# Patient Record
Sex: Female | Born: 1960 | Race: White | Hispanic: No | Marital: Married | Smoking: Current every day smoker
Health system: Southern US, Community
[De-identification: ages and names within clinical notes are randomized; demographics above are authoritative.]

## PROBLEM LIST (undated history)

## (undated) ENCOUNTER — Emergency Department (HOSPITAL_COMMUNITY): Payer: PRIVATE HEALTH INSURANCE | Source: Home / Self Care

## (undated) DIAGNOSIS — K219 Gastro-esophageal reflux disease without esophagitis: Secondary | ICD-10-CM

## (undated) DIAGNOSIS — T7840XA Allergy, unspecified, initial encounter: Secondary | ICD-10-CM

## (undated) DIAGNOSIS — J45909 Unspecified asthma, uncomplicated: Secondary | ICD-10-CM

## (undated) DIAGNOSIS — I890 Lymphedema, not elsewhere classified: Secondary | ICD-10-CM

## (undated) DIAGNOSIS — I10 Essential (primary) hypertension: Secondary | ICD-10-CM

## (undated) DIAGNOSIS — I96 Gangrene, not elsewhere classified: Secondary | ICD-10-CM

## (undated) DIAGNOSIS — C50919 Malignant neoplasm of unspecified site of unspecified female breast: Secondary | ICD-10-CM

## (undated) DIAGNOSIS — M8580 Other specified disorders of bone density and structure, unspecified site: Secondary | ICD-10-CM

## (undated) DIAGNOSIS — C7951 Secondary malignant neoplasm of bone: Secondary | ICD-10-CM

## (undated) HISTORY — PX: OTHER SURGICAL HISTORY: SHX169

## (undated) HISTORY — DX: Other specified disorders of bone density and structure, unspecified site: M85.80

## (undated) HISTORY — DX: Malignant neoplasm of unspecified site of unspecified female breast: C50.919

## (undated) HISTORY — DX: Essential (primary) hypertension: I10

## (undated) HISTORY — DX: Allergy, unspecified, initial encounter: T78.40XA

## (undated) HISTORY — DX: Gastro-esophageal reflux disease without esophagitis: K21.9

## (undated) HISTORY — DX: Lymphedema, not elsewhere classified: I89.0

## (undated) HISTORY — DX: Secondary malignant neoplasm of bone: C79.51

## (undated) HISTORY — PX: TUBAL LIGATION: SHX77

## (undated) HISTORY — PX: MOUTH SURGERY: SHX715

## (undated) HISTORY — DX: Gangrene, not elsewhere classified: I96

## (undated) HISTORY — DX: Unspecified asthma, uncomplicated: J45.909

## (undated) HISTORY — PX: WISDOM TOOTH EXTRACTION: SHX21

---

## 1996-12-30 HISTORY — PX: TUBAL LIGATION: SHX77

## 1998-12-04 ENCOUNTER — Ambulatory Visit (HOSPITAL_BASED_OUTPATIENT_CLINIC_OR_DEPARTMENT_OTHER): Admission: RE | Admit: 1998-12-04 | Discharge: 1998-12-04 | Payer: Self-pay | Admitting: Oral Surgery

## 2002-10-20 ENCOUNTER — Ambulatory Visit (HOSPITAL_COMMUNITY): Admission: RE | Admit: 2002-10-20 | Discharge: 2002-10-20 | Payer: Self-pay | Admitting: Pulmonary Disease

## 2003-03-10 ENCOUNTER — Ambulatory Visit (HOSPITAL_COMMUNITY): Admission: RE | Admit: 2003-03-10 | Discharge: 2003-03-10 | Payer: Self-pay | Admitting: Obstetrics & Gynecology

## 2004-12-30 HISTORY — PX: CARPAL TUNNEL RELEASE: SHX101

## 2011-02-21 ENCOUNTER — Other Ambulatory Visit: Payer: Self-pay

## 2011-02-21 DIAGNOSIS — Z139 Encounter for screening, unspecified: Secondary | ICD-10-CM

## 2011-03-07 ENCOUNTER — Ambulatory Visit (HOSPITAL_COMMUNITY)
Admission: RE | Admit: 2011-03-07 | Discharge: 2011-03-07 | Disposition: A | Discharge status: Home / Self Care | Payer: PRIVATE HEALTH INSURANCE | Source: Ambulatory Visit | Attending: Family Medicine | Admitting: Family Medicine

## 2011-03-07 DIAGNOSIS — Z139 Encounter for screening, unspecified: Secondary | ICD-10-CM

## 2011-03-07 DIAGNOSIS — Z1231 Encounter for screening mammogram for malignant neoplasm of breast: Secondary | ICD-10-CM | POA: Insufficient documentation

## 2012-06-08 ENCOUNTER — Encounter (HOSPITAL_COMMUNITY): Payer: Self-pay

## 2012-06-08 ENCOUNTER — Emergency Department (HOSPITAL_COMMUNITY)
Admission: EM | Admit: 2012-06-08 | Discharge: 2012-06-08 | Disposition: A | Discharge status: Home / Self Care | Payer: Managed Care, Other (non HMO) | Attending: Emergency Medicine | Admitting: Emergency Medicine

## 2012-06-08 DIAGNOSIS — F17200 Nicotine dependence, unspecified, uncomplicated: Secondary | ICD-10-CM | POA: Insufficient documentation

## 2012-06-08 DIAGNOSIS — G44209 Tension-type headache, unspecified, not intractable: Secondary | ICD-10-CM

## 2012-06-08 DIAGNOSIS — J45909 Unspecified asthma, uncomplicated: Secondary | ICD-10-CM | POA: Insufficient documentation

## 2012-06-08 DIAGNOSIS — R51 Headache: Secondary | ICD-10-CM | POA: Insufficient documentation

## 2012-06-08 DIAGNOSIS — I10 Essential (primary) hypertension: Secondary | ICD-10-CM | POA: Insufficient documentation

## 2012-06-08 HISTORY — DX: Unspecified asthma, uncomplicated: J45.909

## 2012-06-08 HISTORY — DX: Essential (primary) hypertension: I10

## 2012-06-08 LAB — BASIC METABOLIC PANEL
BUN: 13 mg/dL (ref 6–23)
CO2: 26 mEq/L (ref 19–32)
Calcium: 10.7 mg/dL — ABNORMAL HIGH (ref 8.4–10.5)
Chloride: 103 mEq/L (ref 96–112)
Creatinine, Ser: 0.68 mg/dL (ref 0.50–1.10)
GFR calc Af Amer: >90 mL/min (ref >90)
GFR calc non Af Amer: >90 mL/min (ref >90)
Glucose, Bld: 152 mg/dL — ABNORMAL HIGH (ref 70–99)
Potassium: 3.4 mEq/L — ABNORMAL LOW (ref 3.5–5.1)
Sodium: 142 mEq/L (ref 135–145)

## 2012-06-08 MED ORDER — HYDROCODONE-ACETAMINOPHEN 5-325 MG PO TABS: 2.0000 | ORAL_TABLET | ORAL | Status: AC | PRN

## 2012-06-08 NOTE — ED Provider Notes (Signed)
History  This chart was scribed for Wendy Baker, MD by Bennett Scrape. This patient was seen in room APA07/APA07 and the patient's care was started at 12:25PM.  CSN: [id number]  Arrival date & time 06/08/12  1201   First MD Initiated Contact with Patient 06/08/12 1225      Chief Complaint  Patient presents with  . Hypertension  . Headache    The history is provided by the patient. No language interpreter was used.    Wendy Zamora Ward is a 51 y.o. female with a h/o HTN who presents to the Emergency Department complaining of 2 weeks of gradual onset, gradually worsening, intermittently felt HTN symptoms described as HA that starts as neck pain and radiates up into the occiput with occasional associated blurred and spotty vision. She states that she has measured her BP in the 150s over 90s for the past 2 weeks. Pt states that the HA has been coming and going and is related to her BP being high. 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 She reports that the symptoms have been the most severe over the past 3 days which is why she came to this ED for evaluation today. She states that she has an appointment to see her PCP tomorrow. She is taking hydrochlorothiazide for HTN. She denies changes in medications or increased stress. She reports that she is trying to live a healthier lifestyle and has recently lost 22 lbs. She also has a h/o asthma. She is a current everyday smoker but denies alcohol use.  PCP is [facility].  Past Medical History  Diagnosis Date  . Hypertension   . Asthma     Past Surgical History  Procedure Date  . Tubal ligation   . Mouth surgery     No family history on file.  History  Substance Use Topics  . Smoking status: Current Everyday Smoker  . Smokeless tobacco: Not on file  . Alcohol Use: No    Review of Systems  Constitutional: Negative for fever.       10 Systems reviewed and are negative for acute change  except as noted in the HPI.  HENT: Positive for neck pain. Negative for rhinorrhea.   Eyes: Negative for discharge and redness.  Respiratory: Negative for cough and shortness of breath.   Cardiovascular: Negative for chest pain.  Gastrointestinal: Negative for vomiting, abdominal pain and diarrhea.  Genitourinary: Negative for dysuria.  Musculoskeletal: Negative for back pain.  Skin: Negative for rash.  Neurological: Positive for headaches. Negative for weakness and numbness.    Allergies  Ceftin; Lisinopril; Metoprolol; and Sulfa antibiotics  Home Medications  No current outpatient prescriptions on file.  Triage Vitals: BP 148/86  Pulse 94  Temp(Src) 98.2 F (36.8 C) (Oral)  Resp 18  Ht 5' 4.5" (1.638 m)  Wt 178 lb (80.74 kg)  BMI 30.08 kg/m2  SpO2 99%  LMP 06/08/2012  Physical Exam  Nursing note and vitals reviewed. Constitutional: She is oriented to person, place, and time. She appears well-developed and well-nourished. No distress.  HENT:  Head: Normocephalic and atraumatic.  Eyes: EOM are normal. Pupils are equal, round, and reactive to light.  Neck: Neck supple. No tracheal deviation present.  Cardiovascular: Normal rate.   Pulmonary/Chest: Effort normal. No respiratory distress.  Abdominal: Soft. She exhibits no distension.  Musculoskeletal: Normal range of motion. She exhibits no edema.  Neurological: She is alert and oriented to person, place, and  time. No sensory deficit.  Skin: Skin is warm and dry.  Psychiatric: She has a normal mood and affect. Her behavior is normal.    ED Course  Procedures (including critical care time)  DIAGNOSTIC STUDIES: Oxygen Saturation is 99% on room air, normal by my interpretation.    COORDINATION OF CARE: 12:31PM-Informed pt that BP is within normal range. Discussed treatment plan of blood work with pt and pt agreed to plan. Advised pt to follow up with PCP tomorrow for HTN symptoms.    Labs Reviewed - No data to  display No results found.   No diagnosis found.    MDM  Patient has appointment to be seen by her Dr. tomorrow for adjustment of her hypertensive medications. No concern for intracerebral hemorrhage at this time. No evidence of hypertensive emergency  I personally performed the services described in this documentation, which was scribed in my presence. The recorded information has been reviewed and considered.        Wendy Baker, MD 06/08/12 1346

## 2012-06-08 NOTE — Discharge Instructions (Signed)
Arterial Hypertension Arterial hypertension (high blood pressure) is a condition of elevated pressure in your blood vessels. Hypertension over a long period of time is a risk factor for strokes, heart attacks, and heart failure. It is also the leading cause of kidney (renal) failure.  CAUSES   In Adults -- Over 90% of all hypertension has no known cause. This is called essential or primary hypertension. In the other 10% of people with hypertension, the increase in blood pressure is caused by another disorder. This is called secondary hypertension. Important causes of secondary hypertension are:   Heavy alcohol use.   Obstructive sleep apnea.   Hyperaldosterosim (Conn's syndrome).   Steroid use.   Chronic kidney failure.   Hyperparathyroidism.   Medications.   Renal artery stenosis.   Pheochromocytoma.   Cushing's disease.   Coarctation of the aorta.   Scleroderma renal crisis.   Licorice (in excessive amounts).   Drugs (cocaine, methamphetamine).  Your caregiver can explain any items above that apply to you.  In Children -- Secondary hypertension is more common and should always be considered.   Pregnancy -- Few women of childbearing age have high blood pressure. However, up to 10% of them develop hypertension of pregnancy. Generally, this will not harm the woman. It Even be a sign of 3 complications of pregnancy: preeclampsia, HELLP syndrome, and eclampsia. Follow up and control with medication is necessary.  SYMPTOMS   This condition normally does not produce any noticeable symptoms. It is usually found during a routine exam.   Malignant hypertension is a late problem of high blood pressure. It Monger have the following symptoms:   Headaches.   Blurred vision.   End-organ damage (this means your kidneys, heart, lungs, and other organs are being damaged).   Stressful situations can increase the blood pressure. If a person with normal blood pressure has their blood  pressure go up while being seen by their caregiver, this is often termed "white coat hypertension." Its importance is not known. It Alkire be related with eventually developing hypertension or complications of hypertension.   Hypertension is often confused with mental tension, stress, and anxiety.  DIAGNOSIS  The diagnosis is made by 3 separate blood pressure measurements. They are taken at least 1 week apart from each other. If there is organ damage from hypertension, the diagnosis Lemire be made without repeat measurements. Hypertension is usually identified by having blood pressure readings:  Above 140/90 mmHg measured in both arms, at 3 separate times, over a couple weeks.   Over 130/80 mmHg should be considered a risk factor and Grisanti require treatment in patients with diabetes.  Blood pressure readings over 120/80 mmHg are called "pre-hypertension" even in non-diabetic patients. To get a true blood pressure measurement, use the following guidelines. Be aware of the factors that can alter blood pressure readings.  Take measurements at least 1 hour after caffeine.   Take measurements 30 minutes after smoking and without any stress. This is another reason to quit smoking - it raises your blood pressure.   Use a proper cuff size. Ask your caregiver if you are not sure about your cuff size.   Most home blood pressure cuffs are automatic. They will measure systolic and diastolic pressures. The systolic pressure is the pressure reading at the start of sounds. Diastolic pressure is the pressure at which the sounds disappear. If you are elderly, measure pressures in multiple postures. Try sitting, lying or standing.   Sit at rest for a minimum of   5 minutes before taking measurements.   You should not be on any medications like decongestants. These are found in many cold medications.   Record your blood pressure readings and review them with your caregiver.  If you have hypertension:  Your caregiver  may do tests to be sure you do not have secondary hypertension (see "causes" above).   Your caregiver may also look for signs of metabolic syndrome. This is also called Syndrome X or Insulin Resistance Syndrome. You may have this syndrome if you have type 2 diabetes, abdominal obesity, and abnormal blood lipids in addition to hypertension.   Your caregiver will take your medical and family history and perform a physical exam.   Diagnostic tests may include blood tests (for glucose, cholesterol, potassium, and kidney function), a urinalysis, or an EKG. Other tests may also be necessary depending on your condition.  PREVENTION  There are important lifestyle issues that you can adopt to reduce your chance of developing hypertension:  Maintain a normal weight.   Limit the amount of salt (sodium) in your diet.   Exercise often.   Limit alcohol intake.   Get enough potassium in your diet. Discuss specific advice with your caregiver.   Follow a DASH diet (dietary approaches to stop hypertension). This diet is rich in fruits, vegetables, and low-fat dairy products, and avoids certain fats.  PROGNOSIS  Essential hypertension cannot be cured. Lifestyle changes and medical treatment can lower blood pressure and reduce complications. The prognosis of secondary hypertension depends on the underlying cause. Many people whose hypertension is controlled with medicine or lifestyle changes can live a normal, healthy life.  RISKS AND COMPLICATIONS  While high blood pressure alone is not an illness, it often requires treatment due to its short- and long-term effects on many organs. Hypertension increases your risk for:  CVAs or strokes (cerebrovascular accident).   Heart failure due to chronically high blood pressure (hypertensive cardiomyopathy).   Heart attack (myocardial infarction).   Damage to the retina (hypertensive retinopathy).   Kidney failure (hypertensive nephropathy).  Your caregiver can  explain list items above that apply to you. Treatment of hypertension can significantly reduce the risk of complications. TREATMENT   For overweight patients, weight loss and regular exercise are recommended. Physical fitness lowers blood pressure.   Mild hypertension is usually treated with diet and exercise. A diet rich in fruits and vegetables, fat-free dairy products, and foods low in fat and salt (sodium) can help lower blood pressure. Decreasing salt intake decreases blood pressure in a 1/3 of people.   Stop smoking if you are a smoker.  The steps above are highly effective in reducing blood pressure. While these actions are easy to suggest, they are difficult to achieve. Most patients with moderate or severe hypertension end up requiring medications to bring their blood pressure down to a normal level. There are several classes of medications for treatment. Blood pressure pills (antihypertensives) will lower blood pressure by their different actions. Lowering the blood pressure by 10 mmHg may decrease the risk of complications by as much as 25%. The goal of treatment is effective blood pressure control. This will reduce your risk for complications. Your caregiver will help you determine the best treatment for you according to your lifestyle. What is excellent treatment for one person, may not be for you. HOME CARE INSTRUCTIONS   Do not smoke.   Follow the lifestyle changes outlined in the "Prevention" section.   If you are on medications, follow the directions   carefully. Blood pressure medications must be taken as prescribed. Skipping doses reduces their benefit. It also puts you at risk for problems.   Follow up with your caregiver, as directed.   If you are asked to monitor your blood pressure at home, follow the guidelines in the "Diagnosis" section above.  SEEK MEDICAL CARE IF:   You think you are having medication side effects.   You have recurrent headaches or lightheadedness.     You have swelling in your ankles.   You have trouble with your vision.  SEEK IMMEDIATE MEDICAL CARE IF:   You have sudden onset of chest pain or pressure, difficulty breathing, or other symptoms of a heart attack.   You have a severe headache.   You have symptoms of a stroke (such as sudden weakness, difficulty speaking, difficulty walking).  MAKE SURE YOU:   Understand these instructions.   Will watch your condition.   Will get help right away if you are not doing well or get worse.  Document Released: 12/16/2005 Document Revised: 12/05/2011 Document Reviewed: 07/16/2007 [facility] Patient Information 2012 [place], [place].Tension Headache (Muscle Contraction Headache) Tension headache is one of the most common causes of head pain. These headaches are usually felt as a pain over the top of your head and back of your neck. Stress, anxiety, and depression are common triggers for these headaches. Tension headaches are not life-threatening and will not lead to other types of headaches. Tension headaches can often be diagnosed by taking a history from the patient and a physical exam. Sometimes, further lab and x-ray studies are used to confirm the diagnosis. Your caregiver can advise you on how to get help solving problems that cause anxiety or stress. Antidepressants can be prescribed if depression is a problem. HOME CARE INSTRUCTIONS   If testing was done, call for your results. Remember, it is your responsibility to get the results of all testing. Do not assume everything is fine because you do not hear from your caregiver.   Only take over-the-counter or prescription medicines for pain, discomfort, or fever as directed by your caregiver.   Biofeedback, massage, or other relaxation techniques may be helpful.   Ice packs or heat to the head and neck can be used. Use these three to four times per day or as needed.   Physical therapy may be a useful addition to treatment.   If headaches  continue, even with therapy, you may need to think about lifestyle changes.   Avoid excessive use of pain killers, as rebound headaches can occur.  SEEK MEDICAL CARE IF:   You develop problems with medications prescribed.   You do not respond or get no relief from medications.   You have a change from the usual headache.   You develop nausea (feeling sick to your stomach) or vomiting.  SEEK IMMEDIATE MEDICAL CARE IF:   Your headache becomes severe.   You have an unexplained oral temperature above 102 F (38.9 C).   You develop a stiff neck.   You have loss of vision.   You have muscular weakness.   You have loss of muscular control.   You develop severe symptoms different from your first symptoms.   You start losing your balance or have trouble walking.   You feel faint or pass out.  MAKE SURE YOU:   Understand these instructions.   Will watch your condition.   Will get help right away if you are not doing well or get worse.  Document  Released: 12/16/2005 Document Revised: 12/05/2011 Document Reviewed: 08/04/2008 [facility] Patient Information 2012 [place], [place].

## 2012-06-08 NOTE — ED Notes (Signed)
Pt c/o elevated B/P for a couple weeks. No complaints at this time. Sitting in chair in room in no acute distress.

## 2012-06-08 NOTE — ED Notes (Signed)
Pt reports bp has been elveated for the past couple of weeks.  Reports has been taking her medication as directed.  C/o headache x 2weeks.

## 2019-04-08 DIAGNOSIS — N6321 Unspecified lump in the left breast, upper outer quadrant: Secondary | ICD-10-CM | POA: Diagnosis not present

## 2019-04-08 DIAGNOSIS — Z803 Family history of malignant neoplasm of breast: Secondary | ICD-10-CM | POA: Diagnosis not present

## 2019-04-14 DIAGNOSIS — R590 Localized enlarged lymph nodes: Secondary | ICD-10-CM | POA: Diagnosis not present

## 2019-04-14 DIAGNOSIS — Z803 Family history of malignant neoplasm of breast: Secondary | ICD-10-CM | POA: Diagnosis not present

## 2019-04-14 DIAGNOSIS — R921 Mammographic calcification found on diagnostic imaging of breast: Secondary | ICD-10-CM | POA: Diagnosis not present

## 2019-04-14 DIAGNOSIS — R928 Other abnormal and inconclusive findings on diagnostic imaging of breast: Secondary | ICD-10-CM | POA: Diagnosis not present

## 2019-04-14 DIAGNOSIS — L988 Other specified disorders of the skin and subcutaneous tissue: Secondary | ICD-10-CM | POA: Diagnosis not present

## 2019-04-22 DIAGNOSIS — Z01812 Encounter for preprocedural laboratory examination: Secondary | ICD-10-CM | POA: Diagnosis not present

## 2019-04-22 DIAGNOSIS — Z01818 Encounter for other preprocedural examination: Secondary | ICD-10-CM | POA: Diagnosis not present

## 2019-04-22 DIAGNOSIS — C773 Secondary and unspecified malignant neoplasm of axilla and upper limb lymph nodes: Secondary | ICD-10-CM | POA: Diagnosis not present

## 2019-04-22 DIAGNOSIS — J984 Other disorders of lung: Secondary | ICD-10-CM | POA: Diagnosis not present

## 2019-04-22 DIAGNOSIS — C50412 Malignant neoplasm of upper-outer quadrant of left female breast: Secondary | ICD-10-CM | POA: Diagnosis not present

## 2019-04-22 DIAGNOSIS — C50919 Malignant neoplasm of unspecified site of unspecified female breast: Secondary | ICD-10-CM | POA: Diagnosis not present

## 2019-04-23 DIAGNOSIS — C50412 Malignant neoplasm of upper-outer quadrant of left female breast: Secondary | ICD-10-CM | POA: Diagnosis not present

## 2019-04-23 DIAGNOSIS — C773 Secondary and unspecified malignant neoplasm of axilla and upper limb lymph nodes: Secondary | ICD-10-CM | POA: Diagnosis not present

## 2019-04-23 DIAGNOSIS — Z803 Family history of malignant neoplasm of breast: Secondary | ICD-10-CM | POA: Diagnosis not present

## 2019-04-26 DIAGNOSIS — C50912 Malignant neoplasm of unspecified site of left female breast: Secondary | ICD-10-CM | POA: Diagnosis not present

## 2019-04-27 DIAGNOSIS — J8410 Pulmonary fibrosis, unspecified: Secondary | ICD-10-CM | POA: Diagnosis not present

## 2019-04-27 DIAGNOSIS — C50919 Malignant neoplasm of unspecified site of unspecified female breast: Secondary | ICD-10-CM | POA: Diagnosis not present

## 2019-04-27 DIAGNOSIS — C50912 Malignant neoplasm of unspecified site of left female breast: Secondary | ICD-10-CM | POA: Diagnosis not present

## 2019-04-28 DIAGNOSIS — Z79899 Other long term (current) drug therapy: Secondary | ICD-10-CM | POA: Diagnosis not present

## 2019-04-28 DIAGNOSIS — Z5181 Encounter for therapeutic drug level monitoring: Secondary | ICD-10-CM | POA: Diagnosis not present

## 2019-04-28 DIAGNOSIS — C50919 Malignant neoplasm of unspecified site of unspecified female breast: Secondary | ICD-10-CM | POA: Diagnosis not present

## 2019-04-30 DIAGNOSIS — C50912 Malignant neoplasm of unspecified site of left female breast: Secondary | ICD-10-CM | POA: Diagnosis not present

## 2019-05-05 DIAGNOSIS — Z452 Encounter for adjustment and management of vascular access device: Secondary | ICD-10-CM | POA: Diagnosis not present

## 2019-05-13 DIAGNOSIS — C50912 Malignant neoplasm of unspecified site of left female breast: Secondary | ICD-10-CM | POA: Diagnosis not present

## 2019-05-13 DIAGNOSIS — R000 Tachycardia, unspecified: Secondary | ICD-10-CM | POA: Diagnosis not present

## 2019-05-13 DIAGNOSIS — R0602 Shortness of breath: Secondary | ICD-10-CM | POA: Diagnosis not present

## 2019-05-19 DIAGNOSIS — C50912 Malignant neoplasm of unspecified site of left female breast: Secondary | ICD-10-CM | POA: Diagnosis not present

## 2019-06-02 DIAGNOSIS — C50912 Malignant neoplasm of unspecified site of left female breast: Secondary | ICD-10-CM | POA: Diagnosis not present

## 2019-06-17 DIAGNOSIS — C50912 Malignant neoplasm of unspecified site of left female breast: Secondary | ICD-10-CM | POA: Diagnosis not present

## 2019-07-01 DIAGNOSIS — C50912 Malignant neoplasm of unspecified site of left female breast: Secondary | ICD-10-CM | POA: Diagnosis not present

## 2019-07-01 DIAGNOSIS — K1379 Other lesions of oral mucosa: Secondary | ICD-10-CM | POA: Diagnosis not present

## 2019-07-08 DIAGNOSIS — C50912 Malignant neoplasm of unspecified site of left female breast: Secondary | ICD-10-CM | POA: Diagnosis not present

## 2019-07-15 DIAGNOSIS — C50912 Malignant neoplasm of unspecified site of left female breast: Secondary | ICD-10-CM | POA: Diagnosis not present

## 2019-07-22 DIAGNOSIS — M62838 Other muscle spasm: Secondary | ICD-10-CM | POA: Diagnosis not present

## 2019-07-22 DIAGNOSIS — G4709 Other insomnia: Secondary | ICD-10-CM | POA: Diagnosis not present

## 2019-07-22 DIAGNOSIS — C50912 Malignant neoplasm of unspecified site of left female breast: Secondary | ICD-10-CM | POA: Diagnosis not present

## 2019-07-29 DIAGNOSIS — C50912 Malignant neoplasm of unspecified site of left female breast: Secondary | ICD-10-CM | POA: Diagnosis not present

## 2019-08-02 DIAGNOSIS — C50912 Malignant neoplasm of unspecified site of left female breast: Secondary | ICD-10-CM | POA: Diagnosis not present

## 2019-08-02 DIAGNOSIS — Z803 Family history of malignant neoplasm of breast: Secondary | ICD-10-CM | POA: Diagnosis not present

## 2019-08-05 DIAGNOSIS — C50912 Malignant neoplasm of unspecified site of left female breast: Secondary | ICD-10-CM | POA: Diagnosis not present

## 2019-08-05 DIAGNOSIS — Z5111 Encounter for antineoplastic chemotherapy: Secondary | ICD-10-CM | POA: Diagnosis not present

## 2019-08-12 DIAGNOSIS — M62838 Other muscle spasm: Secondary | ICD-10-CM | POA: Diagnosis not present

## 2019-08-12 DIAGNOSIS — Z5111 Encounter for antineoplastic chemotherapy: Secondary | ICD-10-CM | POA: Diagnosis not present

## 2019-08-12 DIAGNOSIS — C50912 Malignant neoplasm of unspecified site of left female breast: Secondary | ICD-10-CM | POA: Diagnosis not present

## 2019-08-19 DIAGNOSIS — Z5111 Encounter for antineoplastic chemotherapy: Secondary | ICD-10-CM | POA: Diagnosis not present

## 2019-08-19 DIAGNOSIS — C50912 Malignant neoplasm of unspecified site of left female breast: Secondary | ICD-10-CM | POA: Diagnosis not present

## 2019-08-26 DIAGNOSIS — Z5111 Encounter for antineoplastic chemotherapy: Secondary | ICD-10-CM | POA: Diagnosis not present

## 2019-08-26 DIAGNOSIS — C50912 Malignant neoplasm of unspecified site of left female breast: Secondary | ICD-10-CM | POA: Diagnosis not present

## 2019-09-02 DIAGNOSIS — C50912 Malignant neoplasm of unspecified site of left female breast: Secondary | ICD-10-CM | POA: Diagnosis not present

## 2019-09-08 DIAGNOSIS — C50412 Malignant neoplasm of upper-outer quadrant of left female breast: Secondary | ICD-10-CM | POA: Diagnosis not present

## 2019-09-08 DIAGNOSIS — Z803 Family history of malignant neoplasm of breast: Secondary | ICD-10-CM | POA: Diagnosis not present

## 2019-09-08 DIAGNOSIS — C773 Secondary and unspecified malignant neoplasm of axilla and upper limb lymph nodes: Secondary | ICD-10-CM | POA: Diagnosis not present

## 2019-09-08 DIAGNOSIS — I10 Essential (primary) hypertension: Secondary | ICD-10-CM | POA: Diagnosis not present

## 2019-09-08 DIAGNOSIS — C50912 Malignant neoplasm of unspecified site of left female breast: Secondary | ICD-10-CM | POA: Diagnosis not present

## 2019-09-08 DIAGNOSIS — C50812 Malignant neoplasm of overlapping sites of left female breast: Secondary | ICD-10-CM | POA: Diagnosis not present

## 2019-09-09 DIAGNOSIS — Z170 Estrogen receptor positive status [ER+]: Secondary | ICD-10-CM | POA: Diagnosis not present

## 2019-09-09 DIAGNOSIS — C50912 Malignant neoplasm of unspecified site of left female breast: Secondary | ICD-10-CM | POA: Diagnosis not present

## 2019-09-16 DIAGNOSIS — C50912 Malignant neoplasm of unspecified site of left female breast: Secondary | ICD-10-CM | POA: Diagnosis not present

## 2019-09-24 DIAGNOSIS — C50912 Malignant neoplasm of unspecified site of left female breast: Secondary | ICD-10-CM | POA: Diagnosis not present

## 2019-09-29 DIAGNOSIS — C50412 Malignant neoplasm of upper-outer quadrant of left female breast: Secondary | ICD-10-CM | POA: Diagnosis not present

## 2019-09-29 DIAGNOSIS — Z803 Family history of malignant neoplasm of breast: Secondary | ICD-10-CM | POA: Diagnosis not present

## 2019-09-29 DIAGNOSIS — C50812 Malignant neoplasm of overlapping sites of left female breast: Secondary | ICD-10-CM | POA: Diagnosis not present

## 2019-09-29 DIAGNOSIS — I10 Essential (primary) hypertension: Secondary | ICD-10-CM | POA: Diagnosis not present

## 2019-09-29 DIAGNOSIS — C773 Secondary and unspecified malignant neoplasm of axilla and upper limb lymph nodes: Secondary | ICD-10-CM | POA: Diagnosis not present

## 2019-10-08 DIAGNOSIS — C50912 Malignant neoplasm of unspecified site of left female breast: Secondary | ICD-10-CM | POA: Diagnosis not present

## 2019-10-09 DIAGNOSIS — Z23 Encounter for immunization: Secondary | ICD-10-CM | POA: Diagnosis not present

## 2019-10-14 DIAGNOSIS — R21 Rash and other nonspecific skin eruption: Secondary | ICD-10-CM | POA: Diagnosis not present

## 2019-10-14 DIAGNOSIS — G629 Polyneuropathy, unspecified: Secondary | ICD-10-CM | POA: Diagnosis not present

## 2019-10-14 DIAGNOSIS — Z452 Encounter for adjustment and management of vascular access device: Secondary | ICD-10-CM | POA: Diagnosis not present

## 2019-10-14 DIAGNOSIS — Z79899 Other long term (current) drug therapy: Secondary | ICD-10-CM | POA: Diagnosis not present

## 2019-10-14 DIAGNOSIS — C50912 Malignant neoplasm of unspecified site of left female breast: Secondary | ICD-10-CM | POA: Diagnosis not present

## 2019-10-14 DIAGNOSIS — R079 Chest pain, unspecified: Secondary | ICD-10-CM | POA: Diagnosis not present

## 2019-10-22 DIAGNOSIS — C50812 Malignant neoplasm of overlapping sites of left female breast: Secondary | ICD-10-CM | POA: Diagnosis not present

## 2019-10-22 DIAGNOSIS — C773 Secondary and unspecified malignant neoplasm of axilla and upper limb lymph nodes: Secondary | ICD-10-CM | POA: Diagnosis not present

## 2019-10-22 DIAGNOSIS — Z170 Estrogen receptor positive status [ER+]: Secondary | ICD-10-CM | POA: Diagnosis not present

## 2019-10-22 DIAGNOSIS — C50412 Malignant neoplasm of upper-outer quadrant of left female breast: Secondary | ICD-10-CM | POA: Diagnosis not present

## 2019-10-22 DIAGNOSIS — Z9013 Acquired absence of bilateral breasts and nipples: Secondary | ICD-10-CM

## 2019-10-22 DIAGNOSIS — Z9011 Acquired absence of right breast and nipple: Secondary | ICD-10-CM | POA: Diagnosis not present

## 2019-10-22 HISTORY — PX: OTHER SURGICAL HISTORY: SHX169

## 2019-10-22 HISTORY — DX: Acquired absence of bilateral breasts and nipples: Z90.13

## 2019-11-13 DIAGNOSIS — Z20828 Contact with and (suspected) exposure to other viral communicable diseases: Secondary | ICD-10-CM | POA: Diagnosis not present

## 2019-12-03 DIAGNOSIS — I96 Gangrene, not elsewhere classified: Secondary | ICD-10-CM | POA: Diagnosis not present

## 2019-12-08 DIAGNOSIS — C50912 Malignant neoplasm of unspecified site of left female breast: Secondary | ICD-10-CM | POA: Diagnosis not present

## 2019-12-09 DIAGNOSIS — C50912 Malignant neoplasm of unspecified site of left female breast: Secondary | ICD-10-CM | POA: Diagnosis not present

## 2019-12-10 DIAGNOSIS — T8579XA Infection and inflammatory reaction due to other internal prosthetic devices, implants and grafts, initial encounter: Secondary | ICD-10-CM | POA: Diagnosis not present

## 2019-12-17 DIAGNOSIS — Z853 Personal history of malignant neoplasm of breast: Secondary | ICD-10-CM | POA: Diagnosis not present

## 2019-12-17 DIAGNOSIS — Z0001 Encounter for general adult medical examination with abnormal findings: Secondary | ICD-10-CM | POA: Diagnosis not present

## 2019-12-17 DIAGNOSIS — I10 Essential (primary) hypertension: Secondary | ICD-10-CM | POA: Diagnosis not present

## 2020-01-05 DIAGNOSIS — Z9013 Acquired absence of bilateral breasts and nipples: Secondary | ICD-10-CM | POA: Insufficient documentation

## 2020-01-06 DIAGNOSIS — Z13820 Encounter for screening for osteoporosis: Secondary | ICD-10-CM | POA: Diagnosis not present

## 2020-01-06 DIAGNOSIS — M8588 Other specified disorders of bone density and structure, other site: Secondary | ICD-10-CM | POA: Diagnosis not present

## 2020-01-06 DIAGNOSIS — C50912 Malignant neoplasm of unspecified site of left female breast: Secondary | ICD-10-CM | POA: Diagnosis not present

## 2020-01-06 DIAGNOSIS — Z79899 Other long term (current) drug therapy: Secondary | ICD-10-CM | POA: Diagnosis not present

## 2020-01-13 DIAGNOSIS — C50912 Malignant neoplasm of unspecified site of left female breast: Secondary | ICD-10-CM | POA: Diagnosis not present

## 2020-01-20 DIAGNOSIS — C50919 Malignant neoplasm of unspecified site of unspecified female breast: Secondary | ICD-10-CM | POA: Diagnosis not present

## 2020-01-27 DIAGNOSIS — C50919 Malignant neoplasm of unspecified site of unspecified female breast: Secondary | ICD-10-CM | POA: Diagnosis not present

## 2020-02-02 DIAGNOSIS — C50812 Malignant neoplasm of overlapping sites of left female breast: Secondary | ICD-10-CM | POA: Diagnosis not present

## 2020-02-02 DIAGNOSIS — Z9013 Acquired absence of bilateral breasts and nipples: Secondary | ICD-10-CM | POA: Diagnosis not present

## 2020-02-02 DIAGNOSIS — C773 Secondary and unspecified malignant neoplasm of axilla and upper limb lymph nodes: Secondary | ICD-10-CM | POA: Diagnosis not present

## 2020-02-02 DIAGNOSIS — C50412 Malignant neoplasm of upper-outer quadrant of left female breast: Secondary | ICD-10-CM | POA: Diagnosis not present

## 2020-02-03 DIAGNOSIS — C50912 Malignant neoplasm of unspecified site of left female breast: Secondary | ICD-10-CM | POA: Diagnosis not present

## 2020-02-03 DIAGNOSIS — Z510 Encounter for antineoplastic radiation therapy: Secondary | ICD-10-CM | POA: Diagnosis not present

## 2020-02-03 DIAGNOSIS — I890 Lymphedema, not elsewhere classified: Secondary | ICD-10-CM | POA: Diagnosis not present

## 2020-02-17 DIAGNOSIS — C50012 Malignant neoplasm of nipple and areola, left female breast: Secondary | ICD-10-CM | POA: Diagnosis not present

## 2020-02-17 DIAGNOSIS — I972 Postmastectomy lymphedema syndrome: Secondary | ICD-10-CM | POA: Diagnosis not present

## 2020-02-17 DIAGNOSIS — C50011 Malignant neoplasm of nipple and areola, right female breast: Secondary | ICD-10-CM | POA: Diagnosis not present

## 2020-02-18 DIAGNOSIS — C50912 Malignant neoplasm of unspecified site of left female breast: Secondary | ICD-10-CM | POA: Diagnosis not present

## 2020-02-18 DIAGNOSIS — Z510 Encounter for antineoplastic radiation therapy: Secondary | ICD-10-CM | POA: Diagnosis not present

## 2020-02-22 DIAGNOSIS — C50912 Malignant neoplasm of unspecified site of left female breast: Secondary | ICD-10-CM | POA: Diagnosis not present

## 2020-02-22 DIAGNOSIS — Z510 Encounter for antineoplastic radiation therapy: Secondary | ICD-10-CM | POA: Diagnosis not present

## 2020-02-28 DIAGNOSIS — I890 Lymphedema, not elsewhere classified: Secondary | ICD-10-CM | POA: Diagnosis not present

## 2020-02-28 DIAGNOSIS — Z170 Estrogen receptor positive status [ER+]: Secondary | ICD-10-CM | POA: Diagnosis not present

## 2020-02-28 DIAGNOSIS — C50912 Malignant neoplasm of unspecified site of left female breast: Secondary | ICD-10-CM | POA: Diagnosis not present

## 2020-02-28 DIAGNOSIS — Z79899 Other long term (current) drug therapy: Secondary | ICD-10-CM | POA: Diagnosis not present

## 2020-02-28 DIAGNOSIS — Z510 Encounter for antineoplastic radiation therapy: Secondary | ICD-10-CM | POA: Diagnosis not present

## 2020-02-29 DIAGNOSIS — C50912 Malignant neoplasm of unspecified site of left female breast: Secondary | ICD-10-CM | POA: Diagnosis not present

## 2020-02-29 DIAGNOSIS — Z510 Encounter for antineoplastic radiation therapy: Secondary | ICD-10-CM | POA: Diagnosis not present

## 2020-02-29 DIAGNOSIS — Z79899 Other long term (current) drug therapy: Secondary | ICD-10-CM | POA: Diagnosis not present

## 2020-02-29 DIAGNOSIS — I890 Lymphedema, not elsewhere classified: Secondary | ICD-10-CM | POA: Diagnosis not present

## 2020-02-29 DIAGNOSIS — Z170 Estrogen receptor positive status [ER+]: Secondary | ICD-10-CM | POA: Diagnosis not present

## 2020-03-01 DIAGNOSIS — I890 Lymphedema, not elsewhere classified: Secondary | ICD-10-CM | POA: Diagnosis not present

## 2020-03-01 DIAGNOSIS — Z510 Encounter for antineoplastic radiation therapy: Secondary | ICD-10-CM | POA: Diagnosis not present

## 2020-03-01 DIAGNOSIS — Z79899 Other long term (current) drug therapy: Secondary | ICD-10-CM | POA: Diagnosis not present

## 2020-03-01 DIAGNOSIS — C50912 Malignant neoplasm of unspecified site of left female breast: Secondary | ICD-10-CM | POA: Diagnosis not present

## 2020-03-01 DIAGNOSIS — Z170 Estrogen receptor positive status [ER+]: Secondary | ICD-10-CM | POA: Diagnosis not present

## 2020-03-02 DIAGNOSIS — C50912 Malignant neoplasm of unspecified site of left female breast: Secondary | ICD-10-CM | POA: Diagnosis not present

## 2020-03-02 DIAGNOSIS — I890 Lymphedema, not elsewhere classified: Secondary | ICD-10-CM | POA: Diagnosis not present

## 2020-03-02 DIAGNOSIS — Z79899 Other long term (current) drug therapy: Secondary | ICD-10-CM | POA: Diagnosis not present

## 2020-03-02 DIAGNOSIS — Z170 Estrogen receptor positive status [ER+]: Secondary | ICD-10-CM | POA: Diagnosis not present

## 2020-03-02 DIAGNOSIS — Z510 Encounter for antineoplastic radiation therapy: Secondary | ICD-10-CM | POA: Diagnosis not present

## 2020-03-03 DIAGNOSIS — Z170 Estrogen receptor positive status [ER+]: Secondary | ICD-10-CM | POA: Diagnosis not present

## 2020-03-03 DIAGNOSIS — Z510 Encounter for antineoplastic radiation therapy: Secondary | ICD-10-CM | POA: Diagnosis not present

## 2020-03-03 DIAGNOSIS — C50912 Malignant neoplasm of unspecified site of left female breast: Secondary | ICD-10-CM | POA: Diagnosis not present

## 2020-03-03 DIAGNOSIS — Z79899 Other long term (current) drug therapy: Secondary | ICD-10-CM | POA: Diagnosis not present

## 2020-03-03 DIAGNOSIS — I890 Lymphedema, not elsewhere classified: Secondary | ICD-10-CM | POA: Diagnosis not present

## 2020-03-06 DIAGNOSIS — Z79899 Other long term (current) drug therapy: Secondary | ICD-10-CM | POA: Diagnosis not present

## 2020-03-06 DIAGNOSIS — I890 Lymphedema, not elsewhere classified: Secondary | ICD-10-CM | POA: Diagnosis not present

## 2020-03-06 DIAGNOSIS — Z170 Estrogen receptor positive status [ER+]: Secondary | ICD-10-CM | POA: Diagnosis not present

## 2020-03-06 DIAGNOSIS — C50912 Malignant neoplasm of unspecified site of left female breast: Secondary | ICD-10-CM | POA: Diagnosis not present

## 2020-03-06 DIAGNOSIS — Z510 Encounter for antineoplastic radiation therapy: Secondary | ICD-10-CM | POA: Diagnosis not present

## 2020-03-07 DIAGNOSIS — I890 Lymphedema, not elsewhere classified: Secondary | ICD-10-CM | POA: Diagnosis not present

## 2020-03-07 DIAGNOSIS — Z79899 Other long term (current) drug therapy: Secondary | ICD-10-CM | POA: Diagnosis not present

## 2020-03-07 DIAGNOSIS — Z510 Encounter for antineoplastic radiation therapy: Secondary | ICD-10-CM | POA: Diagnosis not present

## 2020-03-07 DIAGNOSIS — C50912 Malignant neoplasm of unspecified site of left female breast: Secondary | ICD-10-CM | POA: Diagnosis not present

## 2020-03-07 DIAGNOSIS — Z170 Estrogen receptor positive status [ER+]: Secondary | ICD-10-CM | POA: Diagnosis not present

## 2020-03-08 DIAGNOSIS — Z170 Estrogen receptor positive status [ER+]: Secondary | ICD-10-CM | POA: Diagnosis not present

## 2020-03-08 DIAGNOSIS — I890 Lymphedema, not elsewhere classified: Secondary | ICD-10-CM | POA: Diagnosis not present

## 2020-03-08 DIAGNOSIS — Z510 Encounter for antineoplastic radiation therapy: Secondary | ICD-10-CM | POA: Diagnosis not present

## 2020-03-08 DIAGNOSIS — Z79899 Other long term (current) drug therapy: Secondary | ICD-10-CM | POA: Diagnosis not present

## 2020-03-08 DIAGNOSIS — C50912 Malignant neoplasm of unspecified site of left female breast: Secondary | ICD-10-CM | POA: Diagnosis not present

## 2020-03-09 DIAGNOSIS — Z170 Estrogen receptor positive status [ER+]: Secondary | ICD-10-CM | POA: Diagnosis not present

## 2020-03-09 DIAGNOSIS — I890 Lymphedema, not elsewhere classified: Secondary | ICD-10-CM | POA: Diagnosis not present

## 2020-03-09 DIAGNOSIS — Z79899 Other long term (current) drug therapy: Secondary | ICD-10-CM | POA: Diagnosis not present

## 2020-03-09 DIAGNOSIS — C50912 Malignant neoplasm of unspecified site of left female breast: Secondary | ICD-10-CM | POA: Diagnosis not present

## 2020-03-09 DIAGNOSIS — Z510 Encounter for antineoplastic radiation therapy: Secondary | ICD-10-CM | POA: Diagnosis not present

## 2020-03-10 DIAGNOSIS — Z170 Estrogen receptor positive status [ER+]: Secondary | ICD-10-CM | POA: Diagnosis not present

## 2020-03-10 DIAGNOSIS — C50912 Malignant neoplasm of unspecified site of left female breast: Secondary | ICD-10-CM | POA: Diagnosis not present

## 2020-03-10 DIAGNOSIS — I890 Lymphedema, not elsewhere classified: Secondary | ICD-10-CM | POA: Diagnosis not present

## 2020-03-10 DIAGNOSIS — Z510 Encounter for antineoplastic radiation therapy: Secondary | ICD-10-CM | POA: Diagnosis not present

## 2020-03-10 DIAGNOSIS — Z79899 Other long term (current) drug therapy: Secondary | ICD-10-CM | POA: Diagnosis not present

## 2020-03-13 DIAGNOSIS — C50912 Malignant neoplasm of unspecified site of left female breast: Secondary | ICD-10-CM | POA: Diagnosis not present

## 2020-03-13 DIAGNOSIS — Z79899 Other long term (current) drug therapy: Secondary | ICD-10-CM | POA: Diagnosis not present

## 2020-03-13 DIAGNOSIS — Z170 Estrogen receptor positive status [ER+]: Secondary | ICD-10-CM | POA: Diagnosis not present

## 2020-03-13 DIAGNOSIS — I890 Lymphedema, not elsewhere classified: Secondary | ICD-10-CM | POA: Diagnosis not present

## 2020-03-13 DIAGNOSIS — Z510 Encounter for antineoplastic radiation therapy: Secondary | ICD-10-CM | POA: Diagnosis not present

## 2020-03-14 DIAGNOSIS — I890 Lymphedema, not elsewhere classified: Secondary | ICD-10-CM | POA: Diagnosis not present

## 2020-03-14 DIAGNOSIS — Z79899 Other long term (current) drug therapy: Secondary | ICD-10-CM | POA: Diagnosis not present

## 2020-03-14 DIAGNOSIS — Z510 Encounter for antineoplastic radiation therapy: Secondary | ICD-10-CM | POA: Diagnosis not present

## 2020-03-14 DIAGNOSIS — Z170 Estrogen receptor positive status [ER+]: Secondary | ICD-10-CM | POA: Diagnosis not present

## 2020-03-14 DIAGNOSIS — C50912 Malignant neoplasm of unspecified site of left female breast: Secondary | ICD-10-CM | POA: Diagnosis not present

## 2020-03-15 DIAGNOSIS — C50912 Malignant neoplasm of unspecified site of left female breast: Secondary | ICD-10-CM | POA: Diagnosis not present

## 2020-03-15 DIAGNOSIS — Z170 Estrogen receptor positive status [ER+]: Secondary | ICD-10-CM | POA: Diagnosis not present

## 2020-03-15 DIAGNOSIS — Z79899 Other long term (current) drug therapy: Secondary | ICD-10-CM | POA: Diagnosis not present

## 2020-03-15 DIAGNOSIS — Z510 Encounter for antineoplastic radiation therapy: Secondary | ICD-10-CM | POA: Diagnosis not present

## 2020-03-15 DIAGNOSIS — I890 Lymphedema, not elsewhere classified: Secondary | ICD-10-CM | POA: Diagnosis not present

## 2020-03-16 DIAGNOSIS — Z510 Encounter for antineoplastic radiation therapy: Secondary | ICD-10-CM | POA: Diagnosis not present

## 2020-03-16 DIAGNOSIS — I890 Lymphedema, not elsewhere classified: Secondary | ICD-10-CM | POA: Diagnosis not present

## 2020-03-16 DIAGNOSIS — Z170 Estrogen receptor positive status [ER+]: Secondary | ICD-10-CM | POA: Diagnosis not present

## 2020-03-16 DIAGNOSIS — Z79899 Other long term (current) drug therapy: Secondary | ICD-10-CM | POA: Diagnosis not present

## 2020-03-16 DIAGNOSIS — C50912 Malignant neoplasm of unspecified site of left female breast: Secondary | ICD-10-CM | POA: Diagnosis not present

## 2020-03-17 DIAGNOSIS — Z170 Estrogen receptor positive status [ER+]: Secondary | ICD-10-CM | POA: Diagnosis not present

## 2020-03-17 DIAGNOSIS — Z79899 Other long term (current) drug therapy: Secondary | ICD-10-CM | POA: Diagnosis not present

## 2020-03-17 DIAGNOSIS — Z510 Encounter for antineoplastic radiation therapy: Secondary | ICD-10-CM | POA: Diagnosis not present

## 2020-03-17 DIAGNOSIS — I890 Lymphedema, not elsewhere classified: Secondary | ICD-10-CM | POA: Diagnosis not present

## 2020-03-17 DIAGNOSIS — C50912 Malignant neoplasm of unspecified site of left female breast: Secondary | ICD-10-CM | POA: Diagnosis not present

## 2020-03-20 DIAGNOSIS — C50912 Malignant neoplasm of unspecified site of left female breast: Secondary | ICD-10-CM | POA: Diagnosis not present

## 2020-03-20 DIAGNOSIS — Z79899 Other long term (current) drug therapy: Secondary | ICD-10-CM | POA: Diagnosis not present

## 2020-03-20 DIAGNOSIS — Z170 Estrogen receptor positive status [ER+]: Secondary | ICD-10-CM | POA: Diagnosis not present

## 2020-03-20 DIAGNOSIS — Z510 Encounter for antineoplastic radiation therapy: Secondary | ICD-10-CM | POA: Diagnosis not present

## 2020-03-20 DIAGNOSIS — I890 Lymphedema, not elsewhere classified: Secondary | ICD-10-CM | POA: Diagnosis not present

## 2020-03-21 DIAGNOSIS — Z510 Encounter for antineoplastic radiation therapy: Secondary | ICD-10-CM | POA: Diagnosis not present

## 2020-03-21 DIAGNOSIS — Z170 Estrogen receptor positive status [ER+]: Secondary | ICD-10-CM | POA: Diagnosis not present

## 2020-03-21 DIAGNOSIS — I890 Lymphedema, not elsewhere classified: Secondary | ICD-10-CM | POA: Diagnosis not present

## 2020-03-21 DIAGNOSIS — Z79899 Other long term (current) drug therapy: Secondary | ICD-10-CM | POA: Diagnosis not present

## 2020-03-21 DIAGNOSIS — C50912 Malignant neoplasm of unspecified site of left female breast: Secondary | ICD-10-CM | POA: Diagnosis not present

## 2020-03-22 DIAGNOSIS — Z170 Estrogen receptor positive status [ER+]: Secondary | ICD-10-CM | POA: Diagnosis not present

## 2020-03-22 DIAGNOSIS — I890 Lymphedema, not elsewhere classified: Secondary | ICD-10-CM | POA: Diagnosis not present

## 2020-03-22 DIAGNOSIS — C50912 Malignant neoplasm of unspecified site of left female breast: Secondary | ICD-10-CM | POA: Diagnosis not present

## 2020-03-22 DIAGNOSIS — Z510 Encounter for antineoplastic radiation therapy: Secondary | ICD-10-CM | POA: Diagnosis not present

## 2020-03-22 DIAGNOSIS — Z79899 Other long term (current) drug therapy: Secondary | ICD-10-CM | POA: Diagnosis not present

## 2020-03-23 DIAGNOSIS — I890 Lymphedema, not elsewhere classified: Secondary | ICD-10-CM | POA: Diagnosis not present

## 2020-03-23 DIAGNOSIS — Z79899 Other long term (current) drug therapy: Secondary | ICD-10-CM | POA: Diagnosis not present

## 2020-03-23 DIAGNOSIS — Z510 Encounter for antineoplastic radiation therapy: Secondary | ICD-10-CM | POA: Diagnosis not present

## 2020-03-23 DIAGNOSIS — Z170 Estrogen receptor positive status [ER+]: Secondary | ICD-10-CM | POA: Diagnosis not present

## 2020-03-23 DIAGNOSIS — C50912 Malignant neoplasm of unspecified site of left female breast: Secondary | ICD-10-CM | POA: Diagnosis not present

## 2020-03-25 DIAGNOSIS — Z510 Encounter for antineoplastic radiation therapy: Secondary | ICD-10-CM | POA: Diagnosis not present

## 2020-03-25 DIAGNOSIS — I890 Lymphedema, not elsewhere classified: Secondary | ICD-10-CM | POA: Diagnosis not present

## 2020-03-25 DIAGNOSIS — Z79899 Other long term (current) drug therapy: Secondary | ICD-10-CM | POA: Diagnosis not present

## 2020-03-25 DIAGNOSIS — Z170 Estrogen receptor positive status [ER+]: Secondary | ICD-10-CM | POA: Diagnosis not present

## 2020-03-25 DIAGNOSIS — C50912 Malignant neoplasm of unspecified site of left female breast: Secondary | ICD-10-CM | POA: Diagnosis not present

## 2020-03-27 DIAGNOSIS — Z79899 Other long term (current) drug therapy: Secondary | ICD-10-CM | POA: Diagnosis not present

## 2020-03-27 DIAGNOSIS — C50912 Malignant neoplasm of unspecified site of left female breast: Secondary | ICD-10-CM | POA: Diagnosis not present

## 2020-03-27 DIAGNOSIS — Z510 Encounter for antineoplastic radiation therapy: Secondary | ICD-10-CM | POA: Diagnosis not present

## 2020-03-27 DIAGNOSIS — I890 Lymphedema, not elsewhere classified: Secondary | ICD-10-CM | POA: Diagnosis not present

## 2020-03-27 DIAGNOSIS — Z170 Estrogen receptor positive status [ER+]: Secondary | ICD-10-CM | POA: Diagnosis not present

## 2020-03-28 DIAGNOSIS — Z170 Estrogen receptor positive status [ER+]: Secondary | ICD-10-CM | POA: Diagnosis not present

## 2020-03-28 DIAGNOSIS — I890 Lymphedema, not elsewhere classified: Secondary | ICD-10-CM | POA: Diagnosis not present

## 2020-03-28 DIAGNOSIS — Z79899 Other long term (current) drug therapy: Secondary | ICD-10-CM | POA: Diagnosis not present

## 2020-03-28 DIAGNOSIS — C50912 Malignant neoplasm of unspecified site of left female breast: Secondary | ICD-10-CM | POA: Diagnosis not present

## 2020-03-28 DIAGNOSIS — Z510 Encounter for antineoplastic radiation therapy: Secondary | ICD-10-CM | POA: Diagnosis not present

## 2020-03-29 DIAGNOSIS — C50912 Malignant neoplasm of unspecified site of left female breast: Secondary | ICD-10-CM | POA: Diagnosis not present

## 2020-03-29 DIAGNOSIS — Z510 Encounter for antineoplastic radiation therapy: Secondary | ICD-10-CM | POA: Diagnosis not present

## 2020-03-29 DIAGNOSIS — Z79899 Other long term (current) drug therapy: Secondary | ICD-10-CM | POA: Diagnosis not present

## 2020-03-29 DIAGNOSIS — Z170 Estrogen receptor positive status [ER+]: Secondary | ICD-10-CM | POA: Diagnosis not present

## 2020-03-29 DIAGNOSIS — I890 Lymphedema, not elsewhere classified: Secondary | ICD-10-CM | POA: Diagnosis not present

## 2020-03-30 DIAGNOSIS — C50912 Malignant neoplasm of unspecified site of left female breast: Secondary | ICD-10-CM | POA: Diagnosis not present

## 2020-03-30 DIAGNOSIS — Z79899 Other long term (current) drug therapy: Secondary | ICD-10-CM | POA: Diagnosis not present

## 2020-03-30 DIAGNOSIS — Z170 Estrogen receptor positive status [ER+]: Secondary | ICD-10-CM | POA: Diagnosis not present

## 2020-04-03 DIAGNOSIS — Z170 Estrogen receptor positive status [ER+]: Secondary | ICD-10-CM | POA: Diagnosis not present

## 2020-04-03 DIAGNOSIS — Z79899 Other long term (current) drug therapy: Secondary | ICD-10-CM | POA: Diagnosis not present

## 2020-04-03 DIAGNOSIS — C50912 Malignant neoplasm of unspecified site of left female breast: Secondary | ICD-10-CM | POA: Diagnosis not present

## 2020-04-04 DIAGNOSIS — Z170 Estrogen receptor positive status [ER+]: Secondary | ICD-10-CM | POA: Diagnosis not present

## 2020-04-04 DIAGNOSIS — Z79899 Other long term (current) drug therapy: Secondary | ICD-10-CM | POA: Diagnosis not present

## 2020-04-04 DIAGNOSIS — C50912 Malignant neoplasm of unspecified site of left female breast: Secondary | ICD-10-CM | POA: Diagnosis not present

## 2020-04-06 DIAGNOSIS — Z79899 Other long term (current) drug therapy: Secondary | ICD-10-CM | POA: Diagnosis not present

## 2020-04-06 DIAGNOSIS — Z170 Estrogen receptor positive status [ER+]: Secondary | ICD-10-CM | POA: Diagnosis not present

## 2020-04-06 DIAGNOSIS — C50912 Malignant neoplasm of unspecified site of left female breast: Secondary | ICD-10-CM | POA: Diagnosis not present

## 2020-04-07 DIAGNOSIS — Z170 Estrogen receptor positive status [ER+]: Secondary | ICD-10-CM | POA: Diagnosis not present

## 2020-04-07 DIAGNOSIS — C50912 Malignant neoplasm of unspecified site of left female breast: Secondary | ICD-10-CM | POA: Diagnosis not present

## 2020-04-07 DIAGNOSIS — Z79899 Other long term (current) drug therapy: Secondary | ICD-10-CM | POA: Diagnosis not present

## 2020-04-10 DIAGNOSIS — Z170 Estrogen receptor positive status [ER+]: Secondary | ICD-10-CM | POA: Diagnosis not present

## 2020-04-10 DIAGNOSIS — Z79899 Other long term (current) drug therapy: Secondary | ICD-10-CM | POA: Diagnosis not present

## 2020-04-10 DIAGNOSIS — C50912 Malignant neoplasm of unspecified site of left female breast: Secondary | ICD-10-CM | POA: Diagnosis not present

## 2020-04-11 DIAGNOSIS — Z79899 Other long term (current) drug therapy: Secondary | ICD-10-CM | POA: Diagnosis not present

## 2020-04-11 DIAGNOSIS — Z170 Estrogen receptor positive status [ER+]: Secondary | ICD-10-CM | POA: Diagnosis not present

## 2020-04-11 DIAGNOSIS — C50912 Malignant neoplasm of unspecified site of left female breast: Secondary | ICD-10-CM | POA: Diagnosis not present

## 2020-04-12 DIAGNOSIS — Z170 Estrogen receptor positive status [ER+]: Secondary | ICD-10-CM | POA: Diagnosis not present

## 2020-04-12 DIAGNOSIS — C50912 Malignant neoplasm of unspecified site of left female breast: Secondary | ICD-10-CM | POA: Diagnosis not present

## 2020-04-12 DIAGNOSIS — Z79899 Other long term (current) drug therapy: Secondary | ICD-10-CM | POA: Diagnosis not present

## 2020-04-20 DIAGNOSIS — B029 Zoster without complications: Secondary | ICD-10-CM

## 2020-04-20 HISTORY — DX: Zoster without complications: B02.9

## 2020-04-24 DIAGNOSIS — B029 Zoster without complications: Secondary | ICD-10-CM | POA: Diagnosis not present

## 2020-05-01 DIAGNOSIS — C50912 Malignant neoplasm of unspecified site of left female breast: Secondary | ICD-10-CM | POA: Diagnosis not present

## 2020-05-01 DIAGNOSIS — Z79899 Other long term (current) drug therapy: Secondary | ICD-10-CM | POA: Diagnosis not present

## 2020-05-04 DIAGNOSIS — C50912 Malignant neoplasm of unspecified site of left female breast: Secondary | ICD-10-CM | POA: Diagnosis not present

## 2020-05-04 DIAGNOSIS — Z79899 Other long term (current) drug therapy: Secondary | ICD-10-CM | POA: Diagnosis not present

## 2020-05-18 DIAGNOSIS — G629 Polyneuropathy, unspecified: Secondary | ICD-10-CM | POA: Diagnosis not present

## 2020-05-18 DIAGNOSIS — B029 Zoster without complications: Secondary | ICD-10-CM | POA: Diagnosis not present

## 2020-05-18 DIAGNOSIS — C50912 Malignant neoplasm of unspecified site of left female breast: Secondary | ICD-10-CM | POA: Diagnosis not present

## 2020-05-22 DIAGNOSIS — Z23 Encounter for immunization: Secondary | ICD-10-CM | POA: Diagnosis not present

## 2020-05-25 DIAGNOSIS — Z0000 Encounter for general adult medical examination without abnormal findings: Secondary | ICD-10-CM | POA: Diagnosis not present

## 2020-05-25 DIAGNOSIS — Z1211 Encounter for screening for malignant neoplasm of colon: Secondary | ICD-10-CM | POA: Diagnosis not present

## 2020-05-25 DIAGNOSIS — K219 Gastro-esophageal reflux disease without esophagitis: Secondary | ICD-10-CM | POA: Diagnosis not present

## 2020-05-25 DIAGNOSIS — Z131 Encounter for screening for diabetes mellitus: Secondary | ICD-10-CM | POA: Diagnosis not present

## 2020-05-25 DIAGNOSIS — Z124 Encounter for screening for malignant neoplasm of cervix: Secondary | ICD-10-CM | POA: Diagnosis not present

## 2020-06-07 DIAGNOSIS — Z9013 Acquired absence of bilateral breasts and nipples: Secondary | ICD-10-CM | POA: Diagnosis not present

## 2020-06-07 DIAGNOSIS — C773 Secondary and unspecified malignant neoplasm of axilla and upper limb lymph nodes: Secondary | ICD-10-CM | POA: Diagnosis not present

## 2020-06-07 DIAGNOSIS — Z853 Personal history of malignant neoplasm of breast: Secondary | ICD-10-CM | POA: Diagnosis not present

## 2020-06-20 DIAGNOSIS — M25612 Stiffness of left shoulder, not elsewhere classified: Secondary | ICD-10-CM | POA: Diagnosis not present

## 2020-06-21 DIAGNOSIS — B0229 Other postherpetic nervous system involvement: Secondary | ICD-10-CM | POA: Diagnosis not present

## 2020-06-21 DIAGNOSIS — I890 Lymphedema, not elsewhere classified: Secondary | ICD-10-CM | POA: Diagnosis not present

## 2020-06-21 DIAGNOSIS — C50912 Malignant neoplasm of unspecified site of left female breast: Secondary | ICD-10-CM | POA: Diagnosis not present

## 2020-06-26 DIAGNOSIS — B0229 Other postherpetic nervous system involvement: Secondary | ICD-10-CM | POA: Diagnosis not present

## 2020-06-26 DIAGNOSIS — I890 Lymphedema, not elsewhere classified: Secondary | ICD-10-CM | POA: Diagnosis not present

## 2020-06-26 DIAGNOSIS — C50912 Malignant neoplasm of unspecified site of left female breast: Secondary | ICD-10-CM | POA: Diagnosis not present

## 2020-06-27 DIAGNOSIS — C50912 Malignant neoplasm of unspecified site of left female breast: Secondary | ICD-10-CM | POA: Diagnosis not present

## 2020-06-27 DIAGNOSIS — I10 Essential (primary) hypertension: Secondary | ICD-10-CM | POA: Diagnosis not present

## 2020-06-29 DIAGNOSIS — M25612 Stiffness of left shoulder, not elsewhere classified: Secondary | ICD-10-CM | POA: Diagnosis not present

## 2020-06-29 DIAGNOSIS — I890 Lymphedema, not elsewhere classified: Secondary | ICD-10-CM | POA: Diagnosis not present

## 2020-07-13 DIAGNOSIS — I890 Lymphedema, not elsewhere classified: Secondary | ICD-10-CM | POA: Diagnosis not present

## 2020-07-13 DIAGNOSIS — M25612 Stiffness of left shoulder, not elsewhere classified: Secondary | ICD-10-CM | POA: Diagnosis not present

## 2020-07-25 DIAGNOSIS — I890 Lymphedema, not elsewhere classified: Secondary | ICD-10-CM | POA: Diagnosis not present

## 2020-07-25 DIAGNOSIS — M25612 Stiffness of left shoulder, not elsewhere classified: Secondary | ICD-10-CM | POA: Diagnosis not present

## 2020-07-27 DIAGNOSIS — I890 Lymphedema, not elsewhere classified: Secondary | ICD-10-CM | POA: Diagnosis not present

## 2020-07-27 DIAGNOSIS — M25612 Stiffness of left shoulder, not elsewhere classified: Secondary | ICD-10-CM | POA: Diagnosis not present

## 2020-08-01 DIAGNOSIS — I890 Lymphedema, not elsewhere classified: Secondary | ICD-10-CM | POA: Diagnosis not present

## 2020-08-01 DIAGNOSIS — M25612 Stiffness of left shoulder, not elsewhere classified: Secondary | ICD-10-CM | POA: Diagnosis not present

## 2020-08-04 DIAGNOSIS — C50912 Malignant neoplasm of unspecified site of left female breast: Secondary | ICD-10-CM | POA: Diagnosis not present

## 2020-08-08 DIAGNOSIS — I890 Lymphedema, not elsewhere classified: Secondary | ICD-10-CM | POA: Diagnosis not present

## 2020-08-08 DIAGNOSIS — M25612 Stiffness of left shoulder, not elsewhere classified: Secondary | ICD-10-CM | POA: Diagnosis not present

## 2020-08-10 DIAGNOSIS — M25612 Stiffness of left shoulder, not elsewhere classified: Secondary | ICD-10-CM | POA: Diagnosis not present

## 2020-08-10 DIAGNOSIS — I890 Lymphedema, not elsewhere classified: Secondary | ICD-10-CM | POA: Diagnosis not present

## 2020-08-15 DIAGNOSIS — I890 Lymphedema, not elsewhere classified: Secondary | ICD-10-CM | POA: Diagnosis not present

## 2020-08-15 DIAGNOSIS — M25612 Stiffness of left shoulder, not elsewhere classified: Secondary | ICD-10-CM | POA: Diagnosis not present

## 2020-08-17 DIAGNOSIS — M25612 Stiffness of left shoulder, not elsewhere classified: Secondary | ICD-10-CM | POA: Diagnosis not present

## 2020-08-17 DIAGNOSIS — I890 Lymphedema, not elsewhere classified: Secondary | ICD-10-CM | POA: Diagnosis not present

## 2020-08-30 DIAGNOSIS — C7951 Secondary malignant neoplasm of bone: Secondary | ICD-10-CM

## 2020-08-30 HISTORY — DX: Secondary malignant neoplasm of bone: C79.51

## 2020-09-06 DIAGNOSIS — Z853 Personal history of malignant neoplasm of breast: Secondary | ICD-10-CM | POA: Diagnosis not present

## 2020-09-06 DIAGNOSIS — I972 Postmastectomy lymphedema syndrome: Secondary | ICD-10-CM | POA: Diagnosis not present

## 2020-09-06 DIAGNOSIS — Z9013 Acquired absence of bilateral breasts and nipples: Secondary | ICD-10-CM | POA: Diagnosis not present

## 2020-09-12 DIAGNOSIS — Z1283 Encounter for screening for malignant neoplasm of skin: Secondary | ICD-10-CM | POA: Diagnosis not present

## 2020-09-12 DIAGNOSIS — K120 Recurrent oral aphthae: Secondary | ICD-10-CM | POA: Diagnosis not present

## 2020-09-12 DIAGNOSIS — R519 Headache, unspecified: Secondary | ICD-10-CM | POA: Diagnosis not present

## 2020-09-12 DIAGNOSIS — H1013 Acute atopic conjunctivitis, bilateral: Secondary | ICD-10-CM | POA: Diagnosis not present

## 2020-09-26 DIAGNOSIS — I890 Lymphedema, not elsewhere classified: Secondary | ICD-10-CM | POA: Diagnosis not present

## 2020-09-26 DIAGNOSIS — G629 Polyneuropathy, unspecified: Secondary | ICD-10-CM | POA: Diagnosis not present

## 2020-09-26 DIAGNOSIS — Z9012 Acquired absence of left breast and nipple: Secondary | ICD-10-CM | POA: Diagnosis not present

## 2020-09-26 DIAGNOSIS — Z79899 Other long term (current) drug therapy: Secondary | ICD-10-CM | POA: Diagnosis not present

## 2020-09-26 DIAGNOSIS — C50912 Malignant neoplasm of unspecified site of left female breast: Secondary | ICD-10-CM | POA: Diagnosis not present

## 2020-09-29 DIAGNOSIS — Z23 Encounter for immunization: Secondary | ICD-10-CM | POA: Diagnosis not present

## 2020-10-31 DIAGNOSIS — Z20828 Contact with and (suspected) exposure to other viral communicable diseases: Secondary | ICD-10-CM | POA: Diagnosis not present

## 2020-11-03 DIAGNOSIS — Z7982 Long term (current) use of aspirin: Secondary | ICD-10-CM | POA: Diagnosis not present

## 2020-11-03 DIAGNOSIS — K635 Polyp of colon: Secondary | ICD-10-CM | POA: Diagnosis not present

## 2020-11-03 DIAGNOSIS — Z1211 Encounter for screening for malignant neoplasm of colon: Secondary | ICD-10-CM | POA: Diagnosis not present

## 2020-11-03 DIAGNOSIS — C50912 Malignant neoplasm of unspecified site of left female breast: Secondary | ICD-10-CM | POA: Diagnosis not present

## 2020-11-03 DIAGNOSIS — Z79899 Other long term (current) drug therapy: Secondary | ICD-10-CM | POA: Diagnosis not present

## 2020-11-03 DIAGNOSIS — D120 Benign neoplasm of cecum: Secondary | ICD-10-CM | POA: Diagnosis not present

## 2020-11-03 DIAGNOSIS — K621 Rectal polyp: Secondary | ICD-10-CM | POA: Diagnosis not present

## 2020-11-03 DIAGNOSIS — K641 Second degree hemorrhoids: Secondary | ICD-10-CM | POA: Diagnosis not present

## 2020-11-07 DIAGNOSIS — L814 Other melanin hyperpigmentation: Secondary | ICD-10-CM | POA: Diagnosis not present

## 2020-11-07 DIAGNOSIS — D2261 Melanocytic nevi of right upper limb, including shoulder: Secondary | ICD-10-CM | POA: Diagnosis not present

## 2020-11-07 DIAGNOSIS — D2262 Melanocytic nevi of left upper limb, including shoulder: Secondary | ICD-10-CM | POA: Diagnosis not present

## 2020-11-07 DIAGNOSIS — L853 Xerosis cutis: Secondary | ICD-10-CM | POA: Diagnosis not present

## 2020-11-07 DIAGNOSIS — Z7189 Other specified counseling: Secondary | ICD-10-CM | POA: Diagnosis not present

## 2020-11-07 DIAGNOSIS — D2272 Melanocytic nevi of left lower limb, including hip: Secondary | ICD-10-CM | POA: Diagnosis not present

## 2020-11-07 DIAGNOSIS — D225 Melanocytic nevi of trunk: Secondary | ICD-10-CM | POA: Diagnosis not present

## 2020-11-07 DIAGNOSIS — L821 Other seborrheic keratosis: Secondary | ICD-10-CM | POA: Diagnosis not present

## 2020-11-07 DIAGNOSIS — D2271 Melanocytic nevi of right lower limb, including hip: Secondary | ICD-10-CM | POA: Diagnosis not present

## 2020-11-08 DIAGNOSIS — C50912 Malignant neoplasm of unspecified site of left female breast: Secondary | ICD-10-CM | POA: Diagnosis not present

## 2020-11-08 DIAGNOSIS — R222 Localized swelling, mass and lump, trunk: Secondary | ICD-10-CM | POA: Diagnosis not present

## 2020-11-08 DIAGNOSIS — R079 Chest pain, unspecified: Secondary | ICD-10-CM | POA: Diagnosis not present

## 2020-11-10 DIAGNOSIS — C7951 Secondary malignant neoplasm of bone: Secondary | ICD-10-CM | POA: Diagnosis not present

## 2020-11-10 DIAGNOSIS — C50912 Malignant neoplasm of unspecified site of left female breast: Secondary | ICD-10-CM | POA: Diagnosis not present

## 2020-11-13 DIAGNOSIS — B029 Zoster without complications: Secondary | ICD-10-CM | POA: Diagnosis not present

## 2020-11-13 DIAGNOSIS — I890 Lymphedema, not elsewhere classified: Secondary | ICD-10-CM | POA: Diagnosis not present

## 2020-11-13 DIAGNOSIS — B0229 Other postherpetic nervous system involvement: Secondary | ICD-10-CM | POA: Diagnosis not present

## 2020-11-13 DIAGNOSIS — C50912 Malignant neoplasm of unspecified site of left female breast: Secondary | ICD-10-CM | POA: Diagnosis not present

## 2020-11-13 DIAGNOSIS — C7951 Secondary malignant neoplasm of bone: Secondary | ICD-10-CM | POA: Diagnosis not present

## 2020-11-24 DIAGNOSIS — C50912 Malignant neoplasm of unspecified site of left female breast: Secondary | ICD-10-CM | POA: Diagnosis not present

## 2020-11-24 DIAGNOSIS — C801 Malignant (primary) neoplasm, unspecified: Secondary | ICD-10-CM | POA: Diagnosis not present

## 2020-11-24 DIAGNOSIS — C7951 Secondary malignant neoplasm of bone: Secondary | ICD-10-CM | POA: Diagnosis not present

## 2020-11-27 DIAGNOSIS — C50912 Malignant neoplasm of unspecified site of left female breast: Secondary | ICD-10-CM | POA: Diagnosis not present

## 2020-11-27 DIAGNOSIS — C7951 Secondary malignant neoplasm of bone: Secondary | ICD-10-CM | POA: Diagnosis not present

## 2020-12-04 DIAGNOSIS — I972 Postmastectomy lymphedema syndrome: Secondary | ICD-10-CM | POA: Diagnosis not present

## 2020-12-05 DIAGNOSIS — Z5111 Encounter for antineoplastic chemotherapy: Secondary | ICD-10-CM | POA: Diagnosis not present

## 2020-12-05 DIAGNOSIS — C50912 Malignant neoplasm of unspecified site of left female breast: Secondary | ICD-10-CM | POA: Diagnosis not present

## 2020-12-06 DIAGNOSIS — Z853 Personal history of malignant neoplasm of breast: Secondary | ICD-10-CM | POA: Diagnosis not present

## 2020-12-06 DIAGNOSIS — Z9013 Acquired absence of bilateral breasts and nipples: Secondary | ICD-10-CM | POA: Diagnosis not present

## 2020-12-06 DIAGNOSIS — I972 Postmastectomy lymphedema syndrome: Secondary | ICD-10-CM | POA: Diagnosis not present

## 2020-12-19 DIAGNOSIS — Z5111 Encounter for antineoplastic chemotherapy: Secondary | ICD-10-CM | POA: Diagnosis not present

## 2020-12-19 DIAGNOSIS — C50912 Malignant neoplasm of unspecified site of left female breast: Secondary | ICD-10-CM | POA: Diagnosis not present

## 2021-01-02 DIAGNOSIS — Z7982 Long term (current) use of aspirin: Secondary | ICD-10-CM | POA: Diagnosis not present

## 2021-01-02 DIAGNOSIS — R5383 Other fatigue: Secondary | ICD-10-CM | POA: Diagnosis not present

## 2021-01-02 DIAGNOSIS — C50912 Malignant neoplasm of unspecified site of left female breast: Secondary | ICD-10-CM | POA: Diagnosis not present

## 2021-01-02 DIAGNOSIS — Z79891 Long term (current) use of opiate analgesic: Secondary | ICD-10-CM | POA: Diagnosis not present

## 2021-01-02 DIAGNOSIS — Z79899 Other long term (current) drug therapy: Secondary | ICD-10-CM | POA: Diagnosis not present

## 2021-01-02 DIAGNOSIS — C7951 Secondary malignant neoplasm of bone: Secondary | ICD-10-CM | POA: Diagnosis not present

## 2021-01-08 DIAGNOSIS — C50912 Malignant neoplasm of unspecified site of left female breast: Secondary | ICD-10-CM | POA: Diagnosis not present

## 2021-01-08 DIAGNOSIS — C7951 Secondary malignant neoplasm of bone: Secondary | ICD-10-CM | POA: Diagnosis not present

## 2021-01-08 DIAGNOSIS — Z79891 Long term (current) use of opiate analgesic: Secondary | ICD-10-CM | POA: Diagnosis not present

## 2021-01-08 DIAGNOSIS — Z79899 Other long term (current) drug therapy: Secondary | ICD-10-CM | POA: Diagnosis not present

## 2021-01-08 DIAGNOSIS — Z7982 Long term (current) use of aspirin: Secondary | ICD-10-CM | POA: Diagnosis not present

## 2021-01-08 DIAGNOSIS — R5383 Other fatigue: Secondary | ICD-10-CM | POA: Diagnosis not present

## 2021-01-30 DIAGNOSIS — G629 Polyneuropathy, unspecified: Secondary | ICD-10-CM | POA: Diagnosis not present

## 2021-01-30 DIAGNOSIS — I890 Lymphedema, not elsewhere classified: Secondary | ICD-10-CM | POA: Diagnosis not present

## 2021-01-30 DIAGNOSIS — B0229 Other postherpetic nervous system involvement: Secondary | ICD-10-CM | POA: Diagnosis not present

## 2021-01-30 DIAGNOSIS — C50912 Malignant neoplasm of unspecified site of left female breast: Secondary | ICD-10-CM | POA: Diagnosis not present

## 2021-01-30 DIAGNOSIS — C7951 Secondary malignant neoplasm of bone: Secondary | ICD-10-CM | POA: Diagnosis not present

## 2021-02-15 DIAGNOSIS — C50912 Malignant neoplasm of unspecified site of left female breast: Secondary | ICD-10-CM | POA: Diagnosis not present

## 2021-02-15 DIAGNOSIS — C50011 Malignant neoplasm of nipple and areola, right female breast: Secondary | ICD-10-CM | POA: Diagnosis not present

## 2021-02-15 DIAGNOSIS — C50012 Malignant neoplasm of nipple and areola, left female breast: Secondary | ICD-10-CM | POA: Diagnosis not present

## 2021-02-15 DIAGNOSIS — C50911 Malignant neoplasm of unspecified site of right female breast: Secondary | ICD-10-CM | POA: Diagnosis not present

## 2021-02-15 DIAGNOSIS — I972 Postmastectomy lymphedema syndrome: Secondary | ICD-10-CM | POA: Diagnosis not present

## 2021-02-27 DIAGNOSIS — C50912 Malignant neoplasm of unspecified site of left female breast: Secondary | ICD-10-CM | POA: Diagnosis not present

## 2021-02-27 DIAGNOSIS — R1319 Other dysphagia: Secondary | ICD-10-CM | POA: Diagnosis not present

## 2021-02-27 DIAGNOSIS — R0602 Shortness of breath: Secondary | ICD-10-CM | POA: Diagnosis not present

## 2021-02-27 DIAGNOSIS — C7951 Secondary malignant neoplasm of bone: Secondary | ICD-10-CM | POA: Diagnosis not present

## 2021-03-07 DIAGNOSIS — I972 Postmastectomy lymphedema syndrome: Secondary | ICD-10-CM | POA: Diagnosis not present

## 2021-03-07 DIAGNOSIS — Z853 Personal history of malignant neoplasm of breast: Secondary | ICD-10-CM | POA: Diagnosis not present

## 2021-03-07 DIAGNOSIS — Z9013 Acquired absence of bilateral breasts and nipples: Secondary | ICD-10-CM | POA: Diagnosis not present

## 2021-03-13 DIAGNOSIS — K219 Gastro-esophageal reflux disease without esophagitis: Secondary | ICD-10-CM | POA: Diagnosis not present

## 2021-03-13 DIAGNOSIS — K224 Dyskinesia of esophagus: Secondary | ICD-10-CM | POA: Diagnosis not present

## 2021-03-13 DIAGNOSIS — R1319 Other dysphagia: Secondary | ICD-10-CM | POA: Diagnosis not present

## 2021-03-23 DIAGNOSIS — C50912 Malignant neoplasm of unspecified site of left female breast: Secondary | ICD-10-CM | POA: Diagnosis not present

## 2021-03-23 DIAGNOSIS — C7951 Secondary malignant neoplasm of bone: Secondary | ICD-10-CM | POA: Diagnosis not present

## 2021-03-27 DIAGNOSIS — C7951 Secondary malignant neoplasm of bone: Secondary | ICD-10-CM | POA: Diagnosis not present

## 2021-03-27 DIAGNOSIS — C50912 Malignant neoplasm of unspecified site of left female breast: Secondary | ICD-10-CM | POA: Diagnosis not present

## 2021-04-13 DIAGNOSIS — C50912 Malignant neoplasm of unspecified site of left female breast: Secondary | ICD-10-CM | POA: Diagnosis not present

## 2021-04-13 DIAGNOSIS — C7951 Secondary malignant neoplasm of bone: Secondary | ICD-10-CM | POA: Diagnosis not present

## 2021-04-16 DIAGNOSIS — C50919 Malignant neoplasm of unspecified site of unspecified female breast: Secondary | ICD-10-CM | POA: Diagnosis not present

## 2021-04-16 DIAGNOSIS — C7951 Secondary malignant neoplasm of bone: Secondary | ICD-10-CM | POA: Diagnosis not present

## 2021-04-24 DIAGNOSIS — Z79899 Other long term (current) drug therapy: Secondary | ICD-10-CM | POA: Diagnosis not present

## 2021-04-24 DIAGNOSIS — C7951 Secondary malignant neoplasm of bone: Secondary | ICD-10-CM | POA: Diagnosis not present

## 2021-04-24 DIAGNOSIS — Z7982 Long term (current) use of aspirin: Secondary | ICD-10-CM | POA: Diagnosis not present

## 2021-04-24 DIAGNOSIS — C50912 Malignant neoplasm of unspecified site of left female breast: Secondary | ICD-10-CM | POA: Diagnosis not present

## 2021-05-21 DIAGNOSIS — Z20822 Contact with and (suspected) exposure to covid-19: Secondary | ICD-10-CM | POA: Diagnosis not present

## 2021-05-21 DIAGNOSIS — J019 Acute sinusitis, unspecified: Secondary | ICD-10-CM | POA: Diagnosis not present

## 2021-05-21 DIAGNOSIS — R062 Wheezing: Secondary | ICD-10-CM | POA: Diagnosis not present

## 2021-05-24 DIAGNOSIS — C50912 Malignant neoplasm of unspecified site of left female breast: Secondary | ICD-10-CM | POA: Diagnosis not present

## 2021-05-24 DIAGNOSIS — C7951 Secondary malignant neoplasm of bone: Secondary | ICD-10-CM | POA: Diagnosis not present

## 2021-05-24 DIAGNOSIS — Z7982 Long term (current) use of aspirin: Secondary | ICD-10-CM | POA: Diagnosis not present

## 2021-06-06 DIAGNOSIS — Z853 Personal history of malignant neoplasm of breast: Secondary | ICD-10-CM | POA: Diagnosis not present

## 2021-06-06 DIAGNOSIS — Z9013 Acquired absence of bilateral breasts and nipples: Secondary | ICD-10-CM | POA: Diagnosis not present

## 2021-06-13 DIAGNOSIS — W57XXXA Bitten or stung by nonvenomous insect and other nonvenomous arthropods, initial encounter: Secondary | ICD-10-CM | POA: Diagnosis not present

## 2021-06-13 DIAGNOSIS — S50861A Insect bite (nonvenomous) of right forearm, initial encounter: Secondary | ICD-10-CM | POA: Diagnosis not present

## 2021-06-19 DIAGNOSIS — C50912 Malignant neoplasm of unspecified site of left female breast: Secondary | ICD-10-CM | POA: Diagnosis not present

## 2021-06-19 DIAGNOSIS — Z7982 Long term (current) use of aspirin: Secondary | ICD-10-CM | POA: Diagnosis not present

## 2021-06-19 DIAGNOSIS — C7951 Secondary malignant neoplasm of bone: Secondary | ICD-10-CM | POA: Diagnosis not present

## 2021-07-03 ENCOUNTER — Encounter (HOSPITAL_COMMUNITY): Payer: Self-pay | Admitting: Surgery

## 2021-07-03 ENCOUNTER — Other Ambulatory Visit: Payer: Self-pay

## 2021-07-06 ENCOUNTER — Encounter (HOSPITAL_COMMUNITY): Payer: Self-pay | Admitting: Hematology and Oncology

## 2021-07-06 ENCOUNTER — Other Ambulatory Visit: Payer: Self-pay

## 2021-07-06 ENCOUNTER — Inpatient Hospital Stay (HOSPITAL_COMMUNITY): Payer: Medicaid Other

## 2021-07-06 ENCOUNTER — Inpatient Hospital Stay (HOSPITAL_COMMUNITY): Payer: Medicaid Other | Attending: Hematology | Admitting: Hematology and Oncology

## 2021-07-06 VITALS — BP 134/69 | HR 74 | Temp 98.9°F | Resp 16 | Wt 175.8 lb

## 2021-07-06 DIAGNOSIS — Z170 Estrogen receptor positive status [ER+]: Secondary | ICD-10-CM | POA: Diagnosis not present

## 2021-07-06 DIAGNOSIS — I10 Essential (primary) hypertension: Secondary | ICD-10-CM | POA: Diagnosis not present

## 2021-07-06 DIAGNOSIS — C50919 Malignant neoplasm of unspecified site of unspecified female breast: Secondary | ICD-10-CM | POA: Diagnosis not present

## 2021-07-06 DIAGNOSIS — Z7982 Long term (current) use of aspirin: Secondary | ICD-10-CM | POA: Insufficient documentation

## 2021-07-06 DIAGNOSIS — M8580 Other specified disorders of bone density and structure, unspecified site: Secondary | ICD-10-CM | POA: Diagnosis not present

## 2021-07-06 DIAGNOSIS — C7951 Secondary malignant neoplasm of bone: Secondary | ICD-10-CM | POA: Diagnosis not present

## 2021-07-06 DIAGNOSIS — Z79899 Other long term (current) drug therapy: Secondary | ICD-10-CM | POA: Insufficient documentation

## 2021-07-06 DIAGNOSIS — N951 Menopausal and female climacteric states: Secondary | ICD-10-CM | POA: Diagnosis not present

## 2021-07-06 DIAGNOSIS — Z5111 Encounter for antineoplastic chemotherapy: Secondary | ICD-10-CM | POA: Diagnosis not present

## 2021-07-06 DIAGNOSIS — D709 Neutropenia, unspecified: Secondary | ICD-10-CM | POA: Diagnosis not present

## 2021-07-06 DIAGNOSIS — F17210 Nicotine dependence, cigarettes, uncomplicated: Secondary | ICD-10-CM | POA: Diagnosis not present

## 2021-07-06 LAB — CBC WITH DIFFERENTIAL/PLATELET
Abs Immature Granulocytes: 0.01 10*3/uL (ref 0.00–0.07)
Basophils Absolute: 0.1 10*3/uL (ref 0.0–0.1)
Basophils Relative: 2 %
Eosinophils Absolute: 0 10*3/uL (ref 0.0–0.5)
Eosinophils Relative: 2 %
HCT: 38.5 % (ref 36.0–46.0)
Hemoglobin: 13 g/dL (ref 12.0–15.0)
Immature Granulocytes: 0 %
Lymphocytes Relative: 40 %
Lymphs Abs: 0.9 10*3/uL (ref 0.7–4.0)
MCH: 37.1 pg — ABNORMAL HIGH (ref 26.0–34.0)
MCHC: 33.8 g/dL (ref 30.0–36.0)
MCV: 110 fL — ABNORMAL HIGH (ref 80.0–100.0)
Monocytes Absolute: 0.1 10*3/uL (ref 0.1–1.0)
Monocytes Relative: 5 %
Neutro Abs: 1.1 10*3/uL — ABNORMAL LOW (ref 1.7–7.7)
Neutrophils Relative %: 51 %
Platelets: 235 10*3/uL (ref 150–400)
RBC: 3.5 MIL/uL — ABNORMAL LOW (ref 3.87–5.11)
RDW: 14.3 % (ref 11.5–15.5)
WBC: 2.2 10*3/uL — ABNORMAL LOW (ref 4.0–10.5)
nRBC: 0 % (ref 0.0–0.2)

## 2021-07-06 LAB — COMPREHENSIVE METABOLIC PANEL
ALT: 16 U/L (ref 0–44)
AST: 16 U/L (ref 15–41)
Albumin: 4.2 g/dL (ref 3.5–5.0)
Alkaline Phosphatase: 56 U/L (ref 38–126)
Anion gap: 8 (ref 5–15)
BUN: 14 mg/dL (ref 6–20)
CO2: 24 mmol/L (ref 22–32)
Calcium: 9.4 mg/dL (ref 8.9–10.3)
Chloride: 107 mmol/L (ref 98–111)
Creatinine, Ser: 0.97 mg/dL (ref 0.44–1.00)
GFR, Estimated: >60 mL/min (ref >60)
Glucose, Bld: 86 mg/dL (ref 70–99)
Potassium: 3.8 mmol/L (ref 3.5–5.1)
Sodium: 139 mmol/L (ref 135–145)
Total Bilirubin: 0.7 mg/dL (ref 0.3–1.2)
Total Protein: 7.3 g/dL (ref 6.5–8.1)

## 2021-07-06 MED ORDER — VENLAFAXINE HCL ER 37.5 MG PO CP24: 37.5000 mg | ORAL_CAPSULE | Freq: Every day | ORAL | 0 refills | Status: DC

## 2021-07-06 MED ORDER — VENLAFAXINE HCL 37.5 MG PO TABS: 37.5000 mg | ORAL_TABLET | Freq: Two times a day (BID) | ORAL | 2 refills | Status: DC

## 2021-07-06 NOTE — Progress Notes (Addendum)
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  She then had infection and necrosis and hence underwent removal of implants and was also taking adjuvant anastrozole at that time.  She was found to have metastatic disease in November 2021 while she was on anastrozole hence her treatment was changed to Ibrance, fulvestrant and denosumab.  She is tolerating this regimen very well, currently continues on full dose Ibrance. She does have very tolerable side effects from Ibrance, white blood cell count today is 2200 with ANC of 1100. We have discussed about continuing current regimen until progression, her treatment will be scheduled for July 19 for Xgeva and Faslodex.  Treatment plan placed. NO new dental issues can continue xgeva.  2. Bone pain, currently managed without medication.  3. Neutropenia from Ibrance, no intervention needed, ok to proceed with ibrance as scheduled.  4. Hot flashes, will try effexor XR 37.5 mg daily for better control of hot flashes. Patient informed not to pick up the twice a day prescription.  Anticipate monitoring of labs and tumor markers every month, consider images periodically. Last imaging in April 2022.  HISTORY OF PRESENTING ILLNESS:   Wendy Zamora 60 y.o. female is here because of breast cancer  This is a pleasant 60 year old female patient who moved from [place] to [place]  establishing with medical oncology locally.  We have received over 130 pages of records from her previous oncologist.  Oncological history as mentioned below. She was initially diagnosed on April 14, 2019 with inflammatory carcinoma of the left breast.  She was staged as T4d N1 M0 or stage IIIb grade 2 ER/PR positive HER2 negative and was treated with neoadjuvant chemotherapy epirubicin and cyclophosphamide followed by weekly paclitaxel which she completed on September 16, 2019 She then underwent mastectomy with axillary dissection, final pathologic staging PT3PN2A, 3 nodes with extranodal extension, ER/PR positive HER2 negative R0.  Aggregate tumor was more than 8 cm residual, close deep margins, immediate implants. She underwent removal of implants on December 10, 2019 From 02/29/2020 to 04/12/2020 she underwent postmastectomy RT to chest wall and regional nodes 6000 cGy to the chest wall and 5000 cGy to the nodes.  She was started on adjuvant anastrozole on 12/09/2019 11/10/2020 she was found to have osseous disease in the thoracic spine, PET scan obtained showed number of bony metastatic lesions most intense area of activity was lumbar spine L4 with SUV of 10.4.  She also had lesions in T7, T8, T9, T12, L2, L3, L5, mid sacrum, right acetabulum, right femoral neck, right proximal femur, left acetabulum and right anterior fifth rib She was then staged as stage IV T4DN2A clinical M1 grade 2 ER/PR positive she was started on fulvestrant plus palbociclib for osseous recurrence.  She is on monthly denosumab for metastatic disease.  She is establishing care with [place] today here.  She arrived to the appointment by herself.  She has no pain issues.  She does complain of some hot flashes which has been bothersome lately especially given the  hot weather.  She has been very compliant with Wendy Zamora, she is due to restart her cycle after her week off on July 20.  She is due to receive her Faslodex and Xgeva on July 17, 2021.  Her last imaging was in April 2022 which showed response to treatment.  However there is some comment made in the note that her tumor marker is slowly increasing.She has has some tightness in left chest since surgery and radiation.  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  Headaches are mild and not very prominent at all.  Rest of the pertinent 10 point ROS reviewed and negative.  MEDICAL HISTORY:  Past Medical History:  Diagnosis Date   Allergy    Bone metastasis (HCC)    Breast cancer (HCC)    GERD (gastroesophageal reflux disease)    Hypertension    Lymphedema of arm    left arm   Osteopenia     SURGICAL HISTORY: Past Surgical History:  Procedure Laterality Date   CARPAL TUNNEL RELEASE Bilateral 2006   hysteroscopy     mastectomy Bilateral 10/22/2019   pt had necrosis after 1st surgery, had another surgery to remove implants   TUBAL LIGATION Bilateral     SOCIAL HISTORY: Social History   Socioeconomic History   Marital status: Married    Spouse name: Not on file   Number of children: 2   Years of education: Not on file   Highest education level: Not on file  Occupational History   Occupation: Disability  Tobacco Use   Smoking status: Every Day    Packs/day: 0.50    Years: 40.00    Pack years: 20.00    Types: Cigarettes   Smokeless tobacco: Never  Vaping Use   Vaping Use: Never used  Substance and Sexual Activity   Alcohol use: Never   Drug use: Never   Sexual activity: Not on file  Other Topics Concern   Not on file  Social History Narrative   Not on file   Social Determinants of Health   Financial Resource Strain: Low Risk    Difficulty of Paying Living Expenses: Not hard at all  Food Insecurity: No Food Insecurity   Worried About Charity fundraiser in the Last Year: Never true   Ran Out of Food in the Last Year: Never true  Transportation Needs: No Transportation Needs   Lack of Transportation (Medical): No   Lack of  Transportation (Non-Medical): No  Physical Activity: Insufficiently Active   Days of Exercise per Week: 5 days   Minutes of Exercise per Session: 10 min  Stress: No Stress Concern Present   Feeling of Stress : Only a little  Social Connections: Moderately Isolated   Frequency of Communication with Friends and Family: Three times a week   Frequency of Social Gatherings with Friends and Family: Once a week   Attends Religious Services: Never   Marine scientist or Organizations: No   Attends Music therapist: Never   Marital Status: Married  Human resources officer Violence: Not At Risk   Fear of Current or Ex-Partner: No   Emotionally Abused: No   Physically Abused: No   Sexually Abused: No    FAMILY HISTORY: No family history on file.  ALLERGIES:  is allergic to augmentin [amoxicillin-pot clavulanate], ciprofloxacin, claritin [loratadine], gabapentin, keflex [cephalexin], levofloxacin, lisinopril, metoprolol, nexium [esomeprazole], tetracyclines & related, chromium, oxycodone, latex, and nickel.  MEDICATIONS:  Current Outpatient Medications  Medication Sig Dispense  Refill   AMLODIPINE BENZOATE PO Take 5 mg by mouth daily.     Ascorbic Acid (VITAMIN C) 1000 MG tablet Take 1,000 mg by mouth daily.     aspirin EC 81 MG tablet Take 81 mg by mouth daily. Swallow whole.     butalbital-acetaminophen-caffeine (FIORICET) 50-325-40 MG tablet Take 1 tablet by mouth as needed for headache.     Calcium-Magnesium-Vitamin D (CALCIUM 1200+D3 PO) Take 1 tablet by mouth daily.     cetirizine (ZYRTEC) 10 MG tablet Take 10 mg by mouth daily.     Cinnamon 500 MG TABS Take 2 tablets by mouth daily.     cyanocobalamin 1000 MCG tablet Take 1,000 mcg by mouth daily.     cyclobenzaprine (FLEXERIL) 10 MG tablet Take 10 mg by mouth as needed for muscle spasms.     denosumab (XGEVA) 120 MG/1.7ML SOLN injection Inject 120 mg into the skin every 30 (thirty) days.     fulvestrant (FASLODEX) 250  MG/5ML injection Inject 500 mg into the muscle every 30 (thirty) days. One injection each buttock over 1-2 minutes. Warm prior to use.     furosemide (LASIX) 20 MG tablet Take 20 mg by mouth as needed.     HYDROcodone-acetaminophen (NORCO/VICODIN) 5-325 MG tablet Take 1-2 tablets by mouth every 6 (six) hours as needed for moderate pain.     lansoprazole (PREVACID) 15 MG capsule Take 15 mg by mouth daily at 12 noon. Pt takes 1-2 tablets daily     palbociclib (IBRANCE) 125 MG tablet Take 125 mg by mouth daily. Take for 21 days on, 7 days off, repeat every 28 days.     No current facility-administered medications for this visit.   PHYSICAL EXAMINATION: ECOG PERFORMANCE STATUS: 0 - Asymptomatic  Vitals:   07/06/21 1043  BP: 134/69  Pulse: 74  Resp: 16  Temp: 98.9 F (37.2 C)  SpO2: 99%   Filed Weights   07/06/21 1043  Weight: 175 lb 12.8 oz (79.7 kg)    GENERAL:alert, no distress and comfortable SKIN: skin color, texture, turgor are normal, no rashes or significant lesions EYES: normal, conjunctiva are pink and non-injected, sclera clear OROPHARYNX:no exudate, no erythema and lips, buccal mucosa, and tongue normal  NECK: supple, thyroid normal size, non-tender, without nodularity LYMPH:  no palpable lymphadenopathy in the cervical, axillary or inguinal LUNGS: clear to auscultation and percussion with normal breathing effort HEART: regular rate & rhythm and no murmurs and no lower extremity edema ABDOMEN:abdomen soft, non-tender and normal bowel sounds Musculoskeletal:no cyanosis of digits and no clubbing  PSYCH: alert & oriented x 3 with fluent speech NEURO: no focal motor/sensory deficits Breasts: Bilateral breasts inspected.  Postsurgical and postradiation changes noted in the left breast.  No palpable masses or regional lymphadenopathy.  LABORATORY DATA:  I have reviewed the data as listed No results found for: WBC, HGB, HCT, MCV, PLT   Chemistry   No results found for: NA,  K, CL, CO2, BUN, CREATININE, GLU No results found for: CALCIUM, ALKPHOS, AST, ALT, BILITOT     RADIOGRAPHIC STUDIES: I have personally reviewed the radiological images as listed and agreed with the findings in the report. No results found.  All questions were answered. The patient knows to call the clinic with any problems, questions or concerns. I spent 90 minutes in the care of this patient including H and P, review of records, counseling and coordination of care. About 30 minutes were spent reviewing records. We have received 130 pages of  records, reviewed most of the pertinent outside medical records, history and physical, discussed about treatment recommendations, hot flashes management, role of imaging and tumor markers and monitoring metastatic breast cancer and follow-up recommendations    Benay Pike, MD 07/06/2021 10:50 AM

## 2021-07-06 NOTE — Progress Notes (Addendum)
I met with the patient today following her appointment with Dr. Chryl Heck. Patient tells me that she has returned to the area to be with family after a decade away. Patient inquires about primary care providers, eye doctors, dentists, and prosthesis availability in the area. Resources given. Patient reports no need for prostheses at this this but will call the clinic if that changes. I provided my contact information and encouraged the patient to call with questions or concerns.

## 2021-07-06 NOTE — Progress Notes (Signed)
START ON PATHWAY REGIMEN - Breast     Cycle 1: A cycle is 28 days:     Palbociclib      Fulvestrant    Cycles 2 and beyond: A cycle is every 28 days:     Palbociclib      Fulvestrant   **Always confirm dose/schedule in your pharmacy ordering system**  Patient Characteristics: Distant Metastases or Locoregional Recurrent Disease - Unresected or Locally Advanced Unresectable Disease Progressing after Neoadjuvant and Local Therapies, HER2 Negative/Unknown/Equivocal, ER Positive, Endocrine Therapy, Postmenopausal, First Line,  Progression on Current Adjuvant  Endocrine Therapy Therapeutic Status: Distant Metastases ER Status: Positive (+) HER2 Status: Negative (-) PR Status: Positive (+) Therapy Approach Indicated: Standard Chemotherapy/Endocrine Therapy Menopausal Status: Postmenopausal Line of Therapy: First Line Previous Therapy: Progression on Current Adjuvant Endocrine Therapy Intent of Therapy: Non-Curative / Palliative Intent, Discussed with Patient

## 2021-07-07 LAB — CANCER ANTIGEN 15-3: CA 15-3: 34 U/mL — ABNORMAL HIGH (ref 0.0–25.0)

## 2021-07-07 LAB — CANCER ANTIGEN 27.29: CA 27.29: 48.7 U/mL — ABNORMAL HIGH (ref 0.0–38.6)

## 2021-07-09 ENCOUNTER — Ambulatory Visit (HOSPITAL_COMMUNITY): Payer: Medicaid Other | Admitting: Hematology

## 2021-07-10 ENCOUNTER — Ambulatory Visit
Admission: RE | Admit: 2021-07-10 | Discharge: 2021-07-10 | Disposition: A | Discharge status: Home / Self Care | Payer: Self-pay | Source: Ambulatory Visit | Attending: Hematology and Oncology | Admitting: Hematology and Oncology

## 2021-07-10 ENCOUNTER — Other Ambulatory Visit (HOSPITAL_COMMUNITY): Payer: Self-pay | Admitting: Hematology and Oncology

## 2021-07-10 ENCOUNTER — Inpatient Hospital Stay
Admission: RE | Admit: 2021-07-10 | Discharge: 2021-07-10 | Disposition: A | Discharge status: Home / Self Care | Payer: Self-pay | Source: Ambulatory Visit | Attending: Hematology and Oncology | Admitting: Hematology and Oncology

## 2021-07-10 DIAGNOSIS — C50919 Malignant neoplasm of unspecified site of unspecified female breast: Secondary | ICD-10-CM

## 2021-07-11 ENCOUNTER — Ambulatory Visit
Admission: RE | Admit: 2021-07-11 | Discharge: 2021-07-11 | Disposition: A | Discharge status: Home / Self Care | Payer: Self-pay | Source: Ambulatory Visit | Attending: Hematology and Oncology | Admitting: Hematology and Oncology

## 2021-07-11 ENCOUNTER — Inpatient Hospital Stay
Admission: RE | Admit: 2021-07-11 | Discharge: 2021-07-11 | Disposition: A | Discharge status: Home / Self Care | Payer: Self-pay | Source: Ambulatory Visit | Attending: Hematology and Oncology | Admitting: Hematology and Oncology

## 2021-07-11 ENCOUNTER — Other Ambulatory Visit (HOSPITAL_COMMUNITY): Payer: Self-pay | Admitting: Hematology and Oncology

## 2021-07-11 DIAGNOSIS — C50919 Malignant neoplasm of unspecified site of unspecified female breast: Secondary | ICD-10-CM

## 2021-07-17 ENCOUNTER — Other Ambulatory Visit (HOSPITAL_COMMUNITY): Payer: Medicaid Other

## 2021-07-17 ENCOUNTER — Ambulatory Visit (HOSPITAL_COMMUNITY): Payer: Medicaid Other

## 2021-07-18 ENCOUNTER — Inpatient Hospital Stay (HOSPITAL_COMMUNITY): Payer: Medicaid Other

## 2021-07-18 ENCOUNTER — Other Ambulatory Visit: Payer: Self-pay

## 2021-07-18 VITALS — BP 144/78 | HR 92 | Temp 97.0°F | Resp 18

## 2021-07-18 DIAGNOSIS — C50919 Malignant neoplasm of unspecified site of unspecified female breast: Secondary | ICD-10-CM

## 2021-07-18 DIAGNOSIS — Z5111 Encounter for antineoplastic chemotherapy: Secondary | ICD-10-CM | POA: Diagnosis not present

## 2021-07-18 LAB — CBC WITH DIFFERENTIAL/PLATELET
Abs Immature Granulocytes: 0.01 10*3/uL (ref 0.00–0.07)
Basophils Absolute: 0.1 10*3/uL (ref 0.0–0.1)
Basophils Relative: 2 %
Eosinophils Absolute: 0.1 10*3/uL (ref 0.0–0.5)
Eosinophils Relative: 2 %
HCT: 38.5 % (ref 36.0–46.0)
Hemoglobin: 13.1 g/dL (ref 12.0–15.0)
Immature Granulocytes: 0 %
Lymphocytes Relative: 38 %
Lymphs Abs: 1.2 10*3/uL (ref 0.7–4.0)
MCH: 36.5 pg — ABNORMAL HIGH (ref 26.0–34.0)
MCHC: 34 g/dL (ref 30.0–36.0)
MCV: 107.2 fL — ABNORMAL HIGH (ref 80.0–100.0)
Monocytes Absolute: 0.5 10*3/uL (ref 0.1–1.0)
Monocytes Relative: 17 %
Neutro Abs: 1.2 10*3/uL — ABNORMAL LOW (ref 1.7–7.7)
Neutrophils Relative %: 41 %
Platelets: 223 10*3/uL (ref 150–400)
RBC: 3.59 MIL/uL — ABNORMAL LOW (ref 3.87–5.11)
RDW: 14.2 % (ref 11.5–15.5)
WBC: 3.2 10*3/uL — ABNORMAL LOW (ref 4.0–10.5)
nRBC: 0 % (ref 0.0–0.2)

## 2021-07-18 LAB — COMPREHENSIVE METABOLIC PANEL
ALT: 15 U/L (ref 0–44)
AST: 18 U/L (ref 15–41)
Albumin: 4.1 g/dL (ref 3.5–5.0)
Alkaline Phosphatase: 57 U/L (ref 38–126)
Anion gap: 8 (ref 5–15)
BUN: 10 mg/dL (ref 6–20)
CO2: 23 mmol/L (ref 22–32)
Calcium: 8.9 mg/dL (ref 8.9–10.3)
Chloride: 107 mmol/L (ref 98–111)
Creatinine, Ser: 0.74 mg/dL (ref 0.44–1.00)
GFR, Estimated: >60 mL/min (ref >60)
Glucose, Bld: 102 mg/dL — ABNORMAL HIGH (ref 70–99)
Potassium: 3.9 mmol/L (ref 3.5–5.1)
Sodium: 138 mmol/L (ref 135–145)
Total Bilirubin: 0.4 mg/dL (ref 0.3–1.2)
Total Protein: 7.1 g/dL (ref 6.5–8.1)

## 2021-07-18 MED ORDER — DENOSUMAB 120 MG/1.7ML ~~LOC~~ SOLN
120.0000 mg | Freq: Once | SUBCUTANEOUS | Status: AC
  Administered 2021-07-18: 120 mg via SUBCUTANEOUS

## 2021-07-18 MED ORDER — FULVESTRANT 250 MG/5ML IM SOLN
500.0000 mg | Freq: Once | INTRAMUSCULAR | Status: AC
  Administered 2021-07-18: 500 mg via INTRAMUSCULAR

## 2021-07-18 MED ORDER — FULVESTRANT 250 MG/5ML IM SOLN
INTRAMUSCULAR | Status: AC
  Filled 2021-07-18: qty 10

## 2021-07-18 NOTE — Progress Notes (Signed)
Wendy Zamora presents today for Xgeva and Faslodex injections per the provider's orders. Calcium noted to be 8.9, no recent dental work, no jaw pain, and patient has been taking Calcium and Vitamin D supplements. Stable during administration without incident; injection site WNL; see MAR for injection details.  Patient tolerated procedure well and without incident.  No questions or complaints noted at this time. Discharge from clinic ambulatory in stable condition.  Alert and oriented X 3.  Follow up with [facility] as scheduled.

## 2021-07-18 NOTE — Patient Instructions (Signed)
[facility]  Discharge Instructions: Thank you for choosing [facility] to provide your oncology and hematology care.  If you have a lab appointment with the [facility], please come in thru the Main Entrance and check in at the main information desk.  Wear comfortable clothing and clothing appropriate for easy access to any Portacath or PICC line.   We strive to give you quality time with your provider. You may need to reschedule your appointment if you arrive late (15 or more minutes).  Arriving late affects you and other patients whose appointments are after yours.  Also, if you miss three or more appointments without notifying the office, you may be dismissed from the clinic at the provider's discretion.      For prescription refill requests, have your pharmacy contact our office and allow 72 hours for refills to be completed.    Today you received the following chemotherapy and/or immunotherapy agents Faslodex/Xgeva      To help prevent nausea and vomiting after your treatment, we encourage you to take your nausea medication as directed.  BELOW ARE SYMPTOMS THAT SHOULD BE REPORTED IMMEDIATELY: *FEVER GREATER THAN 100.4 F (38 C) OR HIGHER *CHILLS OR SWEATING *NAUSEA AND VOMITING THAT IS NOT CONTROLLED WITH YOUR NAUSEA MEDICATION *UNUSUAL SHORTNESS OF BREATH *UNUSUAL BRUISING OR BLEEDING *URINARY PROBLEMS (pain or burning when urinating, or frequent urination) *BOWEL PROBLEMS (unusual diarrhea, constipation, pain near the anus) TENDERNESS IN MOUTH AND THROAT WITH OR WITHOUT PRESENCE OF ULCERS (sore throat, sores in mouth, or a toothache) UNUSUAL RASH, SWELLING OR PAIN  UNUSUAL VAGINAL DISCHARGE OR ITCHING   Items with * indicate a potential emergency and should be followed up as soon as possible or go to the Emergency Department if any problems should occur.  Please show the CHEMOTHERAPY ALERT CARD or IMMUNOTHERAPY ALERT CARD at check-in to the Emergency  Department and triage nurse.  Should you have questions after your visit or need to cancel or reschedule your appointment, please contact [facility] [phone]  and follow the prompts.  Office hours are 8:00 a.m. to 4:30 p.m. Monday - Friday. Please note that voicemails left after 4:00 p.m. may not be returned until the following business day.  We are closed weekends and major holidays. You have access to a nurse at all times for urgent questions. Please call the main number to the clinic [phone] and follow the prompts.  For any non-urgent questions, you may also contact your provider using MyChart. We now offer e-Visits for anyone 39 and older to request care online for non-urgent symptoms. For details visit [email or web address].   Also download the MyChart app! Go to the app store, search "MyChart", open the app, select [facility], and log in with your MyChart username and password.  Due to Covid, a mask is required upon entering the hospital/clinic. If you do not have a mask, one will be given to you upon arrival. For doctor visits, patients may have 1 support person aged 29 or older with them. For treatment visits, patients cannot have anyone with them due to current Covid guidelines and our immunocompromised population.

## 2021-07-20 ENCOUNTER — Ambulatory Visit (HOSPITAL_COMMUNITY): Payer: Medicaid Other | Admitting: Hematology and Oncology

## 2021-07-23 ENCOUNTER — Other Ambulatory Visit (HOSPITAL_COMMUNITY): Payer: Medicaid Other

## 2021-07-23 ENCOUNTER — Ambulatory Visit (HOSPITAL_COMMUNITY): Payer: Medicaid Other

## 2021-08-10 ENCOUNTER — Ambulatory Visit (HOSPITAL_COMMUNITY)
Admission: RE | Admit: 2021-08-10 | Discharge: 2021-08-10 | Disposition: A | Discharge status: Home / Self Care | Payer: Medicaid Other | Source: Ambulatory Visit | Attending: Hematology and Oncology | Admitting: Hematology and Oncology

## 2021-08-10 ENCOUNTER — Other Ambulatory Visit: Payer: Self-pay

## 2021-08-10 DIAGNOSIS — C50912 Malignant neoplasm of unspecified site of left female breast: Secondary | ICD-10-CM | POA: Diagnosis not present

## 2021-08-10 DIAGNOSIS — C7951 Secondary malignant neoplasm of bone: Secondary | ICD-10-CM | POA: Diagnosis not present

## 2021-08-10 DIAGNOSIS — C50919 Malignant neoplasm of unspecified site of unspecified female breast: Secondary | ICD-10-CM | POA: Diagnosis not present

## 2021-08-10 LAB — GLUCOSE, CAPILLARY: Glucose-Capillary: 99 mg/dL (ref 70–99)

## 2021-08-10 MED ORDER — FLUDEOXYGLUCOSE F - 18 (FDG) INJECTION
8.7000 | Freq: Once | INTRAVENOUS | Status: AC
  Administered 2021-08-10: 8.7 via INTRAVENOUS

## 2021-08-15 NOTE — Progress Notes (Addendum)
[facility] [address] [place] [address]   Patient Care Team: Pcp, No as PCP - General Brien Mates, RN as Oncology Nurse Navigator (Oncology)  SUMMARY OF ONCOLOGIC HISTORY: Oncology History  Metastatic breast cancer ([facility])  07/06/2021 Initial Diagnosis   Metastatic breast cancer ([facility])   07/18/2021 -  Chemotherapy    Patient is on Treatment Plan: BREAST PALBOCICLIB + FULVESTRANT Q28D         CHIEF COMPLIANT: Follow-up of metastatic breast cancer   INTERVAL HISTORY: Wendy Zamora is a 60 y.o. female here today for follow up of her metastatic breast cancer. Her last visit was on 07/06/21 by Dr. Benay Pike.   Today she reports feeling. She reports trouble swallowing solid food and pills; it feels as if they get stuck in her throat. This has occurred since starting Ibrance in December 2021. She reports pain from her midline to her left breast which she attributes to scar tissue; she has not been taking hydrocodone for this pain. She has neuropathy in her hands, and she reports that she is allergic to Gabapentin. She reports muscle spasms in her shoulders and back. She denies weight loss, and reports hot flashes both during the day and at night. She has not been taking Flexeril due to her concern for side-effects. She reports SOB with exertion, and she denies nausea, vomiting, fevers, thrush, and infections.   Her younger sister had breast cancer in her 85s, and a paternal cousin who had breast cancer. Her mother passed away from leukemia at age 27. 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   REVIEW OF SYSTEMS:   Review of Systems  Constitutional:  Positive for fatigue (30%). Negative for appetite change (75%), fever and unexpected weight change.  HENT:   Positive for trouble swallowing.   Respiratory:  Positive for shortness of breath (w/ exertion).   Gastrointestinal:  Positive for constipation. Negative for nausea and vomiting.  Endocrine: Positive  for hot flashes.  Neurological:  Positive for headaches and numbness (tingling in hands).  Psychiatric/Behavioral:  Positive for sleep disturbance (falling and staying asleep).   All other systems reviewed and are negative.  I have reviewed the past medical history, past surgical history, social history and family history with the patient and they are unchanged from previous note.   ALLERGIES:   is allergic to augmentin [amoxicillin-pot clavulanate], ciprofloxacin, claritin [loratadine], gabapentin, keflex [cephalexin], levofloxacin, lisinopril, metoprolol, nexium [esomeprazole], tetracyclines & related, tetracycline hcl, chromium, oxycodone, sulfa antibiotics, latex, and nickel.   MEDICATIONS:  Current Outpatient Medications  Medication Sig Dispense Refill   amLODipine (NORVASC) 5 MG tablet Take 5 mg by mouth daily.     AMLODIPINE BENZOATE PO Take 5 mg by mouth daily.     Ascorbic Acid (VITAMIN C) 1000 MG tablet Take 1,000 mg by mouth daily.     aspirin 81 MG chewable tablet Chew by mouth.     aspirin EC 81 MG tablet Take 81 mg by mouth daily. Swallow whole.     butalbital-acetaminophen-caffeine (FIORICET) 50-325-40 MG tablet Take 1 tablet by mouth as needed for headache.     Calcium-Magnesium-Vitamin D (CALCIUM 1200+D3 PO) Take 1 tablet by mouth daily.     cetirizine (ZYRTEC) 10 MG tablet Take 10 mg by mouth daily.     Cinnamon 500 MG TABS Take 2 tablets by mouth daily.     Cinnamon Bark POWD Take by mouth.     cyanocobalamin 1000 MCG tablet Take 1,000  mcg by mouth daily.     cyanocobalamin 1000 MCG tablet Take 1 tablet by mouth daily.     cyclobenzaprine (FLEXERIL) 10 MG tablet Take 10 mg by mouth as needed for muscle spasms.     denosumab (XGEVA) 120 MG/1.7ML SOLN injection Inject 120 mg into the skin every 30 (thirty) days.     fulvestrant (FASLODEX) 250 MG/5ML injection Inject 500 mg into the muscle every 30 (thirty) days. One injection each buttock over 1-2 minutes. Warm prior to  use.     furosemide (LASIX) 20 MG tablet Take 20 mg by mouth as needed.     lansoprazole (PREVACID SOLUTAB) 30 MG disintegrating tablet 30 mg before breakfast.     lansoprazole (PREVACID) 15 MG capsule Take 15 mg by mouth daily at 12 noon. Pt takes 1-2 tablets daily     naphazoline-pheniramine (NAPHCON-A) 0.025-0.3 % ophthalmic solution Inject into the eye.     NAPHCON-A 0.025-0.3 % ophthalmic solution 2 drops 3 (three) times daily.     palbociclib (IBRANCE) 125 MG tablet Take 125 mg by mouth daily. Take for 21 days on, 7 days off, repeat every 28 days.     PROAIR HFA 108 (90 Base) MCG/ACT inhaler SMARTSIG:2 Puff(s) By Mouth Every 4 Hours PRN     triamcinolone cream (KENALOG) 0.1 %      cyclobenzaprine (FLEXERIL) 5 MG tablet Take 5 mg by mouth 3 (three) times daily as needed. (Patient not taking: Reported on 08/16/2021)     HYDROcodone-acetaminophen (NORCO/VICODIN) 5-325 MG tablet Take 1-2 tablets by mouth every 6 (six) hours as needed for moderate pain. (Patient not taking: Reported on 08/16/2021)     No current facility-administered medications for this visit.     PHYSICAL EXAMINATION: Performance status (ECOG): 0 - Asymptomatic  Vitals:   08/16/21 1103  BP: 113/70  Pulse: 83  Resp: 18  Temp: (!) 97 F (36.1 C)  SpO2: 98%   Wt Readings from Last 3 Encounters:  08/16/21 177 lb 14.6 oz (80.7 kg)  07/06/21 175 lb 12.8 oz (79.7 kg)   Physical Exam Vitals reviewed.  Constitutional:      Appearance: Normal appearance.  Cardiovascular:     Rate and Rhythm: Normal rate and regular rhythm.     Pulses: Normal pulses.     Heart sounds: Normal heart sounds.  Pulmonary:     Effort: Pulmonary effort is normal.     Breath sounds: Normal breath sounds.  Abdominal:     Palpations: Abdomen is soft. There is no hepatomegaly, splenomegaly or mass.     Tenderness: There is no abdominal tenderness.  Lymphadenopathy:     Cervical: No cervical adenopathy.     Right cervical: No superficial  cervical adenopathy.    Left cervical: No superficial cervical adenopathy.     Upper Body:     Right upper body: No supraclavicular, axillary or pectoral adenopathy.     Left upper body: No supraclavicular, axillary or pectoral adenopathy.     Lower Body: No right inguinal adenopathy. No left inguinal adenopathy.  Neurological:     General: No focal deficit present.     Mental Status: She is alert and oriented to person, place, and time.  Psychiatric:        Mood and Affect: Mood normal.        Behavior: Behavior normal.    Breast Exam Chaperone: Thana Ates     LABORATORY DATA:  I have reviewed the data as listed CMP Latest Ref Rng &  Units 08/16/2021 07/18/2021 07/06/2021  Glucose 70 - 99 mg/dL 98 102(H) 86  BUN 6 - 20 mg/dL '12 10 14  ' Creatinine 0.44 - 1.00 mg/dL 0.73 0.74 0.97  Sodium 135 - 145 mmol/L 140 138 139  Potassium 3.5 - 5.1 mmol/L 3.8 3.9 3.8  Chloride 98 - 111 mmol/L 104 107 107  CO2 22 - 32 mmol/L '26 23 24  ' Calcium 8.9 - 10.3 mg/dL 9.7 8.9 9.4  Total Protein 6.5 - 8.1 g/dL 7.2 7.1 7.3  Total Bilirubin 0.3 - 1.2 mg/dL 0.5 0.4 0.7  Alkaline Phos 38 - 126 U/L 56 57 56  AST 15 - 41 U/L '19 18 16  ' ALT 0 - 44 U/L '15 15 16   ' Lab Results  Component Value Date   CAN153 34.0 (H) 07/06/2021   Lab Results  Component Value Date   WBC 3.2 (L) 08/16/2021   HGB 13.0 08/16/2021   HCT 36.6 08/16/2021   MCV 106.7 (H) 08/16/2021   PLT 227 08/16/2021   NEUTROABS 1.4 (L) 08/16/2021    ASSESSMENT:  1.  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 - Started on Ibrance and Faslodex in December 2021 for metastatic disease. - PET scan on 08/10/2021 with no evidence of hypermetabolic metastatic disease.  Widespread scattered sclerotic bone metastasis in the spine  and pelvis are stable and not metabolically active.  No lymphadenopathy.  Asymmetric muscular uptake in the left lower scalene muscles and in the intercostal muscles of the ventral upper left chest wall without discrete mass correlate on the CT images, favoring activity related uptake.  2.  Social/family history: - She is a retired Building control surveyor.  She recently moved back from [place]. - Younger sister had breast cancer in her 10s.  Mother died of leukemia at age 53.  Paternal cousin also had breast cancer.  3.  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  She underwent immediate implants which were later removed on 12/10/2019 secondary to infection and necrosis. - XRT to the chest wall and regional lymph nodes, [address] [place], from 02/29/2020 through 04/12/2020 - She was treated with adjuvant anastrozole which was started 12/09/2019 and was found to have metastatic disease in November 2021. - Previous genetic testing was reportedly negative and [place].   PLAN:  1.  Metastatic breast cancer to the bones, ER/PR positive, HER2 negative: - Tumor markers on 07/06/2021 shows CA 15-3 34, CA 27-29 at 48.  Reviewed labs from today which showed normal LFTs, calcium and creatinine.  White count is low at 3.2 with ANC of 1.4, likely from [place]. - We discussed PET scan results which did not show any hypermetabolic activity. - Recommend continuing Ibrance 125 mg 3 weeks on 1 week off along with Faslodex. - We will closely monitor tumor markers.  RTC 8 weeks for follow-up with repeat labs.  2.  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  Neuropathy is predominantly in the hands.  She is allergic to gabapentin. - If there is any worsening we will consider Cymbalta.  3.  Bone metastatic disease: - Continue denosumab monthly. - Continue calcium and vitamin D supplements.  4.  Hot flashes: - Continue Effexor 37.5 mg daily.  5.  Dysphagia: - She reports food getting stuck behind the sternum for the past few months.  No weight loss. - We will make a referral to GI for endoscopy.  Breast Cancer therapy associated bone loss: I have recommended calcium, Vitamin D and weight bearing exercises.  Orders placed this encounter:  No orders of the defined types were placed in this encounter.  Total time spent is 40 minutes with more than 50% of the time spent face-to-face in addition to review of records and discussion of management plan. The patient has a good understanding of the overall plan. She agrees with it. She will call with any problems that may develop before the next visit here.  Derek Jack, MD [facility] [phone]   I, Thana Ates, am acting as a scribe for Dr. Derek Jack.  I, Derek Jack MD, have reviewed the above documentation for accuracy and completeness, and I agree with the above.

## 2021-08-16 ENCOUNTER — Inpatient Hospital Stay (HOSPITAL_BASED_OUTPATIENT_CLINIC_OR_DEPARTMENT_OTHER): Payer: Medicaid Other | Admitting: Hematology

## 2021-08-16 ENCOUNTER — Inpatient Hospital Stay (HOSPITAL_COMMUNITY): Payer: Medicaid Other

## 2021-08-16 ENCOUNTER — Other Ambulatory Visit: Payer: Self-pay

## 2021-08-16 ENCOUNTER — Inpatient Hospital Stay (HOSPITAL_COMMUNITY): Payer: Medicaid Other | Attending: Hematology

## 2021-08-16 VITALS — BP 113/70 | HR 83 | Temp 97.0°F | Resp 18 | Wt 177.9 lb

## 2021-08-16 DIAGNOSIS — Z79899 Other long term (current) drug therapy: Secondary | ICD-10-CM | POA: Diagnosis not present

## 2021-08-16 DIAGNOSIS — Z5111 Encounter for antineoplastic chemotherapy: Secondary | ICD-10-CM | POA: Insufficient documentation

## 2021-08-16 DIAGNOSIS — Z170 Estrogen receptor positive status [ER+]: Secondary | ICD-10-CM | POA: Insufficient documentation

## 2021-08-16 DIAGNOSIS — Z7982 Long term (current) use of aspirin: Secondary | ICD-10-CM | POA: Diagnosis not present

## 2021-08-16 DIAGNOSIS — C50919 Malignant neoplasm of unspecified site of unspecified female breast: Secondary | ICD-10-CM

## 2021-08-16 DIAGNOSIS — C7951 Secondary malignant neoplasm of bone: Secondary | ICD-10-CM | POA: Insufficient documentation

## 2021-08-16 LAB — CBC WITH DIFFERENTIAL/PLATELET
Abs Immature Granulocytes: 0.02 10*3/uL (ref 0.00–0.07)
Basophils Absolute: 0.1 10*3/uL (ref 0.0–0.1)
Basophils Relative: 2 %
Eosinophils Absolute: 0 10*3/uL (ref 0.0–0.5)
Eosinophils Relative: 1 %
HCT: 36.6 % (ref 36.0–46.0)
Hemoglobin: 13 g/dL (ref 12.0–15.0)
Immature Granulocytes: 1 %
Lymphocytes Relative: 36 %
Lymphs Abs: 1.1 10*3/uL (ref 0.7–4.0)
MCH: 37.9 pg — ABNORMAL HIGH (ref 26.0–34.0)
MCHC: 35.5 g/dL (ref 30.0–36.0)
MCV: 106.7 fL — ABNORMAL HIGH (ref 80.0–100.0)
Monocytes Absolute: 0.6 10*3/uL (ref 0.1–1.0)
Monocytes Relative: 18 %
Neutro Abs: 1.4 10*3/uL — ABNORMAL LOW (ref 1.7–7.7)
Neutrophils Relative %: 42 %
Platelets: 227 10*3/uL (ref 150–400)
RBC: 3.43 MIL/uL — ABNORMAL LOW (ref 3.87–5.11)
RDW: 14 % (ref 11.5–15.5)
WBC: 3.2 10*3/uL — ABNORMAL LOW (ref 4.0–10.5)
nRBC: 0 % (ref 0.0–0.2)

## 2021-08-16 LAB — COMPREHENSIVE METABOLIC PANEL
ALT: 15 U/L (ref 0–44)
AST: 19 U/L (ref 15–41)
Albumin: 4.2 g/dL (ref 3.5–5.0)
Alkaline Phosphatase: 56 U/L (ref 38–126)
Anion gap: 10 (ref 5–15)
BUN: 12 mg/dL (ref 6–20)
CO2: 26 mmol/L (ref 22–32)
Calcium: 9.7 mg/dL (ref 8.9–10.3)
Chloride: 104 mmol/L (ref 98–111)
Creatinine, Ser: 0.73 mg/dL (ref 0.44–1.00)
GFR, Estimated: >60 mL/min (ref >60)
Glucose, Bld: 98 mg/dL (ref 70–99)
Potassium: 3.8 mmol/L (ref 3.5–5.1)
Sodium: 140 mmol/L (ref 135–145)
Total Bilirubin: 0.5 mg/dL (ref 0.3–1.2)
Total Protein: 7.2 g/dL (ref 6.5–8.1)

## 2021-08-16 MED ORDER — FULVESTRANT 250 MG/5ML IM SOLN
500.0000 mg | Freq: Once | INTRAMUSCULAR | Status: AC
Start: 2021-08-16 — End: 2021-08-16
  Administered 2021-08-16: 500 mg via INTRAMUSCULAR
  Filled 2021-08-16: qty 10

## 2021-08-16 MED ORDER — DENOSUMAB 120 MG/1.7ML ~~LOC~~ SOLN
120.0000 mg | Freq: Once | SUBCUTANEOUS | Status: AC
Start: 2021-08-16 — End: 2021-08-16
  Administered 2021-08-16: 120 mg via SUBCUTANEOUS
  Filled 2021-08-16: qty 1.7

## 2021-08-16 NOTE — Patient Instructions (Signed)
[facility]  Discharge Instructions: Thank you for choosing [facility] to provide your oncology and hematology care.  If you have a lab appointment with the [facility], please come in thru the Main Entrance and check in at the main information desk.  Wear comfortable clothing and clothing appropriate for easy access to any Portacath or PICC line.   We strive to give you quality time with your provider. You may need to reschedule your appointment if you arrive late (15 or more minutes).  Arriving late affects you and other patients whose appointments are after yours.  Also, if you miss three or more appointments without notifying the office, you may be dismissed from the clinic at the provider's discretion.      For prescription refill requests, have your pharmacy contact our office and allow 72 hours for refills to be completed.    Today you received the following:  Xgeva and Faslodex injections.      To help prevent nausea and vomiting after your treatment, we encourage you to take your nausea medication as directed.  BELOW ARE SYMPTOMS THAT SHOULD BE REPORTED IMMEDIATELY: *FEVER GREATER THAN 100.4 F (38 C) OR HIGHER *CHILLS OR SWEATING *NAUSEA AND VOMITING THAT IS NOT CONTROLLED WITH YOUR NAUSEA MEDICATION *UNUSUAL SHORTNESS OF BREATH *UNUSUAL BRUISING OR BLEEDING *URINARY PROBLEMS (pain or burning when urinating, or frequent urination) *BOWEL PROBLEMS (unusual diarrhea, constipation, pain near the anus) TENDERNESS IN MOUTH AND THROAT WITH OR WITHOUT PRESENCE OF ULCERS (sore throat, sores in mouth, or a toothache) UNUSUAL RASH, SWELLING OR PAIN  UNUSUAL VAGINAL DISCHARGE OR ITCHING   Items with * indicate a potential emergency and should be followed up as soon as possible or go to the Emergency Department if any problems should occur.  Please show the CHEMOTHERAPY ALERT CARD or IMMUNOTHERAPY ALERT CARD at check-in to the Emergency Department and triage  nurse.  Should you have questions after your visit or need to cancel or reschedule your appointment, please contact [facility] [phone]  and follow the prompts.  Office hours are 8:00 a.m. to 4:30 p.m. Monday - Friday. Please note that voicemails left after 4:00 p.m. may not be returned until the following business day.  We are closed weekends and major holidays. You have access to a nurse at all times for urgent questions. Please call the main number to the clinic [phone] and follow the prompts.  For any non-urgent questions, you may also contact your provider using MyChart. We now offer e-Visits for anyone 1 and older to request care online for non-urgent symptoms. For details visit [email or web address].   Also download the MyChart app! Go to the app store, search "MyChart", open the app, select [facility], and log in with your MyChart username and password.  Due to Covid, a mask is required upon entering the hospital/clinic. If you do not have a mask, one will be given to you upon arrival. For doctor visits, patients may have 1 support person aged 78 or older with them. For treatment visits, patients cannot have anyone with them due to current Covid guidelines and our immunocompromised population.

## 2021-08-16 NOTE — Patient Instructions (Addendum)
[facility] at [facility] Discharge Instructions  You were seen today by Dr. Delton Coombes. He went over your recent results and scans, and you received your injections. You will be referred for a GI consultation. Dr. Delton Coombes will see you back in 2 months for labs and follow up.   Thank you for choosing [facility] at [facility] to provide your oncology and hematology care.  To afford each patient quality time with our provider, please arrive at least 15 minutes before your scheduled appointment time.   If you have a lab appointment with the [facility] please come in thru the Main Entrance and check in at the main information desk  You need to re-schedule your appointment should you arrive 10 or more minutes late.  We strive to give you quality time with our providers, and arriving late affects you and other patients whose appointments are after yours.  Also, if you no show three or more times for appointments you may be dismissed from the clinic at the providers discretion.     Again, thank you for choosing [facility].  Our hope is that these requests will decrease the amount of time that you wait before being seen by our physicians.       _____________________________________________________________  Should you have questions after your visit to [facility], please contact our office at [phone] between the hours of 8:00 a.m. and 4:30 p.m.  Voicemails left after 4:00 p.m. will not be returned until the following business day.  For prescription refill requests, have your pharmacy contact our office and allow 72 hours.    Cancer Center Support Programs:   > Cancer Support Group  2nd Tuesday of the month 1pm-2pm, Journey Room

## 2021-08-16 NOTE — Progress Notes (Signed)
Wendy Zamora presents today for injection per the provider's orders.  Faslodex and Xgeva administration without incident; injection site WNL; see MAR for injection details.  Remained stable throughout procedure.  Patient tolerated procedure well and without incident.  No questions or complaints noted at this time. Discharged ambulatory in stable condition.

## 2021-08-17 ENCOUNTER — Encounter (INDEPENDENT_AMBULATORY_CARE_PROVIDER_SITE_OTHER): Payer: Self-pay | Admitting: *Deleted

## 2021-08-19 ENCOUNTER — Encounter (HOSPITAL_COMMUNITY): Payer: Self-pay | Admitting: Hematology and Oncology

## 2021-08-20 DIAGNOSIS — I972 Postmastectomy lymphedema syndrome: Secondary | ICD-10-CM | POA: Diagnosis not present

## 2021-09-13 ENCOUNTER — Other Ambulatory Visit (HOSPITAL_COMMUNITY): Payer: Self-pay | Admitting: *Deleted

## 2021-09-13 ENCOUNTER — Inpatient Hospital Stay (HOSPITAL_COMMUNITY): Payer: Medicaid Other

## 2021-09-13 ENCOUNTER — Other Ambulatory Visit: Payer: Self-pay

## 2021-09-13 ENCOUNTER — Inpatient Hospital Stay (HOSPITAL_COMMUNITY): Payer: Medicaid Other | Attending: Hematology

## 2021-09-13 VITALS — BP 129/66 | HR 68 | Temp 97.4°F | Resp 18 | Wt 177.9 lb

## 2021-09-13 DIAGNOSIS — Z170 Estrogen receptor positive status [ER+]: Secondary | ICD-10-CM | POA: Insufficient documentation

## 2021-09-13 DIAGNOSIS — Z5111 Encounter for antineoplastic chemotherapy: Secondary | ICD-10-CM | POA: Insufficient documentation

## 2021-09-13 DIAGNOSIS — C50919 Malignant neoplasm of unspecified site of unspecified female breast: Secondary | ICD-10-CM

## 2021-09-13 DIAGNOSIS — C50912 Malignant neoplasm of unspecified site of left female breast: Secondary | ICD-10-CM | POA: Diagnosis not present

## 2021-09-13 DIAGNOSIS — Z923 Personal history of irradiation: Secondary | ICD-10-CM | POA: Insufficient documentation

## 2021-09-13 DIAGNOSIS — Z79811 Long term (current) use of aromatase inhibitors: Secondary | ICD-10-CM | POA: Diagnosis not present

## 2021-09-13 DIAGNOSIS — Z79899 Other long term (current) drug therapy: Secondary | ICD-10-CM | POA: Insufficient documentation

## 2021-09-13 DIAGNOSIS — C7951 Secondary malignant neoplasm of bone: Secondary | ICD-10-CM | POA: Insufficient documentation

## 2021-09-13 LAB — CBC WITH DIFFERENTIAL/PLATELET
Basophils Absolute: 0 10*3/uL (ref 0.0–0.1)
Basophils Relative: 0 %
Eosinophils Absolute: 0 10*3/uL (ref 0.0–0.5)
Eosinophils Relative: 0 %
HCT: 39 % (ref 36.0–46.0)
Hemoglobin: 13.3 g/dL (ref 12.0–15.0)
Lymphocytes Relative: 42 %
Lymphs Abs: 1 10*3/uL (ref 0.7–4.0)
MCH: 37.2 pg — ABNORMAL HIGH (ref 26.0–34.0)
MCHC: 34.1 g/dL (ref 30.0–36.0)
MCV: 108.9 fL — ABNORMAL HIGH (ref 80.0–100.0)
Monocytes Absolute: 0.3 10*3/uL (ref 0.1–1.0)
Monocytes Relative: 14 %
Neutro Abs: 1.1 10*3/uL — ABNORMAL LOW (ref 1.7–7.7)
Neutrophils Relative %: 44 %
Platelets: 225 10*3/uL (ref 150–400)
RBC: 3.58 MIL/uL — ABNORMAL LOW (ref 3.87–5.11)
RDW: 13.7 % (ref 11.5–15.5)
WBC: 2.4 10*3/uL — ABNORMAL LOW (ref 4.0–10.5)
nRBC: 0 % (ref 0.0–0.2)

## 2021-09-13 LAB — COMPREHENSIVE METABOLIC PANEL
ALT: 19 U/L (ref 0–44)
AST: 22 U/L (ref 15–41)
Albumin: 4.2 g/dL (ref 3.5–5.0)
Alkaline Phosphatase: 64 U/L (ref 38–126)
Anion gap: 9 (ref 5–15)
BUN: 9 mg/dL (ref 6–20)
CO2: 26 mmol/L (ref 22–32)
Calcium: 9.5 mg/dL (ref 8.9–10.3)
Chloride: 106 mmol/L (ref 98–111)
Creatinine, Ser: 0.72 mg/dL (ref 0.44–1.00)
GFR, Estimated: >60 mL/min (ref >60)
Glucose, Bld: 91 mg/dL (ref 70–99)
Potassium: 3.8 mmol/L (ref 3.5–5.1)
Sodium: 141 mmol/L (ref 135–145)
Total Bilirubin: 0.4 mg/dL (ref 0.3–1.2)
Total Protein: 7.3 g/dL (ref 6.5–8.1)

## 2021-09-13 MED ORDER — CYANOCOBALAMIN 1000 MCG PO TABS: 1000.0000 ug | ORAL_TABLET | Freq: Every day | ORAL | 6 refills | Status: DC

## 2021-09-13 MED ORDER — DENOSUMAB 120 MG/1.7ML ~~LOC~~ SOLN
120.0000 mg | Freq: Once | SUBCUTANEOUS | Status: AC
  Administered 2021-09-13: 120 mg via SUBCUTANEOUS
  Filled 2021-09-13: qty 1.7

## 2021-09-13 MED ORDER — CYANOCOBALAMIN 1000 MCG PO TABS: 1000.0000 ug | ORAL_TABLET | Freq: Every day | ORAL | 6 refills | Status: AC

## 2021-09-13 MED ORDER — FULVESTRANT 250 MG/5ML IM SOLN
500.0000 mg | Freq: Once | INTRAMUSCULAR | Status: AC
  Administered 2021-09-13: 500 mg via INTRAMUSCULAR
  Filled 2021-09-13: qty 10

## 2021-09-13 NOTE — Patient Instructions (Signed)
[facility]  Discharge Instructions: Thank you for choosing [facility] to provide your oncology and hematology care.  If you have a lab appointment with the [facility], please come in thru the Main Entrance and check in at the main information desk.  Wear comfortable clothing and clothing appropriate for easy access to any Portacath or PICC line.   We strive to give you quality time with your provider. You may need to reschedule your appointment if you arrive late (15 or more minutes).  Arriving late affects you and other patients whose appointments are after yours.  Also, if you miss three or more appointments without notifying the office, you may be dismissed from the clinic at the provider's discretion.      For prescription refill requests, have your pharmacy contact our office and allow 72 hours for refills to be completed.    Today you received the following chemotherapy and/or immunotherapy agents Faslodex and Xgeva      To help prevent nausea and vomiting after your treatment, we encourage you to take your nausea medication as directed.  BELOW ARE SYMPTOMS THAT SHOULD BE REPORTED IMMEDIATELY: *FEVER GREATER THAN 100.4 F (38 C) OR HIGHER *CHILLS OR SWEATING *NAUSEA AND VOMITING THAT IS NOT CONTROLLED WITH YOUR NAUSEA MEDICATION *UNUSUAL SHORTNESS OF BREATH *UNUSUAL BRUISING OR BLEEDING *URINARY PROBLEMS (pain or burning when urinating, or frequent urination) *BOWEL PROBLEMS (unusual diarrhea, constipation, pain near the anus) TENDERNESS IN MOUTH AND THROAT WITH OR WITHOUT PRESENCE OF ULCERS (sore throat, sores in mouth, or a toothache) UNUSUAL RASH, SWELLING OR PAIN  UNUSUAL VAGINAL DISCHARGE OR ITCHING   Items with * indicate a potential emergency and should be followed up as soon as possible or go to the Emergency Department if any problems should occur.  Please show the CHEMOTHERAPY ALERT CARD or IMMUNOTHERAPY ALERT CARD at check-in to the  Emergency Department and triage nurse.  Should you have questions after your visit or need to cancel or reschedule your appointment, please contact [facility] [phone]  and follow the prompts.  Office hours are 8:00 a.m. to 4:30 p.m. Monday - Friday. Please note that voicemails left after 4:00 p.m. may not be returned until the following business day.  We are closed weekends and major holidays. You have access to a nurse at all times for urgent questions. Please call the main number to the clinic [phone] and follow the prompts.  For any non-urgent questions, you may also contact your provider using MyChart. We now offer e-Visits for anyone 76 and older to request care online for non-urgent symptoms. For details visit [email or web address].   Also download the MyChart app! Go to the app store, search "MyChart", open the app, select [facility], and log in with your MyChart username and password.  Due to Covid, a mask is required upon entering the hospital/clinic. If you do not have a mask, one will be given to you upon arrival. For doctor visits, patients may have 1 support person aged 29 or older with them. For treatment visits, patients cannot have anyone with them due to current Covid guidelines and our immunocompromised population.

## 2021-09-13 NOTE — Progress Notes (Signed)
Patient presents today for Xgeva and Faslodex injections per providers order.  Calcium noted to be 9.5, patient has had no jaw pain or dental work and is taking calcium and vitamin D supplements. Stable during administration without incident; injection sites WNL; see MAR for injection details.  Patient tolerated procedure well and without incident.  No questions or complaints noted at this time. Discharge from clinic ambulatory in stable condition.  Alert and oriented X 3.  Follow up with [facility] as scheduled.

## 2021-09-20 DIAGNOSIS — I972 Postmastectomy lymphedema syndrome: Secondary | ICD-10-CM | POA: Diagnosis not present

## 2021-10-10 NOTE — Progress Notes (Signed)
[facility] [address], [place] [address]   CLINIC:  Medical Oncology/Hematology  PCP:  Pcp, No None None   REASON FOR VISIT:  Follow-up for metastatic breast cancer  PRIOR THERAPY: none  NGS Results: not done  CURRENT THERAPY: Palbociclib + Fulvestrant every 4 weeks  BRIEF ONCOLOGIC HISTORY:  Oncology History  Metastatic breast cancer (HCC)  07/06/2021 Initial Diagnosis   Metastatic breast cancer (HCC)   07/18/2021 -  Chemotherapy    Patient is on Treatment Plan: BREAST PALBOCICLIB + FULVESTRANT Q28D         CANCER STAGING: Cancer Staging No matching staging information was found for the patient.  INTERVAL HISTORY:  Wendy Zamora, a 60 y.o. female, returns for routine follow-up and consideration for next cycle of chemotherapy. Wendy Zamora was last seen on 08/16/2021.  Due for cycle #4 of Palbociclib + Fulvestrant today.   Overall, she tells me she has been feeling pretty well. Today she reports feeling well. She reports hair thinning, trouble swallowing, and pain in chest, back, and shoulders. She reports losing 3 pounds  since her last visit.   Overall, she feels ready for next cycle of chemo today.   REVIEW OF SYSTEMS:  Review of Systems  Constitutional:  Positive for fatigue (40%) and unexpected weight change (-3 lbs). Negative for appetite change (75%).  HENT:   Positive for trouble swallowing.   Respiratory:  Positive for cough and shortness of breath.   Cardiovascular:  Positive for chest pain (6/10).  Gastrointestinal:  Positive for constipation.  Genitourinary:  Positive for frequency.   Musculoskeletal:  Positive for arthralgias (6/10 shoulders) and back pain (6/10).  Neurological:  Positive for headaches and numbness.  Psychiatric/Behavioral:  Positive for sleep disturbance.   All other systems reviewed and are negative.  PAST MEDICAL/SURGICAL HISTORY:  Past Medical History:  Diagnosis Date   Allergy    Bone metastasis  (HCC)    Breast cancer (HCC)    GERD (gastroesophageal reflux disease)    Hypertension    Lymphedema of arm    left arm   Osteopenia    Past Surgical History:  Procedure Laterality Date   CARPAL TUNNEL RELEASE Bilateral 2006   hysteroscopy     mastectomy Bilateral 10/22/2019   pt had necrosis after 1st surgery, had another surgery to remove implants   TUBAL LIGATION Bilateral     SOCIAL HISTORY:  Social History   Socioeconomic History   Marital status: Married    Spouse name: Not on file   Number of children: 2   Years of education: Not on file   Highest education level: Not on file  Occupational History   Occupation: Disability  Tobacco Use   Smoking status: Every Day    Packs/day: 0.50    Years: 40.00    Pack years: 20.00    Types: Cigarettes   Smokeless tobacco: Never  Vaping Use   Vaping Use: Never used  Substance and Sexual Activity   Alcohol use: Never   Drug use: Never   Sexual activity: Not on file  Other Topics Concern   Not on file  Social History Narrative   Not on file   Social Determinants of Health   Financial Resource Strain: Low Risk    Difficulty of Paying Living Expenses: Not hard at all  Food Insecurity: No Food Insecurity   Worried About Radiation protection practitioner of Food in the Last Year: Never true   Ran Out of Food in the  Last Year: Never true  Transportation Needs: No Transportation Needs   Lack of Transportation (Medical): No   Lack of Transportation (Non-Medical): No  Physical Activity: Insufficiently Active   Days of Exercise per Week: 5 days   Minutes of Exercise per Session: 10 min  Stress: No Stress Concern Present   Feeling of Stress : Only a little  Social Connections: Moderately Isolated   Frequency of Communication with Friends and Family: Three times a week   Frequency of Social Gatherings with Friends and Family: Once a week   Attends Religious Services: Never   Marine scientist or Organizations: No   Attends Programme researcher, broadcasting/film/video: Never   Marital Status: Married  Human resources officer Violence: Not At Risk   Fear of Current or Ex-Partner: No   Emotionally Abused: No   Physically Abused: No   Sexually Abused: No    FAMILY HISTORY:  No family history on file.  CURRENT MEDICATIONS:  Current Outpatient Medications  Medication Sig Dispense Refill   amLODipine (NORVASC) 5 MG tablet Take 5 mg by mouth daily.     AMLODIPINE BENZOATE PO Take 5 mg by mouth daily.     Ascorbic Acid (VITAMIN C) 1000 MG tablet Take 1,000 mg by mouth daily.     aspirin 81 MG chewable tablet Chew by mouth.     aspirin EC 81 MG tablet Take 81 mg by mouth daily. Swallow whole.     butalbital-acetaminophen-caffeine (FIORICET) 50-325-40 MG tablet Take 1 tablet by mouth as needed for headache.     Calcium-Magnesium-Vitamin D (CALCIUM 1200+D3 PO) Take 1 tablet by mouth daily.     cetirizine (ZYRTEC) 10 MG tablet Take 10 mg by mouth daily.     Cinnamon 500 MG TABS Take 2 tablets by mouth daily.     Cinnamon Bark POWD Take by mouth.     cyanocobalamin 1000 MCG tablet Take 1 tablet (1,000 mcg total) by mouth daily. 30 tablet 6   cyanocobalamin 1000 MCG tablet Take 1 tablet (1,000 mcg total) by mouth daily. 30 tablet 6   cyclobenzaprine (FLEXERIL) 10 MG tablet Take 10 mg by mouth as needed for muscle spasms.     cyclobenzaprine (FLEXERIL) 5 MG tablet Take 5 mg by mouth 3 (three) times daily as needed.     denosumab (XGEVA) 120 MG/1.7ML SOLN injection Inject 120 mg into the skin every 30 (thirty) days.     fulvestrant (FASLODEX) 250 MG/5ML injection Inject 500 mg into the muscle every 30 (thirty) days. One injection each buttock over 1-2 minutes. Warm prior to use.     furosemide (LASIX) 20 MG tablet Take 20 mg by mouth as needed.     HYDROcodone-acetaminophen (NORCO/VICODIN) 5-325 MG tablet Take 1-2 tablets by mouth every 6 (six) hours as needed for moderate pain.     lansoprazole (PREVACID SOLUTAB) 30 MG disintegrating tablet 30 mg  before breakfast.     lansoprazole (PREVACID) 15 MG capsule Take 15 mg by mouth daily at 12 noon. Pt takes 1-2 tablets daily     naphazoline-pheniramine (NAPHCON-A) 0.025-0.3 % ophthalmic solution Inject into the eye.     NAPHCON-A 0.025-0.3 % ophthalmic solution 2 drops 3 (three) times daily.     palbociclib (IBRANCE) 125 MG tablet Take 125 mg by mouth daily. Take for 21 days on, 7 days off, repeat every 28 days.     PROAIR HFA 108 (90 Base) MCG/ACT inhaler SMARTSIG:2 Puff(s) By Mouth Every 4 Hours PRN  triamcinolone cream (KENALOG) 0.1 %      No current facility-administered medications for this visit.    ALLERGIES:  Allergies  Allergen Reactions   Augmentin [Amoxicillin-Pot Clavulanate] Anaphylaxis   Ciprofloxacin Anaphylaxis   Claritin [Loratadine] Anaphylaxis   Gabapentin Anaphylaxis   Keflex [Cephalexin] Anaphylaxis   Levofloxacin Anaphylaxis   Lisinopril Anaphylaxis   Metoprolol Anaphylaxis   Nexium [Esomeprazole] Anaphylaxis   Tetracyclines & Related Anaphylaxis   Tetracycline Hcl     Tongue swelling     Chromium Other (See Comments)    headache   Oxycodone Itching   Sulfa Antibiotics    Latex Rash    itching   Nickel Rash    itching    PHYSICAL EXAM:  Performance status (ECOG): 0 - Asymptomatic  There were no vitals filed for this visit. Wt Readings from Last 3 Encounters:  09/13/21 177 lb 14.6 oz (80.7 kg)  08/16/21 177 lb 14.6 oz (80.7 kg)  07/06/21 175 lb 12.8 oz (79.7 kg)   Physical Exam Vitals reviewed.  Constitutional:      Appearance: Normal appearance.  Cardiovascular:     Rate and Rhythm: Normal rate and regular rhythm.     Pulses: Normal pulses.     Heart sounds: Normal heart sounds.  Pulmonary:     Effort: Pulmonary effort is normal.     Breath sounds: Normal breath sounds.  Musculoskeletal:     Cervical back: Neck supple.  Lymphadenopathy:     Cervical: No cervical adenopathy.     Right cervical: No superficial cervical adenopathy.     Left cervical: No superficial cervical adenopathy.     Upper Body:     Right upper body: No supraclavicular, axillary or pectoral adenopathy.     Left upper body: No supraclavicular, axillary or pectoral adenopathy.  Neurological:     General: No focal deficit present.     Mental Status: She is alert and oriented to person, place, and time.  Psychiatric:        Mood and Affect: Mood normal.        Behavior: Behavior normal.    LABORATORY DATA:  I have reviewed the labs as listed.  CBC Latest Ref Rng & Units 09/13/2021 08/16/2021 07/18/2021  WBC 4.0 - 10.5 K/uL 2.4(L) 3.2(L) 3.2(L)  Hemoglobin 12.0 - 15.0 g/dL 13.3 13.0 13.1  Hematocrit 36.0 - 46.0 % 39.0 36.6 38.5  Platelets 150 - 400 K/uL 225 227 223   CMP Latest Ref Rng & Units 09/13/2021 08/16/2021 07/18/2021  Glucose 70 - 99 mg/dL 91 98 102(H)  BUN 6 - 20 mg/dL $Remove'9 12 10  'aqpiNKH$ Creatinine 0.44 - 1.00 mg/dL 0.72 0.73 0.74  Sodium 135 - 145 mmol/L 141 140 138  Potassium 3.5 - 5.1 mmol/L 3.8 3.8 3.9  Chloride 98 - 111 mmol/L 106 104 107  CO2 22 - 32 mmol/L $RemoveB'26 26 23  'AOuMREaY$ Calcium 8.9 - 10.3 mg/dL 9.5 9.7 8.9  Total Protein 6.5 - 8.1 g/dL 7.3 7.2 7.1  Total Bilirubin 0.3 - 1.2 mg/dL 0.4 0.5 0.4  Alkaline Phos 38 - 126 U/L 64 56 57  AST 15 - 41 U/L $Remo'22 19 18  'BopkS$ ALT 0 - 44 U/L $Remo'19 15 15    'bDrbI$ DIAGNOSTIC IMAGING:  I have independently reviewed the scans and discussed with the patient. No results found.   ASSESSMENT:  1.  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 - Started on Ibrance and Faslodex in December 2021 for metastatic disease. - PET scan on 08/10/2021 with no evidence of hypermetabolic metastatic disease.  Widespread scattered sclerotic bone metastasis in the spine and pelvis are stable and not metabolically active.  No  lymphadenopathy.  Asymmetric muscular uptake in the left lower scalene muscles and in the intercostal muscles of the ventral upper left chest wall without discrete mass correlate on the CT images, favoring activity related uptake.  2.  Social/family history: - She is a retired Building control surveyor.  She recently moved back from [place]. - Younger sister had breast cancer in her 20s.  Mother died of leukemia at age 66.  Paternal cousin also had breast cancer.  3.  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  She underwent immediate implants which were later removed on 12/10/2019 secondary to infection and necrosis. - XRT to the chest wall and regional lymph nodes, 60 [address], from 02/29/2020 through 04/12/2020 - She was treated with adjuvant anastrozole which was started 12/09/2019 and was found to have metastatic disease in November 2021. - Previous genetic testing was reportedly negative and [place].   PLAN:  1.  Metastatic breast cancer to the bones, ER/PR positive, HER2 negative: - PET scan in August was negative for any hypermetabolic activity. - She is taking Ibrance 125 mg 21 days on/7 days off. - Last CA 15-3 was 31.6 and CA 27-29 was 37.3.  CBC today shows white count 2.8 with ANC of 1200.  Platelet count and hemoglobin was normal.  LFTs were normal. - She reports feeling different in the neck region.  I have examined the neck which did not show any palpable masses.  She also feels very tired all the time. - I have recommended decreasing Ibrance 100 mg 3 weeks on/1 week off.  I have sent in a prescription. -  She will complete current bottle and start taking the lower dose. - RTC 2 months for follow-up with repeat labs and tumor marker.  2.  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  Neuropathy is predominantly in the hands.  She is allergic to gabapentin. - If any worsening we will consider Lyrica.  3.  Bone metastatic disease: - Continue calcium and vitamin D supplements.  Calcium today is 9.5. - Continue denosumab today and monthly.  4.  Hot flashes: - Continue Effexor 37.5 mg daily.  Hot flashes well controlled.  5.  Dysphagia: - She reports occasionally food gets stuck behind the sternum for the past few months.  She did not have any weight loss.  We have made referral to GI.  She will see them in December.  6.  Body pains: - I have sent a refill for hydrocodone 5/325 1 tablet daily as needed.  She will continue Flexeril 10 mg as needed.   Orders placed this encounter:  No orders of the defined types were placed in this encounter.    Derek Jack, MD [place] [phone]   I, Thana Ates, am acting as a scribe for Dr.  Derek Jack.  I, Derek Jack MD, have reviewed the above documentation for accuracy and completeness, and I agree with the above.

## 2021-10-11 ENCOUNTER — Inpatient Hospital Stay (HOSPITAL_COMMUNITY): Payer: Medicaid Other | Attending: Hematology

## 2021-10-11 ENCOUNTER — Other Ambulatory Visit: Payer: Self-pay

## 2021-10-11 ENCOUNTER — Inpatient Hospital Stay (HOSPITAL_COMMUNITY): Payer: Medicaid Other

## 2021-10-11 ENCOUNTER — Inpatient Hospital Stay (HOSPITAL_COMMUNITY): Payer: Medicaid Other | Admitting: Hematology

## 2021-10-11 VITALS — BP 151/92 | HR 81 | Temp 97.0°F | Resp 18 | Wt 174.8 lb

## 2021-10-11 DIAGNOSIS — C50919 Malignant neoplasm of unspecified site of unspecified female breast: Secondary | ICD-10-CM | POA: Diagnosis not present

## 2021-10-11 DIAGNOSIS — Z79899 Other long term (current) drug therapy: Secondary | ICD-10-CM | POA: Diagnosis not present

## 2021-10-11 DIAGNOSIS — Z7982 Long term (current) use of aspirin: Secondary | ICD-10-CM | POA: Diagnosis not present

## 2021-10-11 DIAGNOSIS — Z170 Estrogen receptor positive status [ER+]: Secondary | ICD-10-CM | POA: Diagnosis not present

## 2021-10-11 DIAGNOSIS — R1310 Dysphagia, unspecified: Secondary | ICD-10-CM | POA: Insufficient documentation

## 2021-10-11 DIAGNOSIS — G629 Polyneuropathy, unspecified: Secondary | ICD-10-CM | POA: Diagnosis not present

## 2021-10-11 DIAGNOSIS — Z23 Encounter for immunization: Secondary | ICD-10-CM | POA: Insufficient documentation

## 2021-10-11 DIAGNOSIS — Z5111 Encounter for antineoplastic chemotherapy: Secondary | ICD-10-CM | POA: Insufficient documentation

## 2021-10-11 DIAGNOSIS — M8580 Other specified disorders of bone density and structure, unspecified site: Secondary | ICD-10-CM | POA: Diagnosis not present

## 2021-10-11 DIAGNOSIS — C7951 Secondary malignant neoplasm of bone: Secondary | ICD-10-CM | POA: Insufficient documentation

## 2021-10-11 DIAGNOSIS — I10 Essential (primary) hypertension: Secondary | ICD-10-CM | POA: Insufficient documentation

## 2021-10-11 DIAGNOSIS — R232 Flushing: Secondary | ICD-10-CM | POA: Insufficient documentation

## 2021-10-11 LAB — CBC WITH DIFFERENTIAL/PLATELET
Abs Immature Granulocytes: 0.01 10*3/uL (ref 0.00–0.07)
Basophils Absolute: 0.1 10*3/uL (ref 0.0–0.1)
Basophils Relative: 2 %
Eosinophils Absolute: 0 10*3/uL (ref 0.0–0.5)
Eosinophils Relative: 1 %
HCT: 41.1 % (ref 36.0–46.0)
Hemoglobin: 13.8 g/dL (ref 12.0–15.0)
Immature Granulocytes: 0 %
Lymphocytes Relative: 39 %
Lymphs Abs: 1.1 10*3/uL (ref 0.7–4.0)
MCH: 36.1 pg — ABNORMAL HIGH (ref 26.0–34.0)
MCHC: 33.6 g/dL (ref 30.0–36.0)
MCV: 107.6 fL — ABNORMAL HIGH (ref 80.0–100.0)
Monocytes Absolute: 0.4 10*3/uL (ref 0.1–1.0)
Monocytes Relative: 15 %
Neutro Abs: 1.2 10*3/uL — ABNORMAL LOW (ref 1.7–7.7)
Neutrophils Relative %: 43 %
Platelets: 201 10*3/uL (ref 150–400)
RBC: 3.82 MIL/uL — ABNORMAL LOW (ref 3.87–5.11)
RDW: 13.4 % (ref 11.5–15.5)
Smear Review: NORMAL
WBC: 2.8 10*3/uL — ABNORMAL LOW (ref 4.0–10.5)
nRBC: 0 % (ref 0.0–0.2)

## 2021-10-11 LAB — COMPREHENSIVE METABOLIC PANEL
ALT: 15 U/L (ref 0–44)
AST: 20 U/L (ref 15–41)
Albumin: 4.4 g/dL (ref 3.5–5.0)
Alkaline Phosphatase: 63 U/L (ref 38–126)
Anion gap: 8 (ref 5–15)
BUN: 14 mg/dL (ref 6–20)
CO2: 25 mmol/L (ref 22–32)
Calcium: 9.5 mg/dL (ref 8.9–10.3)
Chloride: 106 mmol/L (ref 98–111)
Creatinine, Ser: 0.8 mg/dL (ref 0.44–1.00)
GFR, Estimated: >60 mL/min (ref >60)
Glucose, Bld: 108 mg/dL — ABNORMAL HIGH (ref 70–99)
Potassium: 3.7 mmol/L (ref 3.5–5.1)
Sodium: 139 mmol/L (ref 135–145)
Total Bilirubin: 0.9 mg/dL (ref 0.3–1.2)
Total Protein: 7.7 g/dL (ref 6.5–8.1)

## 2021-10-11 MED ORDER — DENOSUMAB 120 MG/1.7ML ~~LOC~~ SOLN
120.0000 mg | Freq: Once | SUBCUTANEOUS | Status: AC
  Administered 2021-10-11: 120 mg via SUBCUTANEOUS
  Filled 2021-10-11: qty 1.7

## 2021-10-11 MED ORDER — FULVESTRANT 250 MG/5ML IM SOLN
500.0000 mg | Freq: Once | INTRAMUSCULAR | Status: AC
  Administered 2021-10-11: 500 mg via INTRAMUSCULAR
  Filled 2021-10-11: qty 10

## 2021-10-11 MED ORDER — BUTALBITAL-APAP-CAFFEINE 50-325-40 MG PO TABS: 1.0000 | ORAL_TABLET | ORAL | 5 refills | Status: DC | PRN

## 2021-10-11 MED ORDER — HYDROCODONE-ACETAMINOPHEN 5-325 MG PO TABS: 1.0000 | ORAL_TABLET | Freq: Every day | ORAL | 0 refills | Status: DC | PRN

## 2021-10-11 MED ORDER — PALBOCICLIB 100 MG PO TABS
125.0000 mg | ORAL_TABLET | Freq: Every day | ORAL | 3 refills | Status: DC
Start: 2021-10-11 — End: 2022-01-03

## 2021-10-11 MED ORDER — INFLUENZA VAC SPLIT QUAD 0.5 ML IM SUSY
0.5000 mL | PREFILLED_SYRINGE | Freq: Once | INTRAMUSCULAR | Status: AC
  Administered 2021-10-11: 0.5 mL via INTRAMUSCULAR
  Filled 2021-10-11: qty 0.5

## 2021-10-11 MED ORDER — CYCLOBENZAPRINE HCL 10 MG PO TABS: 10.0000 mg | ORAL_TABLET | ORAL | 5 refills | Status: DC | PRN

## 2021-10-11 NOTE — Patient Instructions (Signed)
[facility]  Discharge Instructions: Thank you for choosing [facility] to provide your oncology and hematology care.  If you have a lab appointment with the [facility], please come in thru the Main Entrance and check in at the main information desk.  Wear comfortable clothing and clothing appropriate for easy access to any Portacath or PICC line.   We strive to give you quality time with your provider. You may need to reschedule your appointment if you arrive late (15 or more minutes).  Arriving late affects you and other patients whose appointments are after yours.  Also, if you miss three or more appointments without notifying the office, you may be dismissed from the clinic at the provider's discretion.      For prescription refill requests, have your pharmacy contact our office and allow 72 hours for refills to be completed.    Today you received the following: Faslodex, Xgeva .       To help prevent nausea and vomiting after your treatment, we encourage you to take your nausea medication as directed.  BELOW ARE SYMPTOMS THAT SHOULD BE REPORTED IMMEDIATELY: *FEVER GREATER THAN 100.4 F (38 C) OR HIGHER *CHILLS OR SWEATING *NAUSEA AND VOMITING THAT IS NOT CONTROLLED WITH YOUR NAUSEA MEDICATION *UNUSUAL SHORTNESS OF BREATH *UNUSUAL BRUISING OR BLEEDING *URINARY PROBLEMS (pain or burning when urinating, or frequent urination) *BOWEL PROBLEMS (unusual diarrhea, constipation, pain near the anus) TENDERNESS IN MOUTH AND THROAT WITH OR WITHOUT PRESENCE OF ULCERS (sore throat, sores in mouth, or a toothache) UNUSUAL RASH, SWELLING OR PAIN  UNUSUAL VAGINAL DISCHARGE OR ITCHING   Items with * indicate a potential emergency and should be followed up as soon as possible or go to the Emergency Department if any problems should occur.  Please show the CHEMOTHERAPY ALERT CARD or IMMUNOTHERAPY ALERT CARD at check-in to the Emergency Department and triage  nurse.  Should you have questions after your visit or need to cancel or reschedule your appointment, please contact [facility] [phone]  and follow the prompts.  Office hours are 8:00 a.m. to 4:30 p.m. Monday - Friday. Please note that voicemails left after 4:00 p.m. may not be returned until the following business day.  We are closed weekends and major holidays. You have access to a nurse at all times for urgent questions. Please call the main number to the clinic [phone] and follow the prompts.  For any non-urgent questions, you may also contact your provider using MyChart. We now offer e-Visits for anyone 52 and older to request care online for non-urgent symptoms. For details visit [email or web address].   Also download the MyChart app! Go to the app store, search "MyChart", open the app, select [facility], and log in with your MyChart username and password.  Due to Covid, a mask is required upon entering the hospital/clinic. If you do not have a mask, one will be given to you upon arrival. For doctor visits, patients may have 1 support person aged 42 or older with them. For treatment visits, patients cannot have anyone with them due to current Covid guidelines and our immunocompromised population.

## 2021-10-11 NOTE — Progress Notes (Signed)
Patient is taking Ibrance as prescribed.  She has not missed any doses and reports no side effects at this time.   

## 2021-10-11 NOTE — Progress Notes (Signed)
Faslodex and Xgeva injections given today per MD orders. Tolerated without adverse affects. Vital signs stable. No complaints at this time. Discharged from clinic ambulatory in stable condition. Alert and oriented x 3. F/U with [facility] as scheduled.

## 2021-10-11 NOTE — Progress Notes (Signed)
Patient in for office visit. Flu vaccine ordered. See MAR for administration information. Patient stable throughout injection. Patient discharged ambulatory and in stable condition.

## 2021-10-12 ENCOUNTER — Encounter (HOSPITAL_COMMUNITY): Payer: Self-pay | Admitting: Hematology and Oncology

## 2021-10-12 ENCOUNTER — Other Ambulatory Visit (HOSPITAL_COMMUNITY): Payer: Self-pay | Admitting: *Deleted

## 2021-10-12 LAB — CANCER ANTIGEN 15-3: CA 15-3: 31.6 U/mL — ABNORMAL HIGH (ref 0.0–25.0)

## 2021-10-12 LAB — CANCER ANTIGEN 27.29: CA 27.29: 37.3 U/mL (ref 0.0–38.6)

## 2021-10-12 MED ORDER — HYDROCODONE-ACETAMINOPHEN 5-325 MG PO TABS: 1.0000 | ORAL_TABLET | Freq: Every day | ORAL | 0 refills | Status: DC | PRN

## 2021-10-16 ENCOUNTER — Other Ambulatory Visit (HOSPITAL_COMMUNITY): Payer: Self-pay | Admitting: *Deleted

## 2021-10-16 MED ORDER — CYCLOBENZAPRINE HCL 10 MG PO TABS: 10.0000 mg | ORAL_TABLET | Freq: Two times a day (BID) | ORAL | 5 refills | Status: DC | PRN

## 2021-10-20 DIAGNOSIS — I972 Postmastectomy lymphedema syndrome: Secondary | ICD-10-CM | POA: Diagnosis not present

## 2021-11-08 ENCOUNTER — Inpatient Hospital Stay (HOSPITAL_COMMUNITY): Payer: Medicaid Other

## 2021-11-08 ENCOUNTER — Inpatient Hospital Stay (HOSPITAL_COMMUNITY): Payer: Medicaid Other | Attending: Hematology

## 2021-11-08 ENCOUNTER — Encounter (HOSPITAL_COMMUNITY): Payer: Self-pay

## 2021-11-08 ENCOUNTER — Other Ambulatory Visit: Payer: Self-pay

## 2021-11-08 VITALS — BP 153/82 | HR 81 | Temp 97.6°F | Resp 20

## 2021-11-08 DIAGNOSIS — C50919 Malignant neoplasm of unspecified site of unspecified female breast: Secondary | ICD-10-CM

## 2021-11-08 DIAGNOSIS — C7951 Secondary malignant neoplasm of bone: Secondary | ICD-10-CM | POA: Diagnosis not present

## 2021-11-08 DIAGNOSIS — C50912 Malignant neoplasm of unspecified site of left female breast: Secondary | ICD-10-CM | POA: Diagnosis not present

## 2021-11-08 DIAGNOSIS — Z5111 Encounter for antineoplastic chemotherapy: Secondary | ICD-10-CM | POA: Insufficient documentation

## 2021-11-08 LAB — CBC WITH DIFFERENTIAL/PLATELET
Basophils Absolute: 0.1 10*3/uL (ref 0.0–0.1)
Basophils Relative: 4 %
Eosinophils Absolute: 0.1 10*3/uL (ref 0.0–0.5)
Eosinophils Relative: 2 %
HCT: 37.5 % (ref 36.0–46.0)
Hemoglobin: 13.2 g/dL (ref 12.0–15.0)
Lymphocytes Relative: 41 %
Lymphs Abs: 1.3 10*3/uL (ref 0.7–4.0)
MCH: 36.4 pg — ABNORMAL HIGH (ref 26.0–34.0)
MCHC: 35.2 g/dL (ref 30.0–36.0)
MCV: 103.3 fL — ABNORMAL HIGH (ref 80.0–100.0)
Monocytes Absolute: 0.3 10*3/uL (ref 0.1–1.0)
Monocytes Relative: 8 %
Myelocytes: 1 %
Neutro Abs: 1.4 10*3/uL — ABNORMAL LOW (ref 1.7–7.7)
Neutrophils Relative %: 44 %
Platelets: 225 10*3/uL (ref 150–400)
RBC: 3.63 MIL/uL — ABNORMAL LOW (ref 3.87–5.11)
RDW: 13.1 % (ref 11.5–15.5)
WBC: 3.2 10*3/uL — ABNORMAL LOW (ref 4.0–10.5)
nRBC: 0 % (ref 0.0–0.2)
nRBC: 1 /100 WBC — ABNORMAL HIGH

## 2021-11-08 LAB — COMPREHENSIVE METABOLIC PANEL
ALT: 18 U/L (ref 0–44)
AST: 19 U/L (ref 15–41)
Albumin: 4.2 g/dL (ref 3.5–5.0)
Alkaline Phosphatase: 61 U/L (ref 38–126)
Anion gap: 11 (ref 5–15)
BUN: 11 mg/dL (ref 6–20)
CO2: 23 mmol/L (ref 22–32)
Calcium: 9.3 mg/dL (ref 8.9–10.3)
Chloride: 105 mmol/L (ref 98–111)
Creatinine, Ser: 0.74 mg/dL (ref 0.44–1.00)
GFR, Estimated: >60 mL/min (ref >60)
Glucose, Bld: 81 mg/dL (ref 70–99)
Potassium: 3.7 mmol/L (ref 3.5–5.1)
Sodium: 139 mmol/L (ref 135–145)
Total Bilirubin: 0.5 mg/dL (ref 0.3–1.2)
Total Protein: 7.3 g/dL (ref 6.5–8.1)

## 2021-11-08 MED ORDER — FULVESTRANT 250 MG/5ML IM SOSY
500.0000 mg | PREFILLED_SYRINGE | Freq: Once | INTRAMUSCULAR | Status: AC
  Administered 2021-11-08: 500 mg via INTRAMUSCULAR
  Filled 2021-11-08: qty 10

## 2021-11-08 MED ORDER — DENOSUMAB 120 MG/1.7ML ~~LOC~~ SOLN
120.0000 mg | Freq: Once | SUBCUTANEOUS | Status: AC
  Administered 2021-11-08: 120 mg via SUBCUTANEOUS
  Filled 2021-11-08: qty 1.7

## 2021-11-08 NOTE — Progress Notes (Signed)
Patient tolerated Faslodex injection with no complaints voiced. Bilateral sites clean and dry with no bruising or swelling noted. Band aids applied. See MAR for details. Patient stable during and after injections.  Patient taking calcium as directed. Denied tooth, jaw, and leg pain. No recent or upcoming dental visits. Labs reviewed. Patient tolerated injection with no complaints voiced. See MAR for details. Patient stable during and after injection. Site clean and dry with no bruising or swelling noted. Band aid applied. Vss with discharge and left in satisfactory condition with no s/s of distress.  

## 2021-11-08 NOTE — Patient Instructions (Signed)
[facility]  Discharge Instructions: Thank you for choosing [facility] to provide your oncology and hematology care.  If you have a lab appointment with the [facility], please come in thru the Main Entrance and check in at the main information desk.  Wear comfortable clothing and clothing appropriate for easy access to any Portacath or PICC line.   We strive to give you quality time with your provider. You may need to reschedule your appointment if you arrive late (15 or more minutes).  Arriving late affects you and other patients whose appointments are after yours.  Also, if you miss three or more appointments without notifying the office, you may be dismissed from the clinic at the provider's discretion.      For prescription refill requests, have your pharmacy contact our office and allow 72 hours for refills to be completed.    Today you received the following chemotherapy and/or immunotherapy agents Faslodex and Xgeva, return as scheduled.   To help prevent nausea and vomiting after your treatment, we encourage you to take your nausea medication as directed.  BELOW ARE SYMPTOMS THAT SHOULD BE REPORTED IMMEDIATELY: *FEVER GREATER THAN 100.4 F (38 C) OR HIGHER *CHILLS OR SWEATING *NAUSEA AND VOMITING THAT IS NOT CONTROLLED WITH YOUR NAUSEA MEDICATION *UNUSUAL SHORTNESS OF BREATH *UNUSUAL BRUISING OR BLEEDING *URINARY PROBLEMS (pain or burning when urinating, or frequent urination) *BOWEL PROBLEMS (unusual diarrhea, constipation, pain near the anus) TENDERNESS IN MOUTH AND THROAT WITH OR WITHOUT PRESENCE OF ULCERS (sore throat, sores in mouth, or a toothache) UNUSUAL RASH, SWELLING OR PAIN  UNUSUAL VAGINAL DISCHARGE OR ITCHING   Items with * indicate a potential emergency and should be followed up as soon as possible or go to the Emergency Department if any problems should occur.  Please show the CHEMOTHERAPY ALERT CARD or IMMUNOTHERAPY ALERT CARD at  check-in to the Emergency Department and triage nurse.  Should you have questions after your visit or need to cancel or reschedule your appointment, please contact [facility] [phone]  and follow the prompts.  Office hours are 8:00 a.m. to 4:30 p.m. Monday - Friday. Please note that voicemails left after 4:00 p.m. may not be returned until the following business day.  We are closed weekends and major holidays. You have access to a nurse at all times for urgent questions. Please call the main number to the clinic [phone] and follow the prompts.  For any non-urgent questions, you may also contact your provider using MyChart. We now offer e-Visits for anyone 49 and older to request care online for non-urgent symptoms. For details visit [email or web address].   Also download the MyChart app! Go to the app store, search "MyChart", open the app, select [facility], and log in with your MyChart username and password.  Due to Covid, a mask is required upon entering the hospital/clinic. If you do not have a mask, one will be given to you upon arrival. For doctor visits, patients may have 1 support person aged 38 or older with them. For treatment visits, patients cannot have anyone with them due to current Covid guidelines and our immunocompromised population.

## 2021-11-20 DIAGNOSIS — I972 Postmastectomy lymphedema syndrome: Secondary | ICD-10-CM | POA: Diagnosis not present

## 2021-12-04 ENCOUNTER — Inpatient Hospital Stay (HOSPITAL_COMMUNITY): Payer: Medicaid Other | Attending: Hematology

## 2021-12-04 ENCOUNTER — Other Ambulatory Visit: Payer: Self-pay

## 2021-12-04 DIAGNOSIS — C50912 Malignant neoplasm of unspecified site of left female breast: Secondary | ICD-10-CM | POA: Diagnosis not present

## 2021-12-04 DIAGNOSIS — C773 Secondary and unspecified malignant neoplasm of axilla and upper limb lymph nodes: Secondary | ICD-10-CM | POA: Insufficient documentation

## 2021-12-04 DIAGNOSIS — Z170 Estrogen receptor positive status [ER+]: Secondary | ICD-10-CM | POA: Diagnosis not present

## 2021-12-04 DIAGNOSIS — C50919 Malignant neoplasm of unspecified site of unspecified female breast: Secondary | ICD-10-CM

## 2021-12-04 DIAGNOSIS — Z5111 Encounter for antineoplastic chemotherapy: Secondary | ICD-10-CM | POA: Diagnosis not present

## 2021-12-04 DIAGNOSIS — C7951 Secondary malignant neoplasm of bone: Secondary | ICD-10-CM | POA: Insufficient documentation

## 2021-12-04 LAB — CBC WITH DIFFERENTIAL/PLATELET
Abs Immature Granulocytes: 0 10*3/uL (ref 0.00–0.07)
Basophils Absolute: 0 10*3/uL (ref 0.0–0.1)
Basophils Relative: 2 %
Eosinophils Absolute: 0 10*3/uL (ref 0.0–0.5)
Eosinophils Relative: 0 %
HCT: 37.1 % (ref 36.0–46.0)
Hemoglobin: 13 g/dL (ref 12.0–15.0)
Immature Granulocytes: 0 %
Lymphocytes Relative: 40 %
Lymphs Abs: 1 10*3/uL (ref 0.7–4.0)
MCH: 36.9 pg — ABNORMAL HIGH (ref 26.0–34.0)
MCHC: 35 g/dL (ref 30.0–36.0)
MCV: 105.4 fL — ABNORMAL HIGH (ref 80.0–100.0)
Monocytes Absolute: 0.2 10*3/uL (ref 0.1–1.0)
Monocytes Relative: 10 %
Neutro Abs: 1.2 10*3/uL — ABNORMAL LOW (ref 1.7–7.7)
Neutrophils Relative %: 48 %
Platelets: 230 10*3/uL (ref 150–400)
RBC: 3.52 MIL/uL — ABNORMAL LOW (ref 3.87–5.11)
RDW: 13.2 % (ref 11.5–15.5)
WBC: 2.4 10*3/uL — ABNORMAL LOW (ref 4.0–10.5)
nRBC: 0 % (ref 0.0–0.2)

## 2021-12-04 LAB — COMPREHENSIVE METABOLIC PANEL
ALT: 20 U/L (ref 0–44)
AST: 22 U/L (ref 15–41)
Albumin: 4.2 g/dL (ref 3.5–5.0)
Alkaline Phosphatase: 58 U/L (ref 38–126)
Anion gap: 11 (ref 5–15)
BUN: 15 mg/dL (ref 6–20)
CO2: 22 mmol/L (ref 22–32)
Calcium: 9.5 mg/dL (ref 8.9–10.3)
Chloride: 108 mmol/L (ref 98–111)
Creatinine, Ser: 0.73 mg/dL (ref 0.44–1.00)
GFR, Estimated: >60 mL/min (ref >60)
Glucose, Bld: 116 mg/dL — ABNORMAL HIGH (ref 70–99)
Potassium: 3.7 mmol/L (ref 3.5–5.1)
Sodium: 141 mmol/L (ref 135–145)
Total Bilirubin: 0.7 mg/dL (ref 0.3–1.2)
Total Protein: 7.3 g/dL (ref 6.5–8.1)

## 2021-12-04 LAB — VITAMIN B12: Vitamin B-12: 935 pg/mL — ABNORMAL HIGH (ref 180–914)

## 2021-12-05 LAB — CANCER ANTIGEN 27.29: CA 27.29: 29.8 U/mL (ref 0.0–38.6)

## 2021-12-05 LAB — CANCER ANTIGEN 15-3: CA 15-3: 29 U/mL — ABNORMAL HIGH (ref 0.0–25.0)

## 2021-12-05 NOTE — Progress Notes (Signed)
[name], [place] [id number]   CLINIC:  Medical Oncology/Hematology  PCP:  Pcp, No None None   REASON FOR VISIT:  Follow-up for metastatic breast cancer  PRIOR THERAPY: none  NGS Results: not done  CURRENT THERAPY: Palbociclib + Fulvestrant every 4 weeks  BRIEF ONCOLOGIC HISTORY:  Oncology History  Metastatic breast cancer ([place])  07/06/2021 Initial Diagnosis   Metastatic breast cancer ([place])   07/18/2021 -  Chemotherapy   Patient is on Treatment Plan : BREAST Palbociclib + Fulvestrant q28d       CANCER STAGING:  Cancer Staging  No matching staging information was found for the patient.  INTERVAL HISTORY:  [name], a 60 y.o. female, returns for routine follow-up and consideration for next cycle of chemotherapy. [name] was last seen on 10/01/2021.  Due for cycle #6 of Palbociclib + Fulvestrant today.   Overall, she tells me she has been feeling pretty well. She reports constant tingling/numbness and stinging in her left arm and hand, and intermittently in her legs starting last month. Her fatigue has improved. She reports a tender spot on her right hip, but she denies any associated lumps. She reports hot flashes throughout the day and night.  Overall, she feels ready for next cycle of chemo today.   REVIEW OF SYSTEMS:  Review of Systems  Constitutional:  Negative for appetite change (75%) and fatigue (60%).  HENT:   Positive for trouble swallowing. Negative for lump/mass.   Respiratory:  Positive for cough and shortness of breath.   Gastrointestinal:  Positive for constipation.  Musculoskeletal:  Positive for arthralgias (5/10 shoulders), back pain (5/10 lower) and neck pain (5/10).  Neurological:  Positive for headaches and numbness (L arm and hand; legs bilaterally).  Psychiatric/Behavioral:  Positive for sleep disturbance.   All other systems reviewed and are negative.  PAST MEDICAL/SURGICAL HISTORY:  Past Medical  History:  Diagnosis Date   Allergy    Bone metastasis (HCC)    Breast cancer (H[facility])    GERD (gastroesophageal reflux disease)    Hypertension    Lymphedema of arm    left arm   Osteopenia    Past Surgical History:  Procedure Laterality Date   CARPAL TUNNEL RELEASE Bilateral 2006   hysteroscopy     mastectomy Bilateral 10/22/2019   pt had necrosis after 1st surgery, had another surgery to remove implants   TUBAL LIGATION Bilateral     SOCIAL HISTORY:  Social History   Socioeconomic History   Marital status: Married    Spouse name: Not on file   Number of children: 2   Years of education: Not on file   Highest education level: Not on file  Occupational History   Occupation: Disability  Tobacco Use   Smoking status: Every Day    Packs/day: 0.50    Years: 40.00    Pack years: 20.00    Types: Cigarettes   Smokeless tobacco: Never  Vaping Use   Vaping Use: Never used  Substance and Sexual Activity   Alcohol use: Never   Drug use: Never   Sexual activity: Not on file  Other Topics Concern   Not on file  Social History Narrative   Not on file   Social Determinants of Health   Financial Resource Strain: Low Risk    Difficulty of Paying Living Expenses: Not hard at all  Food Insecurity: No Food Insecurity   Worried About Estate manager/land agent of Food in the Last Year:  Never true   Ran Out of Food in the Last Year: Never true  Transportation Needs: No Transportation Needs   Lack of Transportation (Medical): No   Lack of Transportation (Non-Medical): No  Physical Activity: Insufficiently Active   Days of Exercise per Week: 5 days   Minutes of Exercise per Session: 10 min  Stress: No Stress Concern Present   Feeling of Stress : Only a little  Social Connections: Moderately Isolated   Frequency of Communication with Friends and Family: Three times a week   Frequency of Social Gatherings with Friends and Family: Once a week   Attends Religious Services: Never   Museum/gallery conservator or Organizations: No   Attends Music therapist: Never   Marital Status: Married  Human resources officer Violence: Not At Risk   Fear of Current or Ex-Partner: No   Emotionally Abused: No   Physically Abused: No   Sexually Abused: No    FAMILY HISTORY:  No family history on file.  CURRENT MEDICATIONS:  Current Outpatient Medications  Medication Sig Dispense Refill   amLODipine (NORVASC) 5 MG tablet Take 5 mg by mouth daily.     AMLODIPINE BENZOATE PO Take 5 mg by mouth daily.     Ascorbic Acid (VITAMIN C) 1000 MG tablet Take 1,000 mg by mouth daily.     aspirin 81 MG chewable tablet Chew by mouth.     aspirin EC 81 MG tablet Take 81 mg by mouth daily. Swallow whole.     Calcium-Magnesium-Vitamin D (CALCIUM 1200+D3 PO) Take 1 tablet by mouth daily.     cetirizine (ZYRTEC) 10 MG tablet Take 10 mg by mouth daily.     Cinnamon 500 MG TABS Take 2 tablets by mouth daily.     Cinnamon Bark POWD Take by mouth.     cyanocobalamin 1000 MCG tablet Take 1 tablet (1,000 mcg total) by mouth daily. 30 tablet 6   denosumab (XGEVA) 120 MG/1.7ML SOLN injection Inject 120 mg into the skin every 30 (thirty) days.     fulvestrant (FASLODEX) 250 MG/5ML injection Inject 500 mg into the muscle every 30 (thirty) days. One injection each buttock over 1-2 minutes. Warm prior to use.     furosemide (LASIX) 20 MG tablet Take 20 mg by mouth as needed.     HYDROcodone-acetaminophen (NORCO/VICODIN) 5-325 MG tablet Take 1 tablet by mouth daily as needed for moderate pain. 30 tablet 0   lansoprazole (PREVACID SOLUTAB) 30 MG disintegrating tablet 30 mg before breakfast.     lansoprazole (PREVACID) 15 MG capsule Take 15 mg by mouth daily at 12 noon. Pt takes 1-2 tablets daily     naphazoline-pheniramine (NAPHCON-A) 0.025-0.3 % ophthalmic solution Inject into the eye.     NAPHCON-A 0.025-0.3 % ophthalmic solution 2 drops 3 (three) times daily.     palbociclib (IBRANCE) 100 MG tablet Take 1 tablet (100  mg total) by mouth daily. Take for 21 days on, 7 days off, repeat every 28 days. 21 tablet 3   PROAIR HFA 108 (90 Base) MCG/ACT inhaler SMARTSIG:2 Puff(s) By Mouth Every 4 Hours PRN     triamcinolone cream (KENALOG) 0.1 %      butalbital-acetaminophen-caffeine (FIORICET) 50-325-40 MG tablet Take 1 tablet by mouth as needed for headache. (Patient not taking: Reported on 12/06/2021) 30 tablet 5   cyclobenzaprine (FLEXERIL) 10 MG tablet Take 1 tablet (10 mg total) by mouth 2 (two) times daily as needed for muscle spasms. (Patient not taking: Reported on  12/06/2021) 60 tablet 5   No current facility-administered medications for this visit.    ALLERGIES:  Allergies  Allergen Reactions   Augmentin [Amoxicillin-Pot Clavulanate] Anaphylaxis   Ciprofloxacin Anaphylaxis   Claritin [Loratadine] Anaphylaxis   Gabapentin Anaphylaxis   Keflex [Cephalexin] Anaphylaxis   Levofloxacin Anaphylaxis   Lisinopril Anaphylaxis   Metoprolol Anaphylaxis   Nexium [Esomeprazole] Anaphylaxis   Tetracyclines & Related Anaphylaxis   Tetracycline Hcl     Tongue swelling     Chromium Other (See Comments)    headache   Oxycodone Itching   Sulfa Antibiotics    Latex Rash    itching   Nickel Rash    itching    PHYSICAL EXAM:  Performance status (ECOG): 0 - Asymptomatic  Vitals:   12/06/21 1117  BP: 134/78  Pulse: 77  Resp: 18  Temp: 97.8 F (36.6 C)  SpO2: 100%   Wt Readings from Last 3 Encounters:  12/06/21 177 lb 12.8 oz (80.6 kg)  10/11/21 174 lb 12.8 oz (79.3 kg)  09/13/21 177 lb 14.6 oz (80.7 kg)   Physical Exam Vitals reviewed.  Constitutional:      Appearance: Normal appearance.  Cardiovascular:     Rate and Rhythm: Normal rate and regular rhythm.     Pulses: Normal pulses.     Heart sounds: Normal heart sounds.  Pulmonary:     Effort: Pulmonary effort is normal.     Breath sounds: Normal breath sounds.  Neurological:     General: No focal deficit present.     Mental Status: She is  alert and oriented to person, place, and time.  Psychiatric:        Mood and Affect: Mood normal.        Behavior: Behavior normal.    LABORATORY DATA:  I have reviewed the labs as listed.  CBC Latest Ref Rng & Units 12/04/2021 11/08/2021 10/11/2021  WBC 4.0 - 10.5 K/uL 2.4(L) 3.2(L) 2.8(L)  Hemoglobin 12.0 - 15.0 g/dL 13.0 13.2 13.8  Hematocrit 36.0 - 46.0 % 37.1 37.5 41.1  Platelets 150 - 400 K/uL 230 225 201   CMP Latest Ref Rng & Units 12/04/2021 11/08/2021 10/11/2021  Glucose 70 - 99 mg/dL 116(H) 81 108(H)  BUN 6 - 20 mg/dL '15 11 14  ' Creatinine 0.44 - 1.00 mg/dL 0.73 0.74 0.80  Sodium 135 - 145 mmol/L 141 139 139  Potassium 3.5 - 5.1 mmol/L 3.7 3.7 3.7  Chloride 98 - 111 mmol/L 108 105 106  CO2 22 - 32 mmol/L '22 23 25  ' Calcium 8.9 - 10.3 mg/dL 9.5 9.3 9.5  Total Protein 6.5 - 8.1 g/dL 7.3 7.3 7.7  Total Bilirubin 0.3 - 1.2 mg/dL 0.7 0.5 0.9  Alkaline Phos 38 - 126 U/L 58 61 63  AST 15 - 41 U/L '22 19 20  ' ALT 0 - 44 U/L '20 18 15    ' DIAGNOSTIC IMAGING:  I have independently reviewed the scans and discussed with the patient. No results found.   ASSESSMENT:  1.  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 - Started on Ibrance and Faslodex in December 2021 for metastatic disease. - PET scan on 08/10/2021 with no evidence of hypermetabolic metastatic disease.  Widespread scattered sclerotic bone metastasis in the spine and pelvis are stable and not metabolically  active.  No lymphadenopathy.  Asymmetric muscular uptake in the left lower scalene muscles and in the intercostal muscles of the ventral upper left chest wall without discrete mass correlate on the CT images, favoring activity related uptake.  2.  Social/family history: - She is a retired Building control surveyor.  She  recently moved back from [place]. - Younger sister had breast cancer in her 27s.  Mother died of leukemia at age 64.  Paternal cousin also had breast cancer.  3.  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  She underwent immediate implants which were later removed on 12/10/2019 secondary to infection and necrosis. - XRT to the chest wall and regional lymph nodes, 60 [place], from 02/29/2020 through 04/12/2020 - She was treated with adjuvant anastrozole which was started 12/09/2019 and was found to have metastatic disease in November 2021. - Previous genetic testing was reportedly negative and [place].   PLAN:  1.  Metastatic breast cancer to the bones, ER/PR positive, HER2 negative: - At last visit I cut back Ibrance 1-she reports improvement in fatigue since Ibrance dose reduced. - I have reviewed her labs today which showed normal LFTs.  B12 is 935.  She may change B12 to every other day.  White count is slightly low at 2.4 with ANC of 1.2, Ibrance effect. - Tumor markers have shown improvement from October.  CA 15-3 was 29, down from 31.  CA 27-29 is normal. - Continue Faslodex injection today. - RTC 2 months with labs 1 day prior.   2.  Peripheral neuropathy: - 1 month ago she started having tingling and numbness on and off.  Left hand and left arm have stinging sensation. - We will consider Lyrica if pain gets worse.  3.  Bone metastatic disease: - Calcium today is 9.5.  Continue calcium and vitamin D supplements.  Continue monthly denosumab.  4.  Hot flashes: - She has hot flashes.  She is  not taking Effexor.  5.  Dysphagia: - She reports food getting stuck behind sternum.  Follow-up with GI.  6.  Body pains: - Continue hydrocodone 5/325  daily as needed and Flexeril as needed.    Orders placed this encounter:  No orders of the defined types were placed in this encounter.    Derek Jack, MD [place] [phone]   I, Thana Ates, am acting as a scribe for Dr. Derek Jack.  I, Derek Jack MD, have reviewed the above documentation for accuracy and completeness, and I agree with the above.

## 2021-12-06 ENCOUNTER — Inpatient Hospital Stay (HOSPITAL_COMMUNITY): Payer: Medicaid Other

## 2021-12-06 ENCOUNTER — Inpatient Hospital Stay (HOSPITAL_COMMUNITY): Payer: Medicaid Other | Admitting: Hematology

## 2021-12-06 ENCOUNTER — Other Ambulatory Visit: Payer: Self-pay

## 2021-12-06 VITALS — BP 134/78 | HR 77 | Temp 97.8°F | Resp 18 | Ht 64.0 in | Wt 177.8 lb

## 2021-12-06 DIAGNOSIS — E538 Deficiency of other specified B group vitamins: Secondary | ICD-10-CM

## 2021-12-06 DIAGNOSIS — C773 Secondary and unspecified malignant neoplasm of axilla and upper limb lymph nodes: Secondary | ICD-10-CM | POA: Diagnosis not present

## 2021-12-06 DIAGNOSIS — C50912 Malignant neoplasm of unspecified site of left female breast: Secondary | ICD-10-CM | POA: Diagnosis not present

## 2021-12-06 DIAGNOSIS — C50919 Malignant neoplasm of unspecified site of unspecified female breast: Secondary | ICD-10-CM | POA: Diagnosis not present

## 2021-12-06 DIAGNOSIS — Z5111 Encounter for antineoplastic chemotherapy: Secondary | ICD-10-CM | POA: Diagnosis not present

## 2021-12-06 DIAGNOSIS — Z170 Estrogen receptor positive status [ER+]: Secondary | ICD-10-CM | POA: Diagnosis not present

## 2021-12-06 DIAGNOSIS — C7951 Secondary malignant neoplasm of bone: Secondary | ICD-10-CM | POA: Diagnosis not present

## 2021-12-06 MED ORDER — FULVESTRANT 250 MG/5ML IM SOSY
500.0000 mg | PREFILLED_SYRINGE | Freq: Once | INTRAMUSCULAR | Status: AC
  Administered 2021-12-06: 500 mg via INTRAMUSCULAR
  Filled 2021-12-06: qty 10

## 2021-12-06 MED ORDER — DENOSUMAB 120 MG/1.7ML ~~LOC~~ SOLN
120.0000 mg | Freq: Once | SUBCUTANEOUS | Status: AC
  Administered 2021-12-06: 120 mg via SUBCUTANEOUS
  Filled 2021-12-06: qty 1.7

## 2021-12-06 NOTE — Patient Instructions (Signed)
[place] at [facility] Discharge Instructions   You were seen and examined today by Dr. Delton Coombes. He reviewed your lab results which are normal/stable.  You will receive Faslodex and Xgeva injections today. Continue Ibrance as prescribed. You can decrease B12 pills to every other day.  Return as scheduled in 2 months for lab work and office visit.    Thank you for choosing [place] at [facility] to provide your oncology and hematology care.  To afford each patient quality time with our provider, please arrive at least 15 minutes before your scheduled appointment time.   If you have a lab appointment with the [facility] please come in thru the Main Entrance and check in at the main information desk.  You need to re-schedule your appointment should you arrive 10 or more minutes late.  We strive to give you quality time with our providers, and arriving late affects you and other patients whose appointments are after yours.  Also, if you no show three or more times for appointments you may be dismissed from the clinic at the providers discretion.     Again, thank you for choosing [facility].  Our hope is that these requests will decrease the amount of time that you wait before being seen by our physicians.       _____________________________________________________________  Should you have questions after your visit to [facility], please contact our office at [phone] and follow the prompts.  Our office hours are 8:00 a.m. and 4:30 p.m. Monday - Friday.  Please note that voicemails left after 4:00 p.m. may not be returned until the following business day.  We are closed weekends and major holidays.  You do have access to a nurse 24-7, just call the main number to the clinic [phone] and do not press any options, hold on the line and a nurse will answer the phone.    For prescription refill requests, have  your pharmacy contact our office and allow 72 hours.    Due to Covid, you will need to wear a mask upon entering the hospital. If you do not have a mask, a mask will be given to you at the Main Entrance upon arrival. For doctor visits, patients may have 1 support person age 66 or older with them. For treatment visits, patients can not have anyone with them due to social distancing guidelines and our immunocompromised population.

## 2021-12-20 DIAGNOSIS — I972 Postmastectomy lymphedema syndrome: Secondary | ICD-10-CM | POA: Diagnosis not present

## 2021-12-24 ENCOUNTER — Encounter (HOSPITAL_COMMUNITY): Payer: Self-pay | Admitting: Hematology and Oncology

## 2021-12-27 ENCOUNTER — Encounter (INDEPENDENT_AMBULATORY_CARE_PROVIDER_SITE_OTHER): Payer: Self-pay

## 2021-12-27 ENCOUNTER — Encounter (INDEPENDENT_AMBULATORY_CARE_PROVIDER_SITE_OTHER): Payer: Self-pay | Admitting: Gastroenterology

## 2021-12-27 ENCOUNTER — Other Ambulatory Visit: Payer: Self-pay

## 2021-12-27 ENCOUNTER — Ambulatory Visit (INDEPENDENT_AMBULATORY_CARE_PROVIDER_SITE_OTHER): Payer: Medicaid Other | Admitting: Gastroenterology

## 2021-12-27 ENCOUNTER — Other Ambulatory Visit (INDEPENDENT_AMBULATORY_CARE_PROVIDER_SITE_OTHER): Payer: Self-pay

## 2021-12-27 DIAGNOSIS — K219 Gastro-esophageal reflux disease without esophagitis: Secondary | ICD-10-CM

## 2021-12-27 DIAGNOSIS — R1310 Dysphagia, unspecified: Secondary | ICD-10-CM | POA: Insufficient documentation

## 2021-12-27 MED ORDER — LANSOPRAZOLE 30 MG PO CPDR: 30.0000 mg | DELAYED_RELEASE_CAPSULE | Freq: Two times a day (BID) | ORAL | 3 refills | Status: DC

## 2021-12-27 NOTE — Progress Notes (Signed)
[name], M.D. Gastroenterology & Hepatology [facility] [address] [place], [place] [address] Primary Care Physician: Pcp, No No address on file  Referring MD: Derek Jack, MD  Chief Complaint: Dysphagia  History of Present Illness: Wendy Zamora is a 60 y.o. female with metastatic breast cancer s/p bilateral mastectomy, radiation therapy and currently on chemotherapy on low dose Ibrance, Faslodex, Xgeva) , who presents for evaluation of dysphagia.  Patient reports that 6 weeks after starting Ibrance (11/2020), she had a new episode of sudden onset of "feeling her throat was closing" and she has had recurrent intermittent episodes of dysphagia with both solid and liquid food. She reported the dysphagia does not happen every time but has been less severe recently.She reports having heartburn frequently as well as regurgitation. She has symptoms every day. No odynophagia. She takes the OTC lanzoprazole daily, but most recently she is taking it twice a day. She states that it does not completely control the symptoms.Has also noticed some tenderness in the mid sternal area recently.  The patient denies having any nausea, vomiting, fever, chills, hematochezia, melena, hematemesis, abdominal distention, abdominal pain, diarrhea, jaundice, pruritus or weight loss.  Patient reports she had MBS while she lived in the [place]. She reports that she was told she had decreased muscle contraction of roughly 30%.  Unfortunately, no reports are available.  Last [id number] Last Colonoscopy:2021  - reports she had 4 polyps removed, no reports are available - performed at [facility]. Asked to repeat in 5 years.  FHx: neg for any gastrointestinal/liver disease,mother leukemia 6 years, sister breast cancer Social: smokes 1/2 pack a day, neg alcohol or illicit drug use Surgical: no abdominal surgeries  Past Medical History: Past  Medical History:  Diagnosis Date   Allergy    Asthma    diagnosed age 42   Bone metastasis ([place]) 08/2020   was on prolia   Breast cancer ([place])    started chemo 05/05/2019. finished 08/2019   Gangrene ([place])    GERD (gastroesophageal reflux disease)    Hypertension    started bp meds 2008   Lymphedema of arm    left arm   Osteopenia    Shingles 04/20/2020   left side    Past Surgical History: Past Surgical History:  Procedure Laterality Date   CARPAL TUNNEL RELEASE Bilateral 2006   hysteroscopy     age 79   mastectomy Bilateral 10/22/2019   pt had necrosis after 1st surgery, had another surgery to remove implants 12/10/2019   TUBAL LIGATION Bilateral 1998   WISDOM TOOTH EXTRACTION     early 51's    Family History: Family History  Problem Relation Age of Onset   Leukemia Mother    Breast cancer Half-Sister    Heart disease Maternal Grandfather    Diabetes Maternal Grandfather    Breast cancer Cousin     Social History: Social History   Tobacco Use  Smoking Status Every Day   Packs/day: 0.50   Years: 40.00   Pack years: 20.00   Types: Cigarettes  Smokeless Tobacco Never   Social History   Substance and Sexual Activity  Alcohol Use Never   Social History   Substance and Sexual Activity  Drug Use Never    Allergies: Allergies  Allergen Reactions   Augmentin [Amoxicillin-Pot Clavulanate] Anaphylaxis   Ciprofloxacin Anaphylaxis   Claritin [Loratadine] Anaphylaxis   Gabapentin Anaphylaxis   Keflex [Cephalexin] Anaphylaxis   Levofloxacin Anaphylaxis  Lisinopril Anaphylaxis   Metoprolol Anaphylaxis   Nexium [Esomeprazole] Anaphylaxis   Tetracyclines & Related Anaphylaxis   Tetracycline Hcl     Tongue swelling     Chromium Other (See Comments)    headache   Oxycodone Itching   Sulfa Antibiotics    Latex Rash    itching   Nickel Rash    itching    Medications: Current Outpatient Medications  Medication Sig Dispense Refill   amLODipine  (NORVASC) 5 MG tablet Take 5 mg by mouth daily.     Ascorbic Acid (VITAMIN C) 1000 MG tablet Take 1,000 mg by mouth daily.     aspirin 81 MG chewable tablet Chew by mouth.     aspirin EC 81 MG tablet Take 81 mg by mouth daily. Swallow whole.     butalbital-acetaminophen-caffeine (FIORICET) 50-325-40 MG tablet Take 1 tablet by mouth as needed for headache. 30 tablet 5   Calcium-Magnesium-Vitamin D (CALCIUM 1200+D3 PO) Take 1 tablet by mouth daily.     cetirizine (ZYRTEC) 10 MG tablet Take 10 mg by mouth daily.     CINNAMON PO Take 1 tablet by mouth daily. 1,000 daily     cyanocobalamin 1000 MCG tablet Take 1 tablet (1,000 mcg total) by mouth daily. 30 tablet 6   cyclobenzaprine (FLEXERIL) 10 MG tablet Take 1 tablet (10 mg total) by mouth 2 (two) times daily as needed for muscle spasms. 60 tablet 5   denosumab (XGEVA) 120 MG/1.7ML SOLN injection Inject 120 mg into the skin every 30 (thirty) days.     fulvestrant (FASLODEX) 250 MG/5ML injection Inject 500 mg into the muscle every 30 (thirty) days. One injection each buttock over 1-2 minutes. Warm prior to use.     furosemide (LASIX) 20 MG tablet Take 20 mg by mouth as needed.     HYDROcodone-acetaminophen (NORCO/VICODIN) 5-325 MG tablet Take 1 tablet by mouth daily as needed for moderate pain. 30 tablet 0   lansoprazole (PREVACID) 15 MG capsule Take 15 mg by mouth daily at 12 noon. Pt takes 1-2 tablets daily     naphazoline-pheniramine (NAPHCON-A) 0.025-0.3 % ophthalmic solution Inject into the eye.     NAPHCON-A 0.025-0.3 % ophthalmic solution 2 drops 3 (three) times daily.     palbociclib (IBRANCE) 100 MG tablet Take 1 tablet (100 mg total) by mouth daily. Take for 21 days on, 7 days off, repeat every 28 days. 21 tablet 3   PROAIR HFA 108 (90 Base) MCG/ACT inhaler SMARTSIG:2 Puff(s) By Mouth Every 4 Hours PRN     No current facility-administered medications for this visit.    Review of Systems: GENERAL: negative for malaise, night  sweats HEENT: No changes in hearing or vision, no nose bleeds or other nasal problems. NECK: Negative for lumps, goiter, pain and significant neck swelling RESPIRATORY: Negative for cough, wheezing CARDIOVASCULAR: Negative for chest pain, leg swelling, palpitations, orthopnea GI: SEE HPI MUSCULOSKELETAL: Negative for joint pain or swelling, back pain, and muscle pain. SKIN: Negative for lesions, rash PSYCH: Negative for sleep disturbance, mood disorder and recent psychosocial stressors. HEMATOLOGY Negative for prolonged bleeding, bruising easily, and swollen nodes. ENDOCRINE: Negative for cold or heat intolerance, polyuria, polydipsia and goiter. NEURO: negative for tremor, gait imbalance, syncope and seizures. The remainder of the review of systems is noncontributory.   Physical Exam: BP 130/73 (BP Location: Right Arm, Patient Position: Sitting, Cuff Size: Large)    Pulse 84    Temp 99.3 F (37.4 C) (Oral)    Ht 5'  4" (1.626 m)    Wt 176 lb 11.2 oz (80.2 kg)    BMI 30.33 kg/m  GENERAL: The patient is AO x3, in no acute distress. HEENT: Head is normocephalic and atraumatic. EOMI are intact. Mouth is well hydrated and without lesions. NECK: Supple. No masses LUNGS: Clear to auscultation. No presence of rhonchi/wheezing/rales. Adequate chest expansion CHEST: Status post bilateral mastectomy.  Tender to palpation in the xiphoid process area. HEART: RRR, normal s1 and s2. ABDOMEN: Soft, nontender, no guarding, no peritoneal signs, and nondistended. BS +. No masses. EXTREMITIES: Without any cyanosis, clubbing, rash, lesions or edema. NEUROLOGIC: AOx3, no focal motor deficit. SKIN: no jaundice, no rashes   Imaging/Labs: as above  I personally reviewed and interpreted the available labs, imaging and endoscopic files.  Impression and Plan: Sherilynn Dieu is a 60 y.o. female with metastatic breast cancer s/p bilateral mastectomy, radiation therapy and currently on chemotherapy on low dose  Ibrance, Faslodex, Xgeva) , who presents for evaluation of dysphagia.  The patient has presented recurrent episodes of dysphagia of unclear etiology.  This could be related to ongoing and persistent reflux episodes but will need to explore her symptoms further with an EGD.  Also, will optimize her acid reflux control with higher dose Prevacid for now.  I explained to her that if the endoscopic findings are unremarkable, we may need to repeat the modified barium swallow with speech and swallow therapist to clarify the findings on her previous MBS.  Patient understood and agreed.  - Schedule EGD  - Increase Prevacid to 30 mg twice a day - May consider repeating MBS based on EGD findings.  All questions were answered.      [name], MD Gastroenterology and Hepatology [facility]

## 2021-12-27 NOTE — H&P (View-Only) (Signed)
Wendy Zamora, M.D. Gastroenterology & Hepatology [facility] [address] [place], [place] [address] Primary Care Physician: Pcp, No No address on file  Referring MD: Derek Jack, MD  Chief Complaint: Dysphagia  History of Present Illness: Wendy Zamora is a 60 y.o. female with metastatic breast cancer s/p bilateral mastectomy, radiation therapy and currently on chemotherapy on low dose Ibrance, Faslodex, Xgeva) , who presents for evaluation of dysphagia.  Patient reports that 6 weeks after starting Ibrance (11/2020), she had a new episode of sudden onset of "feeling her throat was closing" and she has had recurrent intermittent episodes of dysphagia with both solid and liquid food. She reported the dysphagia does not happen every time but has been less severe recently.She reports having heartburn frequently as well as regurgitation. She has symptoms every day. No odynophagia. She takes the OTC lanzoprazole daily, but most recently she is taking it twice a day. She states that it does not completely control the symptoms.Has also noticed some tenderness in the mid sternal area recently.  The patient denies having any nausea, vomiting, fever, chills, hematochezia, melena, hematemesis, abdominal distention, abdominal pain, diarrhea, jaundice, pruritus or weight loss.  Patient reports she had MBS while she lived in the [place]. She reports that she was told she had decreased muscle contraction of roughly 30%.  Unfortunately, no reports are available.  Last QIH:KVQQV Last Colonoscopy:2021  - reports she had 4 polyps removed, no reports are available - performed at the [facility]. Asked to repeat in 5 years.  FHx: neg for any gastrointestinal/liver disease,mother leukemia 59 years, sister breast cancer Social: smokes 1/2 pack a day, neg alcohol or illicit drug use Surgical: no abdominal surgeries  Past Medical History: Past  Medical History:  Diagnosis Date   Allergy    Asthma    diagnosed age 57   Bone metastasis ([place]) 08/2020   was on prolia   Breast cancer ([place])    started chemo 05/05/2019. finished 08/2019   Gangrene ([place])    GERD (gastroesophageal reflux disease)    Hypertension    started bp meds 2008   Lymphedema of arm    left arm   Osteopenia    Shingles 04/20/2020   left side    Past Surgical History: Past Surgical History:  Procedure Laterality Date   CARPAL TUNNEL RELEASE Bilateral 2006   hysteroscopy     age 6   mastectomy Bilateral 10/22/2019   pt had necrosis after 1st surgery, had another surgery to remove implants 12/10/2019   TUBAL LIGATION Bilateral 1998   WISDOM TOOTH EXTRACTION     early 6's    Family History: Family History  Problem Relation Age of Onset   Leukemia Mother    Breast cancer Half-Sister    Heart disease Maternal Grandfather    Diabetes Maternal Grandfather    Breast cancer Cousin     Social History: Social History   Tobacco Use  Smoking Status Every Day   Packs/day: 0.50   Years: 40.00   Pack years: 20.00   Types: Cigarettes  Smokeless Tobacco Never   Social History   Substance and Sexual Activity  Alcohol Use Never   Social History   Substance and Sexual Activity  Drug Use Never    Allergies: Allergies  Allergen Reactions   Augmentin [Amoxicillin-Pot Clavulanate] Anaphylaxis   Ciprofloxacin Anaphylaxis   Claritin [Loratadine] Anaphylaxis   Gabapentin Anaphylaxis   Keflex [Cephalexin] Anaphylaxis   Levofloxacin Anaphylaxis  Lisinopril Anaphylaxis   Metoprolol Anaphylaxis   Nexium [Esomeprazole] Anaphylaxis   Tetracyclines & Related Anaphylaxis   Tetracycline Hcl     Tongue swelling     Chromium Other (See Comments)    headache   Oxycodone Itching   Sulfa Antibiotics    Latex Rash    itching   Nickel Rash    itching    Medications: Current Outpatient Medications  Medication Sig Dispense Refill   amLODipine  (NORVASC) 5 MG tablet Take 5 mg by mouth daily.     Ascorbic Acid (VITAMIN C) 1000 MG tablet Take 1,000 mg by mouth daily.     aspirin 81 MG chewable tablet Chew by mouth.     aspirin EC 81 MG tablet Take 81 mg by mouth daily. Swallow whole.     butalbital-acetaminophen-caffeine (FIORICET) 50-325-40 MG tablet Take 1 tablet by mouth as needed for headache. 30 tablet 5   Calcium-Magnesium-Vitamin D (CALCIUM 1200+D3 PO) Take 1 tablet by mouth daily.     cetirizine (ZYRTEC) 10 MG tablet Take 10 mg by mouth daily.     CINNAMON PO Take 1 tablet by mouth daily. 1,000 daily     cyanocobalamin 1000 MCG tablet Take 1 tablet (1,000 mcg total) by mouth daily. 30 tablet 6   cyclobenzaprine (FLEXERIL) 10 MG tablet Take 1 tablet (10 mg total) by mouth 2 (two) times daily as needed for muscle spasms. 60 tablet 5   denosumab (XGEVA) 120 MG/1.7ML SOLN injection Inject 120 mg into the skin every 30 (thirty) days.     fulvestrant (FASLODEX) 250 MG/5ML injection Inject 500 mg into the muscle every 30 (thirty) days. One injection each buttock over 1-2 minutes. Warm prior to use.     furosemide (LASIX) 20 MG tablet Take 20 mg by mouth as needed.     HYDROcodone-acetaminophen (NORCO/VICODIN) 5-325 MG tablet Take 1 tablet by mouth daily as needed for moderate pain. 30 tablet 0   lansoprazole (PREVACID) 15 MG capsule Take 15 mg by mouth daily at 12 noon. Pt takes 1-2 tablets daily     naphazoline-pheniramine (NAPHCON-A) 0.025-0.3 % ophthalmic solution Inject into the eye.     NAPHCON-A 0.025-0.3 % ophthalmic solution 2 drops 3 (three) times daily.     palbociclib (IBRANCE) 100 MG tablet Take 1 tablet (100 mg total) by mouth daily. Take for 21 days on, 7 days off, repeat every 28 days. 21 tablet 3   PROAIR HFA 108 (90 Base) MCG/ACT inhaler SMARTSIG:2 Puff(s) By Mouth Every 4 Hours PRN     No current facility-administered medications for this visit.    Review of Systems: GENERAL: negative for malaise, night  sweats HEENT: No changes in hearing or vision, no nose bleeds or other nasal problems. NECK: Negative for lumps, goiter, pain and significant neck swelling RESPIRATORY: Negative for cough, wheezing CARDIOVASCULAR: Negative for chest pain, leg swelling, palpitations, orthopnea GI: SEE HPI MUSCULOSKELETAL: Negative for joint pain or swelling, back pain, and muscle pain. SKIN: Negative for lesions, rash PSYCH: Negative for sleep disturbance, mood disorder and recent psychosocial stressors. HEMATOLOGY Negative for prolonged bleeding, bruising easily, and swollen nodes. ENDOCRINE: Negative for cold or heat intolerance, polyuria, polydipsia and goiter. NEURO: negative for tremor, gait imbalance, syncope and seizures. The remainder of the review of systems is noncontributory.   Physical Exam: BP 130/73 (BP Location: Right Arm, Patient Position: Sitting, Cuff Size: Large)    Pulse 84    Temp 99.3 F (37.4 C) (Oral)    Ht 5'  4" (1.626 m)    Wt 176 lb 11.2 oz (80.2 kg)    BMI 30.33 kg/m  GENERAL: The patient is AO x3, in no acute distress. HEENT: Head is normocephalic and atraumatic. EOMI are intact. Mouth is well hydrated and without lesions. NECK: Supple. No masses LUNGS: Clear to auscultation. No presence of rhonchi/wheezing/rales. Adequate chest expansion CHEST: Status post bilateral mastectomy.  Tender to palpation in the xiphoid process area. HEART: RRR, normal s1 and s2. ABDOMEN: Soft, nontender, no guarding, no peritoneal signs, and nondistended. BS +. No masses. EXTREMITIES: Without any cyanosis, clubbing, rash, lesions or edema. NEUROLOGIC: AOx3, no focal motor deficit. SKIN: no jaundice, no rashes   Imaging/Labs: as above  I personally reviewed and interpreted the available labs, imaging and endoscopic files.  Impression and Plan: Dellamae Rosamilia is a 60 y.o. female with metastatic breast cancer s/p bilateral mastectomy, radiation therapy and currently on chemotherapy on low dose  Ibrance, Faslodex, Xgeva) , who presents for evaluation of dysphagia.  The patient has presented recurrent episodes of dysphagia of unclear etiology.  This could be related to ongoing and persistent reflux episodes but will need to explore her symptoms further with an EGD.  Also, will optimize her acid reflux control with higher dose Prevacid for now.  I explained to her that if the endoscopic findings are unremarkable, we may need to repeat the modified barium swallow with speech and swallow therapist to clarify the findings on her previous MBS.  Patient understood and agreed.  - Schedule EGD  - Increase Prevacid to 30 mg twice a day - May consider repeating MBS based on EGD findings.  All questions were answered.      Wendy Peppers, MD Gastroenterology and Hepatology [facility]

## 2021-12-27 NOTE — Patient Instructions (Addendum)
Schedule EGD  Increase Prevacid to 30 mg twice a day May consider repeating MBS based on findings.

## 2022-01-02 ENCOUNTER — Encounter (INDEPENDENT_AMBULATORY_CARE_PROVIDER_SITE_OTHER): Payer: Self-pay

## 2022-01-03 ENCOUNTER — Inpatient Hospital Stay (HOSPITAL_COMMUNITY): Payer: Medicaid Other | Attending: Hematology

## 2022-01-03 ENCOUNTER — Other Ambulatory Visit (HOSPITAL_COMMUNITY): Payer: Self-pay | Admitting: Hematology

## 2022-01-03 ENCOUNTER — Inpatient Hospital Stay (HOSPITAL_COMMUNITY): Payer: Medicaid Other

## 2022-01-03 ENCOUNTER — Other Ambulatory Visit: Payer: Self-pay

## 2022-01-03 VITALS — BP 128/81 | HR 78 | Temp 97.2°F | Resp 20

## 2022-01-03 DIAGNOSIS — C50912 Malignant neoplasm of unspecified site of left female breast: Secondary | ICD-10-CM | POA: Diagnosis not present

## 2022-01-03 DIAGNOSIS — Z170 Estrogen receptor positive status [ER+]: Secondary | ICD-10-CM | POA: Diagnosis not present

## 2022-01-03 DIAGNOSIS — C50919 Malignant neoplasm of unspecified site of unspecified female breast: Secondary | ICD-10-CM

## 2022-01-03 DIAGNOSIS — R1310 Dysphagia, unspecified: Secondary | ICD-10-CM | POA: Diagnosis not present

## 2022-01-03 DIAGNOSIS — Z79899 Other long term (current) drug therapy: Secondary | ICD-10-CM | POA: Diagnosis not present

## 2022-01-03 DIAGNOSIS — C7951 Secondary malignant neoplasm of bone: Secondary | ICD-10-CM | POA: Diagnosis not present

## 2022-01-03 DIAGNOSIS — E538 Deficiency of other specified B group vitamins: Secondary | ICD-10-CM

## 2022-01-03 DIAGNOSIS — M898X9 Other specified disorders of bone, unspecified site: Secondary | ICD-10-CM | POA: Insufficient documentation

## 2022-01-03 DIAGNOSIS — Z5111 Encounter for antineoplastic chemotherapy: Secondary | ICD-10-CM | POA: Diagnosis not present

## 2022-01-03 LAB — CBC WITH DIFFERENTIAL/PLATELET
Abs Immature Granulocytes: 0.01 10*3/uL (ref 0.00–0.07)
Basophils Absolute: 0 10*3/uL (ref 0.0–0.1)
Basophils Relative: 1 %
Eosinophils Absolute: 0 10*3/uL (ref 0.0–0.5)
Eosinophils Relative: 1 %
HCT: 38 % (ref 36.0–46.0)
Hemoglobin: 13.1 g/dL (ref 12.0–15.0)
Immature Granulocytes: 0 %
Lymphocytes Relative: 43 %
Lymphs Abs: 1.3 10*3/uL (ref 0.7–4.0)
MCH: 35.7 pg — ABNORMAL HIGH (ref 26.0–34.0)
MCHC: 34.5 g/dL (ref 30.0–36.0)
MCV: 103.5 fL — ABNORMAL HIGH (ref 80.0–100.0)
Monocytes Absolute: 0.5 10*3/uL (ref 0.1–1.0)
Monocytes Relative: 17 %
Neutro Abs: 1.1 10*3/uL — ABNORMAL LOW (ref 1.7–7.7)
Neutrophils Relative %: 38 %
Platelets: 252 10*3/uL (ref 150–400)
RBC: 3.67 MIL/uL — ABNORMAL LOW (ref 3.87–5.11)
RDW: 13.4 % (ref 11.5–15.5)
WBC Morphology: REACTIVE
WBC: 3 10*3/uL — ABNORMAL LOW (ref 4.0–10.5)
nRBC: 0 % (ref 0.0–0.2)

## 2022-01-03 LAB — COMPREHENSIVE METABOLIC PANEL
ALT: 22 U/L (ref 0–44)
AST: 21 U/L (ref 15–41)
Albumin: 4.4 g/dL (ref 3.5–5.0)
Alkaline Phosphatase: 66 U/L (ref 38–126)
Anion gap: 12 (ref 5–15)
BUN: 16 mg/dL (ref 6–20)
CO2: 24 mmol/L (ref 22–32)
Calcium: 9.4 mg/dL (ref 8.9–10.3)
Chloride: 106 mmol/L (ref 98–111)
Creatinine, Ser: 0.85 mg/dL (ref 0.44–1.00)
GFR, Estimated: >60 mL/min (ref >60)
Glucose, Bld: 86 mg/dL (ref 70–99)
Potassium: 4.1 mmol/L (ref 3.5–5.1)
Sodium: 142 mmol/L (ref 135–145)
Total Bilirubin: 0.5 mg/dL (ref 0.3–1.2)
Total Protein: 7.7 g/dL (ref 6.5–8.1)

## 2022-01-03 LAB — VITAMIN B12: Vitamin B-12: 572 pg/mL (ref 180–914)

## 2022-01-03 MED ORDER — DENOSUMAB 120 MG/1.7ML ~~LOC~~ SOLN
120.0000 mg | Freq: Once | SUBCUTANEOUS | Status: AC
  Administered 2022-01-03: 120 mg via SUBCUTANEOUS
  Filled 2022-01-03: qty 1.7

## 2022-01-03 MED ORDER — FULVESTRANT 250 MG/5ML IM SOSY
500.0000 mg | PREFILLED_SYRINGE | Freq: Once | INTRAMUSCULAR | Status: AC
  Administered 2022-01-03: 500 mg via INTRAMUSCULAR
  Filled 2022-01-03: qty 10

## 2022-01-03 NOTE — Progress Notes (Signed)
Wendy Zamora presents today for Xgeva and Faslodex injections per the provider's orders. Patient has had no jaw pain or prior or upcoming dental work.  Stable during administration without incident; injection site WNL; see MAR for injection details.  Patient tolerated procedure well and without incident.  No questions or complaints noted at this time. Discharge from clinic ambulatory in stable condition.  Alert and oriented X 3.  Follow up with [facility] as scheduled.

## 2022-01-03 NOTE — Patient Instructions (Signed)
Wendy Zamora  Discharge Instructions: Thank you for choosing [facility] to provide your oncology and hematology care.  If you have a lab appointment with the [facility], please come in thru the Main Entrance and check in at the main information desk.  Wear comfortable clothing and clothing appropriate for easy access to any Portacath or PICC line.   We strive to give you quality time with your provider. You may need to reschedule your appointment if you arrive late (15 or more minutes).  Arriving late affects you and other patients whose appointments are after yours.  Also, if you miss three or more appointments without notifying the office, you may be dismissed from the clinic at the providers discretion.      For prescription refill requests, have your pharmacy contact our office and allow 72 hours for refills to be completed.    Today you received the following chemotherapy and/or immunotherapy agents Xgeva Faslodex      To help prevent nausea and vomiting after your treatment, we encourage you to take your nausea medication as directed.  BELOW ARE SYMPTOMS THAT SHOULD BE REPORTED IMMEDIATELY: *FEVER GREATER THAN 100.4 F (38 C) OR HIGHER *CHILLS OR SWEATING *NAUSEA AND VOMITING THAT IS NOT CONTROLLED WITH YOUR NAUSEA MEDICATION *UNUSUAL SHORTNESS OF BREATH *UNUSUAL BRUISING OR BLEEDING *URINARY PROBLEMS (pain or burning when urinating, or frequent urination) *BOWEL PROBLEMS (unusual diarrhea, constipation, pain near the anus) TENDERNESS IN MOUTH AND THROAT WITH OR WITHOUT PRESENCE OF ULCERS (sore throat, sores in mouth, or a toothache) UNUSUAL RASH, SWELLING OR PAIN  UNUSUAL VAGINAL DISCHARGE OR ITCHING   Items with * indicate a potential emergency and should be followed up as soon as possible or go to the Emergency Department if any problems should occur.  Please show the CHEMOTHERAPY ALERT CARD or IMMUNOTHERAPY ALERT CARD at check-in to the Emergency  Department and triage nurse.  Should you have questions after your visit or need to cancel or reschedule your appointment, please contact [facility] [phone]  and follow the prompts.  Office hours are 8:00 a.m. to 4:30 p.m. Monday - Friday. Please note that voicemails left after 4:00 p.m. may not be returned until the following business day.  We are closed weekends and major holidays. You have access to a nurse at all times for urgent questions. Please call the main number to the clinic [phone] and follow the prompts.  For any non-urgent questions, you may also contact your provider using MyChart. We now offer e-Visits for anyone 10 and older to request care online for non-urgent symptoms. For details visit [email or web address].   Also download the MyChart app! Go to the app store, search "MyChart", open the app, select [facility], and log in with your MyChart username and password.  Due to Covid, a mask is required upon entering the hospital/clinic. If you do not have a mask, one will be given to you upon arrival. For doctor visits, patients may have 1 support person aged 60 or older with them. For treatment visits, patients cannot have anyone with them due to current Covid guidelines and our immunocompromised population.

## 2022-01-04 LAB — CANCER ANTIGEN 15-3: CA 15-3: 32 U/mL — ABNORMAL HIGH (ref 0.0–25.0)

## 2022-01-04 LAB — CANCER ANTIGEN 27.29: CA 27.29: 26.6 U/mL (ref 0.0–38.6)

## 2022-01-17 NOTE — Patient Instructions (Signed)
Wendy Zamora  01/17/2022     @PREFPERIOPPHARMACY @   Your procedure is scheduled on  01/22/2022.   Report to [facility] at  1130  A.M.   Call this number if you have problems the morning of surgery:  [phone]   Remember:  Follow the diet instructions given to you by the office.    Use your inhaler before you come and bring your rescue inhaler with you.    Take these medicines the morning of surgery with A SIP OF WATER        amlodipine, fioricet(if needed), zyrtec, flexeril(if needed), prevacid.     Do not wear jewelry, make-up or nail polish.  Do not wear lotions, powders, or perfumes, or deodorant.  Do not shave 48 hours prior to surgery.  Men may shave face and neck.  Do not bring valuables to the hospital.  [facility] is not responsible for any belongings or valuables.  Contacts, dentures or bridgework may not be worn into surgery.  Leave your suitcase in the car.  After surgery it may be brought to your room.  For patients admitted to the hospital, discharge time will be determined by your treatment team.  Patients discharged the day of surgery will not be allowed to drive home and must have someone with them for 24 hours.    Special instructions:   DO NOT smoke tobacco or vape for 24 hours before your procedure.  Please read over the following fact sheets that you were given. Anesthesia Post-op Instructions and Care and Recovery After Surgery      Upper Endoscopy, Adult, Care After This sheet gives you information about how to care for yourself after your procedure. Your health care provider may also give you more specific instructions. If you have problems or questions, contact your health care provider. What can I expect after the procedure? After the procedure, it is common to have: A sore throat. Mild stomach pain or discomfort. Bloating. Nausea. Follow these instructions at home:  Follow instructions from your health care provider about  what to eat or drink after your procedure. Return to your normal activities as told by your health care provider. Ask your health care provider what activities are safe for you. Take over-the-counter and prescription medicines only as told by your health care provider. If you were given a sedative during the procedure, it can affect you for several hours. Do not drive or operate machinery until your health care provider says that it is safe. Keep all follow-up visits as told by your health care provider. This is important. Contact a health care provider if you have: A sore throat that lasts longer than one day. Trouble swallowing. Get help right away if: You vomit blood or your vomit looks like coffee grounds. You have: A fever. Bloody, black, or tarry stools. A severe sore throat or you cannot swallow. Difficulty breathing. Severe pain in your chest or abdomen. Summary After the procedure, it is common to have a sore throat, mild stomach discomfort, bloating, and nausea. If you were given a sedative during the procedure, it can affect you for several hours. Do not drive or operate machinery until your health care provider says that it is safe. Follow instructions from your health care provider about what to eat or drink after your procedure. Return to your normal activities as told by your health care provider. This information is not intended to replace advice given to you by  your health care provider. Make sure you discuss any questions you have with your health care provider. Document Revised: 10/22/2019 Document Reviewed: 05/18/2018 Elsevier Patient Education  2022 [place] After This sheet gives you information about how to care for yourself after your procedure. Your health care provider may also give you more specific instructions. If you have problems or questions, contact your health care provider. What can I expect after the procedure? After  the procedure, it is common to have: Tiredness. Forgetfulness about what happened after the procedure. Impaired judgment for important decisions. Nausea or vomiting. Some difficulty with balance. Follow these instructions at home: For the time period you were told by your health care provider:   Rest as needed. Do not participate in activities where you could fall or become injured. Do not drive or use machinery. Do not drink alcohol. Do not take sleeping pills or medicines that cause drowsiness. Do not make important decisions or sign legal documents. Do not take care of children on your own. Eating and drinking Follow the diet that is recommended by your health care provider. Drink enough fluid to keep your urine pale yellow. If you vomit: Drink water, juice, or soup when you can drink without vomiting. Make sure you have little or no nausea before eating solid foods. General instructions Have a responsible adult stay with you for the time you are told. It is important to have someone help care for you until you are awake and alert. Take over-the-counter and prescription medicines only as told by your health care provider. If you have sleep apnea, surgery and certain medicines can increase your risk for breathing problems. Follow instructions from your health care provider about wearing your sleep device: Anytime you are sleeping, including during daytime naps. While taking prescription pain medicines, sleeping medicines, or medicines that make you drowsy. Avoid smoking. Keep all follow-up visits as told by your health care provider. This is important. Contact a health care provider if: You keep feeling nauseous or you keep vomiting. You feel light-headed. You are still sleepy or having trouble with balance after 24 hours. You develop a rash. You have a fever. You have redness or swelling around the IV site. Get help right away if: You have trouble breathing. You have  new-onset confusion at home. Summary For several hours after your procedure, you may feel tired. You may also be forgetful and have poor judgment. Have a responsible adult stay with you for the time you are told. It is important to have someone help care for you until you are awake and alert. Rest as told. Do not drive or operate machinery. Do not drink alcohol or take sleeping pills. Get help right away if you have trouble breathing, or if you suddenly become confused. This information is not intended to replace advice given to you by your health care provider. Make sure you discuss any questions you have with your health care provider. Document Revised: 08/31/2020 Document Reviewed: 11/18/2019 Elsevier Patient Education  2022 [employer].

## 2022-01-18 ENCOUNTER — Encounter: Payer: Self-pay | Admitting: Nurse Practitioner

## 2022-01-18 ENCOUNTER — Encounter (HOSPITAL_COMMUNITY): Payer: Self-pay

## 2022-01-18 ENCOUNTER — Other Ambulatory Visit: Payer: Self-pay

## 2022-01-18 ENCOUNTER — Encounter (HOSPITAL_COMMUNITY)
Admission: RE | Admit: 2022-01-18 | Discharge: 2022-01-18 | Disposition: A | Discharge status: Home / Self Care | Payer: Medicaid Other | Source: Ambulatory Visit | Attending: Gastroenterology | Admitting: Gastroenterology

## 2022-01-18 ENCOUNTER — Ambulatory Visit (INDEPENDENT_AMBULATORY_CARE_PROVIDER_SITE_OTHER): Payer: Medicaid Other | Admitting: Nurse Practitioner

## 2022-01-18 ENCOUNTER — Encounter (HOSPITAL_COMMUNITY): Payer: Self-pay | Admitting: Hematology and Oncology

## 2022-01-18 VITALS — BP 127/81 | HR 90 | Ht 64.0 in | Wt 179.0 lb

## 2022-01-18 DIAGNOSIS — G43909 Migraine, unspecified, not intractable, without status migrainosus: Secondary | ICD-10-CM | POA: Insufficient documentation

## 2022-01-18 DIAGNOSIS — E669 Obesity, unspecified: Secondary | ICD-10-CM | POA: Diagnosis not present

## 2022-01-18 DIAGNOSIS — Z01810 Encounter for preprocedural cardiovascular examination: Secondary | ICD-10-CM | POA: Diagnosis not present

## 2022-01-18 DIAGNOSIS — K219 Gastro-esophageal reflux disease without esophagitis: Secondary | ICD-10-CM

## 2022-01-18 DIAGNOSIS — J45909 Unspecified asthma, uncomplicated: Secondary | ICD-10-CM | POA: Insufficient documentation

## 2022-01-18 DIAGNOSIS — R1310 Dysphagia, unspecified: Secondary | ICD-10-CM

## 2022-01-18 DIAGNOSIS — E538 Deficiency of other specified B group vitamins: Secondary | ICD-10-CM

## 2022-01-18 DIAGNOSIS — T7840XS Allergy, unspecified, sequela: Secondary | ICD-10-CM

## 2022-01-18 DIAGNOSIS — I890 Lymphedema, not elsewhere classified: Secondary | ICD-10-CM

## 2022-01-18 DIAGNOSIS — T7840XA Allergy, unspecified, initial encounter: Secondary | ICD-10-CM | POA: Insufficient documentation

## 2022-01-18 DIAGNOSIS — Z139 Encounter for screening, unspecified: Secondary | ICD-10-CM

## 2022-01-18 DIAGNOSIS — M810 Age-related osteoporosis without current pathological fracture: Secondary | ICD-10-CM | POA: Insufficient documentation

## 2022-01-18 DIAGNOSIS — R42 Dizziness and giddiness: Secondary | ICD-10-CM

## 2022-01-18 DIAGNOSIS — J4520 Mild intermittent asthma, uncomplicated: Secondary | ICD-10-CM

## 2022-01-18 DIAGNOSIS — F17200 Nicotine dependence, unspecified, uncomplicated: Secondary | ICD-10-CM

## 2022-01-18 DIAGNOSIS — I10 Essential (primary) hypertension: Secondary | ICD-10-CM | POA: Insufficient documentation

## 2022-01-18 DIAGNOSIS — C50919 Malignant neoplasm of unspecified site of unspecified female breast: Secondary | ICD-10-CM

## 2022-01-18 NOTE — Assessment & Plan Note (Signed)
Well-controlled she takes Fioricet as needed for her migraine.

## 2022-01-18 NOTE — Assessment & Plan Note (Signed)
Continue albuterol inhaler as needed asthma well-controlled, she has not used her inhaler in a long time

## 2022-01-18 NOTE — Assessment & Plan Note (Signed)
She takes zyrtec  daily for her allergies

## 2022-01-18 NOTE — Assessment & Plan Note (Signed)
DASH diet and commitment to daily physical activity for a minimum of 30 minutes discussed and encouraged, as a part of hypertension management. The importance of attaining a healthy weight is also discussed.  BP/Weight 01/18/2022 01/03/2022 12/27/2021 12/06/2021 11/08/2021 10/11/2021 6/34/9494  Systolic BP 473 958 441 712 [phone]  Diastolic BP 81 81 73 78 82 92 66  Wt. (Lbs) 179 - 176.7 177.8 - 174.8 177.91  BMI 30.73 - 30.33 30.52 - - -   Takes amlodipine 5 mg daily

## 2022-01-18 NOTE — Progress Notes (Signed)
New Patient Office Visit  Subjective:  Patient ID: Wendy Zamora, female    DOB: 08-01-1961  Age: 61 y.o. MRN: [id number]  CC:  Chief Complaint  Patient presents with   New Patient (Initial Visit)    New pt. Previous PCP in [place]. Would like to discuss weight gain since being on Ibance. Has had dizziness since 1/18 when first waking up. Will last 1-2 hours and then fades    HPI Wendy Zamora presents to establish.  Her previous PCP is in the [facility]   Dizziness. Pt c/o dizziness, she stated that when I get up in the morning I get light ended and dizzy and then it fades away, symptoms started a week ago. Denies CP palpitation, fainting spells, roof spinning over her.   She is not sure of when her last annual physical was. She Due for PAP smear  Breast cancer. She has had double mastectomy for breast cancer. Dr Tera Helper is her oncology.   Difficulty in swallowing. Has an endoscopy next week for  difficulty in swallowing from taking Ibrance.Her GI Dr.is Wendy Zamora.Stated that her last colonoscopy was in 2021, repeat in 5 years per GI.  She has had 3 covid vaccines, need to get her booster, has not had pneumonia vaccine and  tdap vaccines.  Pt will check with her oncologist about getting TDAP and  pneumonia vaccines.   HTN .  Takes amlodipine 5 mg daily, hydrochlorothiazide 12.5 mg capsule daily MIGRAINE takes Fioricet as needed for migraine headache. OSTEOPENIA takes calcium plus vitamin D daily. ALLERGIES takes cetirizine daily for her allergies VITAMIN B DEFF vitamin B 12 1000 mcg daily GERD lansoprazole 30mg  BID  ASTHMA Albuterol inhaler PRN oncology med Ibrance 100mg  daily, fulvestrant injection 250mg /66ml monthly, denosumab injection,    Past Medical History:  Diagnosis Date   Allergy    Asthma    diagnosed age 66   Bone metastasis ([place]) 08/2020   was on prolia   Breast cancer ([place])    started chemo 05/05/2019. finished 08/2019    Gangrene ([place])    GERD (gastroesophageal reflux disease)    Hypertension    started bp meds 2008   Lymphedema of arm    left arm   Osteopenia    Shingles 04/20/2020   left side    Past Surgical History:  Procedure Laterality Date   CARPAL TUNNEL RELEASE Bilateral 2006   hysteroscopy     age 34   mastectomy Bilateral 10/22/2019   pt had necrosis after 1st surgery, had another surgery to remove implants 12/10/2019   TUBAL LIGATION Bilateral 1998   WISDOM TOOTH EXTRACTION     early 30's    Family History  Problem Relation Age of Onset   Leukemia Mother    Heart disease Maternal Grandfather    Diabetes Maternal Grandfather    Breast cancer Cousin    Breast cancer Half-Sister    Osteoporosis Half-Sister    Lupus Half-Sister     Social History   Socioeconomic History   Marital status: Married    Spouse name: Not on file   Number of children: 2   Years of education: Not on file   Highest education level: Not on file  Occupational History   Occupation: Disability  Tobacco Use   Smoking status: Every Day    Packs/day: 0.50    Years: 40.00    Pack years: 20.00    Types: Cigarettes   Smokeless tobacco: Never  Vaping Use   Vaping Use: Never used  Substance and Sexual Activity   Alcohol use: Never   Drug use: Never   Sexual activity: Not on file  Other Topics Concern   Not on file  Social History Narrative   Not on file   Social Determinants of Health   Financial Resource Strain: Low Risk    Difficulty of Paying Living Expenses: Not hard at all  Food Insecurity: No Food Insecurity   Worried About Charity fundraiser in the Last Year: Never true   [place] in the Last Year: Never true  Transportation Needs: No Transportation Needs   Lack of Transportation (Medical): No   Lack of Transportation (Non-Medical): No  Physical Activity: Insufficiently Active   Days of Exercise per Week: 5 days   Minutes of Exercise per Session: 10 min  Stress: No Stress  Concern Present   Feeling of Stress : Only a little  Social Connections: Moderately Isolated   Frequency of Communication with Friends and Family: Three times a week   Frequency of Social Gatherings with Friends and Family: Once a week   Attends Religious Services: Never   Marine scientist or Organizations: No   Attends Music therapist: Never   Marital Status: Married  Human resources officer Violence: Not At Risk   Fear of Current or Ex-Partner: No   Emotionally Abused: No   Physically Abused: No   Sexually Abused: No    ROS Review of Systems  Constitutional: Negative.   Respiratory: Negative.    Cardiovascular:        Edema on both hands, worse on the left due to lymphedema.  Dizziness   Skin: Negative.   Psychiatric/Behavioral: Negative.     Objective:   Today's Vitals: BP 127/81 (BP Location: Right Arm, Patient Position: Sitting, Cuff Size: Large)    Pulse 90    Ht 5\' 4"  (1.626 m)    Wt 179 lb (81.2 kg)    SpO2 98%    BMI 30.73 kg/m   Physical Exam Vitals and nursing note reviewed.  Constitutional:      General: She is not in acute distress.    Appearance: She is obese. She is not ill-appearing, toxic-appearing or diaphoretic.  Pulmonary:     Effort: Pulmonary effort is normal. No respiratory distress.     Breath sounds: Normal breath sounds. No stridor. No wheezing, rhonchi or rales.     Comments: Bilateral mastectomy Chest:     Chest wall: No tenderness.  Musculoskeletal:     Right upper arm: Edema present.     Left upper arm: Edema present.  Neurological:     Mental Status: She is alert.  Psychiatric:        Mood and Affect: Mood normal.        Thought Content: Thought content normal.        Judgment: Judgment normal.    Assessment & Plan:   Problem List Items Addressed This Visit   None   Outpatient Encounter Medications as of 01/18/2022  Medication Sig   amLODipine (NORVASC) 5 MG tablet Take 5 mg by mouth daily.   Ascorbic Acid  (VITAMIN C) 1000 MG tablet Take 1,000 mg by mouth daily.   aspirin 81 MG EC tablet Take 81 mg by mouth daily.   butalbital-acetaminophen-caffeine (FIORICET) 50-325-40 MG tablet Take 1 tablet by mouth as needed for headache.   Calcium-Magnesium-Vitamin D (CALCIUM 1200+D3 PO) Take 1 tablet by mouth  daily.   cetirizine-pseudoephedrine (ZYRTEC-D) 5-120 MG tablet Take 1 tablet by mouth daily.   CINNAMON PO Take 1 tablet by mouth daily. 1,000 daily   cyanocobalamin 1000 MCG tablet Take 1 tablet (1,000 mcg total) by mouth daily. (Patient taking differently: Take 1,000 mcg by mouth every other day.)   cyclobenzaprine (FLEXERIL) 10 MG tablet Take 1 tablet (10 mg total) by mouth 2 (two) times daily as needed for muscle spasms.   denosumab (XGEVA) 120 MG/1.7ML SOLN injection Inject 120 mg into the skin every 30 (thirty) days.   fulvestrant (FASLODEX) 250 MG/5ML injection Inject 500 mg into the muscle every 30 (thirty) days. One injection each buttock over 1-2 minutes. Warm prior to use.   furosemide (LASIX) 20 MG tablet Take 20 mg by mouth as needed.   HYDROcodone-acetaminophen (NORCO/VICODIN) 5-325 MG tablet Take 1 tablet by mouth daily as needed for moderate pain.   IBRANCE 100 MG tablet TAKE 1 TABLET BY MOUTH 1 TIME A DAY ON DAYS 1 TO 21 OF A 28 DAY CYCLE   lansoprazole (PREVACID) 30 MG capsule Take 1 capsule (30 mg total) by mouth in the morning and at bedtime.   NAPHCON-A 0.025-0.3 % ophthalmic solution Place 2 drops into both eyes in the morning and at bedtime.   PROAIR HFA 108 (90 Base) MCG/ACT inhaler Inhale 2 puffs into the lungs every 4 (four) hours as needed for wheezing or shortness of breath.   No facility-administered encounter medications on file as of 01/18/2022.    Follow-up: No follow-ups on file.   Renee Rival, FNP

## 2022-01-18 NOTE — Assessment & Plan Note (Signed)
Patient takes calcium with vitamin D tablets daily

## 2022-01-18 NOTE — Assessment & Plan Note (Addendum)
Patient states that she has dysphagia caused by  [facility].   She has an upcoming endoscopy.

## 2022-01-18 NOTE — Patient Instructions (Addendum)
Pease get your labs done 3-5 days before your next visit.  Please Get your pneumonia , TDAP, and COVID booster vaccines at your pharmacy    t is important that you exercise regularly at least 30 minutes 5 times a week.  Think about what you will eat, plan ahead. Choose " clean, green, fresh or frozen" over canned, processed or packaged foods which are more sugary, salty and fatty. 70 to 75% of food eaten should be vegetables and fruit. Three meals at set times with snacks allowed between meals, but they must be fruit or vegetables. Aim to eat over a 12 hour period , example 7 am to 7 pm, and STOP after  your last meal of the day. Drink water,generally about 64 ounces per day, no other drink is as healthy. Fruit juice is best enjoyed in a healthy way, by EATING the fruit.  Thanks for choosing [facility], we consider it a privelige to serve you.

## 2022-01-18 NOTE — Assessment & Plan Note (Signed)
Lymphedema of the arm caused by bilateral mastectomy.  Patient has a compression dressing on her arm.  She takes Lasix 20 mg as needed for swelling.

## 2022-01-18 NOTE — Assessment & Plan Note (Signed)
Takes lansoprazole 30 mg 2 times daily.

## 2022-01-19 DIAGNOSIS — R42 Dizziness and giddiness: Secondary | ICD-10-CM | POA: Insufficient documentation

## 2022-01-19 DIAGNOSIS — F17200 Nicotine dependence, unspecified, uncomplicated: Secondary | ICD-10-CM | POA: Insufficient documentation

## 2022-01-19 DIAGNOSIS — E538 Deficiency of other specified B group vitamins: Secondary | ICD-10-CM | POA: Insufficient documentation

## 2022-01-19 DIAGNOSIS — E669 Obesity, unspecified: Secondary | ICD-10-CM | POA: Insufficient documentation

## 2022-01-19 NOTE — Assessment & Plan Note (Signed)
Smokes half a pack a day for 40 years. Pt educated on the need to quit smoking, she verbalized understanding, she is not ready to quit at this time. Smoking cessation education resources given to pt.

## 2022-01-19 NOTE — Assessment & Plan Note (Signed)
Takes vitamin B12 1041mcg daily.

## 2022-01-19 NOTE — Assessment & Plan Note (Signed)
Importance of healthy food choices with portion control discussed as well as eating regularly within 12  hour window.   The need to choose clean green food 50%-75% of time is discussed as well as make water the primary drink and set a goal for 64 ounces daily.  Patient reeducated about the importance of committment to minimum of 150 minutes of exercise per week.  Three meals at set times with snacks allowed between meals but they must be fruit or vegetable.   Aim to eat  over 12 hour period  for example 7 am to 7 pm. Stop after your last meal of the day.  Wt Readings from Last 3 Encounters:  01/18/22 179 lb (81.2 kg)  01/18/22 179 lb (81.2 kg)  12/27/21 176 lb 11.2 oz (80.2 kg)

## 2022-01-19 NOTE — Assessment & Plan Note (Signed)
Pt told to get up slowly when standing up to prevent falls.  BP Readings from Last 3 Encounters:  01/18/22 118/67  01/18/22 127/81  01/03/22 128/81  will reassess the need to cut back on BP med at next visit.

## 2022-01-19 NOTE — Assessment & Plan Note (Signed)
Has had bilateral mastectomy Takes chemo meds.  followed by oncology.

## 2022-01-20 DIAGNOSIS — I972 Postmastectomy lymphedema syndrome: Secondary | ICD-10-CM | POA: Diagnosis not present

## 2022-01-21 ENCOUNTER — Encounter (HOSPITAL_COMMUNITY): Payer: Self-pay | Admitting: Hematology and Oncology

## 2022-01-22 ENCOUNTER — Ambulatory Visit (HOSPITAL_COMMUNITY)
Admission: RE | Admit: 2022-01-22 | Discharge: 2022-01-22 | Disposition: A | Discharge status: Home / Self Care | Payer: Medicaid Other | Attending: Gastroenterology | Admitting: Gastroenterology

## 2022-01-22 ENCOUNTER — Ambulatory Visit (HOSPITAL_COMMUNITY): Payer: Medicaid Other | Admitting: Anesthesiology

## 2022-01-22 ENCOUNTER — Other Ambulatory Visit: Payer: Self-pay

## 2022-01-22 ENCOUNTER — Encounter (HOSPITAL_COMMUNITY)
Admission: RE | Disposition: A | Discharge status: Home / Self Care | Payer: Self-pay | Source: Home / Self Care | Attending: Gastroenterology

## 2022-01-22 ENCOUNTER — Encounter (HOSPITAL_COMMUNITY): Payer: Self-pay | Admitting: Gastroenterology

## 2022-01-22 DIAGNOSIS — Z9013 Acquired absence of bilateral breasts and nipples: Secondary | ICD-10-CM | POA: Insufficient documentation

## 2022-01-22 DIAGNOSIS — C50919 Malignant neoplasm of unspecified site of unspecified female breast: Secondary | ICD-10-CM | POA: Diagnosis not present

## 2022-01-22 DIAGNOSIS — F17210 Nicotine dependence, cigarettes, uncomplicated: Secondary | ICD-10-CM | POA: Insufficient documentation

## 2022-01-22 DIAGNOSIS — K3189 Other diseases of stomach and duodenum: Secondary | ICD-10-CM

## 2022-01-22 DIAGNOSIS — Z79899 Other long term (current) drug therapy: Secondary | ICD-10-CM | POA: Diagnosis not present

## 2022-01-22 DIAGNOSIS — R519 Headache, unspecified: Secondary | ICD-10-CM | POA: Insufficient documentation

## 2022-01-22 DIAGNOSIS — J45909 Unspecified asthma, uncomplicated: Secondary | ICD-10-CM | POA: Insufficient documentation

## 2022-01-22 DIAGNOSIS — R1310 Dysphagia, unspecified: Secondary | ICD-10-CM | POA: Diagnosis not present

## 2022-01-22 DIAGNOSIS — K319 Disease of stomach and duodenum, unspecified: Secondary | ICD-10-CM | POA: Diagnosis not present

## 2022-01-22 DIAGNOSIS — I10 Essential (primary) hypertension: Secondary | ICD-10-CM | POA: Diagnosis not present

## 2022-01-22 DIAGNOSIS — K219 Gastro-esophageal reflux disease without esophagitis: Secondary | ICD-10-CM | POA: Insufficient documentation

## 2022-01-22 DIAGNOSIS — K449 Diaphragmatic hernia without obstruction or gangrene: Secondary | ICD-10-CM | POA: Diagnosis not present

## 2022-01-22 HISTORY — PX: ESOPHAGOGASTRODUODENOSCOPY (EGD) WITH PROPOFOL: SHX5813

## 2022-01-22 HISTORY — PX: BIOPSY: SHX5522

## 2022-01-22 SURGERY — ESOPHAGOGASTRODUODENOSCOPY (EGD) WITH PROPOFOL
Anesthesia: General

## 2022-01-22 MED ORDER — PROPOFOL 10 MG/ML IV BOLUS
INTRAVENOUS | Status: DC | PRN
Start: 2022-01-22 — End: 2022-01-22
  Administered 2022-01-22: 150 mg via INTRAVENOUS
  Administered 2022-01-22 (×2): 100 mg via INTRAVENOUS

## 2022-01-22 MED ORDER — LACTATED RINGERS IV SOLN: INTRAVENOUS | Status: DC

## 2022-01-22 MED ORDER — LIDOCAINE HCL (CARDIAC) PF 50 MG/5ML IV SOSY
PREFILLED_SYRINGE | INTRAVENOUS | Status: DC | PRN
  Administered 2022-01-22: 50 mg via INTRAVENOUS

## 2022-01-22 NOTE — Transfer of Care (Addendum)
Immediate Anesthesia Transfer of Care Note  Patient: [name]  Procedure(s) Performed: ESOPHAGOGASTRODUODENOSCOPY (EGD) WITH PROPOFOL BIOPSY  Patient Location: Short Stay  Anesthesia Type:General  Level of Consciousness: awake and patient cooperative  Airway & Oxygen Therapy: Patient Spontanous Breathing  Post-op Assessment: Report given to RN and Post -op Vital signs reviewed and stable  Post vital signs: Reviewed and stable  Last Vitals:  Vitals Value Taken Time  BP 91/55 01824/2023 1355  Temp 36.8 C 01/22/22 1354  Pulse 84 01/22/22 1354  Resp 18 01/22/22 1354  SpO2 98 % 01/22/22 1354    Last Pain:  Vitals:   01/22/22 1354  TempSrc: Oral  PainSc: 0-No pain      Patients Stated Pain Goal: 5 (15/52/08 0223)  Complications: No notable events documented.

## 2022-01-22 NOTE — Interval H&P Note (Signed)
History and Physical Interval Note:  01/22/2022 12:06 PM  Wendy Zamora  has presented today for surgery, with the diagnosis of GERD Dysphagia.  The various methods of treatment have been discussed with the patient and family. After consideration of risks, benefits and other options for treatment, the patient has consented to  Procedure(s) with comments: ESOPHAGOGASTRODUODENOSCOPY (EGD) WITH PROPOFOL (N/A) - 205 as a surgical intervention.  The patient's history has been reviewed, patient examined, no change in status, stable for surgery.  I have reviewed the patient's chart and labs.  Questions were answered to the patient's satisfaction.     [name] Mayorga

## 2022-01-22 NOTE — Op Note (Signed)
[facility] Patient Name: Wendy Zamora Procedure Date: 01/22/2022 1:25 PM MRN: [id number] Date of Birth: [birth year] Attending MD: [name] ,  CSN: [id number] Age: 61 Admit Type: Outpatient Procedure:                Upper GI endoscopy Indications:              Dysphagia, Follow-up of gastro-esophageal reflux                            disease Providers:                [name], Janeece Riggers, RN, Hughie Closs, RN Referring MD:              Medicines:                Monitored Anesthesia Care Complications:            No immediate complications. Estimated Blood Loss:     Estimated blood loss: none. Procedure:                Pre-Anesthesia Assessment:                           - Prior to the procedure, a History and Physical                            was performed, and patient medications, allergies                            and sensitivities were reviewed. The patient's                            tolerance of previous anesthesia was reviewed.                           - The risks and benefits of the procedure and the                            sedation options and risks were discussed with the                            patient. All questions were answered and informed                            consent was obtained.                           - ASA Grade Assessment: II - A patient with mild                            systemic disease.                           After obtaining informed consent, the endoscope was                            passed under direct vision. Throughout the  procedure, the patient's blood pressure, pulse, and                            oxygen saturations were monitored continuously. The                            [id number]-[id number] ([id number]) scope was introduced through the                            mouth, and advanced to the second part of duodenum.                            The upper GI endoscopy was accomplished without                             difficulty. The patient tolerated the procedure                            well. Scope In: 1:36:26 PM Scope Out: 1:46:21 PM Total Procedure Duration: 0 hours 9 minutes 55 seconds  Findings:      No endoscopic abnormality was evident in the esophagus to explain the       patient's complaint of dysphagia. A guidewire was placed and the scope       was withdrawn. Dilation was performed with a Savary dilator with mild       resistance at 18 mm. mucosal disruption was seen upon reinspection at       the mid esophagus area. Biopsies were obtained from the proximal and       distal esophagus with cold forceps for histology of suspected       eosinophilic esophagitis.      A 2 cm hiatal hernia was present.      A few localized small erosions with no stigmata of recent bleeding were       found in the gastric antrum. Biopsies were taken with a cold forceps for       Helicobacter pylori testing.      The examined duodenum was normal. Impression:               - No endoscopic esophageal abnormality to explain                            patient's dysphagia. Dilated. Biopsied.                           - 2 cm hiatal hernia.                           - Erosive gastropathy with no stigmata of recent                            bleeding. Biopsied.                           - Normal examined duodenum. Moderate Sedation:      Per Anesthesia Care Recommendation:           - Discharge patient  to home (ambulatory).                           - Resume previous diet.                           - Await pathology results.                           - No Goody/BC powders, high dose aspirin,                            ibuprofen, naproxen, or other non-steroidal                            anti-inflammatory drugs.                           - If persistent dysphagia, may consider repeating                            modified barium swallow. Procedure Code(s):        --- Professional ---                            [phone], Esophagogastroduodenoscopy, flexible,                            transoral; with insertion of guide wire followed by                            passage of dilator(s) through esophagus over guide                            wire                           43239, 59, Esophagogastroduodenoscopy, flexible,                            transoral; with biopsy, single or multiple Diagnosis Code(s):        --- Professional ---                           R13.10, Dysphagia, unspecified                           K44.9, Diaphragmatic hernia without obstruction or                            gangrene                           K31.89, Other diseases of stomach and duodenum                           K21.9, Gastro-esophageal reflux disease without  esophagitis CPT copyright 2019 American Medical Association. All rights reserved. The codes documented in this report are preliminary and upon coder review may  be revised to meet current compliance requirements. [name], MD [name],  01/22/2022 1:54:34 PM This report has been signed electronically. Number of Addenda: 0

## 2022-01-22 NOTE — Anesthesia Procedure Notes (Signed)
Date/Time: 01/22/2022 1:30 AM Performed by: Vista Deck, CRNA Pre-anesthesia Checklist: Patient identified, Emergency Drugs available, Suction available, Timeout performed and Patient being monitored Patient Re-evaluated:Patient Re-evaluated prior to induction Oxygen Delivery Method: Nasal Cannula

## 2022-01-22 NOTE — Anesthesia Preprocedure Evaluation (Signed)
Anesthesia Evaluation  Patient identified by MRN, date of birth, ID band Patient awake    Reviewed: Allergy & Precautions, H&P , NPO status , Patient's Chart, lab work & pertinent test results, reviewed documented beta blocker date and time   Airway Mallampati: II  TM Distance: >3 FB Neck ROM: full    Dental no notable dental hx.    Pulmonary asthma , Current Smoker,    Pulmonary exam normal breath sounds clear to auscultation       Cardiovascular Exercise Tolerance: Good hypertension, negative cardio ROS   Rhythm:regular Rate:Normal     Neuro/Psych  Headaches, negative psych ROS   GI/Hepatic Neg liver ROS, GERD  Medicated,  Endo/Other  negative endocrine ROS  Renal/GU negative Renal ROS  negative genitourinary   Musculoskeletal   Abdominal   Peds  Hematology negative hematology ROS (+)   Anesthesia Other Findings   Reproductive/Obstetrics negative OB ROS                             Anesthesia Physical Anesthesia Plan  ASA: 3  Anesthesia Plan: General   Post-op Pain Management:    Induction:   PONV Risk Score and Plan: Propofol infusion  Airway Management Planned:   Additional Equipment:   Intra-op Plan:   Post-operative Plan:   Informed Consent: I have reviewed the patients History and Physical, chart, labs and discussed the procedure including the risks, benefits and alternatives for the proposed anesthesia with the patient or authorized representative who has indicated his/her understanding and acceptance.     Dental Advisory Given  Plan Discussed with: CRNA  Anesthesia Plan Comments:         Anesthesia Quick Evaluation

## 2022-01-22 NOTE — Discharge Instructions (Addendum)
You are being discharged to home.  Resume your previous diet.  We are waiting for your pathology results.  Do not take any  Goody/BC powders, high dose aspirin ibuprofen (including Advil, Motrin or Nuprin), naproxen, or other non-steroidal anti-inflammatory drugs.  If persistent dysphagia, may consider repeating modified barium swallow.

## 2022-01-23 NOTE — Anesthesia Postprocedure Evaluation (Signed)
Anesthesia Post Note  Patient: Wendy Zamora  Procedure(s) Performed: ESOPHAGOGASTRODUODENOSCOPY (EGD) WITH PROPOFOL BIOPSY  Patient location during evaluation: Phase II Anesthesia Type: General Level of consciousness: awake Pain management: pain level controlled Vital Signs Assessment: post-procedure vital signs reviewed and stable Respiratory status: spontaneous breathing and respiratory function stable Cardiovascular status: blood pressure returned to baseline and stable Postop Assessment: no headache and no apparent nausea or vomiting Anesthetic complications: no Comments: Late entry   No notable events documented.   Last Vitals:  Vitals:   01/22/22 1354 01/22/22 1355  BP:  (!) 91/55  Pulse: 84   Resp: 18   Temp: 36.8 C   SpO2: 98%     Last Pain:  Vitals:   01/22/22 1354  TempSrc: Oral  PainSc: 0-No pain                 Louann Sjogren

## 2022-01-24 LAB — SURGICAL PATHOLOGY

## 2022-01-25 ENCOUNTER — Other Ambulatory Visit (HOSPITAL_COMMUNITY): Payer: Self-pay

## 2022-01-25 ENCOUNTER — Encounter (HOSPITAL_COMMUNITY): Payer: Self-pay | Admitting: Gastroenterology

## 2022-01-25 DIAGNOSIS — E538 Deficiency of other specified B group vitamins: Secondary | ICD-10-CM

## 2022-01-25 DIAGNOSIS — C50919 Malignant neoplasm of unspecified site of unspecified female breast: Secondary | ICD-10-CM

## 2022-01-28 ENCOUNTER — Inpatient Hospital Stay (HOSPITAL_COMMUNITY): Payer: Medicaid Other

## 2022-01-28 DIAGNOSIS — Z170 Estrogen receptor positive status [ER+]: Secondary | ICD-10-CM | POA: Diagnosis not present

## 2022-01-28 DIAGNOSIS — Z79899 Other long term (current) drug therapy: Secondary | ICD-10-CM | POA: Diagnosis not present

## 2022-01-28 DIAGNOSIS — M898X9 Other specified disorders of bone, unspecified site: Secondary | ICD-10-CM | POA: Diagnosis not present

## 2022-01-28 DIAGNOSIS — C50912 Malignant neoplasm of unspecified site of left female breast: Secondary | ICD-10-CM | POA: Diagnosis not present

## 2022-01-28 DIAGNOSIS — C50919 Malignant neoplasm of unspecified site of unspecified female breast: Secondary | ICD-10-CM

## 2022-01-28 DIAGNOSIS — E538 Deficiency of other specified B group vitamins: Secondary | ICD-10-CM

## 2022-01-28 DIAGNOSIS — Z5111 Encounter for antineoplastic chemotherapy: Secondary | ICD-10-CM | POA: Diagnosis not present

## 2022-01-28 DIAGNOSIS — C7951 Secondary malignant neoplasm of bone: Secondary | ICD-10-CM | POA: Diagnosis not present

## 2022-01-28 DIAGNOSIS — R1310 Dysphagia, unspecified: Secondary | ICD-10-CM | POA: Diagnosis not present

## 2022-01-31 ENCOUNTER — Inpatient Hospital Stay (HOSPITAL_COMMUNITY): Payer: Medicaid Other | Attending: Hematology | Admitting: Hematology

## 2022-01-31 ENCOUNTER — Other Ambulatory Visit: Payer: Self-pay

## 2022-01-31 ENCOUNTER — Inpatient Hospital Stay (HOSPITAL_COMMUNITY): Payer: Medicaid Other

## 2022-01-31 ENCOUNTER — Encounter (HOSPITAL_COMMUNITY): Payer: Self-pay | Admitting: Hematology

## 2022-01-31 VITALS — BP 139/89 | HR 93 | Temp 98.1°F | Resp 18 | Ht 63.5 in | Wt 178.2 lb

## 2022-01-31 DIAGNOSIS — Z7982 Long term (current) use of aspirin: Secondary | ICD-10-CM | POA: Insufficient documentation

## 2022-01-31 DIAGNOSIS — C50919 Malignant neoplasm of unspecified site of unspecified female breast: Secondary | ICD-10-CM

## 2022-01-31 DIAGNOSIS — E538 Deficiency of other specified B group vitamins: Secondary | ICD-10-CM

## 2022-01-31 DIAGNOSIS — C7951 Secondary malignant neoplasm of bone: Secondary | ICD-10-CM | POA: Insufficient documentation

## 2022-01-31 DIAGNOSIS — Z170 Estrogen receptor positive status [ER+]: Secondary | ICD-10-CM | POA: Insufficient documentation

## 2022-01-31 DIAGNOSIS — Z87891 Personal history of nicotine dependence: Secondary | ICD-10-CM | POA: Insufficient documentation

## 2022-01-31 DIAGNOSIS — I10 Essential (primary) hypertension: Secondary | ICD-10-CM | POA: Insufficient documentation

## 2022-01-31 DIAGNOSIS — M8580 Other specified disorders of bone density and structure, unspecified site: Secondary | ICD-10-CM | POA: Insufficient documentation

## 2022-01-31 DIAGNOSIS — Z79899 Other long term (current) drug therapy: Secondary | ICD-10-CM | POA: Insufficient documentation

## 2022-01-31 DIAGNOSIS — Z5111 Encounter for antineoplastic chemotherapy: Secondary | ICD-10-CM | POA: Diagnosis not present

## 2022-01-31 MED ORDER — FULVESTRANT 250 MG/5ML IM SOSY
500.0000 mg | PREFILLED_SYRINGE | Freq: Once | INTRAMUSCULAR | Status: AC
  Administered 2022-01-31: 500 mg via INTRAMUSCULAR
  Filled 2022-01-31: qty 10

## 2022-01-31 MED ORDER — DENOSUMAB 120 MG/1.7ML ~~LOC~~ SOLN
120.0000 mg | Freq: Once | SUBCUTANEOUS | Status: AC
  Administered 2022-01-31: 120 mg via SUBCUTANEOUS

## 2022-01-31 NOTE — Progress Notes (Signed)
Patient is taking Ibrance as prescribed. Patient has not missed any doses and reports no side effects at this time.

## 2022-01-31 NOTE — Progress Notes (Signed)
Wendy Zamora, [place] [address]   CLINIC:  Medical Oncology/Hematology  PCP:  Renee Rival, FNP [address] [place] / [place] [address] [phone]   REASON FOR VISIT:  Follow-up for metastatic breast cancer  PRIOR THERAPY: none  NGS Results: not done  CURRENT THERAPY: Palbociclib + Fulvestrant every 4 weeks  BRIEF ONCOLOGIC HISTORY:  Oncology History  Metastatic breast cancer ([place])  07/06/2021 Initial Diagnosis   Metastatic breast cancer ([place])   07/18/2021 -  Chemotherapy   Patient is on Treatment Plan : BREAST Palbociclib + Fulvestrant q28d       CANCER STAGING:  Cancer Staging  No matching staging information was found for the patient.  INTERVAL HISTORY:  [name], a 61 y.o. female, returns for routine follow-up and consideration for next cycle of chemotherapy. Wendy Zamora was last seen on 12/24/2021.  Due for cycle #8 of  Palbociclib + Fulvestrant today.   Overall, she tells me she has been feeling pretty well. She reports intermittent hot flashes which are tolerable without medication. She reports knots on her lower back which are sore. She denies jaw pain.  She reports tenderness in her left chest.  Overall, she feels ready for next cycle of chemo today.    REVIEW OF SYSTEMS:  Review of Systems  Constitutional:  Negative for appetite change and fatigue.  Endocrine: Positive for hot flashes (occasional).  Musculoskeletal:  Positive for back pain (5/10 chronic).  All other systems reviewed and are negative.  PAST MEDICAL/SURGICAL HISTORY:  Past Medical History:  Diagnosis Date   Allergy    Asthma    Asthma    diagnosed age 78   Bone metastasis ([place]) 08/2020   was on prolia   Breast cancer ([place])    started chemo 05/05/2019. finished 08/2019   Gangrene ([place])    GERD (gastroesophageal reflux disease)    Hypertension    Hypertension    started bp meds 2008   Lymphedema of arm    left  arm   Osteopenia    Shingles 04/20/2020   left side   Past Surgical History:  Procedure Laterality Date   BIOPSY  01/22/2022   Procedure: BIOPSY;  Surgeon: Montez Morita, Quillian Quince, MD;  [facility];  Service: Gastroenterology;;   Wilmon Pali RELEASE Bilateral 2006   ESOPHAGOGASTRODUODENOSCOPY (EGD) WITH PROPOFOL N/A 01/22/2022   Procedure: ESOPHAGOGASTRODUODENOSCOPY (EGD) WITH PROPOFOL;  Surgeon: Harvel Quale, MD;  Location: [facility];  Service: Gastroenterology;  Laterality: N/A;  205   hysteroscopy     age 72   mastectomy Bilateral 10/22/2019   pt had necrosis after 1st surgery, had another surgery to remove implants 12/10/2019   MOUTH SURGERY     TUBAL LIGATION     TUBAL LIGATION Bilateral 1998   WISDOM TOOTH EXTRACTION     early 30's    SOCIAL HISTORY:  Social History   Socioeconomic History   Marital status: Married    Spouse name: Not on file   Number of children: 2   Years of education: Not on file   Highest education level: Not on file  Occupational History   Occupation: Disability  Tobacco Use   Smoking status: Every Day    Packs/day: 0.50    Years: 40.00    Pack years: 20.00    Types: Cigarettes   Smokeless tobacco: Never   Tobacco comments:    Smokes half a pack a day for  40 years.   Vaping Use   Vaping Use: Never used  Substance and Sexual Activity   Alcohol use: Never   Drug use: Never   Sexual activity: Yes  Other Topics Concern   Not on file  Social History Narrative   ** Merged History Encounter **       Lives with her husband.    Social Determinants of Health   Financial Resource Strain: Low Risk    Difficulty of Paying Living Expenses: Not hard at all  Food Insecurity: No Food Insecurity   Worried About Charity fundraiser in the Last Year: Never true   [place] in the Last Year: Never true  Transportation Needs: No Transportation Needs   Lack of Transportation (Medical): No   Lack of  Transportation (Non-Medical): No  Physical Activity: Insufficiently Active   Days of Exercise per Week: 5 days   Minutes of Exercise per Session: 10 min  Stress: No Stress Concern Present   Feeling of Stress : Only a little  Social Connections: Moderately Isolated   Frequency of Communication with Friends and Family: Three times a week   Frequency of Social Gatherings with Friends and Family: Once a week   Attends Religious Services: Never   Marine scientist or Organizations: No   Attends Music therapist: Never   Marital Status: Married  Human resources officer Violence: Not At Risk   Fear of Current or Ex-Partner: No   Emotionally Abused: No   Physically Abused: No   Sexually Abused: No    FAMILY HISTORY:  Family History  Problem Relation Age of Onset   Leukemia Mother    Breast cancer Sister    Diabetes Maternal Uncle    Hyperlipidemia Maternal Uncle    Diabetes Mellitus II Maternal Grandmother    Heart disease Maternal Grandfather    Diabetes Maternal Grandfather    Breast cancer Cousin    Breast cancer Half-Sister    Osteoporosis Half-Sister    Lupus Half-Sister    Lupus Half-Sister    Osteoporosis Half-Sister    Cancer - Lung Neg Hx    Colon cancer Neg Hx     CURRENT MEDICATIONS:  Current Outpatient Medications  Medication Sig Dispense Refill   amLODipine (NORVASC) 5 MG tablet Take 5 mg by mouth daily.     Ascorbic Acid (VITAMIN C) 1000 MG tablet Take 1,000 mg by mouth daily.     aspirin 81 MG EC tablet Take 81 mg by mouth daily.     butalbital-acetaminophen-caffeine (FIORICET) 50-325-40 MG tablet Take 1 tablet by mouth as needed for headache. 30 tablet 5   Calcium-Magnesium-Vitamin D (CALCIUM 1200+D3 PO) Take 1 tablet by mouth daily.     cetirizine-pseudoephedrine (ZYRTEC-D) 5-120 MG tablet Take 1 tablet by mouth daily.     CINNAMON PO Take 1,000 mg by mouth daily.     CINNAMON PO Take 1 tablet by mouth daily. 1,000 daily     cyanocobalamin 1000  MCG tablet Take 1 tablet (1,000 mcg total) by mouth daily. (Patient taking differently: Take 1,000 mcg by mouth every other day.) 30 tablet 6   cyclobenzaprine (FLEXERIL) 10 MG tablet Take 1 tablet (10 mg total) by mouth 2 (two) times daily as needed for muscle spasms. 60 tablet 5   denosumab (XGEVA) 120 MG/1.7ML SOLN injection Inject 120 mg into the skin every 30 (thirty) days.     fulvestrant (FASLODEX) 250 MG/5ML injection Inject 500 mg into the muscle every  30 (thirty) days. One injection each buttock over 1-2 minutes. Warm prior to use.     furosemide (LASIX) 20 MG tablet Take 20 mg by mouth as needed.     HYDROcodone-acetaminophen (NORCO/VICODIN) 5-325 MG tablet Take 1 tablet by mouth daily as needed for moderate pain. 30 tablet 0   IBRANCE 100 MG tablet TAKE 1 TABLET BY MOUTH 1 TIME A DAY ON DAYS 1 TO 21 OF A 28 DAY CYCLE 21 tablet 2   lansoprazole (PREVACID) 30 MG capsule Take 1 capsule (30 mg total) by mouth in the morning and at bedtime. 180 capsule 3   NAPHCON-A 0.025-0.3 % ophthalmic solution Place 2 drops into both eyes in the morning and at bedtime.     PROAIR HFA 108 (90 Base) MCG/ACT inhaler Inhale 2 puffs into the lungs every 4 (four) hours as needed for wheezing or shortness of breath.     vitamin C (ASCORBIC ACID) 500 MG tablet Take 500 mg by mouth daily.     hydrochlorothiazide (MICROZIDE) 12.5 MG capsule Take 12.5 mg by mouth daily. (Patient not taking: Reported on 01/31/2022)     No current facility-administered medications for this visit.    ALLERGIES:  Allergies  Allergen Reactions   Augmentin [Amoxicillin-Pot Clavulanate] Anaphylaxis   Ciprofloxacin Anaphylaxis   Claritin [Loratadine] Anaphylaxis   Gabapentin Anaphylaxis   Keflex [Cephalexin] Anaphylaxis   Levofloxacin Anaphylaxis   Lisinopril Swelling   Lisinopril Anaphylaxis   Metoprolol Swelling   Metoprolol Anaphylaxis   Nexium [Esomeprazole] Anaphylaxis   Sulfa Antibiotics Hives and Swelling   Tetracyclines  & Related Anaphylaxis   Ceftin [Cefuroxime Axetil] Hives   Chromium Other (See Comments)    headache   Oxycodone Itching   Sulfa Antibiotics     Possible Reaction    Latex Rash    itching   Nickel Rash    itching    PHYSICAL EXAM:  Performance status (ECOG): 0 - Asymptomatic  Vitals:   01/31/22 1407  BP: 139/89  Pulse: 93  Resp: 18  Temp: 98.1 F (36.7 C)  SpO2: 98%   Wt Readings from Last 3 Encounters:  01/31/22 178 lb 3.2 oz (80.8 kg)  01/18/22 179 lb (81.2 kg)  01/18/22 179 lb (81.2 kg)   Physical Exam Vitals reviewed.  Constitutional:      Appearance: Normal appearance.  Cardiovascular:     Rate and Rhythm: Normal rate and regular rhythm.     Pulses: Normal pulses.     Heart sounds: Normal heart sounds.  Pulmonary:     Effort: Pulmonary effort is normal.     Breath sounds: Normal breath sounds.  Chest:  Breasts:    Left: No mass.  Neurological:     General: No focal deficit present.     Mental Status: She is alert and oriented to person, place, and time.  Psychiatric:        Mood and Affect: Mood normal.        Behavior: Behavior normal.    LABORATORY DATA:  I have reviewed the labs as listed.  CBC Latest Ref Rng & Units 01/03/2022 12/04/2021 11/08/2021  WBC 4.0 - 10.5 K/uL 3.0(L) 2.4(L) 3.2(L)  Hemoglobin 12.0 - 15.0 g/dL 13.1 13.0 13.2  Hematocrit 36.0 - 46.0 % 38.0 37.1 37.5  Platelets 150 - 400 K/uL 252 230 225   CMP Latest Ref Rng & Units 01/03/2022 12/04/2021 11/08/2021  Glucose 70 - 99 mg/dL 86 116(H) 81  BUN 6 - 20 mg/dL 16 15 11  Creatinine 0.44 - 1.00 mg/dL 0.85 0.73 0.74  Sodium 135 - 145 mmol/L 142 141 139  Potassium 3.5 - 5.1 mmol/L 4.1 3.7 3.7  Chloride 98 - 111 mmol/L 106 108 105  CO2 22 - 32 mmol/L '24 22 23  ' Calcium 8.9 - 10.3 mg/dL 9.4 9.5 9.3  Total Protein 6.5 - 8.1 g/dL 7.7 7.3 7.3  Total Bilirubin 0.3 - 1.2 mg/dL 0.5 0.7 0.5  Alkaline Phos 38 - 126 U/L 66 58 61  AST 15 - 41 U/L '21 22 19  ' ALT 0 - 44 U/L '22 20 18     ' DIAGNOSTIC IMAGING:  I have independently reviewed the scans and discussed with the patient. No results found.   ASSESSMENT:  1.  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 - Started on Ibrance and Faslodex in December 2021 for metastatic disease. - PET scan on 08/10/2021 with no evidence of hypermetabolic metastatic disease.  Widespread scattered sclerotic bone metastasis in the spine and pelvis are stable and not metabolically active.  No lymphadenopathy.  Asymmetric muscular uptake in the left lower scalene muscles and in the intercostal muscles of the ventral upper left chest wall without discrete mass correlate on the CT images, favoring activity related uptake.  2.  Social/family history: - She is a retired Building control surveyor.  She recently moved back from [place]. - Younger sister had breast cancer in her 19s.  Mother died of leukemia at age 58.  Paternal cousin also had breast cancer.  3.  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  She underwent immediate implants which were later removed on 12/10/2019 secondary to infection and necrosis. - XRT to the chest wall and regional lymph nodes, 60 [place], from 02/29/2020 through 04/12/2020 - She was treated with adjuvant anastrozole which was started 12/09/2019 and was found to have metastatic disease in November 2021. - Previous genetic  testing was reportedly negative and [place].   PLAN:  1.  Metastatic breast cancer to the bones, ER/PR positive, HER2 negative: - We have dose reduced Ibrance to 100 mg for fatigue around 10/11/2021. - Since then she has been tolerating it very well. - She has some tenderness over the left chest wall area without any suspicious lesions. - Reviewed labs which showed normal CBC and LFTs.  White count is low with ANC 1.1 secondary to [place]. - CA 15-3 is stable between 29-35.  CA 27-29 has normalized. - Continue Faslodex monthly. - RTC 2 months for follow-up.  We will repeat CT CAP with contrast and whole-body bone scan prior to next visit along with tumor markers.   2.  Peripheral neuropathy: - Tingling and numbness on and off in the hands is stable. - If there is any pains, will consider Lyrica.  3.  Bone metastatic disease: - Continue calcium and vitamin D supplements.  Calcium today is 9.4.  Continue monthly denosumab.  4.  Hot flashes: - She has some hot flashes.  Not  requiring Effexor.  5.  Dysphagia: - She had upper endoscopy on 01/22/2022.  Biopsies showed nonspecific reactive gastropathy.  6.  Body pains: - Continue hydrocodone 5/325 daily as needed and Flexeril as needed.   Orders placed this encounter:  No orders of the defined types were placed in this encounter.    Derek Jack, MD [place] [phone]   I, Thana Ates, am acting as a scribe for Dr. Derek Jack.  I, Derek Jack MD, have reviewed the above documentation for accuracy and completeness, and I agree with the above.

## 2022-01-31 NOTE — Patient Instructions (Signed)
[facility] at [facility] Discharge Instructions  You were seen and examined today by Dr. Delton Coombes. He reviewed your most recent labs and they show that your disease is under control. Your white blood cell count is slightly low due to the Ibrance. Continue taking Ibrance as prescribed. Continue taking the Vitamin B12 every other day. We will get you scheduled for CT chest abdomen and pelvis and whole body bone scan. Please keep follow up appointment as scheduled in 2 months.    Thank you for choosing [place] at [facility] to provide your oncology and hematology care.  To afford each patient quality time with our provider, please arrive at least 15 minutes before your scheduled appointment time.   If you have a lab appointment with the [facility] please come in thru the Main Entrance and check in at the main information desk.  You need to re-schedule your appointment should you arrive 10 or more minutes late.  We strive to give you quality time with our providers, and arriving late affects you and other patients whose appointments are after yours.  Also, if you no show three or more times for appointments you may be dismissed from the clinic at the providers discretion.     Again, thank you for choosing [facility].  Our hope is that these requests will decrease the amount of time that you wait before being seen by our physicians.       _____________________________________________________________  Should you have questions after your visit to [facility], please contact our office at [phone] and follow the prompts.  Our office hours are 8:00 a.m. and 4:30 p.m. Monday - Friday.  Please note that voicemails left after 4:00 p.m. may not be returned until the following business day.  We are closed weekends and major holidays.  You do have access to a nurse 24-7, just call the main number to the clinic [phone] and  do not press any options, hold on the line and a nurse will answer the phone.    For prescription refill requests, have your pharmacy contact our office and allow 72 hours.    Due to Covid, you will need to wear a mask upon entering the hospital. If you do not have a mask, a mask will be given to you at the Main Entrance upon arrival. For doctor visits, patients may have 1 support person age 6 or older with them. For treatment visits, patients can not have anyone with them due to social distancing guidelines and our immunocompromised population.

## 2022-02-04 ENCOUNTER — Encounter (HOSPITAL_COMMUNITY): Payer: Self-pay | Admitting: Hematology and Oncology

## 2022-02-06 ENCOUNTER — Encounter (HOSPITAL_COMMUNITY): Payer: Self-pay | Admitting: Hematology and Oncology

## 2022-02-27 ENCOUNTER — Encounter (HOSPITAL_COMMUNITY): Payer: Self-pay | Admitting: Hematology and Oncology

## 2022-02-27 ENCOUNTER — Other Ambulatory Visit (HOSPITAL_COMMUNITY): Payer: Self-pay

## 2022-02-28 ENCOUNTER — Inpatient Hospital Stay (HOSPITAL_COMMUNITY): Payer: Medicaid Other

## 2022-02-28 ENCOUNTER — Encounter (HOSPITAL_COMMUNITY): Payer: Self-pay

## 2022-02-28 ENCOUNTER — Inpatient Hospital Stay (HOSPITAL_COMMUNITY): Payer: Medicaid Other | Attending: Hematology

## 2022-02-28 ENCOUNTER — Other Ambulatory Visit: Payer: Self-pay

## 2022-02-28 VITALS — BP 114/69 | HR 84 | Temp 96.7°F | Resp 18 | Wt 179.0 lb

## 2022-02-28 DIAGNOSIS — C50919 Malignant neoplasm of unspecified site of unspecified female breast: Secondary | ICD-10-CM

## 2022-02-28 DIAGNOSIS — Z170 Estrogen receptor positive status [ER+]: Secondary | ICD-10-CM | POA: Insufficient documentation

## 2022-02-28 DIAGNOSIS — E538 Deficiency of other specified B group vitamins: Secondary | ICD-10-CM

## 2022-02-28 DIAGNOSIS — Z5111 Encounter for antineoplastic chemotherapy: Secondary | ICD-10-CM | POA: Diagnosis not present

## 2022-02-28 DIAGNOSIS — Z9013 Acquired absence of bilateral breasts and nipples: Secondary | ICD-10-CM | POA: Diagnosis not present

## 2022-02-28 DIAGNOSIS — C7951 Secondary malignant neoplasm of bone: Secondary | ICD-10-CM | POA: Diagnosis not present

## 2022-02-28 LAB — CBC WITH DIFFERENTIAL/PLATELET
Abs Immature Granulocytes: 0 10*3/uL (ref 0.00–0.07)
Basophils Absolute: 0.1 10*3/uL (ref 0.0–0.1)
Basophils Relative: 1 %
Eosinophils Absolute: 0 10*3/uL (ref 0.0–0.5)
Eosinophils Relative: 1 %
HCT: 39.5 % (ref 36.0–46.0)
Hemoglobin: 13.5 g/dL (ref 12.0–15.0)
Immature Granulocytes: 0 %
Lymphocytes Relative: 37 %
Lymphs Abs: 1.3 10*3/uL (ref 0.7–4.0)
MCH: 36.1 pg — ABNORMAL HIGH (ref 26.0–34.0)
MCHC: 34.2 g/dL (ref 30.0–36.0)
MCV: 105.6 fL — ABNORMAL HIGH (ref 80.0–100.0)
Monocytes Absolute: 0.5 10*3/uL (ref 0.1–1.0)
Monocytes Relative: 14 %
Neutro Abs: 1.7 10*3/uL (ref 1.7–7.7)
Neutrophils Relative %: 47 %
Platelets: 257 10*3/uL (ref 150–400)
RBC: 3.74 MIL/uL — ABNORMAL LOW (ref 3.87–5.11)
RDW: 13.2 % (ref 11.5–15.5)
WBC: 3.6 10*3/uL — ABNORMAL LOW (ref 4.0–10.5)
nRBC: 0 % (ref 0.0–0.2)

## 2022-02-28 LAB — COMPREHENSIVE METABOLIC PANEL
ALT: 28 U/L (ref 0–44)
AST: 29 U/L (ref 15–41)
Albumin: 4.1 g/dL (ref 3.5–5.0)
Alkaline Phosphatase: 55 U/L (ref 38–126)
Anion gap: 8 (ref 5–15)
BUN: 17 mg/dL (ref 6–20)
CO2: 23 mmol/L (ref 22–32)
Calcium: 9.7 mg/dL (ref 8.9–10.3)
Chloride: 105 mmol/L (ref 98–111)
Creatinine, Ser: 0.78 mg/dL (ref 0.44–1.00)
GFR, Estimated: >60 mL/min (ref >60)
Glucose, Bld: 114 mg/dL — ABNORMAL HIGH (ref 70–99)
Potassium: 3.4 mmol/L — ABNORMAL LOW (ref 3.5–5.1)
Sodium: 136 mmol/L (ref 135–145)
Total Bilirubin: 0.5 mg/dL (ref 0.3–1.2)
Total Protein: 7.5 g/dL (ref 6.5–8.1)

## 2022-02-28 MED ORDER — FULVESTRANT 250 MG/5ML IM SOSY
500.0000 mg | PREFILLED_SYRINGE | Freq: Once | INTRAMUSCULAR | Status: AC
  Administered 2022-02-28: 500 mg via INTRAMUSCULAR
  Filled 2022-02-28: qty 10

## 2022-02-28 MED ORDER — DENOSUMAB 120 MG/1.7ML ~~LOC~~ SOLN
120.0000 mg | Freq: Once | SUBCUTANEOUS | Status: AC
  Administered 2022-02-28: 120 mg via SUBCUTANEOUS
  Filled 2022-02-28: qty 1.7

## 2022-02-28 NOTE — Patient Instructions (Signed)
Wendy Zamora  Discharge Instructions: ?Thank you for choosing [facility] to provide your oncology and hematology care.  ?If you have a lab appointment with the [facility], please come in thru the Main Entrance and check in at the main information desk. ? ?Wear comfortable clothing and clothing appropriate for easy access to any Portacath or PICC line.  ? ?We strive to give you quality time with your provider. You may need to reschedule your appointment if you arrive late (15 or more minutes).  Arriving late affects you and other patients whose appointments are after yours.  Also, if you miss three or more appointments without notifying the office, you may be dismissed from the clinic at the provider?s discretion.    ?  ?For prescription refill requests, have your pharmacy contact our office and allow 72 hours for refills to be completed.   ? ?Today you received the following Xgeva and Faslodex, return as scheduled. ?  ?To help prevent nausea and vomiting after your treatment, we encourage you to take your nausea medication as directed. ? ?BELOW ARE SYMPTOMS THAT SHOULD BE REPORTED IMMEDIATELY: ?*FEVER GREATER THAN 100.4 F (38 ?C) OR HIGHER ?*CHILLS OR SWEATING ?*NAUSEA AND VOMITING THAT IS NOT CONTROLLED WITH YOUR NAUSEA MEDICATION ?*UNUSUAL SHORTNESS OF BREATH ?*UNUSUAL BRUISING OR BLEEDING ?*URINARY PROBLEMS (pain or burning when urinating, or frequent urination) ?*BOWEL PROBLEMS (unusual diarrhea, constipation, pain near the anus) ?TENDERNESS IN MOUTH AND THROAT WITH OR WITHOUT PRESENCE OF ULCERS (sore throat, sores in mouth, or a toothache) ?UNUSUAL RASH, SWELLING OR PAIN  ?UNUSUAL VAGINAL DISCHARGE OR ITCHING  ? ?Items with * indicate a potential emergency and should be followed up as soon as possible or go to the Emergency Department if any problems should occur. ? ?Please show the CHEMOTHERAPY ALERT CARD or IMMUNOTHERAPY ALERT CARD at check-in to the Emergency Department and  triage nurse. ? ?Should you have questions after your visit or need to cancel or reschedule your appointment, please contact [facility] [phone]  and follow the prompts.  Office hours are 8:00 a.m. to 4:30 p.m. Monday - Friday. Please note that voicemails left after 4:00 p.m. may not be returned until the following business day.  We are closed weekends and major holidays. You have access to a nurse at all times for urgent questions. Please call the main number to the clinic [phone] and follow the prompts. ? ?For any non-urgent questions, you may also contact your provider using MyChart. We now offer e-Visits for anyone 77 and older to request care online for non-urgent symptoms. For details visit [email or web address]. ?  ?Also download the MyChart app! Go to the app store, search "MyChart", open the app, select [facility], and log in with your MyChart username and password. ? ?Due to Covid, a mask is required upon entering the hospital/clinic. If you do not have a mask, one will be given to you upon arrival. For doctor visits, patients may have 1 support person aged 30 or older with them. For treatment visits, patients cannot have anyone with them due to current Covid guidelines and our immunocompromised population.  ?

## 2022-02-28 NOTE — Progress Notes (Signed)
Patient tolerated Faslodex injection with no complaints voiced. Bilateral sites clean and dry with no bruising or swelling noted. Band aids applied. See MAR for details. Patient stable during and after injections.  Patient taking calcium as directed. Denied tooth, jaw, and leg pain. No recent or upcoming dental visits. Labs reviewed. Patient tolerated injection with no complaints voiced. See MAR for details. Patient stable during and after injection. Site clean and dry with no bruising or swelling noted. Band aid applied. Vss with discharge and left in satisfactory condition with no s/s of distress.  

## 2022-03-01 LAB — CANCER ANTIGEN 27.29: CA 27.29: 38.5 U/mL (ref 0.0–38.6)

## 2022-03-01 LAB — CANCER ANTIGEN 15-3: CA 15-3: 29.2 U/mL — ABNORMAL HIGH (ref 0.0–25.0)

## 2022-03-05 NOTE — Progress Notes (Signed)
Has follow up on 03/28/22

## 2022-03-06 ENCOUNTER — Other Ambulatory Visit: Payer: Self-pay | Admitting: Oncology

## 2022-03-21 ENCOUNTER — Encounter (HOSPITAL_COMMUNITY)
Admission: RE | Admit: 2022-03-21 | Discharge: 2022-03-21 | Disposition: A | Discharge status: Home / Self Care | Payer: Medicaid Other | Source: Ambulatory Visit | Attending: Hematology | Admitting: Hematology

## 2022-03-21 ENCOUNTER — Ambulatory Visit (HOSPITAL_COMMUNITY)
Admission: RE | Admit: 2022-03-21 | Discharge: 2022-03-21 | Disposition: A | Discharge status: Home / Self Care | Payer: Medicaid Other | Source: Ambulatory Visit | Attending: Hematology | Admitting: Hematology

## 2022-03-21 ENCOUNTER — Other Ambulatory Visit: Payer: Self-pay

## 2022-03-21 ENCOUNTER — Encounter (HOSPITAL_COMMUNITY): Payer: Self-pay

## 2022-03-21 DIAGNOSIS — C50919 Malignant neoplasm of unspecified site of unspecified female breast: Secondary | ICD-10-CM | POA: Diagnosis not present

## 2022-03-21 DIAGNOSIS — I700 Atherosclerosis of aorta: Secondary | ICD-10-CM | POA: Diagnosis not present

## 2022-03-21 DIAGNOSIS — E538 Deficiency of other specified B group vitamins: Secondary | ICD-10-CM | POA: Insufficient documentation

## 2022-03-21 DIAGNOSIS — C7951 Secondary malignant neoplasm of bone: Secondary | ICD-10-CM | POA: Diagnosis not present

## 2022-03-21 DIAGNOSIS — J432 Centrilobular emphysema: Secondary | ICD-10-CM | POA: Diagnosis not present

## 2022-03-21 DIAGNOSIS — C50912 Malignant neoplasm of unspecified site of left female breast: Secondary | ICD-10-CM | POA: Diagnosis not present

## 2022-03-21 DIAGNOSIS — Z853 Personal history of malignant neoplasm of breast: Secondary | ICD-10-CM | POA: Diagnosis not present

## 2022-03-21 MED ORDER — IOHEXOL 300 MG/ML  SOLN
75.0000 mL | Freq: Once | INTRAMUSCULAR | Status: AC | PRN
  Administered 2022-03-21: 75 mL via INTRAVENOUS

## 2022-03-21 MED ORDER — TECHNETIUM TC 99M MEDRONATE IV KIT
20.0000 | PACK | Freq: Once | INTRAVENOUS | Status: AC | PRN
  Administered 2022-03-21: 21 via INTRAVENOUS

## 2022-03-28 ENCOUNTER — Inpatient Hospital Stay (HOSPITAL_COMMUNITY): Payer: Medicaid Other | Admitting: Hematology

## 2022-03-28 ENCOUNTER — Inpatient Hospital Stay (HOSPITAL_COMMUNITY): Payer: Medicaid Other

## 2022-03-28 VITALS — BP 124/83 | HR 89 | Temp 97.7°F | Resp 18 | Ht 63.5 in | Wt 178.3 lb

## 2022-03-28 DIAGNOSIS — Z9013 Acquired absence of bilateral breasts and nipples: Secondary | ICD-10-CM | POA: Diagnosis not present

## 2022-03-28 DIAGNOSIS — C50919 Malignant neoplasm of unspecified site of unspecified female breast: Secondary | ICD-10-CM

## 2022-03-28 DIAGNOSIS — C7951 Secondary malignant neoplasm of bone: Secondary | ICD-10-CM | POA: Diagnosis not present

## 2022-03-28 DIAGNOSIS — Z5111 Encounter for antineoplastic chemotherapy: Secondary | ICD-10-CM | POA: Diagnosis not present

## 2022-03-28 DIAGNOSIS — Z170 Estrogen receptor positive status [ER+]: Secondary | ICD-10-CM | POA: Diagnosis not present

## 2022-03-28 LAB — COMPREHENSIVE METABOLIC PANEL
ALT: 27 U/L (ref 0–44)
AST: 24 U/L (ref 15–41)
Albumin: 4.4 g/dL (ref 3.5–5.0)
Alkaline Phosphatase: 59 U/L (ref 38–126)
Anion gap: 11 (ref 5–15)
BUN: 21 mg/dL — ABNORMAL HIGH (ref 6–20)
CO2: 22 mmol/L (ref 22–32)
Calcium: 10.1 mg/dL (ref 8.9–10.3)
Chloride: 106 mmol/L (ref 98–111)
Creatinine, Ser: 0.71 mg/dL (ref 0.44–1.00)
GFR, Estimated: >60 mL/min (ref >60)
Glucose, Bld: 101 mg/dL — ABNORMAL HIGH (ref 70–99)
Potassium: 3.8 mmol/L (ref 3.5–5.1)
Sodium: 139 mmol/L (ref 135–145)
Total Bilirubin: 0.5 mg/dL (ref 0.3–1.2)
Total Protein: 8 g/dL (ref 6.5–8.1)

## 2022-03-28 LAB — CBC WITH DIFFERENTIAL/PLATELET
Abs Immature Granulocytes: 0.01 10*3/uL (ref 0.00–0.07)
Basophils Absolute: 0.1 10*3/uL (ref 0.0–0.1)
Basophils Relative: 1 %
Eosinophils Absolute: 0 10*3/uL (ref 0.0–0.5)
Eosinophils Relative: 1 %
HCT: 40.5 % (ref 36.0–46.0)
Hemoglobin: 13.9 g/dL (ref 12.0–15.0)
Immature Granulocytes: 0 %
Lymphocytes Relative: 39 %
Lymphs Abs: 1.5 10*3/uL (ref 0.7–4.0)
MCH: 35.6 pg — ABNORMAL HIGH (ref 26.0–34.0)
MCHC: 34.3 g/dL (ref 30.0–36.0)
MCV: 103.8 fL — ABNORMAL HIGH (ref 80.0–100.0)
Monocytes Absolute: 0.6 10*3/uL (ref 0.1–1.0)
Monocytes Relative: 16 %
Neutro Abs: 1.6 10*3/uL — ABNORMAL LOW (ref 1.7–7.7)
Neutrophils Relative %: 43 %
Platelets: 274 10*3/uL (ref 150–400)
RBC: 3.9 MIL/uL (ref 3.87–5.11)
RDW: 13.2 % (ref 11.5–15.5)
WBC: 3.8 10*3/uL — ABNORMAL LOW (ref 4.0–10.5)
nRBC: 0 % (ref 0.0–0.2)

## 2022-03-28 MED ORDER — PALBOCICLIB 100 MG PO TABS: ORAL_TABLET | ORAL | 4 refills | Status: DC

## 2022-03-28 MED ORDER — FULVESTRANT 250 MG/5ML IM SOSY
500.0000 mg | PREFILLED_SYRINGE | Freq: Once | INTRAMUSCULAR | Status: AC
  Administered 2022-03-28: 500 mg via INTRAMUSCULAR
  Filled 2022-03-28: qty 10

## 2022-03-28 MED ORDER — DENOSUMAB 120 MG/1.7ML ~~LOC~~ SOLN
120.0000 mg | Freq: Once | SUBCUTANEOUS | Status: AC
  Administered 2022-03-28: 120 mg via SUBCUTANEOUS
  Filled 2022-03-28: qty 1.7

## 2022-03-28 NOTE — Progress Notes (Signed)
Injections  given per orders. Patient tolerated it well without problems. Vitals stable and discharged home from clinic ambulatory. Follow up as scheduled. ? ?

## 2022-03-28 NOTE — Patient Instructions (Signed)
[place] at [facility] ?Discharge Instructions ? ?You were seen and examined today by Dr. Delton Coombes. He reviewed your most recent labs and everything looks good. Please keep follow up as scheduled in 3 months. ? ? ?Thank you for choosing [place] at [facility] to provide your oncology and hematology care.  To afford each patient quality time with our provider, please arrive at least 15 minutes before your scheduled appointment time.  ? ?If you have a lab appointment with the [facility] please come in thru the Main Entrance and check in at the main information desk. ? ?You need to re-schedule your appointment should you arrive 10 or more minutes late.  We strive to give you quality time with our providers, and arriving late affects you and other patients whose appointments are after yours.  Also, if you no show three or more times for appointments you may be dismissed from the clinic at the providers discretion.     ?Again, thank you for choosing [facility].  Our hope is that these requests will decrease the amount of time that you wait before being seen by our physicians.       ?_____________________________________________________________ ? ?Should you have questions after your visit to [facility], please contact our office at [phone] and follow the prompts.  Our office hours are 8:00 a.m. and 4:30 p.m. Monday - Friday.  Please note that voicemails left after 4:00 p.m. may not be returned until the following business day.  We are closed weekends and major holidays.  You do have access to a nurse 24-7, just call the main number to the clinic [phone] and do not press any options, hold on the line and a nurse will answer the phone.   ? ?For prescription refill requests, have your pharmacy contact our office and allow 72 hours.   ? ?Due to Covid, you will need to wear a mask upon entering the hospital. If you do not have a  mask, a mask will be given to you at the Main Entrance upon arrival. For doctor visits, patients may have 1 support person age 14 or older with them. For treatment visits, patients can not have anyone with them due to social distancing guidelines and our immunocompromised population.  ? ?  ?

## 2022-03-28 NOTE — Patient Instructions (Signed)
[facility]  Discharge Instructions: ?Thank you for choosing [facility] to provide your oncology and hematology care.  ?If you have a lab appointment with the Cancer Center, please come in thru the Main Entrance and check in at the main information desk. ? ? ? ?We strive to give you quality time with your provider. You may need to reschedule your appointment if you arrive late (15 or more minutes).  Arriving late affects you and other patients whose appointments are after yours.  Also, if you miss three or more appointments without notifying the office, you may be dismissed from the clinic at the provider?s discretion.    ?  ?For prescription refill requests, have your pharmacy contact our office and allow 72 hours for refills to be completed.   ? ?  ?To help prevent nausea and vomiting after your treatment, we encourage you to take your nausea medication as directed. ? ?BELOW ARE SYMPTOMS THAT SHOULD BE REPORTED IMMEDIATELY: ?*FEVER GREATER THAN 100.4 F (38 ?C) OR HIGHER ?*CHILLS OR SWEATING ?*NAUSEA AND VOMITING THAT IS NOT CONTROLLED WITH YOUR NAUSEA MEDICATION ?*UNUSUAL SHORTNESS OF BREATH ?*UNUSUAL BRUISING OR BLEEDING ?*URINARY PROBLEMS (pain or burning when urinating, or frequent urination) ?*BOWEL PROBLEMS (unusual diarrhea, constipation, pain near the anus) ?TENDERNESS IN MOUTH AND THROAT WITH OR WITHOUT PRESENCE OF ULCERS (sore throat, sores in mouth, or a toothache) ?UNUSUAL RASH, SWELLING OR PAIN  ?UNUSUAL VAGINAL DISCHARGE OR ITCHING  ? ?Items with * indicate a potential emergency and should be followed up as soon as possible or go to the Emergency Department if any problems should occur. ? ?Please show the CHEMOTHERAPY ALERT CARD or IMMUNOTHERAPY ALERT CARD at check-in to the Emergency Department and triage nurse. ? ?Should you have questions after your visit or need to cancel or reschedule your appointment, please contact [facility] [phone]  and follow  the prompts.  Office hours are 8:00 a.m. to 4:30 p.m. Monday - Friday. Please note that voicemails left after 4:00 p.m. may not be returned until the following business day.  We are closed weekends and major holidays. You have access to a nurse at all times for urgent questions. Please call the main number to the clinic [phone] and follow the prompts. ? ?For any non-urgent questions, you may also contact your provider using MyChart. We now offer e-Visits for anyone 18 and older to request care online for non-urgent symptoms. For details visit [email or web address]. ?  ?Also download the MyChart app! Go to the app store, search "MyChart", open the app, select [facility], and log in with your MyChart username and password. ? ?Due to Covid, a mask is required upon entering the hospital/clinic. If you do not have a mask, one will be given to you upon arrival. For doctor visits, patients may have 1 support person aged 18 or older with them. For treatment visits, patients cannot have anyone with them due to current Covid guidelines and our immunocompromised population.  ?

## 2022-03-28 NOTE — Progress Notes (Signed)
? ?[name] ?[address] ?[place], [place] [address] ? ? ?CLINIC:  ?Medical Oncology/Hematology ? ?PCP:  ?Renee Rival, FNP ?[address] / [place] [address] ?[phone] ? ? ?REASON FOR VISIT:  ?Follow-up for metastatic breast cancer ? ?PRIOR THERAPY: none ? ?NGS Results: not done ? ?CURRENT THERAPY: Palbociclib + Fulvestrant every 4 weeks ? ?BRIEF ONCOLOGIC HISTORY:  ?Oncology History  ?Metastatic breast cancer ([place])  ?07/06/2021 Initial Diagnosis  ? Metastatic breast cancer ([place]) ?  ?07/18/2021 -  Chemotherapy  ? Patient is on Treatment Plan : BREAST Palbociclib + Fulvestrant q28d  ?   ? ? ?CANCER STAGING: ? Cancer Staging  ?No matching staging information was found for the patient. ? ?INTERVAL HISTORY:  ?[name], a 61 y.o. female, returns for routine follow-up and consideration for next cycle of chemotherapy. Ople was last seen on 01/31/2022. ? ?Due for cycle #10 of Palbociclib + Fulvestrant today.  ? ?Overall, she tells me she has been feeling pretty well. She reports a rash on her right arm which appeared after being given CT IV contrast which has not been improved with Benadryl or cortisone cream. 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 She is taking calcium and vitamin D.  ? ?Overall, she feels ready for next cycle of chemo today.  ? ? ?REVIEW OF SYSTEMS:  ?Review of Systems  ?Constitutional:  Negative for appetite change and fatigue.  ?HENT:   Positive for trouble swallowing.   ?Respiratory:  Positive for shortness of breath.   ?Cardiovascular:  Positive for chest pain (6/10 chronic).  ?Gastrointestinal:  Positive for constipation.  ?Skin:  Positive for rash (R arm).  ?Neurological:  Positive for numbness.  ?Psychiatric/Behavioral:  Positive for sleep disturbance.   ?All other systems reviewed and are negative. ? ?PAST MEDICAL/SURGICAL HISTORY:  ?Past Medical History:   ?Diagnosis Date  ? Allergy   ? Asthma   ? Asthma   ? diagnosed age 3  ? Bone metastasis ([place]) 08/2020  ? was on prolia  ? Breast cancer ([place])   ? started chemo 05/05/2019. finished 08/2019  ? Gangrene ([place])   ? GERD (gastroesophageal reflux disease)   ? Hypertension   ? Hypertension   ? started bp meds 2008  ? Lymphedema of arm   ? left arm  ? Osteopenia   ? Shingles 04/20/2020  ? left side  ? ?Past Surgical History:  ?Procedure Laterality Date  ? BIOPSY  01/22/2022  ? Procedure: BIOPSY;  Surgeon: Montez Morita, Quillian Quince, MD;  [facility];  Service: Gastroenterology;;  ? CARPAL TUNNEL RELEASE Bilateral 2006  ? ESOPHAGOGASTRODUODENOSCOPY (EGD) WITH PROPOFOL N/A 01/22/2022  ? Procedure: ESOPHAGOGASTRODUODENOSCOPY (EGD) WITH PROPOFOL;  Surgeon: Harvel Quale, MD;  [facility]: [facility];  Service: Gastroenterology;  Laterality: N/A;  205  ? hysteroscopy    ? age 21  ? mastectomy Bilateral 10/22/2019  ? pt had necrosis after 1st surgery, had another surgery to remove implants 12/10/2019  ? MOUTH SURGERY    ? TUBAL LIGATION    ? TUBAL LIGATION Bilateral 1998  ? WISDOM TOOTH EXTRACTION    ? early 27's  ? ? ?SOCIAL HISTORY:  ?Social History  ? ?Socioeconomic History  ? Marital status: Married  ?  Spouse name: Not on file  ? Number of children: 2  ? Years of education: Not on file  ? Highest  education level: Not on file  ?Occupational History  ? Occupation: Disability  ?Tobacco Use  ? Smoking status: Every Day  ?  Packs/day: 0.50  ?  Years: 40.00  ?  Pack years: 20.00  ?  Types: Cigarettes  ? Smokeless tobacco: Never  ? Tobacco comments:  ?  Smokes half a pack a day for 40 years.   ?Vaping Use  ? Vaping Use: Never used  ?Substance and Sexual Activity  ? Alcohol use: Never  ? Drug use: Never  ? Sexual activity: Yes  ?Other Topics Concern  ? Not on file  ?Social History Narrative  ? ** Merged History Encounter **  ?    ? Lives with her husband.   ? ?Social Determinants of Health  ? ?Financial Resource  Strain: Low Risk   ? Difficulty of Paying Living Expenses: Not hard at all  ?Food Insecurity: No Food Insecurity  ? Worried About Charity fundraiser in the Last Year: Never true  ? Ran Out of Food in the Last Year: Never true  ?Transportation Needs: No Transportation Needs  ? Lack of Transportation (Medical): No  ? Lack of Transportation (Non-Medical): No  ?Physical Activity: Insufficiently Active  ? Days of Exercise per Week: 5 days  ? Minutes of Exercise per Session: 10 min  ?Stress: No Stress Concern Present  ? Feeling of Stress : Only a little  ?Social Connections: Moderately Isolated  ? Frequency of Communication with Friends and Family: Three times a week  ? Frequency of Social Gatherings with Friends and Family: Once a week  ? Attends Religious Services: Never  ? Active Member of Clubs or Organizations: No  ? Attends Archivist Meetings: Never  ? Marital Status: Married  ?Intimate Partner Violence: Not At Risk  ? Fear of Current or Ex-Partner: No  ? Emotionally Abused: No  ? Physically Abused: No  ? Sexually Abused: No  ? ? ?FAMILY HISTORY:  ?Family History  ?Problem Relation Age of Onset  ? Leukemia Mother   ? Breast cancer Sister   ? Diabetes Maternal Uncle   ? Hyperlipidemia Maternal Uncle   ? Diabetes Mellitus II Maternal Grandmother   ? Heart disease Maternal Grandfather   ? Diabetes Maternal Grandfather   ? Breast cancer Cousin   ? Breast cancer Half-Sister   ? Osteoporosis Half-Sister   ? Lupus Half-Sister   ? Lupus Half-Sister   ? Osteoporosis Half-Sister   ? Cancer - Lung Neg Hx   ? Colon cancer Neg Hx   ? ? ?CURRENT MEDICATIONS:  ?Current Outpatient Medications  ?Medication Sig Dispense Refill  ? amLODipine (NORVASC) 5 MG tablet Take 5 mg by mouth daily.    ? Ascorbic Acid (VITAMIN C) 1000 MG tablet Take 1,000 mg by mouth daily.    ? aspirin 81 MG EC tablet Take 81 mg by mouth daily.    ? butalbital-acetaminophen-caffeine (FIORICET) 50-325-40 MG tablet Take 1 tablet by mouth as needed for  headache. 30 tablet 5  ? Calcium-Magnesium-Vitamin D (CALCIUM 1200+D3 PO) Take 1 tablet by mouth daily.    ? cetirizine-pseudoephedrine (ZYRTEC-D) 5-120 MG tablet Take 1 tablet by mouth daily.    ? CINNAMON PO Take 1,000 mg by mouth daily.    ? CINNAMON PO Take 1 tablet by mouth daily. 1,000 daily    ? cyanocobalamin 1000 MCG tablet Take 1 tablet (1,000 mcg total) by mouth daily. (Patient taking differently: Take 1,000 mcg by mouth every other day.) 30 tablet 6  ?  cyclobenzaprine (FLEXERIL) 10 MG tablet Take 1 tablet (10 mg total) by mouth 2 (two) times daily as needed for muscle spasms. 60 tablet 5  ? denosumab (XGEVA) 120 MG/1.7ML SOLN injection Inject 120 mg into the skin every 30 (thirty) days.    ? fulvestrant (FASLODEX) 250 MG/5ML injection Inject 500 mg into the muscle every 30 (thirty) days. One injection each buttock over 1-2 minutes. Warm prior to use.    ? furosemide (LASIX) 20 MG tablet Take 20 mg by mouth as needed.    ? HYDROcodone-acetaminophen (NORCO/VICODIN) 5-325 MG tablet Take 1 tablet by mouth daily as needed for moderate pain. 30 tablet 0  ? lansoprazole (PREVACID) 30 MG capsule Take 1 capsule (30 mg total) by mouth in the morning and at bedtime. 180 capsule 3  ? NAPHCON-A 0.025-0.3 % ophthalmic solution Place 2 drops into both eyes in the morning and at bedtime.    ? PROAIR HFA 108 (90 Base) MCG/ACT inhaler Inhale 2 puffs into the lungs every 4 (four) hours as needed for wheezing or shortness of breath.    ? palbociclib (IBRANCE) 100 MG tablet TAKE 1 TABLET BY MOUTH 1 TIME A DAY ON DAYS 1 TO 21 OF A 28 DAY CYCLE 21 tablet 4  ? ?No current facility-administered medications for this visit.  ? ? ?ALLERGIES:  ?Allergies  ?Allergen Reactions  ? Augmentin [Amoxicillin-Pot Clavulanate] Anaphylaxis  ? Ciprofloxacin Anaphylaxis  ? Claritin [Loratadine] Anaphylaxis  ? Gabapentin Anaphylaxis  ? Keflex [Cephalexin] Anaphylaxis  ? Levofloxacin Anaphylaxis  ? Lisinopril Swelling  ? Lisinopril Anaphylaxis  ?  Metoprolol Swelling  ? Metoprolol Anaphylaxis  ? Nexium [Esomeprazole] Anaphylaxis  ? Sulfa Antibiotics Hives and Swelling  ? Tetracyclines & Related Anaphylaxis  ? Ceftin [Cefuroxime Axetil] Hives  ? Chrom

## 2022-03-30 ENCOUNTER — Encounter (HOSPITAL_COMMUNITY): Payer: Self-pay | Admitting: Hematology

## 2022-03-31 ENCOUNTER — Encounter (HOSPITAL_COMMUNITY): Payer: Self-pay | Admitting: Hematology

## 2022-04-04 ENCOUNTER — Ambulatory Visit (INDEPENDENT_AMBULATORY_CARE_PROVIDER_SITE_OTHER): Payer: Medicaid Other | Admitting: Gastroenterology

## 2022-04-04 ENCOUNTER — Encounter (INDEPENDENT_AMBULATORY_CARE_PROVIDER_SITE_OTHER): Payer: Self-pay | Admitting: Gastroenterology

## 2022-04-04 VITALS — BP 118/75 | HR 87 | Temp 98.1°F | Ht 64.5 in | Wt 181.6 lb

## 2022-04-04 DIAGNOSIS — R1310 Dysphagia, unspecified: Secondary | ICD-10-CM | POA: Diagnosis not present

## 2022-04-04 NOTE — Progress Notes (Signed)
[name], M.D. ?Gastroenterology & Hepatology ?[facility] ?[address] ?[place], [place] [address] ? ?Primary Care Physician: ?Renee Rival, FNP ?[address] ?[place] [address] ? ?I will communicate my assessment and recommendations to the referring MD via EMR. ? ?Problems: ?Chronic dysphagia ? ?History of Present Illness: ?Wendy Zamora is a 61 y.o. female with metastatic breast cancer s/p bilateral mastectomy, radiation therapy and currently on chemotherapy on low dose Ibrance and Jacob Moores presents for follow up of dysphagia. ? ?The patient was last seen on 12/27/2021. At that time, the patient was) esophagogastroduodenospy EGD was performed on 01/22/2022.  EGD with findings described below.  Notably, I received the results of her modified barium swallow that showed decreased muscle contraction but there was no official report about this. ? ?Patient states she still feels food is getting stuck in her chest, especially chicken or meat. Dysphagia is present on and off. Sometimes this can happen as well with liquid. Food has been going down but only one time she had to vomit the food. No odynophagia but has discomfort in her chest when swallowing. States dysphagia has been present since she started [name]. She also reports having discomfort in her epigastric area and has sensation of "hard sensation in her epigastric area". The patient denies having any nausea, vomiting, fever, chills, hematochezia, melena, hematemesis, abdominal distention, abdominal pain, diarrhea, jaundice, pruritus or weight loss. ? ?Patient states that she has an appointment for manometry on 04/10/2022. ? ?Had CT chest, abdomen and pelvis on 03/21/2022 showing" ?1. No evidence of lymphadenopathy or soft tissue metastatic disease ?in the chest, abdomen, or pelvis. ?2. Unchanged sclerotic osseous metastatic disease involving the ?vertebral bodies,  bony pelvis, and right femoral neck. ?3. Status post left mastectomy. ?4. Emphysema. ? ?Had PET on 03/24/22 - Increased radiotracer uptake in the thoracolumbar spine and RIGHT anterolateral fifth rib consistent with known osseous metastatic ?disease. ? ?Last EGD: 01/22/22 ?- No endoscopic esophageal abnormality to explain patient's dysphagia. Dilated. Biopsied. ?- 2 cm hiatal hernia. ?- Erosive gastropathy with no stigmata of recent bleeding. Biopsied. ?- Normal examined duodenum. ?Last Colonoscopy: 11/03/2020, cecal and rectal polyp between 3 to 1 cm in size removed with a hot snare. ?No pathology available ? ?Path ?A. STOMACH, BIOPSY:  ?- Gastric antral mucosa with nonspecific reactive gastropathy  ?- Gastric oxyntic mucosa with parietal cell hyperplasia as can be seen  ?in hypergastrinemic states such as PPI therapy.  ?- Helicobacter pylori-like organisms are not identified on routine HE  ?stain  ? ?B. ESOPHAGUS, DISTAL, BIOPSY:  ?- Esophageal squamous mucosa with no specific histopathologic changes  ?- Negative for increased intraepithelial eosinophils  ? ?C. ESOPHAGUS, MID, BIOPSY:  ?- Esophageal squamous mucosa with no specific histopathologic changes  ?- Negative for increased intraepithelial eosinophils  ? ?Past Medical History: ?Past Medical History:  ?Diagnosis Date  ? Allergy   ? Asthma   ? Asthma   ? diagnosed age 60  ? Bone metastasis 08/2020  ? was on prolia  ? Breast cancer ([place])   ? started chemo 05/05/2019. finished 08/2019  ? Gangrene ([place])   ? GERD (gastroesophageal reflux disease)   ? Hypertension   ? Hypertension   ? started bp meds 2008  ? Lymphedema of arm   ? left arm  ? Osteopenia   ? Shingles 04/20/2020  ? left side  ? ? ?Past Surgical History: ?Past Surgical History:  ?Procedure Laterality Date  ? BIOPSY  01/22/2022  ? Procedure: BIOPSY;  Surgeon: Montez Morita, Quillian Quince, MD;  [facility];  Service: Gastroenterology;;  ? CARPAL TUNNEL RELEASE Bilateral 2006  ?  ESOPHAGOGASTRODUODENOSCOPY (EGD) WITH PROPOFOL N/A 01/22/2022  ? Procedure: ESOPHAGOGASTRODUODENOSCOPY (EGD) WITH PROPOFOL;  Surgeon: Harvel Quale, MD;  [facility];  Service: Gastroenterology;  Laterality: N/A;  205  ? hysteroscopy    ? age 42  ? mastectomy Bilateral 10/22/2019  ? pt had necrosis after 1st surgery, had another surgery to remove implants 12/10/2019  ? MOUTH SURGERY    ? TUBAL LIGATION    ? TUBAL LIGATION Bilateral 1998  ? WISDOM TOOTH EXTRACTION    ? early 58's  ? ? ?Family History: ?Family History  ?Problem Relation Age of Onset  ? Leukemia Mother   ? Breast cancer Sister   ? Diabetes Maternal Uncle   ? Hyperlipidemia Maternal Uncle   ? Diabetes Mellitus II Maternal Grandmother   ? Heart disease Maternal Grandfather   ? Diabetes Maternal Grandfather   ? Breast cancer Cousin   ? Breast cancer Half-Sister   ? Osteoporosis Half-Sister   ? Lupus Half-Sister   ? Lupus Half-Sister   ? Osteoporosis Half-Sister   ? Cancer - Lung Neg Hx   ? Colon cancer Neg Hx   ? ? ?Social History: ?Social History  ? ?Tobacco Use  ?Smoking Status Every Day  ? Packs/day: 0.50  ? Years: 40.00  ? Pack years: 20.00  ? Types: Cigarettes  ?Smokeless Tobacco Never  ?Tobacco Comments  ? Smokes half a pack a day for 40 years.   ? ?Social History  ? ?Substance and Sexual Activity  ?Alcohol Use Never  ? ?Social History  ? ?Substance and Sexual Activity  ?Drug Use Never  ? ? ?Allergies: ?Allergies  ?Allergen Reactions  ? Augmentin [Amoxicillin-Pot Clavulanate] Anaphylaxis  ? Ciprofloxacin Anaphylaxis  ? Claritin [Loratadine] Anaphylaxis  ? Gabapentin Anaphylaxis  ? Keflex [Cephalexin] Anaphylaxis  ? Levofloxacin Anaphylaxis  ? Lisinopril Swelling  ? Lisinopril Anaphylaxis  ? Metoprolol Swelling  ? Metoprolol Anaphylaxis  ? Nexium [Esomeprazole] Anaphylaxis  ? Sulfa Antibiotics Hives and Swelling  ? Tetracyclines & Related Anaphylaxis  ? Ceftin [Cefuroxime Axetil] Hives  ? Chromium Other (See Comments)  ?   headache  ? Oxycodone Itching  ? Sulfa Antibiotics   ?  Possible Reaction   ? Latex Rash  ?  itching  ? Nickel Rash  ?  itching  ? ? ?Medications: ?Current Outpatient Medications  ?Medication Sig Dispense Refill  ? amLODipine (NORVASC) 5 MG tablet Take 5 mg by mouth daily.    ? Ascorbic Acid (VITAMIN C) 1000 MG tablet Take 1,000 mg by mouth daily.    ? aspirin 81 MG EC tablet Take 81 mg by mouth daily.    ? butalbital-acetaminophen-caffeine (FIORICET) 50-325-40 MG tablet Take 1 tablet by mouth as needed for headache. 30 tablet 5  ? Calcium-Magnesium-Vitamin D (CALCIUM 1200+D3 PO) Take 1 tablet by mouth daily.    ? cetirizine-pseudoephedrine (ZYRTEC-D) 5-120 MG tablet Take 1 tablet by mouth daily.    ? CINNAMON PO Take 1,000 mg by mouth daily.    ? cyanocobalamin 1000 MCG tablet Take 1 tablet (1,000 mcg total) by mouth daily. (Patient taking differently: Take 1,000 mcg by mouth every other day.) 30 tablet 6  ? cyclobenzaprine (FLEXERIL) 10 MG tablet Take 1 tablet (10 mg total) by mouth 2 (two) times daily as needed for muscle spasms. 60 tablet 5  ? denosumab (  XGEVA) 120 MG/1.7ML SOLN injection Inject 120 mg into the skin every 30 (thirty) days.    ? fulvestrant (FASLODEX) 250 MG/5ML injection Inject 500 mg into the muscle every 30 (thirty) days. One injection each buttock over 1-2 minutes. Warm prior to use.    ? furosemide (LASIX) 20 MG tablet Take 20 mg by mouth as needed.    ? HYDROcodone-acetaminophen (NORCO/VICODIN) 5-325 MG tablet Take 1 tablet by mouth daily as needed for moderate pain. 30 tablet 0  ? lansoprazole (PREVACID) 30 MG capsule Take 1 capsule (30 mg total) by mouth in the morning and at bedtime. 180 capsule 3  ? NAPHCON-A 0.025-0.3 % ophthalmic solution Place 2 drops into both eyes in the morning and at bedtime.    ? palbociclib (IBRANCE) 100 MG tablet TAKE 1 TABLET BY MOUTH 1 TIME A DAY ON DAYS 1 TO 21 OF A 28 DAY CYCLE 21 tablet 4  ? PROAIR HFA 108 (90 Base) MCG/ACT inhaler Inhale 2 puffs into the  lungs every 4 (four) hours as needed for wheezing or shortness of breath.    ? ?No current facility-administered medications for this visit.  ? ? ?Review of Systems: ?GENERAL: negative for malaise, night sweats ?HEENT: No change

## 2022-04-04 NOTE — Patient Instructions (Signed)
Proceed with scheduled esophageal manometry ?Can take Tylenol as needed for pain in lower chest ?Follow up with Dr. Delton Coombes regarding bone mets ?

## 2022-04-10 DIAGNOSIS — K2289 Other specified disease of esophagus: Secondary | ICD-10-CM | POA: Diagnosis not present

## 2022-04-10 DIAGNOSIS — R1310 Dysphagia, unspecified: Secondary | ICD-10-CM | POA: Diagnosis not present

## 2022-04-10 DIAGNOSIS — R1319 Other dysphagia: Secondary | ICD-10-CM | POA: Diagnosis not present

## 2022-04-11 ENCOUNTER — Telehealth (INDEPENDENT_AMBULATORY_CARE_PROVIDER_SITE_OTHER): Payer: Self-pay | Admitting: Gastroenterology

## 2022-04-11 NOTE — Telephone Encounter (Signed)
I called the patient to inform about the results of esophageal manometry which was performed on 423 and showed elevated IRP of 36.1 mmHg with presence of deglution WNL.  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  The patient understood and agreed. ? ?Hi Wendy Zamora, can you please schedule a barium esophagram? Dx: Marlene Lard, achalasia protocol ? ?Thanks, ? ?[name], MD ?Gastroenterology and Hepatology ?[facility] ?

## 2022-04-12 ENCOUNTER — Other Ambulatory Visit (INDEPENDENT_AMBULATORY_CARE_PROVIDER_SITE_OTHER): Payer: Self-pay

## 2022-04-12 DIAGNOSIS — K224 Dyskinesia of esophagus: Secondary | ICD-10-CM

## 2022-04-12 DIAGNOSIS — Z139 Encounter for screening, unspecified: Secondary | ICD-10-CM | POA: Diagnosis not present

## 2022-04-12 NOTE — Telephone Encounter (Signed)
Thanks

## 2022-04-13 LAB — LIPID PANEL
Chol/HDL Ratio: 4.1 ratio (ref 0.0–4.4)
Cholesterol, Total: 228 mg/dL — ABNORMAL HIGH (ref 100–199)
HDL: 55 mg/dL (ref >39)
LDL Chol Calc (NIH): 147 mg/dL — ABNORMAL HIGH (ref 0–99)
Triglycerides: 143 mg/dL (ref 0–149)
VLDL Cholesterol Cal: 26 mg/dL (ref 5–40)

## 2022-04-13 LAB — HEMOGLOBIN A1C
Est. average glucose Bld gHb Est-mCnc: 111 mg/dL
Hgb A1c MFr Bld: 5.5 % (ref 4.8–5.6)

## 2022-04-13 LAB — VITAMIN D 25 HYDROXY (VIT D DEFICIENCY, FRACTURES): Vit D, 25-Hydroxy: 32.3 ng/mL (ref 30.0–100.0)

## 2022-04-13 LAB — TSH: TSH: 1.15 u[IU]/mL (ref 0.450–4.500)

## 2022-04-13 LAB — HIV ANTIBODY (ROUTINE TESTING W REFLEX): HIV Screen 4th Generation wRfx: NONREACTIVE

## 2022-04-13 LAB — HEPATITIS C ANTIBODY: Hep C Virus Ab: NONREACTIVE

## 2022-04-17 ENCOUNTER — Ambulatory Visit (HOSPITAL_COMMUNITY)
Admission: RE | Admit: 2022-04-17 | Discharge: 2022-04-17 | Disposition: A | Discharge status: Home / Self Care | Payer: Medicaid Other | Source: Ambulatory Visit | Attending: Gastroenterology | Admitting: Gastroenterology

## 2022-04-17 ENCOUNTER — Encounter (HOSPITAL_COMMUNITY): Payer: Self-pay

## 2022-04-17 DIAGNOSIS — K224 Dyskinesia of esophagus: Secondary | ICD-10-CM

## 2022-04-18 ENCOUNTER — Telehealth: Payer: Self-pay

## 2022-04-18 ENCOUNTER — Encounter: Payer: Self-pay | Admitting: Nurse Practitioner

## 2022-04-18 ENCOUNTER — Ambulatory Visit (INDEPENDENT_AMBULATORY_CARE_PROVIDER_SITE_OTHER): Payer: Medicaid Other | Admitting: Nurse Practitioner

## 2022-04-18 ENCOUNTER — Other Ambulatory Visit (HOSPITAL_COMMUNITY)
Admission: RE | Admit: 2022-04-18 | Discharge: 2022-04-18 | Disposition: A | Discharge status: Home / Self Care | Payer: Medicaid Other | Source: Ambulatory Visit | Attending: Nurse Practitioner | Admitting: Nurse Practitioner

## 2022-04-18 VITALS — BP 118/77 | HR 83 | Ht 64.0 in | Wt 180.0 lb

## 2022-04-18 DIAGNOSIS — R0789 Other chest pain: Secondary | ICD-10-CM

## 2022-04-18 DIAGNOSIS — E785 Hyperlipidemia, unspecified: Secondary | ICD-10-CM | POA: Insufficient documentation

## 2022-04-18 DIAGNOSIS — R109 Unspecified abdominal pain: Secondary | ICD-10-CM | POA: Insufficient documentation

## 2022-04-18 DIAGNOSIS — R1013 Epigastric pain: Secondary | ICD-10-CM

## 2022-04-18 DIAGNOSIS — Z124 Encounter for screening for malignant neoplasm of cervix: Secondary | ICD-10-CM | POA: Insufficient documentation

## 2022-04-18 DIAGNOSIS — Z0001 Encounter for general adult medical examination with abnormal findings: Secondary | ICD-10-CM

## 2022-04-18 DIAGNOSIS — Z0000 Encounter for general adult medical examination without abnormal findings: Secondary | ICD-10-CM | POA: Insufficient documentation

## 2022-04-18 DIAGNOSIS — I10 Essential (primary) hypertension: Secondary | ICD-10-CM | POA: Diagnosis not present

## 2022-04-18 DIAGNOSIS — E782 Mixed hyperlipidemia: Secondary | ICD-10-CM

## 2022-04-18 DIAGNOSIS — F17200 Nicotine dependence, unspecified, uncomplicated: Secondary | ICD-10-CM | POA: Diagnosis not present

## 2022-04-18 MED ORDER — BUTALBITAL-APAP-CAFFEINE 50-325-40 MG PO TABS: 1.0000 | ORAL_TABLET | ORAL | 0 refills | Status: DC | PRN

## 2022-04-18 MED ORDER — FUROSEMIDE 20 MG PO TABS: 20.0000 mg | ORAL_TABLET | Freq: Every day | ORAL | 0 refills | Status: DC | PRN

## 2022-04-18 MED ORDER — AMLODIPINE BESYLATE 5 MG PO TABS: 5.0000 mg | ORAL_TABLET | Freq: Every day | ORAL | 1 refills | Status: DC

## 2022-04-18 MED ORDER — FUROSEMIDE 20 MG PO TABS: 20.0000 mg | ORAL_TABLET | ORAL | 0 refills | Status: DC | PRN

## 2022-04-18 NOTE — Telephone Encounter (Signed)
Wendy Zamora from [facility] called said both of these prescriptions were sent in with no directions. ? ?butalbital-acetaminophen-caffeine (FIORICET) 50-325-40 MG tablet ? ?furosemide (LASIX) 20 MG tablet ? ?[facility] [address] [phone] ?

## 2022-04-18 NOTE — Assessment & Plan Note (Signed)
Chronic condition ?Most likely due to her metastatic breast cancer ?Continue Norco5-'325mg'$  table,t 1 tablet daily  as needed ?Flexeril '10mg'$  BID PRN ?

## 2022-04-18 NOTE — Assessment & Plan Note (Signed)
Annual exam as documented.  ?Counseling done include healthy lifestyle involving committing to 150 minutes of exercise per week, heart healthy diet, and attaining healthy weight. The importance of adequate sleep also discussed.  ?Regular use of seat belt and home safety were also discussed . ?Changes in health habits are decided on by patient with goals and time frames set for achieving them. ?Immunization and cancer screening  needs are specifically addressed at this visit.   ?

## 2022-04-18 NOTE — Assessment & Plan Note (Addendum)
Followed by GI ?States that GI thinks that she has abdominal hernia ?Pain may also be related to her GERD,On lansoprazole 30 mg twice daily for GERD ?Patient encouraged to maintain close follow-up with GI ?Has upcoming DG esophagus   ?

## 2022-04-18 NOTE — Assessment & Plan Note (Signed)
BP Readings from Last 3 Encounters:  ?04/18/22 118/77  ?04/04/22 118/75  ?03/28/22 124/83  ?Condition well-controlled on amlodipine 5 mg daily ?Medication refilled today ?Continue current medication ?DASH diet advised, engage in regular daily exercise. ?Follow-up in 4 months ?CMP results reviewed ?

## 2022-04-18 NOTE — Addendum Note (Signed)
Addended by: Renee Rival on: 04/18/2022 03:17 PM ? ? Modules accepted: Orders ? ?

## 2022-04-18 NOTE — Progress Notes (Signed)
? ?Complete physical exam ? ?Patient: Wendy Zamora   DOB: 1961-06-15   61 y.o. Female  MRN: [id number] ? ?Subjective:  ?  ?Chief Complaint  ?Patient presents with  ? Annual Exam  ?  cpe  ? Gynecologic Exam  ? ? ?Wendy Zamora is a 61 y.o. female with past medical history of asthma, bone metastasis, breast cancer, GERD, hypertension, osteopenia who presents today for a complete physical exam. She reports consuming a general diet. Does 30 minutes of walking daily at home.  She generally feels fairly well. ?Insomnia  She reports sleeping poorly at night , snores when sleeping, doses off during the day , feels fatigued a lot. Never been told she has sleep apnea, was taking melatonin in the past. It takes an hour or too before she falls asleep after laying down. Gest about 4 hours of sleep nightly. She has tried melatonin before but it did not help.  ? ? ?Most recent fall risk assessment: ? ?  04/18/2022  ? 11:28 AM  ?Fall Risk   ?Falls in the past year? 0  ?Number falls in past yr: 0  ?Injury with Fall? 0  ?Risk for fall due to : No Fall Risks  ?Follow up Falls evaluation completed  ? ?  ?Most recent depression screenings: ? ?  04/18/2022  ? 11:28 AM 01/18/2022  ? 11:00 AM  ?PHQ 2/9 Scores  ?PHQ - 2 Score 0 0  ? ? ?Vision:Not within last year  wears prescription glasses, states that she will find an opthalmology by herself. Not been to a dentist yet, medicaid sent her a list of dentist around here and she is going to schedule an appointment with on soon. ? ? ? ?Patient Care Team: ?Renee Rival, FNP as PCP - General (Nurse Practitioner) ?Brien Mates, RN as Oncology Nurse Navigator (Oncology)  ? ?Outpatient Medications Prior to Visit  ?Medication Sig  ? Ascorbic Acid (VITAMIN C) 1000 MG tablet Take 1,000 mg by mouth daily.  ? aspirin 81 MG EC tablet Take 81 mg by mouth daily.  ? Calcium-Magnesium-Vitamin D (CALCIUM 1200+D3 PO) Take 1 tablet by mouth daily.  ? cetirizine-pseudoephedrine (ZYRTEC-D) 5-120 MG  tablet Take 1 tablet by mouth daily.  ? CINNAMON PO Take 1,000 mg by mouth daily.  ? cyanocobalamin 1000 MCG tablet Take 1 tablet (1,000 mcg total) by mouth daily. (Patient taking differently: Take 1,000 mcg by mouth every other day.)  ? cyclobenzaprine (FLEXERIL) 10 MG tablet Take 1 tablet (10 mg total) by mouth 2 (two) times daily as needed for muscle spasms.  ? denosumab (XGEVA) 120 MG/1.7ML SOLN injection Inject 120 mg into the skin every 30 (thirty) days.  ? fulvestrant (FASLODEX) 250 MG/5ML injection Inject 500 mg into the muscle every 30 (thirty) days. One injection each buttock over 1-2 minutes. Warm prior to use.  ? lansoprazole (PREVACID) 30 MG capsule Take 1 capsule (30 mg total) by mouth in the morning and at bedtime.  ? NAPHCON-A 0.025-0.3 % ophthalmic solution Place 2 drops into both eyes in the morning and at bedtime.  ? palbociclib (IBRANCE) 100 MG tablet TAKE 1 TABLET BY MOUTH 1 TIME A DAY ON DAYS 1 TO 21 OF A 28 DAY CYCLE  ? PROAIR HFA 108 (90 Base) MCG/ACT inhaler Inhale 2 puffs into the lungs every 4 (four) hours as needed for wheezing or shortness of breath.  ? [DISCONTINUED] amLODipine (NORVASC) 5 MG tablet Take 5 mg by mouth daily.  ? [DISCONTINUED]  butalbital-acetaminophen-caffeine (FIORICET) 50-325-40 MG tablet Take 1 tablet by mouth as needed for headache.  ? [DISCONTINUED] furosemide (LASIX) 20 MG tablet Take 20 mg by mouth as needed.  ? HYDROcodone-acetaminophen (NORCO/VICODIN) 5-325 MG tablet Take 1 tablet by mouth daily as needed for moderate pain. (Patient not taking: Reported on 04/18/2022)  ? ?No facility-administered medications prior to visit.  ? ? ?Review of Systems  ?Constitutional: Negative.   ?HENT: Negative.    ?Eyes: Negative.   ?Respiratory: Negative.    ?Cardiovascular:  Positive for chest pain. Negative for palpitations, orthopnea, claudication, leg swelling and PND.  ?Gastrointestinal:  Positive for abdominal pain. Negative for blood in stool, constipation, diarrhea,  heartburn, melena, nausea and vomiting.  ?Genitourinary: Negative.   ?Musculoskeletal:  Positive for myalgias.  ?Skin:  Negative for itching and rash.  ?      ?Bruises noted on her left chest with scar tissues from bilateral mastectomy   ?Neurological: Negative.   ?Endo/Heme/Allergies: Negative.   ?Psychiatric/Behavioral: Negative.  Negative for depression, hallucinations, memory loss, substance abuse and suicidal ideas. The patient is not nervous/anxious and does not have insomnia.   ?     Insomnia   ? ? ? ? ?   ?Objective:  ? ?  ?BP 118/77 (BP Location: Right Arm, Patient Position: Sitting, Cuff Size: Large)   Pulse 83   Ht '5\' 4"'$  (1.626 m)   Wt 180 lb (81.6 kg)   LMP 06/08/2012   SpO2 99%   BMI 30.90 kg/m?  ? ?Brandi LPN present as chaperone ?Physical Exam ?Constitutional:   ?   General: She is not in acute distress. ?   Appearance: Normal appearance. She is obese. She is not ill-appearing, toxic-appearing or diaphoretic.  ?HENT:  ?   Head: Normocephalic and atraumatic.  ?   Right Ear: Tympanic membrane, ear canal and external ear normal. There is no impacted cerumen.  ?   Left Ear: Tympanic membrane, ear canal and external ear normal. There is no impacted cerumen.  ?   Nose: No congestion or rhinorrhea.  ?   Mouth/Throat:  ?   Mouth: Mucous membranes are moist.  ?   Pharynx: Oropharynx is clear. No oropharyngeal exudate or posterior oropharyngeal erythema.  ?Eyes:  ?   General: No scleral icterus.    ?   Right eye: No discharge.  ?   Extraocular Movements: Extraocular movements intact.  ?   Conjunctiva/sclera: Conjunctivae normal.  ?   Pupils: Pupils are equal, round, and reactive to light.  ?Neck:  ?   Vascular: No carotid bruit.  ?Cardiovascular:  ?   Rate and Rhythm: Normal rate and regular rhythm.  ?   Pulses: Normal pulses.  ?   Heart sounds: Normal heart sounds. No murmur heard. ?  No friction rub. No gallop.  ?Pulmonary:  ?   Effort: Pulmonary effort is normal. No respiratory distress.  ?   Breath  sounds: Normal breath sounds. No stridor. No wheezing, rhonchi or rales.  ?Chest:  ?   Chest wall: Deformity and tenderness present. No swelling or edema.  ?Breasts: ?   Right: Absent.  ?   Left: Absent.  ?Abdominal:  ?   General: There is no distension.  ?   Palpations: There is no mass.  ?   Tenderness: There is abdominal tenderness. There is no right CVA tenderness, left CVA tenderness, guarding or rebound.  ?   Hernia: No hernia is present. There is no hernia in the left inguinal area or  right inguinal area.  ?   Comments: Tenderness of mid upper epigastric area  ?Genitourinary: ?   General: Normal vulva.  ?   Pubic Area: No rash or pubic lice.   ?   Tanner stage (genital): 5.  ?   Labia:     ?   Right: No rash, tenderness, lesion or injury.     ?   Left: No rash, tenderness, lesion or injury.   ?   Vagina: No signs of injury and foreign body. No vaginal discharge, erythema, tenderness, bleeding, lesions or prolapsed vaginal walls.  ?   Cervix: Dilated. No cervical motion tenderness, discharge, friability, lesion, erythema, cervical bleeding or eversion.  ?   Uterus: Not deviated, not enlarged, not fixed, not tender and no uterine prolapse.   ?   Adnexa:     ?   Right: No mass, tenderness or fullness.      ?   Left: No mass, tenderness or fullness.    ?Musculoskeletal:     ?   General: No swelling, tenderness, deformity or signs of injury.  ?   Cervical back: Normal range of motion and neck supple. No rigidity or tenderness.  ?   Right lower leg: Edema present.  ?   Left lower leg: Edema present.  ?   Comments: Right upper extremity lymphedema ?Tenderness on palpation of left chest wall , chronic condition  ?Lymphadenopathy:  ?   Cervical: No cervical adenopathy.  ?   Upper Body:  ?   Right upper body: No supraclavicular, axillary or pectoral adenopathy.  ?   Left upper body: No supraclavicular, axillary or pectoral adenopathy.  ?   Lower Body: No right inguinal adenopathy. No left inguinal adenopathy.  ?Skin: ?    Capillary Refill: Capillary refill takes less than 2 seconds.  ?   Coloration: Skin is not jaundiced or pale.  ?   Findings: Bruising present. No lesion.  ?   Comments: Scar tissues from previous bilateral mastec

## 2022-04-18 NOTE — Assessment & Plan Note (Signed)
Patient continues to smoke half a pack daily.Need to quit including risk of COPD heart disease stroke MI discussed with patient. ?Patient encouraged to continue to work on cutting back with the aim of completely quitting.  ? ?

## 2022-04-18 NOTE — Assessment & Plan Note (Signed)
Lab Results  ?Component Value Date  ? CHOL 228 (H) 04/12/2022  ? HDL 55 04/12/2022  ? LDLCALC 147 (H) 04/12/2022  ? TRIG 143 04/12/2022  ? CHOLHDL 4.1 04/12/2022  ?current everyday smoker, no DM, CKD ?Favoid fried, fatty foods ?Will plan to start medication if LDL does not improve at next visit.   ?

## 2022-04-18 NOTE — Patient Instructions (Signed)
Please get your TDAP vaccine, Covid booster vaccine and your pharmacy .  ? ?It is important that you exercise regularly at least 30 minutes 5 times a week.  ?Think about what you will eat, plan ahead. ?Choose " clean, green, fresh or frozen" over canned, processed or packaged foods which are more sugary, salty and fatty. ?70 to 75% of food eaten should be vegetables and fruit. ?Three meals at set times with snacks allowed between meals, but they must be fruit or vegetables. ?Aim to eat over a 12 hour period , example 7 am to 7 pm, and STOP after  your last meal of the day. ?Drink water,generally about 64 ounces per day, no other drink is as healthy. Fruit juice is best enjoyed in a healthy way, by EATING the fruit. ? ?Thanks for choosing [facility], we consider it a privelige to serve you. ? ?

## 2022-04-18 NOTE — Telephone Encounter (Signed)
Please advise 

## 2022-04-22 LAB — CYTOLOGY - PAP
Comment: NEGATIVE
Diagnosis: NEGATIVE
High risk HPV: NEGATIVE

## 2022-04-23 ENCOUNTER — Ambulatory Visit (HOSPITAL_COMMUNITY)
Admission: RE | Admit: 2022-04-23 | Discharge: 2022-04-23 | Disposition: A | Discharge status: Home / Self Care | Payer: Medicaid Other | Source: Ambulatory Visit | Attending: Gastroenterology | Admitting: Gastroenterology

## 2022-04-23 ENCOUNTER — Other Ambulatory Visit (INDEPENDENT_AMBULATORY_CARE_PROVIDER_SITE_OTHER): Payer: Self-pay | Admitting: Gastroenterology

## 2022-04-23 DIAGNOSIS — K224 Dyskinesia of esophagus: Secondary | ICD-10-CM | POA: Diagnosis not present

## 2022-04-23 DIAGNOSIS — K449 Diaphragmatic hernia without obstruction or gangrene: Secondary | ICD-10-CM | POA: Diagnosis not present

## 2022-04-23 NOTE — Progress Notes (Signed)
Normal Pap, repeat in 3 years

## 2022-04-25 ENCOUNTER — Inpatient Hospital Stay (HOSPITAL_COMMUNITY): Payer: Medicaid Other

## 2022-04-25 ENCOUNTER — Inpatient Hospital Stay (HOSPITAL_COMMUNITY): Payer: Medicaid Other | Attending: Hematology

## 2022-04-25 VITALS — BP 138/68 | HR 77 | Temp 97.5°F | Resp 18

## 2022-04-25 DIAGNOSIS — Z9012 Acquired absence of left breast and nipple: Secondary | ICD-10-CM | POA: Diagnosis not present

## 2022-04-25 DIAGNOSIS — C7951 Secondary malignant neoplasm of bone: Secondary | ICD-10-CM | POA: Diagnosis not present

## 2022-04-25 DIAGNOSIS — C50919 Malignant neoplasm of unspecified site of unspecified female breast: Secondary | ICD-10-CM

## 2022-04-25 DIAGNOSIS — Z170 Estrogen receptor positive status [ER+]: Secondary | ICD-10-CM | POA: Diagnosis not present

## 2022-04-25 DIAGNOSIS — C50912 Malignant neoplasm of unspecified site of left female breast: Secondary | ICD-10-CM | POA: Diagnosis not present

## 2022-04-25 DIAGNOSIS — Z5111 Encounter for antineoplastic chemotherapy: Secondary | ICD-10-CM | POA: Diagnosis not present

## 2022-04-25 LAB — COMPREHENSIVE METABOLIC PANEL
ALT: 28 U/L (ref 0–44)
AST: 28 U/L (ref 15–41)
Albumin: 4.3 g/dL (ref 3.5–5.0)
Alkaline Phosphatase: 58 U/L (ref 38–126)
Anion gap: 7 (ref 5–15)
BUN: 12 mg/dL (ref 6–20)
CO2: 25 mmol/L (ref 22–32)
Calcium: 9.3 mg/dL (ref 8.9–10.3)
Chloride: 109 mmol/L (ref 98–111)
Creatinine, Ser: 1.11 mg/dL — ABNORMAL HIGH (ref 0.44–1.00)
GFR, Estimated: 57 mL/min — ABNORMAL LOW (ref >60)
Glucose, Bld: 87 mg/dL (ref 70–99)
Potassium: 3.6 mmol/L (ref 3.5–5.1)
Sodium: 141 mmol/L (ref 135–145)
Total Bilirubin: 0.5 mg/dL (ref 0.3–1.2)
Total Protein: 7.5 g/dL (ref 6.5–8.1)

## 2022-04-25 LAB — CBC WITH DIFFERENTIAL/PLATELET
Abs Immature Granulocytes: 0.01 10*3/uL (ref 0.00–0.07)
Basophils Absolute: 0 10*3/uL (ref 0.0–0.1)
Basophils Relative: 2 %
Eosinophils Absolute: 0 10*3/uL (ref 0.0–0.5)
Eosinophils Relative: 2 %
HCT: 37.9 % (ref 36.0–46.0)
Hemoglobin: 13.1 g/dL (ref 12.0–15.0)
Immature Granulocytes: 0 %
Lymphocytes Relative: 42 %
Lymphs Abs: 1.1 10*3/uL (ref 0.7–4.0)
MCH: 35.5 pg — ABNORMAL HIGH (ref 26.0–34.0)
MCHC: 34.6 g/dL (ref 30.0–36.0)
MCV: 102.7 fL — ABNORMAL HIGH (ref 80.0–100.0)
Monocytes Absolute: 0.4 10*3/uL (ref 0.1–1.0)
Monocytes Relative: 14 %
Neutro Abs: 1.1 10*3/uL — ABNORMAL LOW (ref 1.7–7.7)
Neutrophils Relative %: 40 %
Platelets: 240 10*3/uL (ref 150–400)
RBC: 3.69 MIL/uL — ABNORMAL LOW (ref 3.87–5.11)
RDW: 13.2 % (ref 11.5–15.5)
WBC Morphology: >10
WBC: 2.7 10*3/uL — ABNORMAL LOW (ref 4.0–10.5)
nRBC: 0 % (ref 0.0–0.2)

## 2022-04-25 MED ORDER — DENOSUMAB 120 MG/1.7ML ~~LOC~~ SOLN
120.0000 mg | Freq: Once | SUBCUTANEOUS | Status: AC
  Administered 2022-04-25: 120 mg via SUBCUTANEOUS
  Filled 2022-04-25: qty 1.7

## 2022-04-25 MED ORDER — FULVESTRANT 250 MG/5ML IM SOSY
500.0000 mg | PREFILLED_SYRINGE | Freq: Once | INTRAMUSCULAR | Status: AC
  Administered 2022-04-25: 500 mg via INTRAMUSCULAR
  Filled 2022-04-25: qty 10

## 2022-04-25 NOTE — Progress Notes (Signed)
Patient presents today for Faslodex and Xgeva injections.  Patient has been taking Calcium and vitamin D supplements, has had no jaw pain and no dental work.  Vital signs WNL.  Patient has no new complaints at this time. ? ?Calcium noted to be 9.3. ? ?Stable during administration without incident; injection sites WNL; see MAR for injection details.  Patient tolerated procedure well and without incident.  No questions or complaints noted at this time.  ?

## 2022-04-25 NOTE — Patient Instructions (Signed)
[facility]  Discharge Instructions: ?Thank you for choosing [facility] to provide your oncology and hematology care.  ?If you have a lab appointment with the [facility], please come in thru the Main Entrance and check in at the main information desk. ? ?Wear comfortable clothing and clothing appropriate for easy access to any Portacath or PICC line.  ? ?We strive to give you quality time with your provider. You may need to reschedule your appointment if you arrive late (15 or more minutes).  Arriving late affects you and other patients whose appointments are after yours.  Also, if you miss three or more appointments without notifying the office, you may be dismissed from the clinic at the provider?s discretion.    ?  ?For prescription refill requests, have your pharmacy contact our office and allow 72 hours for refills to be completed.   ? ?Today you received the following chemotherapy and/or immunotherapy agents Xgeva Faslodex  ?  ?To help prevent nausea and vomiting after your treatment, we encourage you to take your nausea medication as directed. ? ?BELOW ARE SYMPTOMS THAT SHOULD BE REPORTED IMMEDIATELY: ?*FEVER GREATER THAN 100.4 F (38 ?C) OR HIGHER ?*CHILLS OR SWEATING ?*NAUSEA AND VOMITING THAT IS NOT CONTROLLED WITH YOUR NAUSEA MEDICATION ?*UNUSUAL SHORTNESS OF BREATH ?*UNUSUAL BRUISING OR BLEEDING ?*URINARY PROBLEMS (pain or burning when urinating, or frequent urination) ?*BOWEL PROBLEMS (unusual diarrhea, constipation, pain near the anus) ?TENDERNESS IN MOUTH AND THROAT WITH OR WITHOUT PRESENCE OF ULCERS (sore throat, sores in mouth, or a toothache) ?UNUSUAL RASH, SWELLING OR PAIN  ?UNUSUAL VAGINAL DISCHARGE OR ITCHING  ? ?Items with * indicate a potential emergency and should be followed up as soon as possible or go to the Emergency Department if any problems should occur. ? ?Please show the CHEMOTHERAPY ALERT CARD or IMMUNOTHERAPY ALERT CARD at check-in to the Emergency  Department and triage nurse. ? ?Should you have questions after your visit or need to cancel or reschedule your appointment, please contact [facility] [phone]  and follow the prompts.  Office hours are 8:00 a.m. to 4:30 p.m. Monday - Friday. Please note that voicemails left after 4:00 p.m. may not be returned until the following business day.  We are closed weekends and major holidays. You have access to a nurse at all times for urgent questions. Please call the main number to the clinic [phone] and follow the prompts. ? ?For any non-urgent questions, you may also contact your provider using MyChart. We now offer e-Visits for anyone 78 and older to request care online for non-urgent symptoms. For details visit [email or web address]. ?  ?Also download the MyChart app! Go to the app store, search "MyChart", open the app, select [facility], and log in with your MyChart username and password. ? ?Due to Covid, a mask is required upon entering the hospital/clinic. If you do not have a mask, one will be given to you upon arrival. For doctor visits, patients may have 1 support person aged 14 or older with them. For treatment visits, patients cannot have anyone with them due to current Covid guidelines and our immunocompromised population.  ?

## 2022-04-30 ENCOUNTER — Other Ambulatory Visit (HOSPITAL_COMMUNITY): Payer: Self-pay

## 2022-04-30 DIAGNOSIS — C50919 Malignant neoplasm of unspecified site of unspecified female breast: Secondary | ICD-10-CM

## 2022-04-30 DIAGNOSIS — C50912 Malignant neoplasm of unspecified site of left female breast: Secondary | ICD-10-CM | POA: Diagnosis not present

## 2022-04-30 DIAGNOSIS — Z9011 Acquired absence of right breast and nipple: Secondary | ICD-10-CM | POA: Diagnosis not present

## 2022-04-30 DIAGNOSIS — I890 Lymphedema, not elsewhere classified: Secondary | ICD-10-CM

## 2022-04-30 MED ORDER — MISC. DEVICES MISC: 0 refills | Status: DC

## 2022-05-20 DIAGNOSIS — R1310 Dysphagia, unspecified: Secondary | ICD-10-CM | POA: Diagnosis not present

## 2022-05-20 DIAGNOSIS — C50919 Malignant neoplasm of unspecified site of unspecified female breast: Secondary | ICD-10-CM | POA: Diagnosis not present

## 2022-05-20 DIAGNOSIS — E669 Obesity, unspecified: Secondary | ICD-10-CM | POA: Diagnosis not present

## 2022-05-23 ENCOUNTER — Inpatient Hospital Stay (HOSPITAL_COMMUNITY): Payer: Medicaid Other

## 2022-05-23 ENCOUNTER — Inpatient Hospital Stay (HOSPITAL_COMMUNITY): Payer: Medicaid Other | Attending: Hematology

## 2022-05-23 VITALS — BP 137/83 | HR 74 | Temp 96.9°F | Resp 20 | Wt 184.2 lb

## 2022-05-23 DIAGNOSIS — Z5111 Encounter for antineoplastic chemotherapy: Secondary | ICD-10-CM | POA: Insufficient documentation

## 2022-05-23 DIAGNOSIS — C7951 Secondary malignant neoplasm of bone: Secondary | ICD-10-CM | POA: Insufficient documentation

## 2022-05-23 DIAGNOSIS — C50912 Malignant neoplasm of unspecified site of left female breast: Secondary | ICD-10-CM | POA: Diagnosis not present

## 2022-05-23 DIAGNOSIS — Z170 Estrogen receptor positive status [ER+]: Secondary | ICD-10-CM | POA: Insufficient documentation

## 2022-05-23 DIAGNOSIS — C50919 Malignant neoplasm of unspecified site of unspecified female breast: Secondary | ICD-10-CM

## 2022-05-23 LAB — CBC WITH DIFFERENTIAL/PLATELET
Abs Immature Granulocytes: 0.02 10*3/uL (ref 0.00–0.07)
Basophils Absolute: 0 10*3/uL (ref 0.0–0.1)
Basophils Relative: 1 %
Eosinophils Absolute: 0 10*3/uL (ref 0.0–0.5)
Eosinophils Relative: 1 %
HCT: 38.3 % (ref 36.0–46.0)
Hemoglobin: 13.2 g/dL (ref 12.0–15.0)
Immature Granulocytes: 1 %
Lymphocytes Relative: 39 %
Lymphs Abs: 1.2 10*3/uL (ref 0.7–4.0)
MCH: 35 pg — ABNORMAL HIGH (ref 26.0–34.0)
MCHC: 34.5 g/dL (ref 30.0–36.0)
MCV: 101.6 fL — ABNORMAL HIGH (ref 80.0–100.0)
Monocytes Absolute: 0.4 10*3/uL (ref 0.1–1.0)
Monocytes Relative: 13 %
Neutro Abs: 1.4 10*3/uL — ABNORMAL LOW (ref 1.7–7.7)
Neutrophils Relative %: 45 %
Platelets: 240 10*3/uL (ref 150–400)
RBC: 3.77 MIL/uL — ABNORMAL LOW (ref 3.87–5.11)
RDW: 13.2 % (ref 11.5–15.5)
WBC: 3.1 10*3/uL — ABNORMAL LOW (ref 4.0–10.5)
nRBC: 0 % (ref 0.0–0.2)

## 2022-05-23 LAB — COMPREHENSIVE METABOLIC PANEL
ALT: 36 U/L (ref 0–44)
AST: 30 U/L (ref 15–41)
Albumin: 4.1 g/dL (ref 3.5–5.0)
Alkaline Phosphatase: 54 U/L (ref 38–126)
Anion gap: 5 (ref 5–15)
BUN: 14 mg/dL (ref 6–20)
CO2: 25 mmol/L (ref 22–32)
Calcium: 9.1 mg/dL (ref 8.9–10.3)
Chloride: 112 mmol/L — ABNORMAL HIGH (ref 98–111)
Creatinine, Ser: 0.75 mg/dL (ref 0.44–1.00)
GFR, Estimated: >60 mL/min (ref >60)
Glucose, Bld: 79 mg/dL (ref 70–99)
Potassium: 3.7 mmol/L (ref 3.5–5.1)
Sodium: 142 mmol/L (ref 135–145)
Total Bilirubin: 0.4 mg/dL (ref 0.3–1.2)
Total Protein: 7.1 g/dL (ref 6.5–8.1)

## 2022-05-23 MED ORDER — FULVESTRANT 250 MG/5ML IM SOSY
500.0000 mg | PREFILLED_SYRINGE | Freq: Once | INTRAMUSCULAR | Status: AC
  Administered 2022-05-23: 500 mg via INTRAMUSCULAR
  Filled 2022-05-23: qty 10

## 2022-05-23 MED ORDER — DENOSUMAB 120 MG/1.7ML ~~LOC~~ SOLN
120.0000 mg | Freq: Once | SUBCUTANEOUS | Status: AC
  Administered 2022-05-23: 120 mg via SUBCUTANEOUS
  Filled 2022-05-23: qty 1.7

## 2022-05-23 NOTE — Progress Notes (Signed)
Patient presents today for Faslodex and Xgeva injections.  Patient is in satisfactory condition with no new complaints voiced.  Vital signs are stable. We will proceed with injections per MD orders.  Patient tolerated Faslodex and Xgeva injections with no complaints voiced.  Site clean and dry with no bruising or swelling noted.  No complaints of pain.  Discharged with vital signs stable and no signs or symptoms of distress noted.

## 2022-05-23 NOTE — Patient Instructions (Signed)
[facility]  Discharge Instructions: Thank you for choosing [facility] to provide your oncology and hematology care.  If you have a lab appointment with the Cancer Center, please come in thru the Main Entrance and check in at the main information desk.  Wear comfortable clothing and clothing appropriate for easy access to any Portacath or PICC line.   We strive to give you quality time with your provider. You may need to reschedule your appointment if you arrive late (15 or more minutes).  Arriving late affects you and other patients whose appointments are after yours.  Also, if you miss three or more appointments without notifying the office, you may be dismissed from the clinic at the provider's discretion.      For prescription refill requests, have your pharmacy contact our office and allow 72 hours for refills to be completed.        To help prevent nausea and vomiting after your treatment, we encourage you to take your nausea medication as directed.  BELOW ARE SYMPTOMS THAT SHOULD BE REPORTED IMMEDIATELY: *FEVER GREATER THAN 100.4 F (38 C) OR HIGHER *CHILLS OR SWEATING *NAUSEA AND VOMITING THAT IS NOT CONTROLLED WITH YOUR NAUSEA MEDICATION *UNUSUAL SHORTNESS OF BREATH *UNUSUAL BRUISING OR BLEEDING *URINARY PROBLEMS (pain or burning when urinating, or frequent urination) *BOWEL PROBLEMS (unusual diarrhea, constipation, pain near the anus) TENDERNESS IN MOUTH AND THROAT WITH OR WITHOUT PRESENCE OF ULCERS (sore throat, sores in mouth, or a toothache) UNUSUAL RASH, SWELLING OR PAIN  UNUSUAL VAGINAL DISCHARGE OR ITCHING   Items with * indicate a potential emergency and should be followed up as soon as possible or go to the Emergency Department if any problems should occur.  Please show the CHEMOTHERAPY ALERT CARD or IMMUNOTHERAPY ALERT CARD at check-in to the Emergency Department and triage nurse.  Should you have questions after your visit or need to cancel  or reschedule your appointment, please contact [facility] [phone]  and follow the prompts.  Office hours are 8:00 a.m. to 4:30 p.m. Monday - Friday. Please note that voicemails left after 4:00 p.m. may not be returned until the following business day.  We are closed weekends and major holidays. You have access to a nurse at all times for urgent questions. Please call the main number to the clinic [phone] and follow the prompts.  For any non-urgent questions, you may also contact your provider using MyChart. We now offer e-Visits for anyone 18 and older to request care online for non-urgent symptoms. For details visit [email or web address].   Also download the MyChart app! Go to the app store, search "MyChart", open the app, select [facility], and log in with your MyChart username and password.  Due to Covid, a mask is required upon entering the hospital/clinic. If you do not have a mask, one will be given to you upon arrival. For doctor visits, patients may have 1 support person aged 18 or older with them. For treatment visits, patients cannot have anyone with them due to current Covid guidelines and our immunocompromised population.  

## 2022-06-20 ENCOUNTER — Inpatient Hospital Stay (HOSPITAL_COMMUNITY): Payer: Medicaid Other | Attending: Hematology | Admitting: Hematology

## 2022-06-20 ENCOUNTER — Inpatient Hospital Stay (HOSPITAL_COMMUNITY): Payer: Medicaid Other

## 2022-06-20 VITALS — BP 133/84 | HR 81 | Temp 97.4°F | Resp 18 | Ht 64.0 in | Wt 184.8 lb

## 2022-06-20 DIAGNOSIS — C50919 Malignant neoplasm of unspecified site of unspecified female breast: Secondary | ICD-10-CM | POA: Diagnosis not present

## 2022-06-20 DIAGNOSIS — M8580 Other specified disorders of bone density and structure, unspecified site: Secondary | ICD-10-CM | POA: Diagnosis not present

## 2022-06-20 DIAGNOSIS — Z7982 Long term (current) use of aspirin: Secondary | ICD-10-CM | POA: Insufficient documentation

## 2022-06-20 DIAGNOSIS — G629 Polyneuropathy, unspecified: Secondary | ICD-10-CM | POA: Diagnosis not present

## 2022-06-20 DIAGNOSIS — F17210 Nicotine dependence, cigarettes, uncomplicated: Secondary | ICD-10-CM | POA: Diagnosis not present

## 2022-06-20 DIAGNOSIS — Z170 Estrogen receptor positive status [ER+]: Secondary | ICD-10-CM | POA: Insufficient documentation

## 2022-06-20 DIAGNOSIS — Z5111 Encounter for antineoplastic chemotherapy: Secondary | ICD-10-CM | POA: Diagnosis present

## 2022-06-20 DIAGNOSIS — R232 Flushing: Secondary | ICD-10-CM | POA: Diagnosis not present

## 2022-06-20 DIAGNOSIS — R1310 Dysphagia, unspecified: Secondary | ICD-10-CM | POA: Diagnosis not present

## 2022-06-20 DIAGNOSIS — E538 Deficiency of other specified B group vitamins: Secondary | ICD-10-CM | POA: Diagnosis not present

## 2022-06-20 DIAGNOSIS — C50912 Malignant neoplasm of unspecified site of left female breast: Secondary | ICD-10-CM | POA: Diagnosis present

## 2022-06-20 DIAGNOSIS — C7951 Secondary malignant neoplasm of bone: Secondary | ICD-10-CM | POA: Diagnosis present

## 2022-06-20 DIAGNOSIS — Z79899 Other long term (current) drug therapy: Secondary | ICD-10-CM | POA: Insufficient documentation

## 2022-06-20 LAB — CBC WITH DIFFERENTIAL/PLATELET
Abs Immature Granulocytes: 0.01 10*3/uL (ref 0.00–0.07)
Basophils Absolute: 0.1 10*3/uL (ref 0.0–0.1)
Basophils Relative: 2 %
Eosinophils Absolute: 0 10*3/uL (ref 0.0–0.5)
Eosinophils Relative: 1 %
HCT: 38 % (ref 36.0–46.0)
Hemoglobin: 13.2 g/dL (ref 12.0–15.0)
Immature Granulocytes: 0 %
Lymphocytes Relative: 34 %
Lymphs Abs: 1.1 10*3/uL (ref 0.7–4.0)
MCH: 35.3 pg — ABNORMAL HIGH (ref 26.0–34.0)
MCHC: 34.7 g/dL (ref 30.0–36.0)
MCV: 101.6 fL — ABNORMAL HIGH (ref 80.0–100.0)
Monocytes Absolute: 0.5 10*3/uL (ref 0.1–1.0)
Monocytes Relative: 14 %
Neutro Abs: 1.6 10*3/uL — ABNORMAL LOW (ref 1.7–7.7)
Neutrophils Relative %: 49 %
Platelets: 247 10*3/uL (ref 150–400)
RBC: 3.74 MIL/uL — ABNORMAL LOW (ref 3.87–5.11)
RDW: 13.5 % (ref 11.5–15.5)
WBC: 3.3 10*3/uL — ABNORMAL LOW (ref 4.0–10.5)
nRBC: 0 % (ref 0.0–0.2)

## 2022-06-20 LAB — COMPREHENSIVE METABOLIC PANEL
ALT: 46 U/L — ABNORMAL HIGH (ref 0–44)
AST: 41 U/L (ref 15–41)
Albumin: 4.1 g/dL (ref 3.5–5.0)
Alkaline Phosphatase: 58 U/L (ref 38–126)
Anion gap: 8 (ref 5–15)
BUN: 17 mg/dL (ref 6–20)
CO2: 26 mmol/L (ref 22–32)
Calcium: 9.5 mg/dL (ref 8.9–10.3)
Chloride: 107 mmol/L (ref 98–111)
Creatinine, Ser: 0.78 mg/dL (ref 0.44–1.00)
GFR, Estimated: >60 mL/min (ref >60)
Glucose, Bld: 114 mg/dL — ABNORMAL HIGH (ref 70–99)
Potassium: 3.7 mmol/L (ref 3.5–5.1)
Sodium: 141 mmol/L (ref 135–145)
Total Bilirubin: 0.6 mg/dL (ref 0.3–1.2)
Total Protein: 7.4 g/dL (ref 6.5–8.1)

## 2022-06-20 MED ORDER — DENOSUMAB 120 MG/1.7ML ~~LOC~~ SOLN
120.0000 mg | Freq: Once | SUBCUTANEOUS | Status: AC
  Administered 2022-06-20: 120 mg via SUBCUTANEOUS
  Filled 2022-06-20: qty 1.7

## 2022-06-20 MED ORDER — FULVESTRANT 250 MG/5ML IM SOSY
500.0000 mg | PREFILLED_SYRINGE | Freq: Once | INTRAMUSCULAR | Status: AC
  Administered 2022-06-20: 500 mg via INTRAMUSCULAR
  Filled 2022-06-20: qty 10

## 2022-06-20 NOTE — Progress Notes (Signed)
[place], [place] [address]   CLINIC:  Medical Oncology/Hematology  PCP:  Renee Rival, FNP [address] [place] / [place] [address] [phone]   REASON FOR VISIT:  Follow-up for metastatic breast cancer  PRIOR THERAPY: none  NGS Results: not done  CURRENT THERAPY: Palbociclib + Fulvestrant every 4 weeks  BRIEF ONCOLOGIC HISTORY:  Oncology History  Primary malignant neoplasm of breast with metastasis ([place])  07/06/2021 Initial Diagnosis   Metastatic breast cancer ([place])   07/18/2021 -  Chemotherapy   Patient is on Treatment Plan : BREAST Palbociclib + Fulvestrant q28d       CANCER STAGING:  Cancer Staging  No matching staging information was found for the patient.  INTERVAL HISTORY:  Ms. Wendy QUIZHPI, a 61 y.o. female, returns for routine follow-up and consideration for next cycle of chemotherapy. Ziah was last seen on 03/30/2022.  Due for cycle #13 of Faslodex today.   Overall, she tells me she has been feeling pretty well. Her fatigue had improved since switching from 150 mg to 100 mg Ibrance, but she reports she continues to have some fatigue. She reports hot flashes and poor sleep. Her swallowing has improved. She has not required hydrocodone since December. She continues to take Flexeril prn. Her tingling in her hands is stable.   Overall, she feels ready for next cycle of chemo today.    REVIEW OF SYSTEMS:  Review of Systems  Constitutional:  Positive for fatigue. Negative for appetite change.  HENT:   Negative for trouble swallowing (improved).   Respiratory:  Positive for cough and shortness of breath.   Gastrointestinal:  Positive for constipation.  Endocrine: Positive for hot flashes.  Musculoskeletal:  Positive for arthralgias (5/10 shoulders) and neck pain.  Neurological:  Positive for headaches and numbness (hands - stable).  Psychiatric/Behavioral:  Positive for sleep disturbance.    All other systems reviewed and are negative.   PAST MEDICAL/SURGICAL HISTORY:  Past Medical History:  Diagnosis Date   Allergy    Asthma    Asthma    diagnosed age 70   Bone metastasis 08/2020   was on prolia   Breast cancer ([place])    started chemo 05/05/2019. finished 08/2019   Gangrene ([place])    GERD (gastroesophageal reflux disease)    Hypertension    Hypertension    started bp meds 2008   Lymphedema of arm    left arm   Osteopenia    Shingles 04/20/2020   left side   Past Surgical History:  Procedure Laterality Date   BIOPSY  01/22/2022   Procedure: BIOPSY;  Surgeon: Montez Morita, Quillian Quince, MD;  [facility];  Service: Gastroenterology;;   Wilmon Pali RELEASE Bilateral 2006   ESOPHAGOGASTRODUODENOSCOPY (EGD) WITH PROPOFOL N/A 01/22/2022   Procedure: ESOPHAGOGASTRODUODENOSCOPY (EGD) WITH PROPOFOL;  Surgeon: Harvel Quale, MD;  Location: [facility];  Service: Gastroenterology;  Laterality: N/A;  205   hysteroscopy     age 13   mastectomy Bilateral 10/22/2019   pt had necrosis after 1st surgery, had another surgery to remove implants 12/10/2019   MOUTH SURGERY     TUBAL LIGATION     TUBAL LIGATION Bilateral 1998   WISDOM TOOTH EXTRACTION     early 30's    SOCIAL HISTORY:  Social History   Socioeconomic History   Marital status: Married    Spouse name: Not on file   Number of children: 2  Years of education: Not on file   Highest education level: Not on file  Occupational History   Occupation: Disability  Tobacco Use   Smoking status: Every Day    Packs/day: 0.50    Years: 40.00    Total pack years: 20.00    Types: Cigarettes   Smokeless tobacco: Never   Tobacco comments:    Smokes half a pack a day for 40 years.   Vaping Use   Vaping Use: Never used  Substance and Sexual Activity   Alcohol use: Never   Drug use: Never   Sexual activity: Yes  Other Topics Concern   Not on file  Social History Narrative   Lives with her  husband.    Social Determinants of Health   Financial Resource Strain: Low Risk  (07/03/2021)   Overall Financial Resource Strain (CARDIA)    Difficulty of Paying Living Expenses: Not hard at all  Food Insecurity: No Food Insecurity (07/03/2021)   Hunger Vital Sign    Worried About Running Out of Food in the Last Year: Never true    Ran Out of Food in the Last Year: Never true  Transportation Needs: No Transportation Needs (07/03/2021)   PRAPARE - Hydrologist (Medical): No    Lack of Transportation (Non-Medical): No  Physical Activity: Insufficiently Active (07/03/2021)   Exercise Vital Sign    Days of Exercise per Week: 5 days    Minutes of Exercise per Session: 10 min  Stress: No Stress Concern Present (07/03/2021)   [place]    Feeling of Stress : Only a little  Social Connections: Moderately Isolated (07/03/2021)   Social Connection and Isolation Panel [NHANES]    Frequency of Communication with Friends and Family: Three times a week    Frequency of Social Gatherings with Friends and Family: Once a week    Attends Religious Services: Never    Marine scientist or Organizations: No    Attends Archivist Meetings: Never    Marital Status: Married  Human resources officer Violence: Not At Risk (07/03/2021)   Humiliation, Afraid, Rape, and Kick questionnaire    Fear of Current or Ex-Partner: No    Emotionally Abused: No    Physically Abused: No    Sexually Abused: No    FAMILY HISTORY:  Family History  Problem Relation Age of Onset   Leukemia Mother    Breast cancer Sister    Diabetes Maternal Uncle    Hyperlipidemia Maternal Uncle    Diabetes Mellitus II Maternal Grandmother    Heart disease Maternal Grandfather    Diabetes Maternal Grandfather    Breast cancer Cousin    Breast cancer Half-Sister    Osteoporosis Half-Sister    Lupus Half-Sister    Lupus Half-Sister     Osteoporosis Half-Sister    Cancer - Lung Neg Hx    Colon cancer Neg Hx     CURRENT MEDICATIONS:  Current Outpatient Medications  Medication Sig Dispense Refill   amLODipine (NORVASC) 5 MG tablet Take 1 tablet (5 mg total) by mouth daily. 90 tablet 1   Ascorbic Acid (VITAMIN C) 1000 MG tablet Take 1,000 mg by mouth daily.     aspirin 81 MG EC tablet Take 81 mg by mouth daily.     butalbital-acetaminophen-caffeine (FIORICET) 50-325-40 MG tablet Take 1 tablet by mouth every 4 (four) hours as needed for headache. 30 tablet 0   Calcium-Magnesium-Vitamin D (  CALCIUM 1200+D3 PO) Take 1 tablet by mouth daily.     cetirizine-pseudoephedrine (ZYRTEC-D) 5-120 MG tablet Take 1 tablet by mouth daily.     CINNAMON PO Take 1,000 mg by mouth daily.     cyanocobalamin 1000 MCG tablet Take 1 tablet (1,000 mcg total) by mouth daily. (Patient taking differently: Take 1,000 mcg by mouth every other day.) 30 tablet 6   cyclobenzaprine (FLEXERIL) 10 MG tablet Take 1 tablet (10 mg total) by mouth 2 (two) times daily as needed for muscle spasms. 60 tablet 5   denosumab (XGEVA) 120 MG/1.7ML SOLN injection Inject 120 mg into the skin every 30 (thirty) days.     fulvestrant (FASLODEX) 250 MG/5ML injection Inject 500 mg into the muscle every 30 (thirty) days. One injection each buttock over 1-2 minutes. Warm prior to use.     furosemide (LASIX) 20 MG tablet Take 1 tablet (20 mg total) by mouth daily as needed. 30 tablet 0   lansoprazole (PREVACID) 30 MG capsule Take 1 capsule (30 mg total) by mouth in the morning and at bedtime. 180 capsule 3   Misc. Devices MISC Please provide patient with left arm compression sleeve. Diagnosis metastatic breast cancer, bilateral mastectomy, left arm lymphedema 1 each 0   NAPHCON-A 0.025-0.3 % ophthalmic solution Place 2 drops into both eyes in the morning and at bedtime.     palbociclib (IBRANCE) 100 MG tablet TAKE 1 TABLET BY MOUTH 1 TIME A DAY ON DAYS 1 TO 21 OF A 28 DAY CYCLE 21  tablet 4   PROAIR HFA 108 (90 Base) MCG/ACT inhaler Inhale 2 puffs into the lungs every 4 (four) hours as needed for wheezing or shortness of breath.     No current facility-administered medications for this visit.    ALLERGIES:  Allergies  Allergen Reactions   Augmentin [Amoxicillin-Pot Clavulanate] Anaphylaxis   Ciprofloxacin Anaphylaxis   Claritin [Loratadine] Anaphylaxis   Gabapentin Anaphylaxis   Keflex [Cephalexin] Anaphylaxis   Levofloxacin Anaphylaxis   Lisinopril Swelling   Lisinopril Anaphylaxis   Metoprolol Swelling   Metoprolol Anaphylaxis   Nexium [Esomeprazole] Anaphylaxis   Sulfa Antibiotics Hives and Swelling   Tetracyclines & Related Anaphylaxis   Ceftin [Cefuroxime Axetil] Hives   Chromium Other (See Comments)    headache   Oxycodone Itching   Sulfa Antibiotics     Possible Reaction    Latex Rash    itching   Nickel Rash    itching    PHYSICAL EXAM:  Performance status (ECOG): 0 - Asymptomatic  There were no vitals filed for this visit. Wt Readings from Last 3 Encounters:  05/23/22 184 lb 3.2 oz (83.6 kg)  04/18/22 180 lb (81.6 kg)  04/04/22 181 lb 9.6 oz (82.4 kg)   Physical Exam Vitals reviewed.  Constitutional:      Appearance: Normal appearance. She is obese.  Cardiovascular:     Rate and Rhythm: Normal rate and regular rhythm.     Pulses: Normal pulses.     Heart sounds: Normal heart sounds.  Pulmonary:     Effort: Pulmonary effort is normal.     Breath sounds: Normal breath sounds.  Lymphadenopathy:     Cervical: No cervical adenopathy.     Right cervical: No superficial, deep or posterior cervical adenopathy.    Left cervical: No superficial, deep or posterior cervical adenopathy.     Upper Body:     Right upper body: No supraclavicular or axillary adenopathy.     Left upper body: No  supraclavicular or axillary adenopathy.  Neurological:     General: No focal deficit present.     Mental Status: She is alert and oriented to  person, place, and time.  Psychiatric:        Mood and Affect: Mood normal.        Behavior: Behavior normal.     LABORATORY DATA:  I have reviewed the labs as listed.     Latest Ref Rng & Units 06/20/2022   12:52 PM 05/23/2022   12:35 PM 04/25/2022   12:49 PM  CBC  WBC 4.0 - 10.5 K/uL 3.3  3.1  2.7   Hemoglobin 12.0 - 15.0 g/dL 13.2  13.2  13.1   Hematocrit 36.0 - 46.0 % 38.0  38.3  37.9   Platelets 150 - 400 K/uL 247  240  240       Latest Ref Rng & Units 05/23/2022   12:35 PM 04/25/2022   12:49 PM 03/28/2022    1:37 PM  CMP  Glucose 70 - 99 mg/dL 79  87  101   BUN 6 - 20 mg/dL '14  12  21   ' Creatinine 0.44 - 1.00 mg/dL 0.75  1.11  0.71   Sodium 135 - 145 mmol/L 142  141  139   Potassium 3.5 - 5.1 mmol/L 3.7  3.6  3.8   Chloride 98 - 111 mmol/L 112  109  106   CO2 22 - 32 mmol/L '25  25  22   ' Calcium 8.9 - 10.3 mg/dL 9.1  9.3  10.1   Total Protein 6.5 - 8.1 g/dL 7.1  7.5  8.0   Total Bilirubin 0.3 - 1.2 mg/dL 0.4  0.5  0.5   Alkaline Phos 38 - 126 U/L 54  58  59   AST 15 - 41 U/L '30  28  24   ' ALT 0 - 44 U/L 36  28  27     DIAGNOSTIC IMAGING:  I have independently reviewed the scans and discussed with the patient. No results found.   ASSESSMENT:  1.  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 - Started on Ibrance and Faslodex in December 2021 for metastatic disease. - PET scan on 08/10/2021 with no evidence of hypermetabolic metastatic disease.  Widespread scattered sclerotic bone metastasis in the spine and pelvis are stable and not metabolically active.  No lymphadenopathy.  Asymmetric muscular uptake in the left lower scalene muscles and in the intercostal muscles of the ventral upper left chest wall without discrete mass correlate on the CT images,  favoring activity related uptake. Leslee Home was dose reduced to 100 mg 3 weeks on/1 week off on 10/11/2021 due to fatigue.  2.  Social/family history: - She is a retired Building control surveyor.  She recently moved back from [place]. - Younger sister had breast cancer in her 15s.  Mother died of leukemia at age 30.  Paternal cousin also had breast cancer.  3.  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  She underwent immediate implants which were later removed on 12/10/2019 secondary to infection and necrosis. - XRT to the chest wall and regional lymph nodes, [address], from 02/29/2020 through 04/12/2020 - She was treated with adjuvant anastrozole which was started 12/09/2019 and was found to have metastatic disease in November 2021. - Previous genetic testing was reportedly negative and [place].   PLAN:  1.  Metastatic breast cancer to the bones, ER/PR positive, HER2 negative: - CT CAP (03/21/2022): No evidence of lymphadenopathy or soft tissue metastatic disease. - Bone scan (03/21/2022): Increased uptake in the thoracolumbar spine and right anterolateral fifth rib.  No previous comparison scan. - She denies any new onset pains.  No infections reported. - Reviewed labs today which showed ALT elevated at 46.  White count is low at 3.3 with ANC of 1.6.  Hemoglobin and platelet count is normal. - Continue Ibrance 100 mg 3 weeks on/1 week off.  Fatigue is better controlled with the lower dose. - She asked about her prognosis and how long she should remain on Ibrance.  I have recommended her to continue Ibrance and  Faslodex until further progression. - Continue monthly Faslodex.  Recommend RTC 3 months with repeat labs and tumor markers.  We will also plan to repeat CT CAP and bone scan prior to next visit.  2.  Peripheral neuropathy: - She has constant tingling and numbness in the left hand and right hand which is stable.  No neuropathic pains.  We will consider Lyrica if she develops any pains.  3.  Bone metastatic disease: - Continue calcium and vitamin D supplements.  Calcium today is 9.5. - Continue denosumab today and monthly.  4.  Hot flashes: - Hot flashes are stable.  No pharmacological intervention necessary.  5.  Dysphagia: - EGD was done on 01/22/2022: Biopsies showed nonspecific reactive gastropathy. - She reports improvement in dysphagia.  6.  Body pains: - Continue Flexeril as needed. - She has not required hydrocodone in the last few months.   Orders placed this encounter:  No orders of the defined types were placed in this encounter.    Derek Jack, MD [place] [phone]   I, Thana Ates, am acting as a scribe for Dr. Derek Jack.  I, Derek Jack MD, have reviewed the above documentation for accuracy and completeness, and I agree with the above.

## 2022-06-20 NOTE — Patient Instructions (Addendum)
[place] at [facility] Discharge Instructions  You were seen and examined today by Dr. Delton Coombes.  Dr. Delton Coombes discussed your most recent lab work which is stable. Your tumor markers and still pending. If the tumor markers are stable, your next scan will be in 3 months. If the tumor markers are increasing, your scan will be scheduled sooner.  Follow-up as scheduled.    Thank you for choosing [place] at [facility] to provide your oncology and hematology care.  To afford each patient quality time with our provider, please arrive at least 15 minutes before your scheduled appointment time.   If you have a lab appointment with the [facility] please come in thru the Main Entrance and check in at the main information desk.  You need to re-schedule your appointment should you arrive 10 or more minutes late.  We strive to give you quality time with our providers, and arriving late affects you and other patients whose appointments are after yours.  Also, if you no show three or more times for appointments you may be dismissed from the clinic at the providers discretion.     Again, thank you for choosing [facility].  Our hope is that these requests will decrease the amount of time that you wait before being seen by our physicians.       _____________________________________________________________  Should you have questions after your visit to [facility], please contact our office at [phone] and follow the prompts.  Our office hours are 8:00 a.m. and 4:30 p.m. Monday - Friday.  Please note that voicemails left after 4:00 p.m. may not be returned until the following business day.  We are closed weekends and major holidays.  You do have access to a nurse 24-7, just call the main number to the clinic [phone] and do not press any options, hold on the line and a nurse will answer the phone.    For prescription  refill requests, have your pharmacy contact our office and allow 72 hours.    Due to Covid, you will need to wear a mask upon entering the hospital. If you do not have a mask, a mask will be given to you at the Main Entrance upon arrival. For doctor visits, patients may have 1 support person age 12 or older with them. For treatment visits, patients can not have anyone with them due to social distancing guidelines and our immunocompromised population.

## 2022-06-21 LAB — CANCER ANTIGEN 27.29: CA 27.29: 29.5 U/mL (ref 0.0–38.6)

## 2022-06-22 LAB — CANCER ANTIGEN 15-3: CA 15-3: 29.3 U/mL — ABNORMAL HIGH (ref 0.0–25.0)

## 2022-07-05 ENCOUNTER — Telehealth: Payer: Self-pay

## 2022-07-05 ENCOUNTER — Other Ambulatory Visit: Payer: Self-pay

## 2022-07-05 MED ORDER — AMLODIPINE BESYLATE 5 MG PO TABS: 5.0000 mg | ORAL_TABLET | Freq: Every day | ORAL | 2 refills | Status: DC

## 2022-07-05 NOTE — Telephone Encounter (Signed)
Rx needed refills not pa refills sent to pharmacy pt aware

## 2022-07-05 NOTE — Telephone Encounter (Signed)
Patient called said the pharmacy was faxing over prior authorization on amLODipine (NORVASC) 5 MG tablet    Pharmacy: [address] Geryl Councilman

## 2022-07-18 ENCOUNTER — Inpatient Hospital Stay (HOSPITAL_COMMUNITY): Payer: Medicaid Other | Attending: Hematology

## 2022-07-18 ENCOUNTER — Other Ambulatory Visit (HOSPITAL_COMMUNITY): Payer: Self-pay | Admitting: *Deleted

## 2022-07-18 ENCOUNTER — Inpatient Hospital Stay (HOSPITAL_COMMUNITY): Payer: Medicaid Other

## 2022-07-18 VITALS — BP 116/68 | HR 81 | Temp 97.1°F | Resp 16

## 2022-07-18 DIAGNOSIS — C50912 Malignant neoplasm of unspecified site of left female breast: Secondary | ICD-10-CM | POA: Insufficient documentation

## 2022-07-18 DIAGNOSIS — C7951 Secondary malignant neoplasm of bone: Secondary | ICD-10-CM | POA: Diagnosis not present

## 2022-07-18 DIAGNOSIS — Z923 Personal history of irradiation: Secondary | ICD-10-CM | POA: Insufficient documentation

## 2022-07-18 DIAGNOSIS — Z170 Estrogen receptor positive status [ER+]: Secondary | ICD-10-CM | POA: Diagnosis not present

## 2022-07-18 DIAGNOSIS — Z5111 Encounter for antineoplastic chemotherapy: Secondary | ICD-10-CM | POA: Insufficient documentation

## 2022-07-18 DIAGNOSIS — C50919 Malignant neoplasm of unspecified site of unspecified female breast: Secondary | ICD-10-CM

## 2022-07-18 LAB — CBC WITH DIFFERENTIAL/PLATELET
Abs Immature Granulocytes: 0.02 10*3/uL (ref 0.00–0.07)
Basophils Absolute: 0.1 10*3/uL (ref 0.0–0.1)
Basophils Relative: 2 %
Eosinophils Absolute: 0.1 10*3/uL (ref 0.0–0.5)
Eosinophils Relative: 2 %
HCT: 37.7 % (ref 36.0–46.0)
Hemoglobin: 13.1 g/dL (ref 12.0–15.0)
Immature Granulocytes: 1 %
Lymphocytes Relative: 35 %
Lymphs Abs: 1 10*3/uL (ref 0.7–4.0)
MCH: 35.6 pg — ABNORMAL HIGH (ref 26.0–34.0)
MCHC: 34.7 g/dL (ref 30.0–36.0)
MCV: 102.4 fL — ABNORMAL HIGH (ref 80.0–100.0)
Monocytes Absolute: 0.4 10*3/uL (ref 0.1–1.0)
Monocytes Relative: 15 %
Neutro Abs: 1.4 10*3/uL — ABNORMAL LOW (ref 1.7–7.7)
Neutrophils Relative %: 45 %
Platelets: 264 10*3/uL (ref 150–400)
RBC: 3.68 MIL/uL — ABNORMAL LOW (ref 3.87–5.11)
RDW: 13.6 % (ref 11.5–15.5)
WBC: 3 10*3/uL — ABNORMAL LOW (ref 4.0–10.5)
nRBC: 0 % (ref 0.0–0.2)

## 2022-07-18 LAB — COMPREHENSIVE METABOLIC PANEL
ALT: 59 U/L — ABNORMAL HIGH (ref 0–44)
AST: 51 U/L — ABNORMAL HIGH (ref 15–41)
Albumin: 4.1 g/dL (ref 3.5–5.0)
Alkaline Phosphatase: 52 U/L (ref 38–126)
Anion gap: 7 (ref 5–15)
BUN: 13 mg/dL (ref 6–20)
CO2: 26 mmol/L (ref 22–32)
Calcium: 9.4 mg/dL (ref 8.9–10.3)
Chloride: 108 mmol/L (ref 98–111)
Creatinine, Ser: 0.75 mg/dL (ref 0.44–1.00)
GFR, Estimated: >60 mL/min (ref >60)
Glucose, Bld: 119 mg/dL — ABNORMAL HIGH (ref 70–99)
Potassium: 3.7 mmol/L (ref 3.5–5.1)
Sodium: 141 mmol/L (ref 135–145)
Total Bilirubin: 0.6 mg/dL (ref 0.3–1.2)
Total Protein: 7.4 g/dL (ref 6.5–8.1)

## 2022-07-18 MED ORDER — FULVESTRANT 250 MG/5ML IM SOSY
500.0000 mg | PREFILLED_SYRINGE | Freq: Once | INTRAMUSCULAR | Status: AC
  Administered 2022-07-18: 500 mg via INTRAMUSCULAR
  Filled 2022-07-18: qty 10

## 2022-07-18 MED ORDER — PALBOCICLIB 100 MG PO TABS: ORAL_TABLET | ORAL | 4 refills | Status: DC

## 2022-07-18 MED ORDER — DENOSUMAB 120 MG/1.7ML ~~LOC~~ SOLN
120.0000 mg | Freq: Once | SUBCUTANEOUS | Status: AC
  Administered 2022-07-18: 120 mg via SUBCUTANEOUS
  Filled 2022-07-18: qty 1.7

## 2022-07-18 NOTE — Progress Notes (Signed)
Wendy Zamora presents today for Faslodex and Xgeva injection per the provider's orders.  Patient is taking Calcium and Vitamin D, No prior or upcoming dental work, and no jaw pain.  Stable during administration without incident; injection site WNL; see MAR for injection details.  Patient tolerated procedure well and without incident.  No questions or complaints noted at this time.

## 2022-07-18 NOTE — Patient Instructions (Signed)
[facility]  Discharge Instructions: Thank you for choosing [facility] to provide your oncology and hematology care.  If you have a lab appointment with the [facility], please come in thru the Main Entrance and check in at the main information desk.  Wear comfortable clothing and clothing appropriate for easy access to any Portacath or PICC line.   We strive to give you quality time with your provider. You may need to reschedule your appointment if you arrive late (15 or more minutes).  Arriving late affects you and other patients whose appointments are after yours.  Also, if you miss three or more appointments without notifying the office, you may be dismissed from the clinic at the provider's discretion.      For prescription refill requests, have your pharmacy contact our office and allow 72 hours for refills to be completed.    Today you received the following chemotherapy and/or immunotherapy agents Faslodex and Xgeva      To help prevent nausea and vomiting after your treatment, we encourage you to take your nausea medication as directed.  BELOW ARE SYMPTOMS THAT SHOULD BE REPORTED IMMEDIATELY: *FEVER GREATER THAN 100.4 F (38 C) OR HIGHER *CHILLS OR SWEATING *NAUSEA AND VOMITING THAT IS NOT CONTROLLED WITH YOUR NAUSEA MEDICATION *UNUSUAL SHORTNESS OF BREATH *UNUSUAL BRUISING OR BLEEDING *URINARY PROBLEMS (pain or burning when urinating, or frequent urination) *BOWEL PROBLEMS (unusual diarrhea, constipation, pain near the anus) TENDERNESS IN MOUTH AND THROAT WITH OR WITHOUT PRESENCE OF ULCERS (sore throat, sores in mouth, or a toothache) UNUSUAL RASH, SWELLING OR PAIN  UNUSUAL VAGINAL DISCHARGE OR ITCHING   Items with * indicate a potential emergency and should be followed up as soon as possible or go to the Emergency Department if any problems should occur.  Please show the CHEMOTHERAPY ALERT CARD or IMMUNOTHERAPY ALERT CARD at check-in to the  Emergency Department and triage nurse.  Should you have questions after your visit or need to cancel or reschedule your appointment, please contact [facility] [phone]  and follow the prompts.  Office hours are 8:00 a.m. to 4:30 p.m. Monday - Friday. Please note that voicemails left after 4:00 p.m. may not be returned until the following business day.  We are closed weekends and major holidays. You have access to a nurse at all times for urgent questions. Please call the main number to the clinic [phone] and follow the prompts.  For any non-urgent questions, you may also contact your provider using MyChart. We now offer e-Visits for anyone 7 and older to request care online for non-urgent symptoms. For details visit [email or web address].   Also download the MyChart app! Go to the app store, search "MyChart", open the app, select [facility], and log in with your MyChart username and password.  Masks are optional in the cancer centers. If you would like for your care team to wear a mask while they are taking care of you, please let them know. For doctor visits, patients may have with them one support person who is at least 60 years old. At this time, visitors are not allowed in the infusion area.

## 2022-07-22 ENCOUNTER — Other Ambulatory Visit: Payer: Self-pay

## 2022-07-30 ENCOUNTER — Other Ambulatory Visit: Payer: Self-pay

## 2022-08-09 ENCOUNTER — Other Ambulatory Visit: Payer: Self-pay | Admitting: Hematology

## 2022-08-10 ENCOUNTER — Other Ambulatory Visit: Payer: Self-pay

## 2022-08-12 ENCOUNTER — Other Ambulatory Visit: Payer: Self-pay | Admitting: *Deleted

## 2022-08-12 ENCOUNTER — Encounter: Payer: Self-pay | Admitting: Hematology

## 2022-08-12 NOTE — Telephone Encounter (Signed)
Refill for Ibrance approved.  Per last ovn, patient tolerating and is to continue therapy.

## 2022-08-15 ENCOUNTER — Inpatient Hospital Stay: Payer: Medicaid Other

## 2022-08-15 ENCOUNTER — Inpatient Hospital Stay (HOSPITAL_COMMUNITY): Payer: Medicaid Other | Attending: Hematology

## 2022-08-15 ENCOUNTER — Other Ambulatory Visit: Payer: Self-pay

## 2022-08-15 VITALS — BP 139/68 | HR 87 | Temp 97.2°F | Resp 18

## 2022-08-15 DIAGNOSIS — Z5111 Encounter for antineoplastic chemotherapy: Secondary | ICD-10-CM | POA: Insufficient documentation

## 2022-08-15 DIAGNOSIS — Z170 Estrogen receptor positive status [ER+]: Secondary | ICD-10-CM | POA: Insufficient documentation

## 2022-08-15 DIAGNOSIS — C7951 Secondary malignant neoplasm of bone: Secondary | ICD-10-CM | POA: Insufficient documentation

## 2022-08-15 DIAGNOSIS — C50919 Malignant neoplasm of unspecified site of unspecified female breast: Secondary | ICD-10-CM

## 2022-08-15 DIAGNOSIS — C50912 Malignant neoplasm of unspecified site of left female breast: Secondary | ICD-10-CM | POA: Diagnosis present

## 2022-08-15 DIAGNOSIS — F17210 Nicotine dependence, cigarettes, uncomplicated: Secondary | ICD-10-CM | POA: Diagnosis not present

## 2022-08-15 LAB — CBC WITH DIFFERENTIAL/PLATELET
Abs Immature Granulocytes: 0.02 10*3/uL (ref 0.00–0.07)
Basophils Absolute: 0.1 10*3/uL (ref 0.0–0.1)
Basophils Relative: 2 %
Eosinophils Absolute: 0 10*3/uL (ref 0.0–0.5)
Eosinophils Relative: 1 %
HCT: 37.7 % (ref 36.0–46.0)
Hemoglobin: 13.2 g/dL (ref 12.0–15.0)
Immature Granulocytes: 1 %
Lymphocytes Relative: 36 %
Lymphs Abs: 1.2 10*3/uL (ref 0.7–4.0)
MCH: 35.7 pg — ABNORMAL HIGH (ref 26.0–34.0)
MCHC: 35 g/dL (ref 30.0–36.0)
MCV: 101.9 fL — ABNORMAL HIGH (ref 80.0–100.0)
Monocytes Absolute: 0.5 10*3/uL (ref 0.1–1.0)
Monocytes Relative: 16 %
Neutro Abs: 1.5 10*3/uL — ABNORMAL LOW (ref 1.7–7.7)
Neutrophils Relative %: 44 %
Platelets: 249 10*3/uL (ref 150–400)
RBC: 3.7 MIL/uL — ABNORMAL LOW (ref 3.87–5.11)
RDW: 13.8 % (ref 11.5–15.5)
WBC: 3.3 10*3/uL — ABNORMAL LOW (ref 4.0–10.5)
nRBC: 0 % (ref 0.0–0.2)

## 2022-08-15 LAB — COMPREHENSIVE METABOLIC PANEL
ALT: 55 U/L — ABNORMAL HIGH (ref 0–44)
AST: 43 U/L — ABNORMAL HIGH (ref 15–41)
Albumin: 4 g/dL (ref 3.5–5.0)
Alkaline Phosphatase: 56 U/L (ref 38–126)
Anion gap: 7 (ref 5–15)
BUN: 14 mg/dL (ref 6–20)
CO2: 24 mmol/L (ref 22–32)
Calcium: 9.4 mg/dL (ref 8.9–10.3)
Chloride: 110 mmol/L (ref 98–111)
Creatinine, Ser: 0.81 mg/dL (ref 0.44–1.00)
GFR, Estimated: >60 mL/min (ref >60)
Glucose, Bld: 117 mg/dL — ABNORMAL HIGH (ref 70–99)
Potassium: 3.6 mmol/L (ref 3.5–5.1)
Sodium: 141 mmol/L (ref 135–145)
Total Bilirubin: 0.3 mg/dL (ref 0.3–1.2)
Total Protein: 7.3 g/dL (ref 6.5–8.1)

## 2022-08-15 MED ORDER — FULVESTRANT 250 MG/5ML IM SOSY
500.0000 mg | PREFILLED_SYRINGE | Freq: Once | INTRAMUSCULAR | Status: AC
  Administered 2022-08-15: 500 mg via INTRAMUSCULAR
  Filled 2022-08-15: qty 10

## 2022-08-15 MED ORDER — DENOSUMAB 120 MG/1.7ML ~~LOC~~ SOLN
120.0000 mg | Freq: Once | SUBCUTANEOUS | Status: AC
  Administered 2022-08-15: 120 mg via SUBCUTANEOUS
  Filled 2022-08-15: qty 1.7

## 2022-08-15 MED ORDER — CYCLOBENZAPRINE HCL 10 MG PO TABS
10.0000 mg | ORAL_TABLET | Freq: Two times a day (BID) | ORAL | 5 refills | Status: DC | PRN
Start: 2022-08-15 — End: 2023-01-14

## 2022-08-15 NOTE — Patient Instructions (Signed)
[facility]  Discharge Instructions: Thank you for choosing [facility] to provide your oncology and hematology care.  If you have a lab appointment with the [facility], please come in thru the Main Entrance and check in at the main information desk.  Wear comfortable clothing and clothing appropriate for easy access to any Portacath or PICC line.   We strive to give you quality time with your provider. You may need to reschedule your appointment if you arrive late (15 or more minutes).  Arriving late affects you and other patients whose appointments are after yours.  Also, if you miss three or more appointments without notifying the office, you may be dismissed from the clinic at the provider's discretion.      For prescription refill requests, have your pharmacy contact our office and allow 72 hours for refills to be completed.    Today you received the following Xgeva and Faslodex, return as scheduled.   To help prevent nausea and vomiting after your treatment, we encourage you to take your nausea medication as directed.  BELOW ARE SYMPTOMS THAT SHOULD BE REPORTED IMMEDIATELY: *FEVER GREATER THAN 100.4 F (38 C) OR HIGHER *CHILLS OR SWEATING *NAUSEA AND VOMITING THAT IS NOT CONTROLLED WITH YOUR NAUSEA MEDICATION *UNUSUAL SHORTNESS OF BREATH *UNUSUAL BRUISING OR BLEEDING *URINARY PROBLEMS (pain or burning when urinating, or frequent urination) *BOWEL PROBLEMS (unusual diarrhea, constipation, pain near the anus) TENDERNESS IN MOUTH AND THROAT WITH OR WITHOUT PRESENCE OF ULCERS (sore throat, sores in mouth, or a toothache) UNUSUAL RASH, SWELLING OR PAIN  UNUSUAL VAGINAL DISCHARGE OR ITCHING   Items with * indicate a potential emergency and should be followed up as soon as possible or go to the Emergency Department if any problems should occur.  Please show the CHEMOTHERAPY ALERT CARD or IMMUNOTHERAPY ALERT CARD at check-in to the Emergency Department  and triage nurse.  Should you have questions after your visit or need to cancel or reschedule your appointment, please contact [place] [phone]  and follow the prompts.  Office hours are 8:00 a.m. to 4:30 p.m. Monday - Friday. Please note that voicemails left after 4:00 p.m. may not be returned until the following business day.  We are closed weekends and major holidays. You have access to a nurse at all times for urgent questions. Please call the main number to the clinic [phone] and follow the prompts.  For any non-urgent questions, you may also contact your provider using MyChart. We now offer e-Visits for anyone 2 and older to request care online for non-urgent symptoms. For details visit [email or web address].   Also download the MyChart app! Go to the app store, search "MyChart", open the app, select [facility], and log in with your MyChart username and password.  Masks are optional in the cancer centers. If you would like for your care team to wear a mask while they are taking care of you, please let them know. You may have one support person who is at least 60 years old accompany you for your appointments.

## 2022-08-15 NOTE — Progress Notes (Signed)
Patient tolerated Faslodex injection with no complaints voiced. Bilateral sites clean and dry with no bruising or swelling noted. Band aids applied. See MAR for details. Patient stable during and after injections.  Patient taking calcium as directed. Denied tooth, jaw, and leg pain. No recent or upcoming dental visits. Labs reviewed. Patient tolerated injection with no complaints voiced. See MAR for details. Patient stable during and after injection. Site clean and dry with no bruising or swelling noted. Band aid applied. Vss with discharge and left in satisfactory condition with no s/s of distress.  

## 2022-08-20 ENCOUNTER — Ambulatory Visit: Payer: Medicaid Other | Admitting: Nurse Practitioner

## 2022-09-01 ENCOUNTER — Other Ambulatory Visit: Payer: Self-pay | Admitting: Hematology

## 2022-09-01 DIAGNOSIS — C50919 Malignant neoplasm of unspecified site of unspecified female breast: Secondary | ICD-10-CM

## 2022-09-03 ENCOUNTER — Ambulatory Visit: Payer: Medicaid Other | Admitting: Nurse Practitioner

## 2022-09-03 ENCOUNTER — Other Ambulatory Visit: Payer: Self-pay

## 2022-09-03 ENCOUNTER — Encounter: Payer: Self-pay | Admitting: Nurse Practitioner

## 2022-09-03 VITALS — BP 119/76 | HR 82 | Ht 64.0 in | Wt 188.0 lb

## 2022-09-03 DIAGNOSIS — F17200 Nicotine dependence, unspecified, uncomplicated: Secondary | ICD-10-CM | POA: Diagnosis not present

## 2022-09-03 DIAGNOSIS — I10 Essential (primary) hypertension: Secondary | ICD-10-CM | POA: Diagnosis not present

## 2022-09-03 DIAGNOSIS — E782 Mixed hyperlipidemia: Secondary | ICD-10-CM | POA: Diagnosis not present

## 2022-09-03 DIAGNOSIS — E669 Obesity, unspecified: Secondary | ICD-10-CM | POA: Diagnosis not present

## 2022-09-03 DIAGNOSIS — Z23 Encounter for immunization: Secondary | ICD-10-CM

## 2022-09-03 NOTE — Assessment & Plan Note (Signed)
Smokes about 0.5 pack/day  Asked about quitting: confirms that he/she currently smokes cigarettes Advise to quit smoking: Educated about QUITTING to reduce the risk of cancer, cardio and cerebrovascular disease. Assess willingness: Unwilling to quit at this time, but is working on cutting back. Assist with counseling and pharmacotherapy: Counseled for 5 minutes and literature provided. Arrange for follow up: follow up in 3 months and continue to offer help. 

## 2022-09-03 NOTE — Assessment & Plan Note (Signed)
BP Readings from Last 3 Encounters:  09/03/22 119/76  08/15/22 139/68  07/18/22 116/68  Chronic medical condition well-controlled on amlodipine 5 mg daily Continue current medication DASH diet advised engage in regular moderate exercises at least 150 minutes weekly as tolerated Follow-up in 6 months

## 2022-09-03 NOTE — Patient Instructions (Addendum)
  Please get your TDAP vaccine at the pharmacy    It is important that you exercise regularly at least 30 minutes 5 times a week.  Think about what you will eat, plan ahead. Choose " clean, green, fresh or frozen" over canned, processed or packaged foods which are more sugary, salty and fatty. 70 to 75% of food eaten should be vegetables and fruit. Three meals at set times with snacks allowed between meals, but they must be fruit or vegetables. Aim to eat over a 12 hour period , example 7 am to 7 pm, and STOP after  your last meal of the day. Drink water,generally about 64 ounces per day, no other drink is as healthy. Fruit juice is best enjoyed in a healthy way, by EATING the fruit.  Thanks for choosing [facility], we consider it a privelige to serve you.

## 2022-09-03 NOTE — Assessment & Plan Note (Addendum)
The 10-year ASCVD risk score (Arnett DK, et al., 2019) is: 8.8% does not want medications for lowering lipid at this time. Will check labs at next visit . Risk of CVA, MI discussed she verbalized understanding   Eat a healthy diet, including lots of fruits and vegetables. Avoid foods with a lot of saturated and trans fats, such as red meat, butter, fried foods and cheese . Maintain a healthy weight.  Smoking cessation education completed.   Lab Results  Component Value Date   CHOL 228 (H) 04/12/2022   HDL 55 04/12/2022   LDLCALC 147 (H) 04/12/2022   TRIG 143 04/12/2022   CHOLHDL 4.1 04/12/2022

## 2022-09-03 NOTE — Assessment & Plan Note (Signed)
Wt Readings from Last 3 Encounters:  09/03/22 188 lb (85.3 kg)  06/20/22 184 lb 12.8 oz (83.8 kg)  05/23/22 184 lb 3.2 oz (83.6 kg)  Patient counseled on low-carb diet encouraged to engage in regular moderate exercises at least 150 minutes weekly as tolerated

## 2022-09-03 NOTE — Progress Notes (Signed)
Wendy Zamora     MRN: [id number]      DOB: 07/10/1961   HPI Wendy Zamora with past medical history of hypertension, migraine, asthma, GERD, osteoporosis, primary malignant neoplasm of breast with metastasis is here for follow up and re-evaluation of chronic medical conditions, medication management and review of any available recent lab   Due for Tdap vaccine need for vaccine discussed with patient patient encouraged to get the vaccine at the pharmacy.  Flu vaccine given in the office today  Hypertension.  Currently on amlodipine 5 mg daily, Lasix 20 mg daily as needed for swelling.  Patient denies dizziness, headache.   Hyperlipidemia.  Currently not on medication LDL goal is less than 100 due to being a current smoker and having hypertension.    ROS Denies recent fever or chills. Denies sinus pressure, nasal congestion, ear pain or sore throat. Denies chest congestion, productive cough or wheezing. Denies chest pains, palpitations and leg swelling Denies abdominal pain, nausea, vomiting,diarrhea or constipation.   Denies dysuria, frequency, hesitancy or incontinence. Denies headaches, seizures, numbness, or tingling. Denies depression, anxiety or insomnia.    PE  BP 119/76 (BP Location: Right Arm, Patient Position: Sitting, Cuff Size: Large)   Pulse 82   Ht '5\' 4"'$  (1.626 m)   Wt 188 lb (85.3 kg)   LMP 06/08/2012   SpO2 95%   BMI 32.27 kg/m   Patient alert and oriented and in no cardiopulmonary distress.   Chest: Clear to auscultation bilaterally.  CVS: S1, S2 no murmurs, no S3.Regular rate.  ABD: Soft non tender.   Ext: No edema  MS: Adequate ROM spine, shoulders, hips and knees.   Psych: Good eye contact, normal affect. Memory intact not anxious or depressed appearing.  CNS: CN 2-12 intact, power,  normal throughout.no focal deficits noted.   Assessment & Plan  Hyperlipidemia The 10-year ASCVD risk score (Arnett DK, et al., 2019) is: 8.8% does not want  medications for lowering lipid at this time. Will check labs at next visit . Risk of CVA, MI discussed she verbalized understanding   Eat a healthy diet, including lots of fruits and vegetables. Avoid foods with a lot of saturated and trans fats, such as red meat, butter, fried foods and cheese . Maintain a healthy weight.  Smoking cessation education completed.   Lab Results  Component Value Date   CHOL 228 (H) 04/12/2022   HDL 55 04/12/2022   LDLCALC 147 (H) 04/12/2022   TRIG 143 04/12/2022   CHOLHDL 4.1 04/12/2022    Current every day smoker Smokes about 0.5 pack/day  Asked about quitting: confirms that he/she currently smokes cigarettes Advise to quit smoking: Educated about QUITTING to reduce the risk of cancer, cardio and cerebrovascular disease. Assess willingness: Unwilling to quit at this time, but is working on cutting back. Assist with counseling and pharmacotherapy: Counseled for 5 minutes and literature provided. Arrange for follow up: follow up in 3 months and continue to offer help.  High blood pressure BP Readings from Last 3 Encounters:  09/03/22 119/76  08/15/22 139/68  07/18/22 116/68  Chronic medical condition well-controlled on amlodipine 5 mg daily Continue current medication DASH diet advised engage in regular moderate exercises at least 150 minutes weekly as tolerated Follow-up in 6 months  Obesity (BMI 30-39.9) Wt Readings from Last 3 Encounters:  09/03/22 188 lb (85.3 kg)  06/20/22 184 lb 12.8 oz (83.8 kg)  05/23/22 184 lb 3.2 oz (83.6 kg)  Patient counseled on low-carb  diet encouraged to engage in regular moderate exercises at least 150 minutes weekly as tolerated

## 2022-09-04 ENCOUNTER — Other Ambulatory Visit: Payer: Self-pay

## 2022-09-12 IMAGING — PT NM PET TUM IMG INITIAL (PI) SKULL BASE T - THIGH
1 series · 2 of 2 positions shown · non-contrast
Comparison: Outside 04/13/2021 PET-CT.

CLINICAL DATA: Subsequent treatment strategy for metastatic left
breast cancer with bone metastases.

EXAM:
NUCLEAR MEDICINE PET SKULL BASE TO THIGH
TECHNIQUE: 8.7 mCi F-18 FDG was injected intravenously. Full-ring PET imaging
was performed from the skull base to thigh after the radiotracer. CT
data was obtained and used for attenuation correction and anatomic
localization.
Fasting blood glucose: 99 mg/dl

[Series 8222: results mm oncology reading · 3.0mm · 0.45mm/px · 2 of 2 slices shown]
[im 1/2]
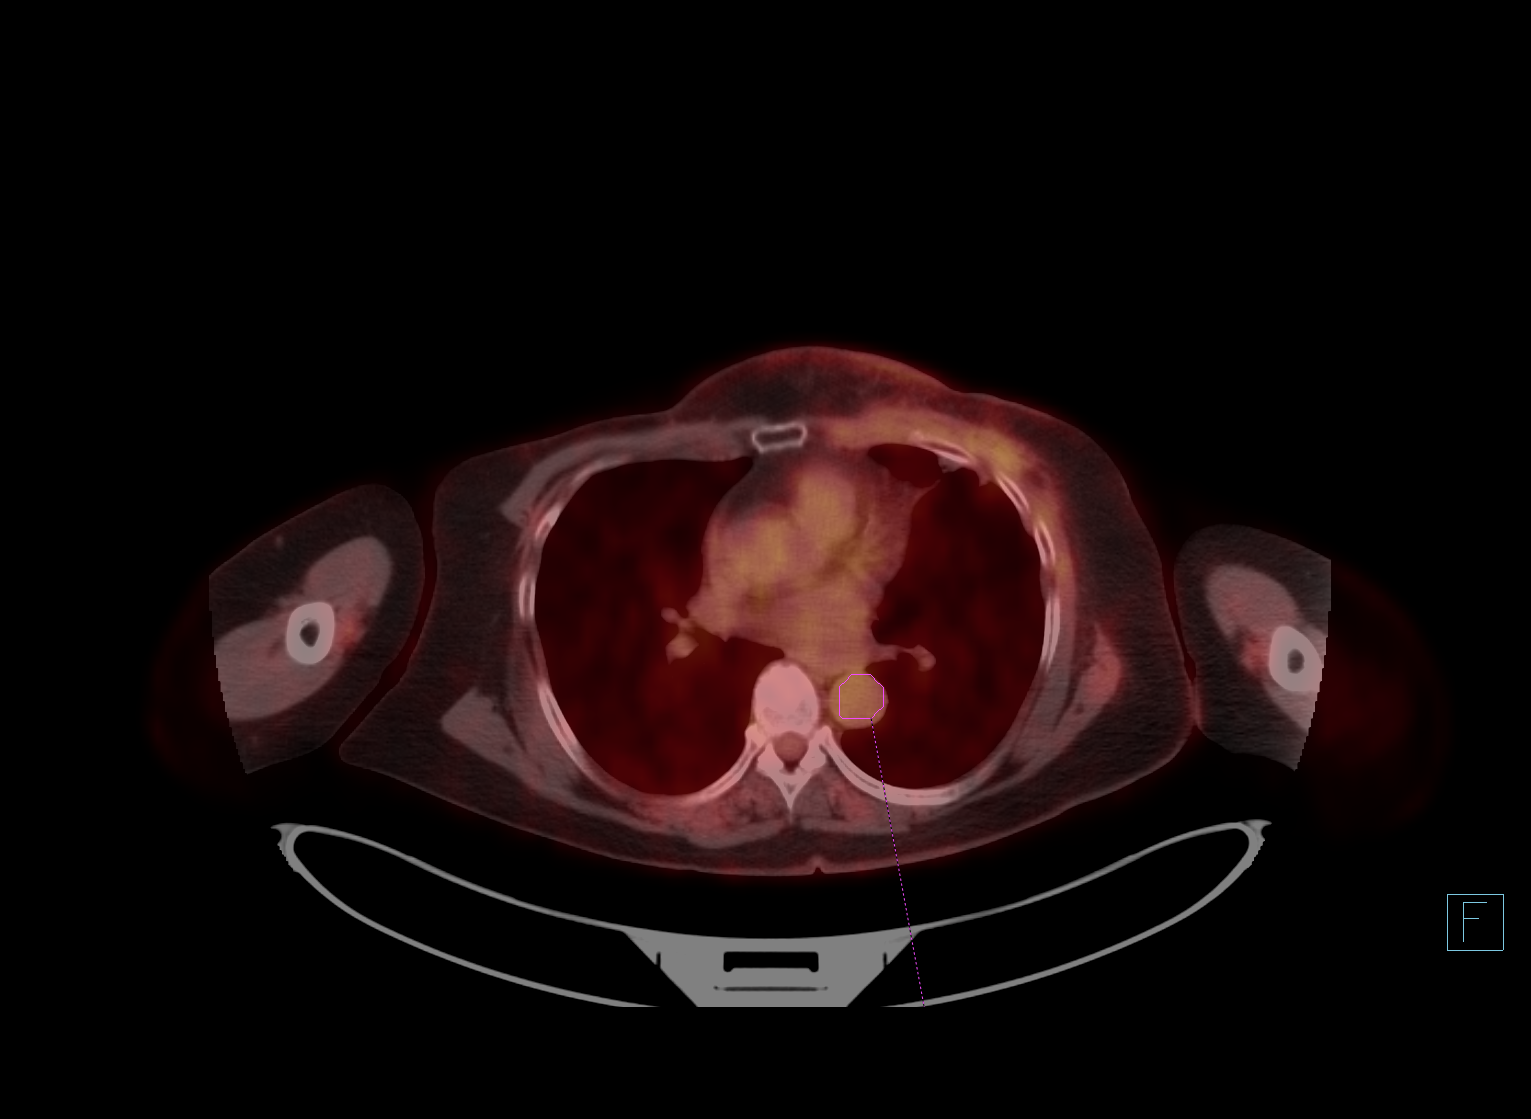
[im 2/2]
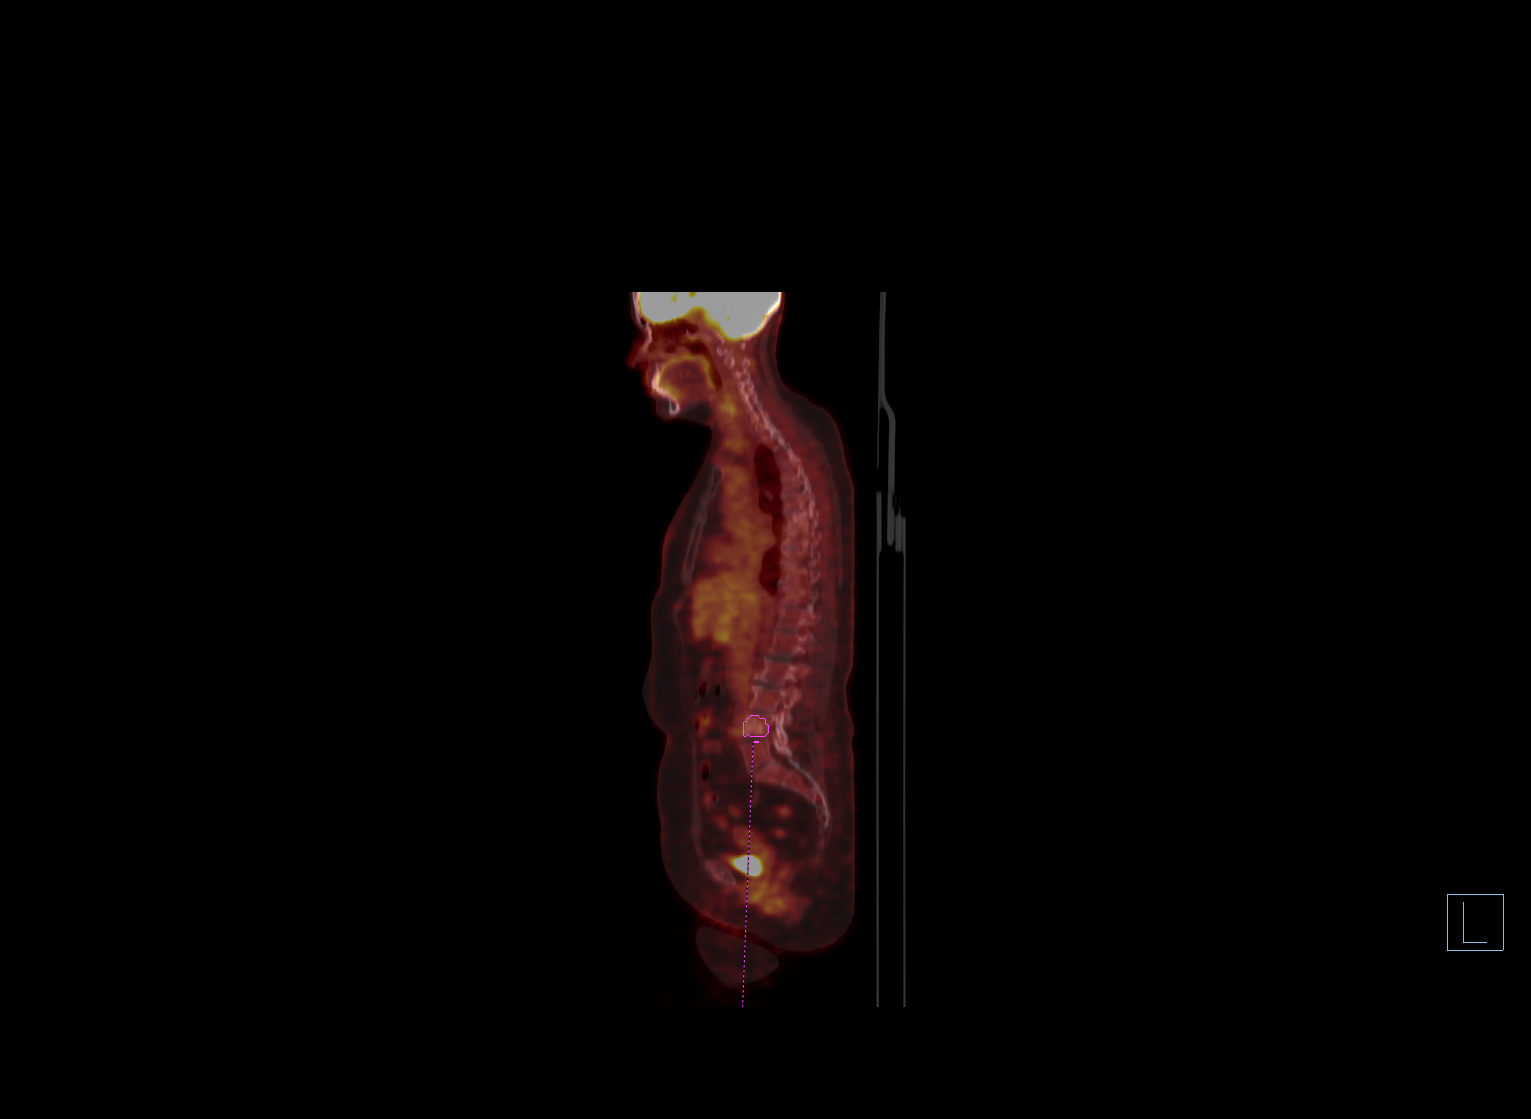

[2 of 2 positions shown; findings below may reference images not displayed]

FINDINGS: Mediastinal blood pool activity: SUV max

Liver activity: SUV max NA

NECK: No hypermetabolic lymph nodes in the neck.

Incidental CT findings: none

CHEST: No enlarged or hypermetabolic axillary, mediastinal or hilar
lymph nodes. No hypermetabolic pulmonary findings.

New curvilinear asymmetric muscular uptake in the lower left scalene
muscles and in the intercostal muscles of the ventral upper left
chest wall, without discrete mass correlate on the noncontrast CT
images, favor activity related uptake. Stable postsurgical changes
from bilateral mastectomy with stable asymmetric ventral left chest
wall skin thickening and bandlike subcutaneous fat stranding. No
discrete hypermetabolic chest wall masses.

Incidental CT findings: Moderate centrilobular emphysema. Scattered
subcentimeter calcified left pulmonary granulomas are stable.
Sharply marginated curvilinear bandlike consolidation in the
anterior left upper lobe is unchanged and compatible with mild
radiation fibrosis. No new significant pulmonary nodules.

ABDOMEN/PELVIS: No abnormal hypermetabolic activity within the
liver, pancreas, adrenal glands, or spleen. No hypermetabolic lymph
nodes in the abdomen or pelvis.

Incidental CT findings: Atherosclerotic nonaneurysmal abdominal
aorta.

SKELETON: Widespread scattered sclerotic osseous lesions throughout
the thoracolumbar spine and bilateral pelvic girdle, unchanged on
the CT images, and without hypermetabolism. Representative 2.4 cm
sclerotic right L4 lesion (series 4/image 137) with max SUV 2.6,
below mediastinal blood pool activity, stable. No focal
hypermetabolic activity to suggest skeletal metastasis. No new
osseous lesions.

Incidental CT findings: none
IMPRESSION: 1. No evidence of hypermetabolic metastatic disease. Widespread
scattered sclerotic bone metastases in the spine and pelvis are
stable and not metabolically active. No lymphadenopathy.
2. Curvilinear asymmetric muscular uptake in the lower left scalene
muscles and in the intercostal muscles of the ventral upper left
chest wall, without discrete mass correlate on the noncontrast CT
images, favor activity related uptake.
3. Aortic Atherosclerosis (4HHYX-SRT.T) and Emphysema (4HHYX-Q99.O).

## 2022-09-16 ENCOUNTER — Ambulatory Visit (HOSPITAL_COMMUNITY)
Admission: RE | Admit: 2022-09-16 | Discharge: 2022-09-16 | Disposition: A | Discharge status: Home / Self Care | Payer: Medicaid Other | Source: Ambulatory Visit | Attending: Hematology | Admitting: Hematology

## 2022-09-16 ENCOUNTER — Encounter (HOSPITAL_COMMUNITY): Payer: Self-pay | Admitting: Radiology

## 2022-09-16 ENCOUNTER — Encounter (HOSPITAL_COMMUNITY): Payer: Self-pay

## 2022-09-16 ENCOUNTER — Other Ambulatory Visit: Payer: Self-pay

## 2022-09-16 DIAGNOSIS — C50919 Malignant neoplasm of unspecified site of unspecified female breast: Secondary | ICD-10-CM | POA: Insufficient documentation

## 2022-09-16 DIAGNOSIS — Z853 Personal history of malignant neoplasm of breast: Secondary | ICD-10-CM | POA: Diagnosis not present

## 2022-09-16 MED ORDER — DIPHENHYDRAMINE HCL 50 MG PO TABS: 50.0000 mg | ORAL_TABLET | Freq: Once | ORAL | 0 refills | Status: DC

## 2022-09-16 MED ORDER — PREDNISONE 50 MG PO TABS: ORAL_TABLET | ORAL | 0 refills | Status: DC

## 2022-09-16 MED ORDER — TECHNETIUM TC 99M MEDRONATE IV KIT
20.0000 | PACK | Freq: Once | INTRAVENOUS | Status: AC | PRN
  Administered 2022-09-16: 20.7 via INTRAVENOUS

## 2022-09-20 ENCOUNTER — Ambulatory Visit (HOSPITAL_COMMUNITY): Payer: Medicaid Other

## 2022-09-24 ENCOUNTER — Ambulatory Visit: Payer: Medicaid Other | Admitting: Hematology

## 2022-09-24 ENCOUNTER — Inpatient Hospital Stay: Payer: Medicaid Other

## 2022-09-24 ENCOUNTER — Inpatient Hospital Stay: Payer: Medicaid Other | Attending: Hematology

## 2022-09-24 VITALS — BP 133/80 | HR 85 | Temp 97.2°F | Resp 18

## 2022-09-24 DIAGNOSIS — C50919 Malignant neoplasm of unspecified site of unspecified female breast: Secondary | ICD-10-CM

## 2022-09-24 DIAGNOSIS — Z5111 Encounter for antineoplastic chemotherapy: Secondary | ICD-10-CM | POA: Diagnosis not present

## 2022-09-24 DIAGNOSIS — Z170 Estrogen receptor positive status [ER+]: Secondary | ICD-10-CM | POA: Insufficient documentation

## 2022-09-24 DIAGNOSIS — C50912 Malignant neoplasm of unspecified site of left female breast: Secondary | ICD-10-CM | POA: Diagnosis not present

## 2022-09-24 DIAGNOSIS — C7951 Secondary malignant neoplasm of bone: Secondary | ICD-10-CM | POA: Insufficient documentation

## 2022-09-24 LAB — COMPREHENSIVE METABOLIC PANEL
ALT: 59 U/L — ABNORMAL HIGH (ref 0–44)
AST: 45 U/L — ABNORMAL HIGH (ref 15–41)
Albumin: 4.1 g/dL (ref 3.5–5.0)
Alkaline Phosphatase: 56 U/L (ref 38–126)
Anion gap: 10 (ref 5–15)
BUN: 15 mg/dL (ref 6–20)
CO2: 24 mmol/L (ref 22–32)
Calcium: 9.3 mg/dL (ref 8.9–10.3)
Chloride: 105 mmol/L (ref 98–111)
Creatinine, Ser: 0.85 mg/dL (ref 0.44–1.00)
GFR, Estimated: >60 mL/min (ref >60)
Glucose, Bld: 86 mg/dL (ref 70–99)
Potassium: 3.8 mmol/L (ref 3.5–5.1)
Sodium: 139 mmol/L (ref 135–145)
Total Bilirubin: 0.5 mg/dL (ref 0.3–1.2)
Total Protein: 7.4 g/dL (ref 6.5–8.1)

## 2022-09-24 LAB — CBC WITH DIFFERENTIAL/PLATELET
Abs Immature Granulocytes: 0.01 10*3/uL (ref 0.00–0.07)
Basophils Absolute: 0.1 10*3/uL (ref 0.0–0.1)
Basophils Relative: 2 %
Eosinophils Absolute: 0.1 10*3/uL (ref 0.0–0.5)
Eosinophils Relative: 2 %
HCT: 40.1 % (ref 36.0–46.0)
Hemoglobin: 13.8 g/dL (ref 12.0–15.0)
Immature Granulocytes: 0 %
Lymphocytes Relative: 39 %
Lymphs Abs: 1.1 10*3/uL (ref 0.7–4.0)
MCH: 35.4 pg — ABNORMAL HIGH (ref 26.0–34.0)
MCHC: 34.4 g/dL (ref 30.0–36.0)
MCV: 102.8 fL — ABNORMAL HIGH (ref 80.0–100.0)
Monocytes Absolute: 0.2 10*3/uL (ref 0.1–1.0)
Monocytes Relative: 8 %
Neutro Abs: 1.5 10*3/uL — ABNORMAL LOW (ref 1.7–7.7)
Neutrophils Relative %: 49 %
Platelets: 308 10*3/uL (ref 150–400)
RBC: 3.9 MIL/uL (ref 3.87–5.11)
RDW: 13.1 % (ref 11.5–15.5)
WBC: 2.9 10*3/uL — ABNORMAL LOW (ref 4.0–10.5)
nRBC: 0 % (ref 0.0–0.2)

## 2022-09-24 LAB — MAGNESIUM: Magnesium: 2 mg/dL (ref 1.7–2.4)

## 2022-09-24 MED ORDER — FULVESTRANT 250 MG/5ML IM SOSY
500.0000 mg | PREFILLED_SYRINGE | Freq: Once | INTRAMUSCULAR | Status: AC
  Administered 2022-09-24: 500 mg via INTRAMUSCULAR
  Filled 2022-09-24: qty 10

## 2022-09-24 MED ORDER — DENOSUMAB 120 MG/1.7ML ~~LOC~~ SOLN
120.0000 mg | Freq: Once | SUBCUTANEOUS | Status: AC
  Administered 2022-09-24: 120 mg via SUBCUTANEOUS
  Filled 2022-09-24: qty 1.7

## 2022-09-24 NOTE — Progress Notes (Signed)
Patient presents today for Faslodex and Xgeva injections per providers order.  Vital signs WNL.  Patient has been taking vitamin D and Calcium supplements, has had no major dental work, and had had no jaw pain.    Faslodex and Xgeva administration without incident; injection site WNL; see MAR for injection details.  Patient tolerated procedure well and without incident.  No questions or complaints noted at this time.

## 2022-09-24 NOTE — Patient Instructions (Signed)
[facility]  Discharge Instructions: Thank you for choosing [facility] to provide your oncology and hematology care.  If you have a lab appointment with the [facility], please come in thru the Main Entrance and check in at the main information desk.  Wear comfortable clothing and clothing appropriate for easy access to any Portacath or PICC line.   We strive to give you quality time with your provider. You may need to reschedule your appointment if you arrive late (15 or more minutes).  Arriving late affects you and other patients whose appointments are after yours.  Also, if you miss three or more appointments without notifying the office, you may be dismissed from the clinic at the provider's discretion.      For prescription refill requests, have your pharmacy contact our office and allow 72 hours for refills to be completed.    Today you received the following chemotherapy and/or immunotherapy agents Faslodex/Xgeva      To help prevent nausea and vomiting after your treatment, we encourage you to take your nausea medication as directed.  BELOW ARE SYMPTOMS THAT SHOULD BE REPORTED IMMEDIATELY: *FEVER GREATER THAN 100.4 F (38 C) OR HIGHER *CHILLS OR SWEATING *NAUSEA AND VOMITING THAT IS NOT CONTROLLED WITH YOUR NAUSEA MEDICATION *UNUSUAL SHORTNESS OF BREATH *UNUSUAL BRUISING OR BLEEDING *URINARY PROBLEMS (pain or burning when urinating, or frequent urination) *BOWEL PROBLEMS (unusual diarrhea, constipation, pain near the anus) TENDERNESS IN MOUTH AND THROAT WITH OR WITHOUT PRESENCE OF ULCERS (sore throat, sores in mouth, or a toothache) UNUSUAL RASH, SWELLING OR PAIN  UNUSUAL VAGINAL DISCHARGE OR ITCHING   Items with * indicate a potential emergency and should be followed up as soon as possible or go to the Emergency Department if any problems should occur.  Please show the CHEMOTHERAPY ALERT CARD or IMMUNOTHERAPY ALERT CARD at check-in to the  Emergency Department and triage nurse.  Should you have questions after your visit or need to cancel or reschedule your appointment, please contact [facility] [phone]  and follow the prompts.  Office hours are 8:00 a.m. to 4:30 p.m. Monday - Friday. Please note that voicemails left after 4:00 p.m. may not be returned until the following business day.  We are closed weekends and major holidays. You have access to a nurse at all times for urgent questions. Please call the main number to the clinic [phone] and follow the prompts.  For any non-urgent questions, you may also contact your provider using MyChart. We now offer e-Visits for anyone 27 and older to request care online for non-urgent symptoms. For details visit [email or web address].   Also download the MyChart app! Go to the app store, search "MyChart", open the app, select [place], and log in with your MyChart username and password.  Masks are optional in the cancer centers. If you would like for your care team to wear a mask while they are taking care of you, please let them know. You may have one support person who is at least 60 years old accompany you for your appointments.

## 2022-09-25 LAB — CANCER ANTIGEN 27.29: CA 27.29: 31.7 U/mL (ref 0.0–38.6)

## 2022-09-26 LAB — CANCER ANTIGEN 15-3: CA 15-3: 27.7 U/mL — ABNORMAL HIGH (ref 0.0–25.0)

## 2022-10-04 ENCOUNTER — Ambulatory Visit (HOSPITAL_COMMUNITY)
Admission: RE | Admit: 2022-10-04 | Discharge: 2022-10-04 | Disposition: A | Discharge status: Home / Self Care | Payer: Medicaid Other | Source: Ambulatory Visit | Attending: Hematology | Admitting: Hematology

## 2022-10-04 DIAGNOSIS — I700 Atherosclerosis of aorta: Secondary | ICD-10-CM | POA: Diagnosis not present

## 2022-10-04 DIAGNOSIS — C7951 Secondary malignant neoplasm of bone: Secondary | ICD-10-CM | POA: Diagnosis not present

## 2022-10-04 DIAGNOSIS — J432 Centrilobular emphysema: Secondary | ICD-10-CM | POA: Diagnosis not present

## 2022-10-04 DIAGNOSIS — K449 Diaphragmatic hernia without obstruction or gangrene: Secondary | ICD-10-CM | POA: Diagnosis not present

## 2022-10-04 DIAGNOSIS — C50919 Malignant neoplasm of unspecified site of unspecified female breast: Secondary | ICD-10-CM | POA: Diagnosis not present

## 2022-10-04 MED ORDER — DIPHENHYDRAMINE HCL 25 MG PO CAPS
ORAL_CAPSULE | ORAL | Status: AC
  Administered 2022-10-04: 50 mg via ORAL
  Filled 2022-10-04: qty 2

## 2022-10-04 MED ORDER — IOHEXOL 300 MG/ML  SOLN
100.0000 mL | Freq: Once | INTRAMUSCULAR | Status: AC | PRN
  Administered 2022-10-04: 100 mL via INTRAVENOUS

## 2022-10-04 MED ORDER — DIPHENHYDRAMINE HCL 25 MG PO CAPS: 50.0000 mg | ORAL_CAPSULE | Freq: Four times a day (QID) | ORAL | Status: DC | PRN

## 2022-10-10 ENCOUNTER — Ambulatory Visit: Payer: Medicaid Other

## 2022-10-10 ENCOUNTER — Other Ambulatory Visit: Payer: Medicaid Other

## 2022-10-21 ENCOUNTER — Other Ambulatory Visit: Payer: Self-pay

## 2022-10-21 DIAGNOSIS — C50919 Malignant neoplasm of unspecified site of unspecified female breast: Secondary | ICD-10-CM

## 2022-10-22 ENCOUNTER — Inpatient Hospital Stay: Payer: Medicaid Other | Admitting: Hematology

## 2022-10-22 ENCOUNTER — Inpatient Hospital Stay: Payer: Medicaid Other | Attending: Hematology

## 2022-10-22 ENCOUNTER — Inpatient Hospital Stay: Payer: Medicaid Other

## 2022-10-22 VITALS — BP 141/83 | HR 85 | Temp 98.0°F | Resp 18 | Ht 64.5 in | Wt 186.1 lb

## 2022-10-22 DIAGNOSIS — Z9012 Acquired absence of left breast and nipple: Secondary | ICD-10-CM | POA: Diagnosis not present

## 2022-10-22 DIAGNOSIS — C50919 Malignant neoplasm of unspecified site of unspecified female breast: Secondary | ICD-10-CM | POA: Diagnosis not present

## 2022-10-22 DIAGNOSIS — C50912 Malignant neoplasm of unspecified site of left female breast: Secondary | ICD-10-CM | POA: Insufficient documentation

## 2022-10-22 DIAGNOSIS — Z803 Family history of malignant neoplasm of breast: Secondary | ICD-10-CM | POA: Insufficient documentation

## 2022-10-22 DIAGNOSIS — R1310 Dysphagia, unspecified: Secondary | ICD-10-CM | POA: Diagnosis not present

## 2022-10-22 DIAGNOSIS — Z5111 Encounter for antineoplastic chemotherapy: Secondary | ICD-10-CM | POA: Insufficient documentation

## 2022-10-22 DIAGNOSIS — F17210 Nicotine dependence, cigarettes, uncomplicated: Secondary | ICD-10-CM | POA: Diagnosis not present

## 2022-10-22 DIAGNOSIS — Z7982 Long term (current) use of aspirin: Secondary | ICD-10-CM | POA: Diagnosis not present

## 2022-10-22 DIAGNOSIS — C7951 Secondary malignant neoplasm of bone: Secondary | ICD-10-CM | POA: Diagnosis present

## 2022-10-22 DIAGNOSIS — Z806 Family history of leukemia: Secondary | ICD-10-CM | POA: Diagnosis not present

## 2022-10-22 DIAGNOSIS — Z170 Estrogen receptor positive status [ER+]: Secondary | ICD-10-CM | POA: Insufficient documentation

## 2022-10-22 DIAGNOSIS — G629 Polyneuropathy, unspecified: Secondary | ICD-10-CM | POA: Diagnosis not present

## 2022-10-22 DIAGNOSIS — Z79899 Other long term (current) drug therapy: Secondary | ICD-10-CM | POA: Insufficient documentation

## 2022-10-22 DIAGNOSIS — M8580 Other specified disorders of bone density and structure, unspecified site: Secondary | ICD-10-CM | POA: Insufficient documentation

## 2022-10-22 LAB — CBC WITH DIFFERENTIAL/PLATELET
Abs Immature Granulocytes: 0.01 10*3/uL (ref 0.00–0.07)
Basophils Absolute: 0.1 10*3/uL (ref 0.0–0.1)
Basophils Relative: 2 %
Eosinophils Absolute: 0 10*3/uL (ref 0.0–0.5)
Eosinophils Relative: 1 %
HCT: 38.2 % (ref 36.0–46.0)
Hemoglobin: 13.2 g/dL (ref 12.0–15.0)
Immature Granulocytes: 0 %
Lymphocytes Relative: 32 %
Lymphs Abs: 0.9 10*3/uL (ref 0.7–4.0)
MCH: 35.2 pg — ABNORMAL HIGH (ref 26.0–34.0)
MCHC: 34.6 g/dL (ref 30.0–36.0)
MCV: 101.9 fL — ABNORMAL HIGH (ref 80.0–100.0)
Monocytes Absolute: 0.2 10*3/uL (ref 0.1–1.0)
Monocytes Relative: 5 %
Neutro Abs: 1.7 10*3/uL (ref 1.7–7.7)
Neutrophils Relative %: 60 %
Platelets: 322 10*3/uL (ref 150–400)
RBC: 3.75 MIL/uL — ABNORMAL LOW (ref 3.87–5.11)
RDW: 13 % (ref 11.5–15.5)
WBC: 2.8 10*3/uL — ABNORMAL LOW (ref 4.0–10.5)
nRBC: 0 % (ref 0.0–0.2)

## 2022-10-22 LAB — COMPREHENSIVE METABOLIC PANEL
ALT: 59 U/L — ABNORMAL HIGH (ref 0–44)
AST: 44 U/L — ABNORMAL HIGH (ref 15–41)
Albumin: 4 g/dL (ref 3.5–5.0)
Alkaline Phosphatase: 52 U/L (ref 38–126)
Anion gap: 8 (ref 5–15)
BUN: 14 mg/dL (ref 6–20)
CO2: 26 mmol/L (ref 22–32)
Calcium: 9.5 mg/dL (ref 8.9–10.3)
Chloride: 108 mmol/L (ref 98–111)
Creatinine, Ser: 0.92 mg/dL (ref 0.44–1.00)
GFR, Estimated: >60 mL/min (ref >60)
Glucose, Bld: 101 mg/dL — ABNORMAL HIGH (ref 70–99)
Potassium: 3.8 mmol/L (ref 3.5–5.1)
Sodium: 142 mmol/L (ref 135–145)
Total Bilirubin: 0.6 mg/dL (ref 0.3–1.2)
Total Protein: 7.1 g/dL (ref 6.5–8.1)

## 2022-10-22 LAB — MAGNESIUM: Magnesium: 2.1 mg/dL (ref 1.7–2.4)

## 2022-10-22 MED ORDER — DENOSUMAB 120 MG/1.7ML ~~LOC~~ SOLN: 120.0000 mg | Freq: Once | SUBCUTANEOUS | Status: DC

## 2022-10-22 MED ORDER — FULVESTRANT 250 MG/5ML IM SOSY
500.0000 mg | PREFILLED_SYRINGE | Freq: Once | INTRAMUSCULAR | Status: AC
  Administered 2022-10-22: 500 mg via INTRAMUSCULAR
  Filled 2022-10-22: qty 10

## 2022-10-22 MED ORDER — DENOSUMAB 120 MG/1.7ML ~~LOC~~ SOLN
120.0000 mg | Freq: Once | SUBCUTANEOUS | Status: AC
  Administered 2022-10-22: 120 mg via SUBCUTANEOUS
  Filled 2022-10-22: qty 1.7

## 2022-10-22 MED ORDER — FULVESTRANT 250 MG/5ML IM SOSY: 500.0000 mg | PREFILLED_SYRINGE | Freq: Once | INTRAMUSCULAR | Status: DC

## 2022-10-22 NOTE — Progress Notes (Signed)
Patient is taking Ibrance as prescribed.  She has not missed any doses and reports no side effects at this time.   Patient has been assessed, vital signs and labs have been reviewed by Dr. Katragadda. ANC, Creatinine, LFTs, and Platelets are within treatment parameters per Dr. Katragadda. The patient is good to proceed with treatment at this time. Primary RN and pharmacy aware.  

## 2022-10-22 NOTE — Patient Instructions (Signed)
Wendy Zamora  Discharge Instructions: Thank you for choosing [facility] to provide your oncology and hematology care.  If you have a lab appointment with the [facility], please come in thru the Main Entrance and check in at the main information desk.  Wear comfortable clothing and clothing appropriate for easy access to any Portacath or PICC line.   We strive to give you quality time with your provider. You may need to reschedule your appointment if you arrive late (15 or more minutes).  Arriving late affects you and other patients whose appointments are after yours.  Also, if you miss three or more appointments without notifying the office, you may be dismissed from the clinic at the provider's discretion.      For prescription refill requests, have your pharmacy contact our office and allow 72 hours for refills to be completed.    Today you received the following chemotherapy and/or immunotherapy agents Faslodex/Xgeva.        To help prevent nausea and vomiting after your treatment, we encourage you to take your nausea medication as directed.  BELOW ARE SYMPTOMS THAT SHOULD BE REPORTED IMMEDIATELY: *FEVER GREATER THAN 100.4 F (38 C) OR HIGHER *CHILLS OR SWEATING *NAUSEA AND VOMITING THAT IS NOT CONTROLLED WITH YOUR NAUSEA MEDICATION *UNUSUAL SHORTNESS OF BREATH *UNUSUAL BRUISING OR BLEEDING *URINARY PROBLEMS (pain or burning when urinating, or frequent urination) *BOWEL PROBLEMS (unusual diarrhea, constipation, pain near the anus) TENDERNESS IN MOUTH AND THROAT WITH OR WITHOUT PRESENCE OF ULCERS (sore throat, sores in mouth, or a toothache) UNUSUAL RASH, SWELLING OR PAIN  UNUSUAL VAGINAL DISCHARGE OR ITCHING   Items with * indicate a potential emergency and should be followed up as soon as possible or go to the Emergency Department if any problems should occur.  Please show the CHEMOTHERAPY ALERT CARD or IMMUNOTHERAPY ALERT CARD at check-in to the  Emergency Department and triage nurse.  Should you have questions after your visit or need to cancel or reschedule your appointment, please contact [facility] [phone]  and follow the prompts.  Office hours are 8:00 a.m. to 4:30 p.m. Monday - Friday. Please note that voicemails left after 4:00 p.m. may not be returned until the following business day.  We are closed weekends and major holidays. You have access to a nurse at all times for urgent questions. Please call the main number to the clinic [phone] and follow the prompts.  For any non-urgent questions, you may also contact your provider using MyChart. We now offer e-Visits for anyone 50 and older to request care online for non-urgent symptoms. For details visit [email or web address].   Also download the MyChart app! Go to the app store, search "MyChart", open the app, select [facility], and log in with your MyChart username and password.  Masks are optional in the cancer centers. If you would like for your care team to wear a mask while they are taking care of you, please let them know. You may have one support person who is at least 60 years old accompany you for your appointments.

## 2022-10-22 NOTE — Progress Notes (Signed)
Patient tolerated Faslodex and Xgeva injections with no complaints voiced.  Site clean and dry with no bruising or swelling noted.  No complaints of pain.  Discharged with vital signs stable and no signs or symptoms of distress noted.  

## 2022-10-22 NOTE — Progress Notes (Signed)
Wendy Zamora, [place] [address]   CLINIC:  Medical Oncology/Hematology  PCP:  Johnette Abraham, MD [address] / [place] [address] [phone]   REASON FOR VISIT:  Follow-up for metastatic breast cancer  PRIOR THERAPY: none  NGS Results: not done  CURRENT THERAPY: Palbociclib + Fulvestrant every 4 weeks  BRIEF ONCOLOGIC HISTORY:  Oncology History  Primary malignant neoplasm of breast with metastasis ([place])  07/06/2021 Initial Diagnosis   Metastatic breast cancer ([place])   07/18/2021 - 08/15/2022 Chemotherapy   Patient is on Treatment Plan : BREAST Palbociclib + Fulvestrant q28d     07/18/2021 -  Chemotherapy   Patient is on Treatment Plan : BREAST Palbociclib D1-21 + Fulvestrant q28d       CANCER STAGING:  Cancer Staging  No matching staging information was found for the patient.  INTERVAL HISTORY:  [name], a 61 y.o. female, seen for follow-up of her metastatic breast cancer.  She is tolerating Ibrance very well.  Reports energy levels of 25%.  Had some pain in the left lateral chest wall on and off with activity.  Did not report any fevers or infections.   REVIEW OF SYSTEMS:  Review of Systems  Respiratory:  Positive for shortness of breath.   Musculoskeletal:  Positive for arthralgias (Hip pains).  Neurological:  Positive for headaches and numbness (hands - stable).  Psychiatric/Behavioral:  The patient is nervous/anxious.   All other systems reviewed and are negative.   PAST MEDICAL/SURGICAL HISTORY:  Past Medical History:  Diagnosis Date   Allergy    Asthma    Asthma    diagnosed age 89   Bone metastasis 08/2020   was on prolia   Breast cancer ([place])    started chemo 05/05/2019. finished 08/2019   Gangrene ([place])    GERD (gastroesophageal reflux disease)    Hypertension    Hypertension    started bp meds 2008   Lymphedema of arm    left arm   Osteopenia    Shingles 04/20/2020   left side    Past Surgical History:  Procedure Laterality Date   BIOPSY  01/22/2022   Procedure: BIOPSY;  Surgeon: Montez Morita, Quillian Quince, MD;  Location: [facility];  Service: Gastroenterology;;   Wilmon Pali RELEASE Bilateral 2006   ESOPHAGOGASTRODUODENOSCOPY (EGD) WITH PROPOFOL N/A 01/22/2022   Procedure: ESOPHAGOGASTRODUODENOSCOPY (EGD) WITH PROPOFOL;  Surgeon: Harvel Quale, MD;  Location: [facility];  Service: Gastroenterology;  Laterality: N/A;  205   hysteroscopy     age 89   mastectomy Bilateral 10/22/2019   pt had necrosis after 1st surgery, had another surgery to remove implants 12/10/2019   MOUTH SURGERY     TUBAL LIGATION     TUBAL LIGATION Bilateral 1998   WISDOM TOOTH EXTRACTION     early 30's    SOCIAL HISTORY:  Social History   Socioeconomic History   Marital status: Married    Spouse name: Not on file   Number of children: 2   Years of education: Not on file   Highest education level: Not on file  Occupational History   Occupation: Disability  Tobacco Use   Smoking status: Every Day    Packs/day: 0.50    Years: 40.00    Total pack years: 20.00    Types: Cigarettes   Smokeless tobacco: Never   Tobacco comments:    Smokes half a pack a day for 40 years.  Vaping Use   Vaping Use: Never used  Substance and Sexual Activity   Alcohol use: Never   Drug use: Never   Sexual activity: Yes  Other Topics Concern   Not on file  Social History Narrative   Lives with her husband.    Social Determinants of Health   Financial Resource Strain: Low Risk  (07/03/2021)   Overall Financial Resource Strain (CARDIA)    Difficulty of Paying Living Expenses: Not hard at all  Food Insecurity: No Food Insecurity (07/03/2021)   Hunger Vital Sign    Worried About Running Out of Food in the Last Year: Never true    Ran Out of Food in the Last Year: Never true  Transportation Needs: No Transportation Needs (07/03/2021)   PRAPARE - Radiographer, therapeutic (Medical): No    Lack of Transportation (Non-Medical): No  Physical Activity: Insufficiently Active (07/03/2021)   Exercise Vital Sign    Days of Exercise per Week: 5 days    Minutes of Exercise per Session: 10 min  Stress: No Stress Concern Present (07/03/2021)   [place]    Feeling of Stress : Only a little  Social Connections: Moderately Isolated (07/03/2021)   Social Connection and Isolation Panel [NHANES]    Frequency of Communication with Friends and Family: Three times a week    Frequency of Social Gatherings with Friends and Family: Once a week    Attends Religious Services: Never    Marine scientist or Organizations: No    Attends Archivist Meetings: Never    Marital Status: Married  Human resources officer Violence: Not At Risk (07/03/2021)   Humiliation, Afraid, Rape, and Kick questionnaire    Fear of Current or Ex-Partner: No    Emotionally Abused: No    Physically Abused: No    Sexually Abused: No    FAMILY HISTORY:  Family History  Problem Relation Age of Onset   Leukemia Mother    Breast cancer Sister    Diabetes Maternal Uncle    Hyperlipidemia Maternal Uncle    Diabetes Mellitus II Maternal Grandmother    Heart disease Maternal Grandfather    Diabetes Maternal Grandfather    Breast cancer Cousin    Breast cancer Half-Sister    Osteoporosis Half-Sister    Lupus Half-Sister    Lupus Half-Sister    Osteoporosis Half-Sister    Cancer - Lung Neg Hx    Colon cancer Neg Hx     CURRENT MEDICATIONS:  Current Outpatient Medications  Medication Sig Dispense Refill   amLODipine (NORVASC) 5 MG tablet Take 1 tablet (5 mg total) by mouth daily. 90 tablet 2   Ascorbic Acid (VITAMIN C) 1000 MG tablet Take 1,000 mg by mouth daily.     aspirin 81 MG EC tablet Take 81 mg by mouth daily.     butalbital-acetaminophen-caffeine (FIORICET) 50-325-40 MG tablet Take 1 tablet by mouth every 4  (four) hours as needed for headache. 30 tablet 0   Calcium-Magnesium-Vitamin D (CALCIUM 1200+D3 PO) Take 1 tablet by mouth daily.     cetirizine-pseudoephedrine (ZYRTEC-D) 5-120 MG tablet Take 1 tablet by mouth daily.     CINNAMON PO Take 1,000 mg by mouth daily.     cyanocobalamin 1000 MCG tablet Take 1 tablet (1,000 mcg total) by mouth daily. (Patient taking differently: Take 1,000 mcg by mouth every other day.) 30 tablet 6   cyclobenzaprine (FLEXERIL) 10 MG tablet Take  1 tablet (10 mg total) by mouth 2 (two) times daily as needed for muscle spasms. 60 tablet 5   denosumab (XGEVA) 120 MG/1.7ML SOLN injection Inject 120 mg into the skin every 30 (thirty) days.     diphenhydrAMINE (BENADRYL) 50 MG tablet Take 1 tablet (50 mg total) by mouth once for 1 dose. 1hr prior to scan 1 tablet 0   fulvestrant (FASLODEX) 250 MG/5ML injection Inject 500 mg into the muscle every 30 (thirty) days. One injection each buttock over 1-2 minutes. Warm prior to use.     furosemide (LASIX) 20 MG tablet Take 1 tablet (20 mg total) by mouth daily as needed. 30 tablet 0   IBRANCE 100 MG tablet TAKE 1 TABLET BY MOUTH 1 TIME A DAY ON DAYS 1 TO 21 OF A 28 DAY CYCLE 21 tablet 4   lansoprazole (PREVACID) 30 MG capsule Take 1 capsule (30 mg total) by mouth in the morning and at bedtime. 180 capsule 3   Misc. Devices MISC Please provide patient with left arm compression sleeve. Diagnosis metastatic breast cancer, bilateral mastectomy, left arm lymphedema 1 each 0   NAPHCON-A 0.025-0.3 % ophthalmic solution Place 2 drops into both eyes in the morning and at bedtime.     predniSONE (DELTASONE) 50 MG tablet Take 1 tab 13hrs, 7hrs and 1hr prior to scan. 3 tablet 0   PROAIR HFA 108 (90 Base) MCG/ACT inhaler Inhale 2 puffs into the lungs every 4 (four) hours as needed for wheezing or shortness of breath.     No current facility-administered medications for this visit.    ALLERGIES:  Allergies  Allergen Reactions   Augmentin  [Amoxicillin-Pot Clavulanate] Anaphylaxis   Ciprofloxacin Anaphylaxis   Claritin [Loratadine] Anaphylaxis   Gabapentin Anaphylaxis   Keflex [Cephalexin] Anaphylaxis   Levofloxacin Anaphylaxis   Lisinopril Swelling   Lisinopril Anaphylaxis   Metoprolol Swelling   Metoprolol Anaphylaxis   Nexium [Esomeprazole] Anaphylaxis   Sulfa Antibiotics Hives and Swelling   Tetracyclines & Related Anaphylaxis   Iohexol Hives    Needs pre meds   Ceftin [Cefuroxime Axetil] Hives   Chromium Other (See Comments)    headache   Oxycodone Itching   Sulfa Antibiotics     Possible Reaction    Latex Rash    itching   Nickel Rash    itching    PHYSICAL EXAM:  Performance status (ECOG): 0 - Asymptomatic  There were no vitals filed for this visit. Wt Readings from Last 3 Encounters:  09/03/22 188 lb (85.3 kg)  06/20/22 184 lb 12.8 oz (83.8 kg)  05/23/22 184 lb 3.2 oz (83.6 kg)   Physical Exam Vitals reviewed.  Constitutional:      Appearance: Normal appearance. She is obese.  Cardiovascular:     Rate and Rhythm: Normal rate and regular rhythm.     Pulses: Normal pulses.     Heart sounds: Normal heart sounds.  Pulmonary:     Effort: Pulmonary effort is normal.     Breath sounds: Normal breath sounds.  Lymphadenopathy:     Cervical: No cervical adenopathy.     Right cervical: No superficial, deep or posterior cervical adenopathy.    Left cervical: No superficial, deep or posterior cervical adenopathy.     Upper Body:     Right upper body: No supraclavicular or axillary adenopathy.     Left upper body: No supraclavicular or axillary adenopathy.  Neurological:     General: No focal deficit present.     Mental  Status: She is alert and oriented to person, place, and time.  Psychiatric:        Mood and Affect: Mood normal.        Behavior: Behavior normal.     LABORATORY DATA:  I have reviewed the labs as listed.     Latest Ref Rng & Units 09/24/2022    1:08 PM 08/15/2022    9:52 AM  07/18/2022    9:23 AM  CBC  WBC 4.0 - 10.5 K/uL 2.9  3.3  3.0   Hemoglobin 12.0 - 15.0 g/dL 13.8  13.2  13.1   Hematocrit 36.0 - 46.0 % 40.1  37.7  37.7   Platelets 150 - 400 K/uL 308  249  264       Latest Ref Rng & Units 09/24/2022    1:08 PM 08/15/2022    9:52 AM 07/18/2022    9:23 AM  CMP  Glucose 70 - 99 mg/dL 86  117  119   BUN 6 - 20 mg/dL '15  14  13   ' Creatinine 0.44 - 1.00 mg/dL 0.85  0.81  0.75   Sodium 135 - 145 mmol/L 139  141  141   Potassium 3.5 - 5.1 mmol/L 3.8  3.6  3.7   Chloride 98 - 111 mmol/L 105  110  108   CO2 22 - 32 mmol/L '24  24  26   ' Calcium 8.9 - 10.3 mg/dL 9.3  9.4  9.4   Total Protein 6.5 - 8.1 g/dL 7.4  7.3  7.4   Total Bilirubin 0.3 - 1.2 mg/dL 0.5  0.3  0.6   Alkaline Phos 38 - 126 U/L 56  56  52   AST 15 - 41 U/L 45  43  51   ALT 0 - 44 U/L 59  55  59     DIAGNOSTIC IMAGING:  I have independently reviewed the scans and discussed with the patient. CT CHEST ABDOMEN PELVIS W CONTRAST  Result Date: 10/07/2022 CLINICAL DATA:  Breast cancer; * Tracking Code: BO * EXAM: CT CHEST, ABDOMEN, AND PELVIS WITH CONTRAST TECHNIQUE: Multidetector CT imaging of the chest, abdomen and pelvis was performed following the standard protocol during bolus administration of intravenous contrast. RADIATION DOSE REDUCTION: This exam was performed according to the departmental dose-optimization program which includes automated exposure control, adjustment of the mA and/or kV according to patient size and/or use of iterative reconstruction technique. CONTRAST:  142m OMNIPAQUE IOHEXOL 300 MG/ML  SOLN COMPARISON:  CT chest, and pelvis dated March 21, 2022 FINDINGS: CT CHEST FINDINGS Cardiovascular: Normal heart size. No pericardial effusion. No suspicious filling defects of the pulmonary arteries. Normal caliber thoracic aorta with mild atherosclerotic disease. Mediastinum/Nodes: Small hiatal hernia. Thyroid is unremarkable. No pathologically enlarged lymph nodes seen in the chest.  Lungs/Pleura: Central airways are patent. Mild upper lobe predominant centrilobular emphysema. No consolidation, pleural effusion or pneumothorax. Subpleural linear opacities of the anterior left upper lobe, compatible with postradiation change. No new or enlarging pulmonary nodules. Sclerotic osseous lesions seen in the thoracic spine, unchanged when compared with prior exam radiologic restricted speaking Musculoskeletal: Status post left mastectomy. Unchanged sclerotic osseous metastatic disease involving the thoracic spine and right scapula. CT ABDOMEN PELVIS FINDINGS Hepatobiliary: No focal liver abnormality is seen. No gallstones, gallbladder wall thickening, or biliary dilatation. Pancreas: Unremarkable. No pancreatic ductal dilatation or surrounding inflammatory changes. Spleen: Normal in size without focal abnormality. Adrenals/Urinary Tract: Adrenal glands are unremarkable. Kidneys are normal, without renal calculi, focal lesion,  or hydronephrosis. Bladder is unremarkable. Stomach/Bowel: Stomach is within normal limits. Appendix appears normal. No evidence of bowel wall thickening, distention, or inflammatory changes. Vascular/Lymphatic: Aortic atherosclerosis. No enlarged abdominal or pelvic lymph nodes. Reproductive: Uterus and bilateral adnexa are unremarkable. Other: No abdominal wall hernia or abnormality. No abdominopelvic ascites. Musculoskeletal: Sclerotic osseous lesions of the lumbar spine, pelvis, and right femoral neck, unchanged compared with prior exam. IMPRESSION: 1. Unchanged sclerotic osseous metastatic disease involving the thoracic spine, lumbar spine, pelvis, and right femoral neck. 2. No evidence of new or progressive metastatic disease in the chest, abdomen or pelvis. 3. Aortic Atherosclerosis (ICD10-I70.0) and Emphysema (ICD10-J43.9). Electronically Signed   By: Yetta Glassman M.D.   On: 10/07/2022 09:39     ASSESSMENT:  1.  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 - Started on Ibrance and Faslodex in December 2021 for metastatic disease. - PET scan on 08/10/2021 with no evidence of hypermetabolic metastatic disease.  Widespread scattered sclerotic bone metastasis in the spine and pelvis are stable and not metabolically active.  No lymphadenopathy.  Asymmetric muscular uptake in the left lower scalene muscles and in the intercostal muscles of the ventral upper left chest wall without discrete mass correlate on the CT images, favoring activity related uptake. Leslee Home was dose reduced to 100 mg 3 weeks on/1 week off on 10/11/2021 due to fatigue.  2.  Social/family history: - She is a retired Building control surveyor.  She recently moved back from [place]. - Younger sister had breast cancer in her 69s.  Mother died of leukemia at age 44.  Paternal cousin also had breast cancer.  3.  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  She underwent immediate implants which were later removed on 12/10/2019 secondary to infection and necrosis. - XRT to the chest wall and regional lymph nodes, 60 [address], from 02/29/2020 through 04/12/2020 - She was treated with adjuvant anastrozole which was started 12/09/2019 and was found to have metastatic disease in November 2021. - Previous genetic testing was reportedly negative and [place].   PLAN:  1.  Metastatic breast cancer to the bones, ER/PR  positive, HER2 negative: - CT CAP (10/04/2022): Unchanged sclerotic bone mets with no new or progressive metastatic disease. - Bone scan (09/16/2022): Right mid rib focus of increased activity again noted.  A tiny new focus of increased activity in an upper lumbar vertebral body cannot be excluded. - Reviewed labs which showed mildly elevated AST and ALT are stable.  Mild leukopenia is also stable.  CA 15-3 was slightly elevated at 27.7 and CA 27-29 was normal. - Continue Ibrance 100 mg 3 weeks on/1 week off with monthly Faslodex. - RTC 3 months for follow-up with repeat labs and tumor marker.  2.  Peripheral neuropathy: - She has constant tingling/numbness in the hands which is stable.  No pharmacological intervention necessary.  3.  Bone metastatic disease: - Continue calcium and vitamin D supplements.  Calcium today is 9.5.  Continue monthly denosumab.  4.  Hot flashes: - Hot flashes are stable.  No pharmacological intervention necessary.  5.  Dysphagia: - Last EGD on 01/22/2022 with biopsies showing nonspecific reactive gastropathy. - No dysphagia reported at this time.  6.  Body pains: - Continue Flexeril as needed.  She does not require hydrocodone.   Orders placed this encounter:  No orders of the defined types were placed in this encounter.    Derek Jack, MD [place] [phone]

## 2022-10-22 NOTE — Patient Instructions (Addendum)
[facility]  Discharge Instructions  You were seen and examined today by Dr. Delton Coombes.  Your scans are stable. You will continue the current treatment of [place] and Faslodex.  Follow-up as scheduled.  Thank you for choosing [facility] to provide your oncology and hematology care.   To afford each patient quality time with our provider, please arrive at least 15 minutes before your scheduled appointment time. You may need to reschedule your appointment if you arrive late (10 or more minutes). Arriving late affects you and other patients whose appointments are after yours.  Also, if you miss three or more appointments without notifying the office, you may be dismissed from the clinic at the provider's discretion.    Again, thank you for choosing [facility].  Our hope is that these requests will decrease the amount of time that you wait before being seen by our physicians.   If you have a lab appointment with the [facility] please come in thru the Main Entrance and check in at the main information desk.           _____________________________________________________________  Should you have questions after your visit to [facility], please contact our office at [phone] and follow the prompts.  Our office hours are 8:00 a.m. to 4:30 p.m. Monday - Thursday and 8:00 a.m. to 2:30 p.m. Friday.  Please note that voicemails left after 4:00 p.m. may not be returned until the following business day.  We are closed weekends and all major holidays.  You do have access to a nurse 24-7, just call the main number to the clinic [phone] and do not press any options, hold on the line and a nurse will answer the phone.    For prescription refill requests, have your pharmacy contact our office and allow 72 hours.    Masks are optional in the cancer centers. If you would like for your care team to wear a mask  while they are taking care of you, please let them know. You may have one support person who is at least 60 years old accompany you for your appointments.

## 2022-10-23 LAB — CANCER ANTIGEN 27.29: CA 27.29: 31.9 U/mL (ref 0.0–38.6)

## 2022-10-23 LAB — CANCER ANTIGEN 15-3: CA 15-3: 27 U/mL — ABNORMAL HIGH (ref 0.0–25.0)

## 2022-11-07 ENCOUNTER — Other Ambulatory Visit: Payer: Medicaid Other

## 2022-11-07 ENCOUNTER — Ambulatory Visit: Payer: Medicaid Other

## 2022-11-19 ENCOUNTER — Inpatient Hospital Stay: Payer: Medicaid Other | Attending: Hematology

## 2022-11-19 ENCOUNTER — Inpatient Hospital Stay: Payer: Medicaid Other

## 2022-11-19 VITALS — BP 147/82 | HR 73 | Resp 16 | Wt 189.0 lb

## 2022-11-19 DIAGNOSIS — Z170 Estrogen receptor positive status [ER+]: Secondary | ICD-10-CM | POA: Diagnosis not present

## 2022-11-19 DIAGNOSIS — Z5111 Encounter for antineoplastic chemotherapy: Secondary | ICD-10-CM | POA: Diagnosis not present

## 2022-11-19 DIAGNOSIS — C50912 Malignant neoplasm of unspecified site of left female breast: Secondary | ICD-10-CM | POA: Diagnosis not present

## 2022-11-19 DIAGNOSIS — C50919 Malignant neoplasm of unspecified site of unspecified female breast: Secondary | ICD-10-CM

## 2022-11-19 DIAGNOSIS — C7951 Secondary malignant neoplasm of bone: Secondary | ICD-10-CM | POA: Insufficient documentation

## 2022-11-19 LAB — CBC WITH DIFFERENTIAL/PLATELET
Abs Immature Granulocytes: 0 10*3/uL (ref 0.00–0.07)
Basophils Absolute: 0.1 10*3/uL (ref 0.0–0.1)
Basophils Relative: 2 %
Eosinophils Absolute: 0.1 10*3/uL (ref 0.0–0.5)
Eosinophils Relative: 4 %
HCT: 36.2 % (ref 36.0–46.0)
Hemoglobin: 12.6 g/dL (ref 12.0–15.0)
Lymphocytes Relative: 46 %
Lymphs Abs: 1.3 10*3/uL (ref 0.7–4.0)
MCH: 35.7 pg — ABNORMAL HIGH (ref 26.0–34.0)
MCHC: 34.8 g/dL (ref 30.0–36.0)
MCV: 102.5 fL — ABNORMAL HIGH (ref 80.0–100.0)
Monocytes Absolute: 0.1 10*3/uL (ref 0.1–1.0)
Monocytes Relative: 5 %
Neutro Abs: 1.2 10*3/uL — ABNORMAL LOW (ref 1.7–7.7)
Neutrophils Relative %: 43 %
Platelets: 293 10*3/uL (ref 150–400)
RBC: 3.53 MIL/uL — ABNORMAL LOW (ref 3.87–5.11)
RDW: 13.3 % (ref 11.5–15.5)
WBC Morphology: >10
WBC: 2.8 10*3/uL — ABNORMAL LOW (ref 4.0–10.5)
nRBC: 0 % (ref 0.0–0.2)

## 2022-11-19 LAB — COMPREHENSIVE METABOLIC PANEL
ALT: 64 U/L — ABNORMAL HIGH (ref 0–44)
AST: 51 U/L — ABNORMAL HIGH (ref 15–41)
Albumin: 3.9 g/dL (ref 3.5–5.0)
Alkaline Phosphatase: 58 U/L (ref 38–126)
Anion gap: 9 (ref 5–15)
BUN: 14 mg/dL (ref 6–20)
CO2: 24 mmol/L (ref 22–32)
Calcium: 9.2 mg/dL (ref 8.9–10.3)
Chloride: 109 mmol/L (ref 98–111)
Creatinine, Ser: 1 mg/dL (ref 0.44–1.00)
GFR, Estimated: >60 mL/min (ref >60)
Glucose, Bld: 84 mg/dL (ref 70–99)
Potassium: 3.7 mmol/L (ref 3.5–5.1)
Sodium: 142 mmol/L (ref 135–145)
Total Bilirubin: 0.5 mg/dL (ref 0.3–1.2)
Total Protein: 7.2 g/dL (ref 6.5–8.1)

## 2022-11-19 MED ORDER — DENOSUMAB 120 MG/1.7ML ~~LOC~~ SOLN
120.0000 mg | Freq: Once | SUBCUTANEOUS | Status: AC
  Administered 2022-11-19: 120 mg via SUBCUTANEOUS
  Filled 2022-11-19: qty 1.7

## 2022-11-19 MED ORDER — FULVESTRANT 250 MG/5ML IM SOSY
500.0000 mg | PREFILLED_SYRINGE | Freq: Once | INTRAMUSCULAR | Status: AC
  Administered 2022-11-19: 500 mg via INTRAMUSCULAR
  Filled 2022-11-19: qty 10

## 2022-11-19 NOTE — Patient Instructions (Signed)
[facility]  Discharge Instructions: Thank you for choosing [facility] to provide your oncology and hematology care.  If you have a lab appointment with the [facility], please come in thru the Main Entrance and check in at the main information desk.  Wear comfortable clothing and clothing appropriate for easy access to any Portacath or PICC line.   We strive to give you quality time with your provider. You may need to reschedule your appointment if you arrive late (15 or more minutes).  Arriving late affects you and other patients whose appointments are after yours.  Also, if you miss three or more appointments without notifying the office, you may be dismissed from the clinic at the provider's discretion.      For prescription refill requests, have your pharmacy contact our office and allow 72 hours for refills to be completed.    Today you received the following Flaslodex and xgeva, return as scheduled.  To help prevent nausea and vomiting after your treatment, we encourage you to take your nausea medication as directed.  BELOW ARE SYMPTOMS THAT SHOULD BE REPORTED IMMEDIATELY: *FEVER GREATER THAN 100.4 F (38 C) OR HIGHER *CHILLS OR SWEATING *NAUSEA AND VOMITING THAT IS NOT CONTROLLED WITH YOUR NAUSEA MEDICATION *UNUSUAL SHORTNESS OF BREATH *UNUSUAL BRUISING OR BLEEDING *URINARY PROBLEMS (pain or burning when urinating, or frequent urination) *BOWEL PROBLEMS (unusual diarrhea, constipation, pain near the anus) TENDERNESS IN MOUTH AND THROAT WITH OR WITHOUT PRESENCE OF ULCERS (sore throat, sores in mouth, or a toothache) UNUSUAL RASH, SWELLING OR PAIN  UNUSUAL VAGINAL DISCHARGE OR ITCHING   Items with * indicate a potential emergency and should be followed up as soon as possible or go to the Emergency Department if any problems should occur.  Please show the CHEMOTHERAPY ALERT CARD or IMMUNOTHERAPY ALERT CARD at check-in to the Emergency Department  and triage nurse.  Should you have questions after your visit or need to cancel or reschedule your appointment, please contact [facility] [phone]  and follow the prompts.  Office hours are 8:00 a.m. to 4:30 p.m. Monday - Friday. Please note that voicemails left after 4:00 p.m. may not be returned until the following business day.  We are closed weekends and major holidays. You have access to a nurse at all times for urgent questions. Please call the main number to the clinic [phone] and follow the prompts.  For any non-urgent questions, you may also contact your provider using MyChart. We now offer e-Visits for anyone 23 and older to request care online for non-urgent symptoms. For details visit [email or web address].   Also download the MyChart app! Go to the app store, search "MyChart", open the app, select [facility], and log in with your MyChart username and password.  Masks are optional in the cancer centers. If you would like for your care team to wear a mask while they are taking care of you, please let them know. You may have one support person who is at least 60 years old accompany you for your appointments.

## 2022-11-19 NOTE — Progress Notes (Signed)
Patient tolerated Faslodex injection with no complaints voiced. Bilateral sites clean and dry with no bruising or swelling noted. Band aids applied. See MAR for details. Patient stable during and after injections.  Patient taking calcium as directed. Denied tooth, jaw, and leg pain. No recent or upcoming dental visits. Labs reviewed. Patient tolerated injection with no complaints voiced. See MAR for details. Patient stable during and after injection. Site clean and dry with no bruising or swelling noted. Band aid applied. Vss with discharge and left in satisfactory condition with no s/s of distress.

## 2022-12-05 ENCOUNTER — Ambulatory Visit: Payer: Medicaid Other

## 2022-12-05 ENCOUNTER — Ambulatory Visit: Payer: Medicaid Other | Admitting: Hematology

## 2022-12-05 ENCOUNTER — Other Ambulatory Visit: Payer: Medicaid Other

## 2022-12-17 ENCOUNTER — Inpatient Hospital Stay: Payer: Medicaid Other | Attending: Hematology

## 2022-12-17 ENCOUNTER — Inpatient Hospital Stay: Payer: Medicaid Other

## 2022-12-17 VITALS — BP 139/84 | HR 77 | Temp 98.3°F | Resp 16

## 2022-12-17 DIAGNOSIS — Z7982 Long term (current) use of aspirin: Secondary | ICD-10-CM | POA: Diagnosis not present

## 2022-12-17 DIAGNOSIS — C50912 Malignant neoplasm of unspecified site of left female breast: Secondary | ICD-10-CM | POA: Insufficient documentation

## 2022-12-17 DIAGNOSIS — Z170 Estrogen receptor positive status [ER+]: Secondary | ICD-10-CM | POA: Diagnosis not present

## 2022-12-17 DIAGNOSIS — C50919 Malignant neoplasm of unspecified site of unspecified female breast: Secondary | ICD-10-CM

## 2022-12-17 DIAGNOSIS — Z5111 Encounter for antineoplastic chemotherapy: Secondary | ICD-10-CM | POA: Insufficient documentation

## 2022-12-17 DIAGNOSIS — Z79899 Other long term (current) drug therapy: Secondary | ICD-10-CM | POA: Insufficient documentation

## 2022-12-17 DIAGNOSIS — C7951 Secondary malignant neoplasm of bone: Secondary | ICD-10-CM | POA: Insufficient documentation

## 2022-12-17 DIAGNOSIS — F17210 Nicotine dependence, cigarettes, uncomplicated: Secondary | ICD-10-CM | POA: Diagnosis not present

## 2022-12-17 LAB — COMPREHENSIVE METABOLIC PANEL
ALT: 66 U/L — ABNORMAL HIGH (ref 0–44)
AST: 48 U/L — ABNORMAL HIGH (ref 15–41)
Albumin: 4.1 g/dL (ref 3.5–5.0)
Alkaline Phosphatase: 57 U/L (ref 38–126)
Anion gap: 10 (ref 5–15)
BUN: 16 mg/dL (ref 6–20)
CO2: 24 mmol/L (ref 22–32)
Calcium: 9.5 mg/dL (ref 8.9–10.3)
Chloride: 106 mmol/L (ref 98–111)
Creatinine, Ser: 0.91 mg/dL (ref 0.44–1.00)
GFR, Estimated: >60 mL/min (ref >60)
Glucose, Bld: 97 mg/dL (ref 70–99)
Potassium: 3.7 mmol/L (ref 3.5–5.1)
Sodium: 140 mmol/L (ref 135–145)
Total Bilirubin: 0.7 mg/dL (ref 0.3–1.2)
Total Protein: 7.6 g/dL (ref 6.5–8.1)

## 2022-12-17 LAB — CBC WITH DIFFERENTIAL/PLATELET
Abs Immature Granulocytes: 0.01 10*3/uL (ref 0.00–0.07)
Basophils Absolute: 0 10*3/uL (ref 0.0–0.1)
Basophils Relative: 1 %
Eosinophils Absolute: 0 10*3/uL (ref 0.0–0.5)
Eosinophils Relative: 1 %
HCT: 39.3 % (ref 36.0–46.0)
Hemoglobin: 13.3 g/dL (ref 12.0–15.0)
Immature Granulocytes: 0 %
Lymphocytes Relative: 34 %
Lymphs Abs: 1.1 10*3/uL (ref 0.7–4.0)
MCH: 35.1 pg — ABNORMAL HIGH (ref 26.0–34.0)
MCHC: 33.8 g/dL (ref 30.0–36.0)
MCV: 103.7 fL — ABNORMAL HIGH (ref 80.0–100.0)
Monocytes Absolute: 0.2 10*3/uL (ref 0.1–1.0)
Monocytes Relative: 7 %
Neutro Abs: 1.8 10*3/uL (ref 1.7–7.7)
Neutrophils Relative %: 57 %
Platelets: 320 10*3/uL (ref 150–400)
RBC: 3.79 MIL/uL — ABNORMAL LOW (ref 3.87–5.11)
RDW: 13.3 % (ref 11.5–15.5)
WBC: 3.1 10*3/uL — ABNORMAL LOW (ref 4.0–10.5)
nRBC: 0 % (ref 0.0–0.2)

## 2022-12-17 MED ORDER — FULVESTRANT 250 MG/5ML IM SOSY
500.0000 mg | PREFILLED_SYRINGE | Freq: Once | INTRAMUSCULAR | Status: AC
  Administered 2022-12-17: 500 mg via INTRAMUSCULAR
  Filled 2022-12-17: qty 10

## 2022-12-17 MED ORDER — DENOSUMAB 120 MG/1.7ML ~~LOC~~ SOLN
120.0000 mg | Freq: Once | SUBCUTANEOUS | Status: AC
  Administered 2022-12-17: 120 mg via SUBCUTANEOUS
  Filled 2022-12-17: qty 1.7

## 2022-12-17 NOTE — Patient Instructions (Signed)
[facility]  Discharge Instructions: Thank you for choosing [facility] to provide your oncology and hematology care.  If you have a lab appointment with the Cancer Center, please come in thru the Main Entrance and check in at the main information desk.  Wear comfortable clothing and clothing appropriate for easy access to any Portacath or PICC line.   We strive to give you quality time with your provider. You may need to reschedule your appointment if you arrive late (15 or more minutes).  Arriving late affects you and other patients whose appointments are after yours.  Also, if you miss three or more appointments without notifying the office, you may be dismissed from the clinic at the provider's discretion.      For prescription refill requests, have your pharmacy contact our office and allow 72 hours for refills to be completed.     To help prevent nausea and vomiting after your treatment, we encourage you to take your nausea medication as directed.  BELOW ARE SYMPTOMS THAT SHOULD BE REPORTED IMMEDIATELY: *FEVER GREATER THAN 100.4 F (38 C) OR HIGHER *CHILLS OR SWEATING *NAUSEA AND VOMITING THAT IS NOT CONTROLLED WITH YOUR NAUSEA MEDICATION *UNUSUAL SHORTNESS OF BREATH *UNUSUAL BRUISING OR BLEEDING *URINARY PROBLEMS (pain or burning when urinating, or frequent urination) *BOWEL PROBLEMS (unusual diarrhea, constipation, pain near the anus) TENDERNESS IN MOUTH AND THROAT WITH OR WITHOUT PRESENCE OF ULCERS (sore throat, sores in mouth, or a toothache) UNUSUAL RASH, SWELLING OR PAIN  UNUSUAL VAGINAL DISCHARGE OR ITCHING   Items with * indicate a potential emergency and should be followed up as soon as possible or go to the Emergency Department if any problems should occur.  Please show the CHEMOTHERAPY ALERT CARD or IMMUNOTHERAPY ALERT CARD at check-in to the Emergency Department and triage nurse.  Should you have questions after your visit or need to  cancel or reschedule your appointment, please contact [facility] [phone]  and follow the prompts.  Office hours are 8:00 a.m. to 4:30 p.m. Monday - Friday. Please note that voicemails left after 4:00 p.m. may not be returned until the following business day.  We are closed weekends and major holidays. You have access to a nurse at all times for urgent questions. Please call the main number to the clinic [phone] and follow the prompts.  For any non-urgent questions, you may also contact your provider using MyChart. We now offer e-Visits for anyone 18 and older to request care online for non-urgent symptoms. For details visit [email or web address].   Also download the MyChart app! Go to the app store, search "MyChart", open the app, select [facility], and log in with your MyChart username and password.  Masks are optional in the cancer centers. If you would like for your care team to wear a mask while they are taking care of you, please let them know. You may have one support person who is at least 60 years old accompany you for your appointments.  

## 2022-12-17 NOTE — Progress Notes (Signed)
Patient tolerated Faslodex and Xgeva injections with no complaints voiced.  Site clean and dry with no bruising or swelling noted.  No complaints of pain.  Discharged with vital signs stable and no signs or symptoms of distress noted.  

## 2022-12-25 ENCOUNTER — Other Ambulatory Visit: Payer: Self-pay | Admitting: Hematology

## 2022-12-31 ENCOUNTER — Other Ambulatory Visit (INDEPENDENT_AMBULATORY_CARE_PROVIDER_SITE_OTHER): Payer: Self-pay | Admitting: Gastroenterology

## 2022-12-31 DIAGNOSIS — R1310 Dysphagia, unspecified: Secondary | ICD-10-CM

## 2022-12-31 DIAGNOSIS — K219 Gastro-esophageal reflux disease without esophagitis: Secondary | ICD-10-CM

## 2023-01-01 ENCOUNTER — Other Ambulatory Visit (INDEPENDENT_AMBULATORY_CARE_PROVIDER_SITE_OTHER): Payer: Self-pay | Admitting: Gastroenterology

## 2023-01-01 DIAGNOSIS — K219 Gastro-esophageal reflux disease without esophagitis: Secondary | ICD-10-CM

## 2023-01-01 DIAGNOSIS — R1310 Dysphagia, unspecified: Secondary | ICD-10-CM

## 2023-01-07 ENCOUNTER — Inpatient Hospital Stay: Payer: Medicaid Other | Attending: Hematology

## 2023-01-07 DIAGNOSIS — M8580 Other specified disorders of bone density and structure, unspecified site: Secondary | ICD-10-CM | POA: Insufficient documentation

## 2023-01-07 DIAGNOSIS — C50912 Malignant neoplasm of unspecified site of left female breast: Secondary | ICD-10-CM | POA: Diagnosis not present

## 2023-01-07 DIAGNOSIS — R1310 Dysphagia, unspecified: Secondary | ICD-10-CM | POA: Insufficient documentation

## 2023-01-07 DIAGNOSIS — Z5111 Encounter for antineoplastic chemotherapy: Secondary | ICD-10-CM | POA: Insufficient documentation

## 2023-01-07 DIAGNOSIS — F17210 Nicotine dependence, cigarettes, uncomplicated: Secondary | ICD-10-CM | POA: Insufficient documentation

## 2023-01-07 DIAGNOSIS — G629 Polyneuropathy, unspecified: Secondary | ICD-10-CM | POA: Insufficient documentation

## 2023-01-07 DIAGNOSIS — Z923 Personal history of irradiation: Secondary | ICD-10-CM | POA: Diagnosis not present

## 2023-01-07 DIAGNOSIS — G4700 Insomnia, unspecified: Secondary | ICD-10-CM | POA: Diagnosis not present

## 2023-01-07 DIAGNOSIS — Z7982 Long term (current) use of aspirin: Secondary | ICD-10-CM | POA: Diagnosis not present

## 2023-01-07 DIAGNOSIS — C7951 Secondary malignant neoplasm of bone: Secondary | ICD-10-CM | POA: Diagnosis not present

## 2023-01-07 DIAGNOSIS — Z170 Estrogen receptor positive status [ER+]: Secondary | ICD-10-CM | POA: Diagnosis not present

## 2023-01-07 DIAGNOSIS — Z79899 Other long term (current) drug therapy: Secondary | ICD-10-CM | POA: Diagnosis not present

## 2023-01-07 DIAGNOSIS — C50919 Malignant neoplasm of unspecified site of unspecified female breast: Secondary | ICD-10-CM

## 2023-01-07 DIAGNOSIS — Z803 Family history of malignant neoplasm of breast: Secondary | ICD-10-CM | POA: Diagnosis not present

## 2023-01-07 LAB — COMPREHENSIVE METABOLIC PANEL
ALT: 57 U/L — ABNORMAL HIGH (ref 0–44)
AST: 44 U/L — ABNORMAL HIGH (ref 15–41)
Albumin: 3.9 g/dL (ref 3.5–5.0)
Alkaline Phosphatase: 61 U/L (ref 38–126)
Anion gap: 8 (ref 5–15)
BUN: 16 mg/dL (ref 6–20)
CO2: 23 mmol/L (ref 22–32)
Calcium: 9.4 mg/dL (ref 8.9–10.3)
Chloride: 107 mmol/L (ref 98–111)
Creatinine, Ser: 1.01 mg/dL — ABNORMAL HIGH (ref 0.44–1.00)
GFR, Estimated: >60 mL/min (ref >60)
Glucose, Bld: 104 mg/dL — ABNORMAL HIGH (ref 70–99)
Potassium: 4 mmol/L (ref 3.5–5.1)
Sodium: 138 mmol/L (ref 135–145)
Total Bilirubin: 0.8 mg/dL (ref 0.3–1.2)
Total Protein: 7.2 g/dL (ref 6.5–8.1)

## 2023-01-07 LAB — CBC WITH DIFFERENTIAL/PLATELET
Abs Immature Granulocytes: 0 10*3/uL (ref 0.00–0.07)
Basophils Absolute: 0 10*3/uL (ref 0.0–0.1)
Basophils Relative: 1 %
Eosinophils Absolute: 0 10*3/uL (ref 0.0–0.5)
Eosinophils Relative: 1 %
HCT: 38.3 % (ref 36.0–46.0)
Hemoglobin: 13.3 g/dL (ref 12.0–15.0)
Lymphocytes Relative: 36 %
Lymphs Abs: 1.2 10*3/uL (ref 0.7–4.0)
MCH: 35.6 pg — ABNORMAL HIGH (ref 26.0–34.0)
MCHC: 34.7 g/dL (ref 30.0–36.0)
MCV: 102.4 fL — ABNORMAL HIGH (ref 80.0–100.0)
Monocytes Absolute: 0.2 10*3/uL (ref 0.1–1.0)
Monocytes Relative: 5 %
Neutro Abs: 1.9 10*3/uL (ref 1.7–7.7)
Neutrophils Relative %: 57 %
Platelets: 301 10*3/uL (ref 150–400)
RBC: 3.74 MIL/uL — ABNORMAL LOW (ref 3.87–5.11)
RDW: 13.3 % (ref 11.5–15.5)
WBC: 3.3 10*3/uL — ABNORMAL LOW (ref 4.0–10.5)
nRBC: 0 % (ref 0.0–0.2)

## 2023-01-07 LAB — MAGNESIUM: Magnesium: 2 mg/dL (ref 1.7–2.4)

## 2023-01-08 LAB — CANCER ANTIGEN 27.29: CA 27.29: 32.3 U/mL (ref 0.0–38.6)

## 2023-01-09 LAB — CANCER ANTIGEN 15-3: CA 15-3: 32.6 U/mL — ABNORMAL HIGH (ref 0.0–25.0)

## 2023-01-14 ENCOUNTER — Inpatient Hospital Stay: Payer: Medicaid Other | Admitting: Hematology

## 2023-01-14 ENCOUNTER — Inpatient Hospital Stay: Payer: Medicaid Other

## 2023-01-14 ENCOUNTER — Other Ambulatory Visit: Payer: Self-pay

## 2023-01-14 ENCOUNTER — Encounter: Payer: Self-pay | Admitting: Hematology

## 2023-01-14 VITALS — BP 129/72 | HR 81 | Temp 98.5°F | Resp 18 | Wt 186.4 lb

## 2023-01-14 DIAGNOSIS — M8580 Other specified disorders of bone density and structure, unspecified site: Secondary | ICD-10-CM | POA: Diagnosis not present

## 2023-01-14 DIAGNOSIS — Z7982 Long term (current) use of aspirin: Secondary | ICD-10-CM | POA: Diagnosis not present

## 2023-01-14 DIAGNOSIS — G4700 Insomnia, unspecified: Secondary | ICD-10-CM | POA: Diagnosis not present

## 2023-01-14 DIAGNOSIS — Z803 Family history of malignant neoplasm of breast: Secondary | ICD-10-CM | POA: Diagnosis not present

## 2023-01-14 DIAGNOSIS — Z79899 Other long term (current) drug therapy: Secondary | ICD-10-CM | POA: Diagnosis not present

## 2023-01-14 DIAGNOSIS — Z5111 Encounter for antineoplastic chemotherapy: Secondary | ICD-10-CM | POA: Diagnosis not present

## 2023-01-14 DIAGNOSIS — G629 Polyneuropathy, unspecified: Secondary | ICD-10-CM | POA: Diagnosis not present

## 2023-01-14 DIAGNOSIS — C7951 Secondary malignant neoplasm of bone: Secondary | ICD-10-CM | POA: Diagnosis not present

## 2023-01-14 DIAGNOSIS — C50919 Malignant neoplasm of unspecified site of unspecified female breast: Secondary | ICD-10-CM

## 2023-01-14 DIAGNOSIS — R1310 Dysphagia, unspecified: Secondary | ICD-10-CM | POA: Diagnosis not present

## 2023-01-14 DIAGNOSIS — C50912 Malignant neoplasm of unspecified site of left female breast: Secondary | ICD-10-CM | POA: Diagnosis not present

## 2023-01-14 DIAGNOSIS — F17210 Nicotine dependence, cigarettes, uncomplicated: Secondary | ICD-10-CM | POA: Diagnosis not present

## 2023-01-14 DIAGNOSIS — Z170 Estrogen receptor positive status [ER+]: Secondary | ICD-10-CM | POA: Diagnosis not present

## 2023-01-14 DIAGNOSIS — Z923 Personal history of irradiation: Secondary | ICD-10-CM | POA: Diagnosis not present

## 2023-01-14 MED ORDER — CYCLOBENZAPRINE HCL 10 MG PO TABS: 10.0000 mg | ORAL_TABLET | Freq: Two times a day (BID) | ORAL | 5 refills | Status: DC | PRN

## 2023-01-14 MED ORDER — DENOSUMAB 120 MG/1.7ML ~~LOC~~ SOLN
120.0000 mg | Freq: Once | SUBCUTANEOUS | Status: AC
  Administered 2023-01-14: 120 mg via SUBCUTANEOUS
  Filled 2023-01-14: qty 1.7

## 2023-01-14 MED ORDER — ZOLPIDEM TARTRATE 5 MG PO TABS: 5.0000 mg | ORAL_TABLET | Freq: Every evening | ORAL | 0 refills | Status: DC | PRN

## 2023-01-14 MED ORDER — FULVESTRANT 250 MG/5ML IM SOSY
500.0000 mg | PREFILLED_SYRINGE | Freq: Once | INTRAMUSCULAR | Status: AC
  Administered 2023-01-14: 500 mg via INTRAMUSCULAR
  Filled 2023-01-14: qty 10

## 2023-01-14 NOTE — Progress Notes (Signed)
[place], [place] [address]   CLINIC:  Medical Oncology/Hematology  PCP:  Johnette Abraham, MD [address] / [place] [address] [phone]   REASON FOR VISIT:  Follow-up for metastatic breast cancer  PRIOR THERAPY: none  NGS Results: not done  CURRENT THERAPY: Palbociclib + Fulvestrant every 4 weeks  BRIEF ONCOLOGIC HISTORY:  Oncology History  Primary malignant neoplasm of breast with metastasis ([place])  07/06/2021 Initial Diagnosis   Metastatic breast cancer ([place])   07/18/2021 - 08/15/2022 Chemotherapy   Patient is on Treatment Plan : BREAST Palbociclib + Fulvestrant q28d     07/18/2021 -  Chemotherapy   Patient is on Treatment Plan : BREAST Palbociclib D1-21 + Fulvestrant q28d       CANCER STAGING:  Cancer Staging  No matching staging information was found for the patient.  INTERVAL HISTORY:  Ms. TENIKA KEERAN, a 62 y.o. female, seen for follow-up of metastatic breast cancer.  She is tolerating Ibrance reasonably well.  Reports decreased energy levels.  Has chronic pain in the chest wall region from prior radiation and surgery.  She complains of difficulty falling and staying asleep and has tried melatonin and trazodone in the past without help.  REVIEW OF SYSTEMS:  Review of Systems  Respiratory:  Positive for shortness of breath.   Cardiovascular:  Positive for chest pain.  Gastrointestinal:  Positive for constipation.  Musculoskeletal:  Positive for arthralgias (Hip pains).  Neurological:  Positive for headaches and numbness (hands - stable).  Psychiatric/Behavioral:  The patient is nervous/anxious.   All other systems reviewed and are negative.   PAST MEDICAL/SURGICAL HISTORY:  Past Medical History:  Diagnosis Date   Allergy    Asthma    Asthma    diagnosed age 77   Bone metastasis 08/2020   was on prolia   Breast cancer ([place])    started chemo 05/05/2019. finished 08/2019   Gangrene ([place])    GERD  (gastroesophageal reflux disease)    Hypertension    Hypertension    started bp meds 2008   Lymphedema of arm    left arm   Osteopenia    Shingles 04/20/2020   left side   Past Surgical History:  Procedure Laterality Date   BIOPSY  01/22/2022   Procedure: BIOPSY;  Surgeon: Montez Morita, Quillian Quince, MD;  [facility];  Service: Gastroenterology;;   Wilmon Pali RELEASE Bilateral 2006   ESOPHAGOGASTRODUODENOSCOPY (EGD) WITH PROPOFOL N/A 01/22/2022   Procedure: ESOPHAGOGASTRODUODENOSCOPY (EGD) WITH PROPOFOL;  Surgeon: Harvel Quale, MD;  Location: [facility];  Service: Gastroenterology;  Laterality: N/A;  205   hysteroscopy     age 42   mastectomy Bilateral 10/22/2019   pt had necrosis after 1st surgery, had another surgery to remove implants 12/10/2019   MOUTH SURGERY     TUBAL LIGATION     TUBAL LIGATION Bilateral 1998   WISDOM TOOTH EXTRACTION     early 30's    SOCIAL HISTORY:  Social History   Socioeconomic History   Marital status: Married    Spouse name: Not on file   Number of children: 2   Years of education: Not on file   Highest education level: Not on file  Occupational History   Occupation: Disability  Tobacco Use   Smoking status: Every Day    Packs/day: 0.50    Years: 40.00    Total pack years: 20.00    Types: Cigarettes  Smokeless tobacco: Never   Tobacco comments:    Smokes half a pack a day for 40 years.   Vaping Use   Vaping Use: Never used  Substance and Sexual Activity   Alcohol use: Never   Drug use: Never   Sexual activity: Yes  Other Topics Concern   Not on file  Social History Narrative   Lives with her husband.    Social Determinants of Health   Financial Resource Strain: Low Risk  (07/03/2021)   Overall Financial Resource Strain (CARDIA)    Difficulty of Paying Living Expenses: Not hard at all  Food Insecurity: No Food Insecurity (07/03/2021)   Hunger Vital Sign    Worried About Running Out of Food in the  Last Year: Never true    Ran Out of Food in the Last Year: Never true  Transportation Needs: No Transportation Needs (07/03/2021)   PRAPARE - Hydrologist (Medical): No    Lack of Transportation (Non-Medical): No  Physical Activity: Insufficiently Active (07/03/2021)   Exercise Vital Sign    Days of Exercise per Week: 5 days    Minutes of Exercise per Session: 10 min  Stress: No Stress Concern Present (07/03/2021)   [place]    Feeling of Stress : Only a little  Social Connections: Moderately Isolated (07/03/2021)   Social Connection and Isolation Panel [NHANES]    Frequency of Communication with Friends and Family: Three times a week    Frequency of Social Gatherings with Friends and Family: Once a week    Attends Religious Services: Never    Marine scientist or Organizations: No    Attends Archivist Meetings: Never    Marital Status: Married  Human resources officer Violence: Not At Risk (07/03/2021)   Humiliation, Afraid, Rape, and Kick questionnaire    Fear of Current or Ex-Partner: No    Emotionally Abused: No    Physically Abused: No    Sexually Abused: No    FAMILY HISTORY:  Family History  Problem Relation Age of Onset   Leukemia Mother    Breast cancer Sister    Diabetes Maternal Uncle    Hyperlipidemia Maternal Uncle    Diabetes Mellitus II Maternal Grandmother    Heart disease Maternal Grandfather    Diabetes Maternal Grandfather    Breast cancer Cousin    Breast cancer Half-Sister    Osteoporosis Half-Sister    Lupus Half-Sister    Lupus Half-Sister    Osteoporosis Half-Sister    Cancer - Lung Neg Hx    Colon cancer Neg Hx     CURRENT MEDICATIONS:  Current Outpatient Medications  Medication Sig Dispense Refill   amLODipine (NORVASC) 5 MG tablet Take 1 tablet (5 mg total) by mouth daily. 90 tablet 2   Ascorbic Acid (VITAMIN C) 1000 MG tablet Take 1,000 mg by  mouth daily.     aspirin 81 MG EC tablet Take 81 mg by mouth daily.     butalbital-acetaminophen-caffeine (FIORICET) 50-325-40 MG tablet Take 1 tablet by mouth every 4 (four) hours as needed for headache. 30 tablet 0   Calcium-Magnesium-Vitamin D (CALCIUM 1200+D3 PO) Take 1 tablet by mouth daily.     cetirizine-pseudoephedrine (ZYRTEC-D) 5-120 MG tablet Take 1 tablet by mouth daily.     CINNAMON PO Take 1,000 mg by mouth daily.     cyanocobalamin 1000 MCG tablet Take 1 tablet (1,000 mcg total) by mouth daily. (Patient  taking differently: Take 1,000 mcg by mouth every other day.) 30 tablet 6   denosumab (XGEVA) 120 MG/1.7ML SOLN injection Inject 120 mg into the skin every 30 (thirty) days.     fulvestrant (FASLODEX) 250 MG/5ML injection Inject 500 mg into the muscle every 30 (thirty) days. One injection each buttock over 1-2 minutes. Warm prior to use.     furosemide (LASIX) 20 MG tablet Take 1 tablet (20 mg total) by mouth daily as needed. 30 tablet 0   IBRANCE 100 MG tablet TAKE 1 TABLET BY MOUTH 1 TIME A DAY ON DAYS 1 TO 21 OF A 28 DAY CYCLE 21 tablet 4   lansoprazole (PREVACID) 30 MG capsule TAKE 1 CAPSULE(30 MG) BY MOUTH IN THE MORNING AND AT BEDTIME 90 capsule 0   Misc. Devices MISC Please provide patient with left arm compression sleeve. Diagnosis metastatic breast cancer, bilateral mastectomy, left arm lymphedema 1 each 0   NAPHCON-A 0.025-0.3 % ophthalmic solution Place 2 drops into both eyes in the morning and at bedtime.     predniSONE (DELTASONE) 50 MG tablet Take 1 tab 13hrs, 7hrs and 1hr prior to scan. 3 tablet 0   PROAIR HFA 108 (90 Base) MCG/ACT inhaler Inhale 2 puffs into the lungs every 4 (four) hours as needed for wheezing or shortness of breath.     cyclobenzaprine (FLEXERIL) 10 MG tablet Take 1 tablet (10 mg total) by mouth 2 (two) times daily as needed for muscle spasms. 60 tablet 5   diphenhydrAMINE (BENADRYL) 50 MG tablet Take 1 tablet (50 mg total) by mouth once for 1 dose.  1hr prior to scan 1 tablet 0   No current facility-administered medications for this visit.    ALLERGIES:  Allergies  Allergen Reactions   Augmentin [Amoxicillin-Pot Clavulanate] Anaphylaxis   Ciprofloxacin Anaphylaxis   Claritin [Loratadine] Anaphylaxis   Gabapentin Anaphylaxis   Keflex [Cephalexin] Anaphylaxis   Levofloxacin Anaphylaxis   Lisinopril Swelling   Lisinopril Anaphylaxis   Metoprolol Swelling   Metoprolol Anaphylaxis   Nexium [Esomeprazole] Anaphylaxis   Sulfa Antibiotics Hives and Swelling   Tetracyclines & Related Anaphylaxis   Iohexol Hives    Needs pre meds   Ceftin [Cefuroxime Axetil] Hives   Chromium Other (See Comments)    headache   Misc. Sulfonamide Containing Compounds Other (See Comments)   Oxycodone Itching   Sulfa Antibiotics     Possible Reaction    Latex Rash    itching   Nickel Rash    itching    PHYSICAL EXAM:  Performance status (ECOG): 0 - Asymptomatic  Vitals:   01/14/23 1401  BP: 129/72  Pulse: 81  Resp: 18  Temp: 98.5 F (36.9 C)  SpO2: 98%   Wt Readings from Last 3 Encounters:  01/14/23 186 lb 6.4 oz (84.6 kg)  11/19/22 189 lb (85.7 kg)  10/22/22 186 lb 1.6 oz (84.4 kg)   Physical Exam Vitals reviewed.  Constitutional:      Appearance: Normal appearance. She is obese.  Cardiovascular:     Rate and Rhythm: Normal rate and regular rhythm.     Pulses: Normal pulses.     Heart sounds: Normal heart sounds.  Pulmonary:     Effort: Pulmonary effort is normal.     Breath sounds: Normal breath sounds.  Lymphadenopathy:     Cervical: No cervical adenopathy.     Right cervical: No superficial, deep or posterior cervical adenopathy.    Left cervical: No superficial, deep or posterior cervical adenopathy.  Upper Body:     Right upper body: No supraclavicular or axillary adenopathy.     Left upper body: No supraclavicular or axillary adenopathy.  Neurological:     General: No focal deficit present.     Mental Status:  She is alert and oriented to person, place, and time.  Psychiatric:        Mood and Affect: Mood normal.        Behavior: Behavior normal.     LABORATORY DATA:  I have reviewed the labs as listed.     Latest Ref Rng & Units 01/07/2023    1:47 PM 12/17/2022   12:46 PM 11/19/2022    1:30 PM  CBC  WBC 4.0 - 10.5 K/uL 3.3  3.1  2.8   Hemoglobin 12.0 - 15.0 g/dL 13.3  13.3  12.6   Hematocrit 36.0 - 46.0 % 38.3  39.3  36.2   Platelets 150 - 400 K/uL 301  320  293       Latest Ref Rng & Units 01/07/2023    1:47 PM 12/17/2022   12:46 PM 11/19/2022    1:30 PM  CMP  Glucose 70 - 99 mg/dL 104  97  84   BUN 6 - 20 mg/dL '16  16  14   '$ Creatinine 0.44 - 1.00 mg/dL 1.01  0.91  1.00   Sodium 135 - 145 mmol/L 138  140  142   Potassium 3.5 - 5.1 mmol/L 4.0  3.7  3.7   Chloride 98 - 111 mmol/L 107  106  109   CO2 22 - 32 mmol/L '23  24  24   '$ Calcium 8.9 - 10.3 mg/dL 9.4  9.5  9.2   Total Protein 6.5 - 8.1 g/dL 7.2  7.6  7.2   Total Bilirubin 0.3 - 1.2 mg/dL 0.8  0.7  0.5   Alkaline Phos 38 - 126 U/L 61  57  58   AST 15 - 41 U/L 44  48  51   ALT 0 - 44 U/L 57  66  64     DIAGNOSTIC IMAGING:  I have independently reviewed the scans and discussed with the patient. No results found.   ASSESSMENT:  1.  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 - Started on Ibrance and Faslodex in December 2021 for metastatic disease. - PET scan on 08/10/2021 with no evidence of hypermetabolic metastatic disease.  Widespread scattered sclerotic bone metastasis in the spine and pelvis are stable and not metabolically active.  No lymphadenopathy.  Asymmetric muscular uptake in the left lower scalene muscles and in the intercostal muscles of the ventral upper left chest wall without discrete mass  correlate on the CT images, favoring activity related uptake. Leslee Home was dose reduced to 100 mg 3 weeks on/1 week off on 10/11/2021 due to fatigue.  2.  Social/family history: - She is a retired Building control surveyor.  She recently moved back from [place]. - Younger sister had breast cancer in her 86s.  Mother died of leukemia at age 75.  Paternal cousin also had breast cancer.  3.  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  She underwent immediate implants which were later removed on 12/10/2019 secondary to infection and necrosis. - XRT to the chest wall and regional lymph nodes, [address] [place], from 02/29/2020 through 04/12/2020 - She was treated with adjuvant anastrozole which was started 12/09/2019 and was found to have metastatic disease in November 2021. - Previous genetic testing was reportedly negative and [place].   PLAN:  1.  Metastatic breast cancer to the bones, ER/PR positive, HER2 negative: - CT CAP on 10/04/2022 showed unchanged sclerotic  bone mets with no new or progressive metastatic disease. - Bone scan on 09/16/2022: Right mid rib focus of increased activity again noted.  Tiny new focus of increased activity in the upper lumbar vertebral body cannot be excluded. - Reviewed labs from 01/07/2023 which showed elevated AST and ALT are stable.  CBC shows mild leukopenia from [place].  Rest of the CBC was normal. - CA 15-3 has slightly increased to 32.6 but within her normal range.  CA 27-29 is normal. - Continue Ibrance 100 mg 3 weeks on/1 week off along with monthly Faslodex. - RTC 3 months with repeat  CT CAP and bone scan and tumor markers.   2.  Peripheral neuropathy: - Constant tingling/numbness in the hands is stable.  No pharmacological intervention.  3.  Bone metastatic disease: - Continue calcium and vitamin D supplements.  Calcium is 9.4.  Continue denosumab today and monthly.  4.  Sleeplessness: - She has tried melatonin and trazodone which did not help. - We will give a trial of Ambien 5 mg at bedtime as needed.  5.  Dysphagia: - Last EGD on 01/22/2022 with biopsy showed nonspecific reactive gastropathy. - She has occasional dysphagia.  She will follow-up with Dr. Jenetta Downer if it gets worse.  6.  Body pains: - Continue Flexeril as needed.  She is not requiring hydrocodone.   Orders placed this encounter:  Orders Placed This Encounter  Procedures   CT CHEST ABDOMEN PELVIS W CONTRAST   NM Bone Scan Whole Body   CBC with Differential   Comprehensive metabolic panel   Magnesium   Cancer antigen 15-3   Cancer antigen 27.29      Derek Jack, MD [facility] [phone]

## 2023-01-14 NOTE — Patient Instructions (Addendum)
[facility]  Discharge Instructions  You were seen and examined today by Dr. Delton Coombes.  Your labs are stable. Continue with Faslodex and Xgeva as planned.  Dr. Delton Coombes has recommended a repeat CT scan and bone scan in 3 months.  Follow-up as scheduled.  Thank you for choosing [facility] to provide your oncology and hematology care.   To afford each patient quality time with our provider, please arrive at least 15 minutes before your scheduled appointment time. You may need to reschedule your appointment if you arrive late (10 or more minutes). Arriving late affects you and other patients whose appointments are after yours.  Also, if you miss three or more appointments without notifying the office, you may be dismissed from the clinic at the provider's discretion.    Again, thank you for choosing [facility].  Our hope is that these requests will decrease the amount of time that you wait before being seen by our physicians.   If you have a lab appointment with the [facility] please come in thru the Main Entrance and check in at the main information desk.           _____________________________________________________________  Should you have questions after your visit to [facility], please contact our office at [phone] and follow the prompts.  Our office hours are 8:00 a.m. to 4:30 p.m. Monday - Thursday and 8:00 a.m. to 2:30 p.m. Friday.  Please note that voicemails left after 4:00 p.m. may not be returned until the following business day.  We are closed weekends and all major holidays.  You do have access to a nurse 24-7, just call the main number to the clinic [phone] and do not press any options, hold on the line and a nurse will answer the phone.    For prescription refill requests, have your pharmacy contact our office and allow 72 hours.    Masks are optional in the cancer centers.  If you would like for your care team to wear a mask while they are taking care of you, please let them know. You may have one support person who is at least 61 years old accompany you for your appointments.

## 2023-01-14 NOTE — Progress Notes (Signed)
Patient presents today for Xgeva and Faslodex  injections per providers order.  Vital signs WNL.  Calcium on 01/07/23 was 9.4.   Message received from [facility]/Dr. Delton Coombes patient okay for injections.  Patient currently taking Calcium and vitamin D supplements, has had no prior or upcoming dental work and no jaw pain.   Stable during administration without incident; injection site WNL; see MAR for injection details.  Patient tolerated procedure well and without incident.  No questions or complaints noted at this time.

## 2023-01-14 NOTE — Progress Notes (Signed)
Patient is taking Ibrance as prescribed.  She has not missed any doses and reports no side effects at this time.   Patient has been assessed, vital signs and labs have been reviewed by Dr. Delton Coombes. ANC, Creatinine, LFTs, and Platelets are within treatment parameters per Dr. Delton Coombes. The patient is good to proceed with treatment at this time. Primary RN and pharmacy aware.

## 2023-01-14 NOTE — Patient Instructions (Signed)
[facility]  Discharge Instructions: Thank you for choosing [facility] to provide your oncology and hematology care.  If you have a lab appointment with the [facility], please come in thru the Main Entrance and check in at the main information desk.  Wear comfortable clothing and clothing appropriate for easy access to any Portacath or PICC line.   We strive to give you quality time with your provider. You may need to reschedule your appointment if you arrive late (15 or more minutes).  Arriving late affects you and other patients whose appointments are after yours.  Also, if you miss three or more appointments without notifying the office, you may be dismissed from the clinic at the provider's discretion.      For prescription refill requests, have your pharmacy contact our office and allow 72 hours for refills to be completed.    Today you received the following chemotherapy and/or immunotherapy agents Faslodex and Xgeva      To help prevent nausea and vomiting after your treatment, we encourage you to take your nausea medication as directed.  BELOW ARE SYMPTOMS THAT SHOULD BE REPORTED IMMEDIATELY: *FEVER GREATER THAN 100.4 F (38 C) OR HIGHER *CHILLS OR SWEATING *NAUSEA AND VOMITING THAT IS NOT CONTROLLED WITH YOUR NAUSEA MEDICATION *UNUSUAL SHORTNESS OF BREATH *UNUSUAL BRUISING OR BLEEDING *URINARY PROBLEMS (pain or burning when urinating, or frequent urination) *BOWEL PROBLEMS (unusual diarrhea, constipation, pain near the anus) TENDERNESS IN MOUTH AND THROAT WITH OR WITHOUT PRESENCE OF ULCERS (sore throat, sores in mouth, or a toothache) UNUSUAL RASH, SWELLING OR PAIN  UNUSUAL VAGINAL DISCHARGE OR ITCHING   Items with * indicate a potential emergency and should be followed up as soon as possible or go to the Emergency Department if any problems should occur.  Please show the CHEMOTHERAPY ALERT CARD or IMMUNOTHERAPY ALERT CARD at check-in to  the Emergency Department and triage nurse.  Should you have questions after your visit or need to cancel or reschedule your appointment, please contact [facility] [phone]  and follow the prompts.  Office hours are 8:00 a.m. to 4:30 p.m. Monday - Friday. Please note that voicemails left after 4:00 p.m. may not be returned until the following business day.  We are closed weekends and major holidays. You have access to a nurse at all times for urgent questions. Please call the main number to the clinic [phone] and follow the prompts.  For any non-urgent questions, you may also contact your provider using MyChart. We now offer e-Visits for anyone 73 and older to request care online for non-urgent symptoms. For details visit [email or web address].   Also download the MyChart app! Go to the app store, search "MyChart", open the app, select [facility], and log in with your MyChart username and password.

## 2023-01-15 ENCOUNTER — Other Ambulatory Visit: Payer: Self-pay

## 2023-01-16 ENCOUNTER — Encounter: Payer: Self-pay | Admitting: *Deleted

## 2023-01-16 ENCOUNTER — Other Ambulatory Visit: Payer: Self-pay | Admitting: *Deleted

## 2023-01-16 MED ORDER — ESZOPICLONE 1 MG PO TABS: 1.0000 mg | ORAL_TABLET | Freq: Every evening | ORAL | 0 refills | Status: DC | PRN

## 2023-01-16 NOTE — Progress Notes (Signed)
Insurance denied Ambien, therefore Dr. Delton Coombes will send in [place] for her insomnia.

## 2023-01-17 ENCOUNTER — Other Ambulatory Visit: Payer: Self-pay

## 2023-02-11 ENCOUNTER — Inpatient Hospital Stay: Payer: 59

## 2023-02-11 ENCOUNTER — Encounter: Payer: Self-pay | Admitting: Hematology

## 2023-02-11 ENCOUNTER — Inpatient Hospital Stay: Payer: 59 | Attending: Hematology

## 2023-02-11 DIAGNOSIS — C50919 Malignant neoplasm of unspecified site of unspecified female breast: Secondary | ICD-10-CM

## 2023-02-11 DIAGNOSIS — F17210 Nicotine dependence, cigarettes, uncomplicated: Secondary | ICD-10-CM | POA: Insufficient documentation

## 2023-02-11 DIAGNOSIS — C7951 Secondary malignant neoplasm of bone: Secondary | ICD-10-CM | POA: Insufficient documentation

## 2023-02-11 DIAGNOSIS — Z5111 Encounter for antineoplastic chemotherapy: Secondary | ICD-10-CM | POA: Diagnosis not present

## 2023-02-11 DIAGNOSIS — Z7982 Long term (current) use of aspirin: Secondary | ICD-10-CM | POA: Diagnosis not present

## 2023-02-11 DIAGNOSIS — Z79899 Other long term (current) drug therapy: Secondary | ICD-10-CM | POA: Diagnosis not present

## 2023-02-11 DIAGNOSIS — C50912 Malignant neoplasm of unspecified site of left female breast: Secondary | ICD-10-CM | POA: Insufficient documentation

## 2023-02-11 DIAGNOSIS — Z170 Estrogen receptor positive status [ER+]: Secondary | ICD-10-CM | POA: Diagnosis not present

## 2023-02-11 DIAGNOSIS — G629 Polyneuropathy, unspecified: Secondary | ICD-10-CM | POA: Diagnosis not present

## 2023-02-11 LAB — COMPREHENSIVE METABOLIC PANEL
ALT: 58 U/L — ABNORMAL HIGH (ref 0–44)
AST: 45 U/L — ABNORMAL HIGH (ref 15–41)
Albumin: 4.2 g/dL (ref 3.5–5.0)
Alkaline Phosphatase: 55 U/L (ref 38–126)
Anion gap: 10 (ref 5–15)
BUN: 21 mg/dL — ABNORMAL HIGH (ref 6–20)
CO2: 24 mmol/L (ref 22–32)
Calcium: 9.7 mg/dL (ref 8.9–10.3)
Chloride: 105 mmol/L (ref 98–111)
Creatinine, Ser: 0.99 mg/dL (ref 0.44–1.00)
GFR, Estimated: >60 mL/min (ref >60)
Glucose, Bld: 88 mg/dL (ref 70–99)
Potassium: 3.9 mmol/L (ref 3.5–5.1)
Sodium: 139 mmol/L (ref 135–145)
Total Bilirubin: 0.5 mg/dL (ref 0.3–1.2)
Total Protein: 7.6 g/dL (ref 6.5–8.1)

## 2023-02-12 ENCOUNTER — Encounter: Payer: Self-pay | Admitting: Hematology

## 2023-02-12 ENCOUNTER — Other Ambulatory Visit: Payer: Self-pay | Admitting: *Deleted

## 2023-02-12 ENCOUNTER — Telehealth: Payer: Self-pay

## 2023-02-12 ENCOUNTER — Other Ambulatory Visit: Payer: Self-pay

## 2023-02-12 ENCOUNTER — Other Ambulatory Visit (HOSPITAL_COMMUNITY): Payer: Self-pay

## 2023-02-12 MED ORDER — PALBOCICLIB 100 MG PO TABS: ORAL_TABLET | ORAL | 4 refills | Status: DC

## 2023-02-12 NOTE — Telephone Encounter (Signed)
Oral Oncology Pharmacist Encounter   Prior Authorization for Wendy Zamora has been approved.     PA# [id number] Effective dates: 02/12/2023 through 02/12/2024   Oral Oncology Clinic will continue to follow.   Patient informed that prescription is required to be filled by [facility]. Patient was provided the phone number to [facility].   Laray Anger, PharmD PGY-2 Pharmacy Resident Hematology/Oncology [phone]  02/12/2023 12:32 PM

## 2023-02-12 NOTE — Telephone Encounter (Signed)
Oral Oncology Pharmacist Encounter   Received notification from office RN that patient had a change of insurance and needed a reauthorization for her [facility].   PA submitted on CMM Key [id number] Status is pending   Oral Oncology Clinic will continue to follow.   Laray Anger, PharmD PGY-2 Pharmacy Resident Hematology/Oncology [phone]  02/12/2023 11:13 AM

## 2023-02-13 ENCOUNTER — Other Ambulatory Visit (HOSPITAL_COMMUNITY): Payer: Self-pay

## 2023-02-18 ENCOUNTER — Inpatient Hospital Stay: Payer: 59

## 2023-02-18 VITALS — BP 136/82 | HR 84 | Temp 97.1°F | Resp 18

## 2023-02-18 DIAGNOSIS — C50919 Malignant neoplasm of unspecified site of unspecified female breast: Secondary | ICD-10-CM

## 2023-02-18 DIAGNOSIS — Z5111 Encounter for antineoplastic chemotherapy: Secondary | ICD-10-CM | POA: Diagnosis not present

## 2023-02-18 DIAGNOSIS — Z170 Estrogen receptor positive status [ER+]: Secondary | ICD-10-CM | POA: Diagnosis not present

## 2023-02-18 DIAGNOSIS — Z79899 Other long term (current) drug therapy: Secondary | ICD-10-CM | POA: Diagnosis not present

## 2023-02-18 DIAGNOSIS — F17210 Nicotine dependence, cigarettes, uncomplicated: Secondary | ICD-10-CM | POA: Diagnosis not present

## 2023-02-18 DIAGNOSIS — C50912 Malignant neoplasm of unspecified site of left female breast: Secondary | ICD-10-CM | POA: Diagnosis not present

## 2023-02-18 DIAGNOSIS — Z7982 Long term (current) use of aspirin: Secondary | ICD-10-CM | POA: Diagnosis not present

## 2023-02-18 DIAGNOSIS — C7951 Secondary malignant neoplasm of bone: Secondary | ICD-10-CM | POA: Diagnosis not present

## 2023-02-18 DIAGNOSIS — G629 Polyneuropathy, unspecified: Secondary | ICD-10-CM | POA: Diagnosis not present

## 2023-02-18 MED ORDER — DENOSUMAB 120 MG/1.7ML ~~LOC~~ SOLN
120.0000 mg | Freq: Once | SUBCUTANEOUS | Status: AC
  Administered 2023-02-18: 120 mg via SUBCUTANEOUS
  Filled 2023-02-18: qty 1.7

## 2023-02-18 MED ORDER — FULVESTRANT 250 MG/5ML IM SOSY
500.0000 mg | PREFILLED_SYRINGE | Freq: Once | INTRAMUSCULAR | Status: AC
  Administered 2023-02-18: 500 mg via INTRAMUSCULAR
  Filled 2023-02-18: qty 10

## 2023-02-18 NOTE — Progress Notes (Signed)
No knew issues noted today. Patient denies any jaw pain or needing any dental work, states she is taking her calcium and vitamin D. Will proceed with injections as ordered.    Faslodex and xgeva injections given per orders. Patient tolerated it well without problems. Vitals stable and discharged home from clinic ambulatory. Follow up as scheduled.

## 2023-02-18 NOTE — Patient Instructions (Signed)
[facility]  Discharge Instructions: Thank you for choosing [facility] to provide your oncology and hematology care.  If you have a lab appointment with the [facility], please come in thru the Main Entrance and check in at the main information desk.  Wear comfortable clothing and clothing appropriate for easy access to any Portacath or PICC line.   We strive to give you quality time with your provider. You may need to reschedule your appointment if you arrive late (15 or more minutes).  Arriving late affects you and other patients whose appointments are after yours.  Also, if you miss three or more appointments without notifying the office, you may be dismissed from the clinic at the provider's discretion.      For prescription refill requests, have your pharmacy contact our office and allow 72 hours for refills to be completed.    Today you received the following, faslodex injections and xgeva injection   To help prevent nausea and vomiting after your treatment, we encourage you to take your nausea medication as directed.  BELOW ARE SYMPTOMS THAT SHOULD BE REPORTED IMMEDIATELY: *FEVER GREATER THAN 100.4 F (38 C) OR HIGHER *CHILLS OR SWEATING *NAUSEA AND VOMITING THAT IS NOT CONTROLLED WITH YOUR NAUSEA MEDICATION *UNUSUAL SHORTNESS OF BREATH *UNUSUAL BRUISING OR BLEEDING *URINARY PROBLEMS (pain or burning when urinating, or frequent urination) *BOWEL PROBLEMS (unusual diarrhea, constipation, pain near the anus) TENDERNESS IN MOUTH AND THROAT WITH OR WITHOUT PRESENCE OF ULCERS (sore throat, sores in mouth, or a toothache) UNUSUAL RASH, SWELLING OR PAIN  UNUSUAL VAGINAL DISCHARGE OR ITCHING   Items with * indicate a potential emergency and should be followed up as soon as possible or go to the Emergency Department if any problems should occur.  Please show the CHEMOTHERAPY ALERT CARD or IMMUNOTHERAPY ALERT CARD at check-in to the Emergency Department  and triage nurse.  Should you have questions after your visit or need to cancel or reschedule your appointment, please contact [facility] [phone]  and follow the prompts.  Office hours are 8:00 a.m. to 4:30 p.m. Monday - Friday. Please note that voicemails left after 4:00 p.m. may not be returned until the following business day.  We are closed weekends and major holidays. You have access to a nurse at all times for urgent questions. Please call the main number to the clinic [phone] and follow the prompts.  For any non-urgent questions, you may also contact your provider using MyChart. We now offer e-Visits for anyone 30 and older to request care online for non-urgent symptoms. For details visit [email or web address].   Also download the MyChart app! Go to the app store, search "MyChart", open the app, select [facility], and log in with your MyChart username and password.

## 2023-02-21 ENCOUNTER — Other Ambulatory Visit: Payer: Self-pay

## 2023-02-28 ENCOUNTER — Other Ambulatory Visit (INDEPENDENT_AMBULATORY_CARE_PROVIDER_SITE_OTHER): Payer: Self-pay

## 2023-02-28 DIAGNOSIS — K219 Gastro-esophageal reflux disease without esophagitis: Secondary | ICD-10-CM

## 2023-02-28 DIAGNOSIS — R1310 Dysphagia, unspecified: Secondary | ICD-10-CM

## 2023-02-28 MED ORDER — LANSOPRAZOLE 30 MG PO CPDR: DELAYED_RELEASE_CAPSULE | ORAL | 0 refills | Status: DC

## 2023-02-28 NOTE — Progress Notes (Signed)
Prescription sent

## 2023-02-28 NOTE — Progress Notes (Signed)
Last seen 04/18/22

## 2023-03-01 ENCOUNTER — Other Ambulatory Visit: Payer: Self-pay

## 2023-03-04 ENCOUNTER — Ambulatory Visit (INDEPENDENT_AMBULATORY_CARE_PROVIDER_SITE_OTHER): Payer: 59 | Admitting: Internal Medicine

## 2023-03-04 ENCOUNTER — Other Ambulatory Visit: Payer: Self-pay

## 2023-03-04 ENCOUNTER — Encounter: Payer: Self-pay | Admitting: Internal Medicine

## 2023-03-04 ENCOUNTER — Other Ambulatory Visit: Payer: Self-pay | Admitting: Internal Medicine

## 2023-03-04 VITALS — BP 138/84 | HR 98 | Ht 64.5 in | Wt 184.2 lb

## 2023-03-04 DIAGNOSIS — M7918 Myalgia, other site: Secondary | ICD-10-CM

## 2023-03-04 DIAGNOSIS — K219 Gastro-esophageal reflux disease without esophagitis: Secondary | ICD-10-CM | POA: Diagnosis not present

## 2023-03-04 DIAGNOSIS — G8929 Other chronic pain: Secondary | ICD-10-CM | POA: Diagnosis not present

## 2023-03-04 DIAGNOSIS — I10 Essential (primary) hypertension: Secondary | ICD-10-CM | POA: Diagnosis not present

## 2023-03-04 DIAGNOSIS — J4520 Mild intermittent asthma, uncomplicated: Secondary | ICD-10-CM

## 2023-03-04 DIAGNOSIS — G4700 Insomnia, unspecified: Secondary | ICD-10-CM

## 2023-03-04 MED ORDER — ESZOPICLONE 1 MG PO TABS: 1.0000 mg | ORAL_TABLET | Freq: Every evening | ORAL | 0 refills | Status: DC | PRN

## 2023-03-04 MED ORDER — PROAIR HFA 108 (90 BASE) MCG/ACT IN AERS: 2.0000 | INHALATION_SPRAY | RESPIRATORY_TRACT | 3 refills | Status: DC | PRN

## 2023-03-04 NOTE — Progress Notes (Signed)
Established Patient Office Visit  Subjective   Patient ID: Wendy Zamora, female    DOB: 1961-03-17  Age: 62 y.o. MRN: [id number]  Chief Complaint  Patient presents with   Hypertension    Follow up   Hyperlipidemia    Follow up   Wendy Zamora returns to care today for follow up.  She was last seen at [facility] on 09/03/22 by Vena Rua, NP.  No medication changes were made at that time.  In the interim she has been seen by oncology (Dr. Delton Coombes) for follow-up.  There have otherwise been no acute interval events.  Today Wendy Zamora endorses chronic, diffuse musculoskeletal pain as well as insomnia.  She is interested in available treatment options.  [name] was recently prescribed by oncology for treatment of insomnia, however her insurance changed and she has not filled the prescription.  She does not have any additional concerns to discuss today.  Past Medical History:  Diagnosis Date   Allergy    Asthma    Asthma    diagnosed age 65   Bone metastasis 08/2020   was on prolia   Breast cancer ([place])    started chemo 05/05/2019. finished 08/2019   Gangrene ([place])    GERD (gastroesophageal reflux disease)    Hypertension    Hypertension    started bp meds 2008   Lymphedema of arm    left arm   Osteopenia    Shingles 04/20/2020   left side   Past Surgical History:  Procedure Laterality Date   BIOPSY  01/22/2022   Procedure: BIOPSY;  Surgeon: Montez Morita, Quillian Quince, MD;  Location: [facility];  Service: Gastroenterology;;   Wilmon Pali RELEASE Bilateral 2006   ESOPHAGOGASTRODUODENOSCOPY (EGD) WITH PROPOFOL N/A 01/22/2022   Procedure: ESOPHAGOGASTRODUODENOSCOPY (EGD) WITH PROPOFOL;  Surgeon: Harvel Quale, MD;  Location: [facility];  Service: Gastroenterology;  Laterality: N/A;  205   hysteroscopy     age 71   mastectomy Bilateral 10/22/2019   pt had necrosis after 1st surgery, had another surgery to remove implants 12/10/2019   MOUTH SURGERY     TUBAL  LIGATION     TUBAL LIGATION Bilateral 1998   WISDOM TOOTH EXTRACTION     early 38's   Social History   Tobacco Use   Smoking status: Every Day    Packs/day: 0.50    Years: 40.00    Total pack years: 20.00    Types: Cigarettes   Smokeless tobacco: Never   Tobacco comments:    Smokes half a pack a day for 40 years.   Vaping Use   Vaping Use: Never used  Substance Use Topics   Alcohol use: Never   Drug use: Never   Family History  Problem Relation Age of Onset   Leukemia Mother    Breast cancer Sister    Diabetes Maternal Uncle    Hyperlipidemia Maternal Uncle    Diabetes Mellitus II Maternal Grandmother    Heart disease Maternal Grandfather    Diabetes Maternal Grandfather    Breast cancer Cousin    Breast cancer Half-Sister    Osteoporosis Half-Sister    Lupus Half-Sister    Lupus Half-Sister    Osteoporosis Half-Sister    Cancer - Lung Neg Hx    Colon cancer Neg Hx    Allergies  Allergen Reactions   Augmentin [Amoxicillin-Pot Clavulanate] Anaphylaxis   Ciprofloxacin Anaphylaxis   Claritin [Loratadine] Anaphylaxis   Gabapentin Anaphylaxis   Keflex [Cephalexin] Anaphylaxis  Levofloxacin Anaphylaxis   Lisinopril Swelling   Lisinopril Anaphylaxis   Metoprolol Swelling   Metoprolol Anaphylaxis   Nexium [Esomeprazole] Anaphylaxis   Sulfa Antibiotics Hives and Swelling   Tetracyclines & Related Anaphylaxis   Iohexol Hives    Needs pre meds   Ceftin [Cefuroxime Axetil] Hives   Chromium Other (See Comments)    headache   Iodinated Contrast Media Hives   Misc. Sulfonamide Containing Compounds Other (See Comments)   Oxycodone Itching   Sulfa Antibiotics     Possible Reaction    Latex Rash    itching   Nickel Rash    itching   Review of Systems  Constitutional:  Negative for chills and fever.  HENT:  Negative for sore throat.   Respiratory:  Positive for cough (occasional) and shortness of breath (chronic).   Cardiovascular:  Negative for chest pain,  palpitations and leg swelling.  Gastrointestinal:  Negative for abdominal pain, blood in stool, constipation, diarrhea, nausea and vomiting.  Genitourinary:  Negative for dysuria and hematuria.  Musculoskeletal:  Positive for joint pain and myalgias.  Skin:  Negative for itching and rash.  Neurological:  Negative for dizziness and headaches.  Psychiatric/Behavioral:  Negative for depression and suicidal ideas.       Objective:     BP 138/84   Pulse 98   Ht 5' 4.5" (1.638 m)   Wt 184 lb 3.2 oz (83.6 kg)   LMP 06/08/2012   SpO2 96%   BMI 31.13 kg/m  BP Readings from Last 3 Encounters:  03/04/23 138/84  02/18/23 136/82  01/14/23 129/72      Physical Exam Vitals reviewed.  Constitutional:      General: She is not in acute distress.    Appearance: Normal appearance. She is obese. She is not toxic-appearing.  HENT:     Head: Normocephalic and atraumatic.     Right Ear: External ear normal.     Left Ear: External ear normal.     Nose: Nose normal. No congestion or rhinorrhea.     Mouth/Throat:     Mouth: Mucous membranes are moist.     Pharynx: Oropharynx is clear. No oropharyngeal exudate or posterior oropharyngeal erythema.  Eyes:     General: No scleral icterus.    Extraocular Movements: Extraocular movements intact.     Conjunctiva/sclera: Conjunctivae normal.     Pupils: Pupils are equal, round, and reactive to light.  Cardiovascular:     Rate and Rhythm: Normal rate and regular rhythm.     Pulses: Normal pulses.     Heart sounds: Normal heart sounds. No murmur heard.    No friction rub. No gallop.  Pulmonary:     Effort: Pulmonary effort is normal.     Breath sounds: Normal breath sounds. No wheezing, rhonchi or rales.  Abdominal:     General: Abdomen is flat. Bowel sounds are normal. There is no distension.     Palpations: Abdomen is soft.     Tenderness: There is no abdominal tenderness.  Musculoskeletal:        General: No swelling. Normal range of motion.      Cervical back: Normal range of motion.     Right lower leg: No edema.     Left lower leg: No edema.     Comments: Compression sleeve on left arm  Lymphadenopathy:     Cervical: No cervical adenopathy.  Skin:    General: Skin is warm and dry.     Capillary Refill: Capillary  refill takes less than 2 seconds.     Coloration: Skin is not jaundiced.  Neurological:     General: No focal deficit present.     Mental Status: She is alert and oriented to person, place, and time.  Psychiatric:        Mood and Affect: Mood normal.        Behavior: Behavior normal.   Last CBC Lab Results  Component Value Date   WBC 3.3 (L) 01/07/2023   HGB 13.3 01/07/2023   HCT 38.3 01/07/2023   MCV 102.4 (H) 01/07/2023   MCH 35.6 (H) 01/07/2023   RDW 13.3 01/07/2023   PLT 301 0000000   Last metabolic panel Lab Results  Component Value Date   GLUCOSE 88 02/11/2023   NA 139 02/11/2023   K 3.9 02/11/2023   CL 105 02/11/2023   CO2 24 02/11/2023   BUN 21 (H) 02/11/2023   CREATININE 0.99 02/11/2023   GFRNONAA >60 02/11/2023   CALCIUM 9.7 02/11/2023   PROT 7.6 02/11/2023   ALBUMIN 4.2 02/11/2023   BILITOT 0.5 02/11/2023   ALKPHOS 55 02/11/2023   AST 45 (H) 02/11/2023   ALT 58 (H) 02/11/2023   ANIONGAP 10 02/11/2023   Last lipids Lab Results  Component Value Date   CHOL 228 (H) 04/12/2022   HDL 55 04/12/2022   LDLCALC 147 (H) 04/12/2022   TRIG 143 04/12/2022   CHOLHDL 4.1 04/12/2022   Last hemoglobin A1c Lab Results  Component Value Date   HGBA1C 5.5 04/12/2022   Last thyroid functions Lab Results  Component Value Date   TSH 1.150 04/12/2022   Last vitamin D Lab Results  Component Value Date   VD25OH 32.3 04/12/2022   Last vitamin B12 and Folate Lab Results  Component Value Date   [id number] 01/03/2022   The 10-year ASCVD risk score (Arnett DK, et al., 2019) is: 12.5%    Assessment & Plan:   Problem List Items Addressed This Visit       Essential  hypertension    She is currently prescribed amlodipine 5 mg daily for hypertension.  Her blood pressure today was 142/81 initially and 138/84 on repeat.  Home readings are typically lower. -No medication changes today.  BP is acceptable.      Asthma - Primary    Asymptomatic currently.  Unremarkable pulmonary exam today.  Albuterol inhaler has been refilled.      GERD (gastroesophageal reflux disease)    Symptoms are currently well-controlled on Prevacid.  No medication changes today.      Insomnia    Currently endorses insomnia.  She has tried melatonin and trazodone previously.  [name] was recently prescribed by oncology but she has not filled the medication because her insurance changed and the prescription needs to go to be different pharmacy. -New prescription for Lunesta sent to her preferred pharmacy      Chronic musculoskeletal pain    She endorses diffuse, chronic musculoskeletal pain that has worsened recently.  She is interested in available treatment options. -I recommended starting Cymbalta, however she is concerned that she may have had an adverse reaction this medication previously.  Per review of prior documentation, she was treated with Cymbalta in 2021 in the setting of postherpetic neuralgia.  Do not see a documented reason why the medication was discontinued or any reported adverse side effects. -Will defer starting Cymbalta for now as she will be starting Lunesta for insomnia.  We will tentatively plan for follow-up in 3 months,  however she will notify us before this date if she would like to start Cymbalta in the interim.       Return in about 3 months (around 06/04/2023).    Johnette Abraham, MD

## 2023-03-04 NOTE — Assessment & Plan Note (Signed)
She endorses diffuse, chronic musculoskeletal pain that has worsened recently.  She is interested in available treatment options. -I recommended starting Cymbalta, however she is concerned that she may have had an adverse reaction this medication previously.  Per review of prior documentation, she was treated with Cymbalta in 2021 in the setting of postherpetic neuralgia.  Do not see a documented reason why the medication was discontinued or any reported adverse side effects. -Will defer starting Cymbalta for now as she will be starting Lunesta for insomnia.  We will tentatively plan for follow-up in 3 months, however she will notify us before this date if she would like to start Cymbalta in the interim.

## 2023-03-04 NOTE — Assessment & Plan Note (Signed)
Asymptomatic currently.  Unremarkable pulmonary exam today.  Albuterol inhaler has been refilled.

## 2023-03-04 NOTE — Assessment & Plan Note (Signed)
She is currently prescribed amlodipine 5 mg daily for hypertension.  Her blood pressure today was 142/81 initially and 138/84 on repeat.  Home readings are typically lower. -No medication changes today.  BP is acceptable.

## 2023-03-04 NOTE — Assessment & Plan Note (Signed)
Currently endorses insomnia.  She has tried melatonin and trazodone previously.  Wendy Zamora was recently prescribed by oncology but she has not filled the medication because her insurance changed and the prescription needs to go to be different pharmacy. -New prescription for Lunesta sent to her preferred pharmacy

## 2023-03-04 NOTE — Assessment & Plan Note (Signed)
Symptoms are currently well-controlled on Prevacid.  No medication changes today.

## 2023-03-04 NOTE — Addendum Note (Signed)
Addended by: Marland Kitchen E on: 03/04/2023 03:30 PM   Modules accepted: Orders

## 2023-03-04 NOTE — Patient Instructions (Signed)
It was a pleasure to see you today.  Thank you for giving [place] the opportunity to be involved in your care.  Below is a brief recap of your visit and next steps.  We will plan to see you again in 3 months.  Summary Start Lunesta for sleep Albuterol inhaler refilled Follow up in 3 months.

## 2023-03-05 ENCOUNTER — Other Ambulatory Visit: Payer: Self-pay

## 2023-03-11 ENCOUNTER — Inpatient Hospital Stay: Payer: 59

## 2023-03-18 ENCOUNTER — Other Ambulatory Visit: Payer: Self-pay

## 2023-03-18 ENCOUNTER — Other Ambulatory Visit: Payer: Self-pay | Admitting: Hematology

## 2023-03-18 ENCOUNTER — Inpatient Hospital Stay: Payer: 59 | Attending: Hematology

## 2023-03-18 ENCOUNTER — Inpatient Hospital Stay: Payer: 59

## 2023-03-18 VITALS — BP 142/98 | HR 92 | Temp 97.8°F | Resp 20

## 2023-03-18 DIAGNOSIS — C50919 Malignant neoplasm of unspecified site of unspecified female breast: Secondary | ICD-10-CM

## 2023-03-18 DIAGNOSIS — C7951 Secondary malignant neoplasm of bone: Secondary | ICD-10-CM | POA: Diagnosis not present

## 2023-03-18 DIAGNOSIS — Z170 Estrogen receptor positive status [ER+]: Secondary | ICD-10-CM | POA: Insufficient documentation

## 2023-03-18 DIAGNOSIS — Z91041 Radiographic dye allergy status: Secondary | ICD-10-CM

## 2023-03-18 DIAGNOSIS — C50912 Malignant neoplasm of unspecified site of left female breast: Secondary | ICD-10-CM | POA: Insufficient documentation

## 2023-03-18 DIAGNOSIS — Z79818 Long term (current) use of other agents affecting estrogen receptors and estrogen levels: Secondary | ICD-10-CM | POA: Diagnosis not present

## 2023-03-18 DIAGNOSIS — Z5111 Encounter for antineoplastic chemotherapy: Secondary | ICD-10-CM | POA: Insufficient documentation

## 2023-03-18 LAB — COMPREHENSIVE METABOLIC PANEL
ALT: 57 U/L — ABNORMAL HIGH (ref 0–44)
AST: 41 U/L (ref 15–41)
Albumin: 4.1 g/dL (ref 3.5–5.0)
Alkaline Phosphatase: 58 U/L (ref 38–126)
Anion gap: 8 (ref 5–15)
BUN: 18 mg/dL (ref 8–23)
CO2: 25 mmol/L (ref 22–32)
Calcium: 9.8 mg/dL (ref 8.9–10.3)
Chloride: 105 mmol/L (ref 98–111)
Creatinine, Ser: 0.93 mg/dL (ref 0.44–1.00)
GFR, Estimated: >60 mL/min (ref >60)
Glucose, Bld: 97 mg/dL (ref 70–99)
Potassium: 3.9 mmol/L (ref 3.5–5.1)
Sodium: 138 mmol/L (ref 135–145)
Total Bilirubin: 0.6 mg/dL (ref 0.3–1.2)
Total Protein: 7.6 g/dL (ref 6.5–8.1)

## 2023-03-18 LAB — CBC WITH DIFFERENTIAL/PLATELET
Abs Immature Granulocytes: 0.01 10*3/uL (ref 0.00–0.07)
Basophils Absolute: 0 10*3/uL (ref 0.0–0.1)
Basophils Relative: 1 %
Eosinophils Absolute: 0 10*3/uL (ref 0.0–0.5)
Eosinophils Relative: 1 %
HCT: 38.5 % (ref 36.0–46.0)
Hemoglobin: 13.6 g/dL (ref 12.0–15.0)
Immature Granulocytes: 0 %
Lymphocytes Relative: 36 %
Lymphs Abs: 1 10*3/uL (ref 0.7–4.0)
MCH: 36.2 pg — ABNORMAL HIGH (ref 26.0–34.0)
MCHC: 35.3 g/dL (ref 30.0–36.0)
MCV: 102.4 fL — ABNORMAL HIGH (ref 80.0–100.0)
Monocytes Absolute: 0.3 10*3/uL (ref 0.1–1.0)
Monocytes Relative: 11 %
Neutro Abs: 1.4 10*3/uL — ABNORMAL LOW (ref 1.7–7.7)
Neutrophils Relative %: 51 %
Platelets: 177 10*3/uL (ref 150–400)
RBC: 3.76 MIL/uL — ABNORMAL LOW (ref 3.87–5.11)
RDW: 12.9 % (ref 11.5–15.5)
WBC Morphology: REACTIVE
WBC: 2.8 10*3/uL — ABNORMAL LOW (ref 4.0–10.5)
nRBC: 0 % (ref 0.0–0.2)

## 2023-03-18 MED ORDER — FULVESTRANT 250 MG/5ML IM SOSY
500.0000 mg | PREFILLED_SYRINGE | Freq: Once | INTRAMUSCULAR | Status: AC
  Administered 2023-03-18: 500 mg via INTRAMUSCULAR
  Filled 2023-03-18: qty 10

## 2023-03-18 MED ORDER — TRAMADOL HCL 50 MG PO TABS: 50.0000 mg | ORAL_TABLET | Freq: Four times a day (QID) | ORAL | 0 refills | Status: DC | PRN

## 2023-03-18 MED ORDER — HYDROCODONE-ACETAMINOPHEN 5-325 MG PO TABS: 1.0000 | ORAL_TABLET | Freq: Two times a day (BID) | ORAL | 0 refills | Status: DC | PRN

## 2023-03-18 MED ORDER — PREDNISONE 50 MG PO TABS: ORAL_TABLET | ORAL | 0 refills | Status: DC

## 2023-03-18 NOTE — Progress Notes (Signed)
Patient presents today for Xgeva and faslodex. Patient states she has been still having generalized joint and muscle pain today it is a 8/10. Per Dr. Melanie Crazier xgeva until next visit.  Patient tolerated Faslodex injection with no complaints voiced. Bilateral sites clean and dry with no bruising or swelling noted. Band aids applied. See MAR for details. Patient stable during and after injections. VSS with discharge and left in satisfactory condition with no s/s of distress noted.

## 2023-03-18 NOTE — Patient Instructions (Signed)
[facility]  Discharge Instructions: Thank you for choosing [facility] to provide your oncology and hematology care.  If you have a lab appointment with the Cancer Center, please come in thru the Main Entrance and check in at the main information desk.  Wear comfortable clothing and clothing appropriate for easy access to any Portacath or PICC line.   We strive to give you quality time with your provider. You may need to reschedule your appointment if you arrive late (15 or more minutes).  Arriving late affects you and other patients whose appointments are after yours.  Also, if you miss three or more appointments without notifying the office, you may be dismissed from the clinic at the provider's discretion.      For prescription refill requests, have your pharmacy contact our office and allow 72 hours for refills to be completed.    Today you received the following Faslodex, return as scheduled.    To help prevent nausea and vomiting after your treatment, we encourage you to take your nausea medication as directed.  BELOW ARE SYMPTOMS THAT SHOULD BE REPORTED IMMEDIATELY: *FEVER GREATER THAN 100.4 F (38 C) OR HIGHER *CHILLS OR SWEATING *NAUSEA AND VOMITING THAT IS NOT CONTROLLED WITH YOUR NAUSEA MEDICATION *UNUSUAL SHORTNESS OF BREATH *UNUSUAL BRUISING OR BLEEDING *URINARY PROBLEMS (pain or burning when urinating, or frequent urination) *BOWEL PROBLEMS (unusual diarrhea, constipation, pain near the anus) TENDERNESS IN MOUTH AND THROAT WITH OR WITHOUT PRESENCE OF ULCERS (sore throat, sores in mouth, or a toothache) UNUSUAL RASH, SWELLING OR PAIN  UNUSUAL VAGINAL DISCHARGE OR ITCHING   Items with * indicate a potential emergency and should be followed up as soon as possible or go to the Emergency Department if any problems should occur.  Please show the CHEMOTHERAPY ALERT CARD or IMMUNOTHERAPY ALERT CARD at check-in to the Emergency Department and  triage nurse.  Should you have questions after your visit or need to cancel or reschedule your appointment, please contact [facility] [phone]  and follow the prompts.  Office hours are 8:00 a.m. to 4:30 p.m. Monday - Friday. Please note that voicemails left after 4:00 p.m. may not be returned until the following business day.  We are closed weekends and major holidays. You have access to a nurse at all times for urgent questions. Please call the main number to the clinic [phone] and follow the prompts.  For any non-urgent questions, you may also contact your provider using MyChart. We now offer e-Visits for anyone 18 and older to request care online for non-urgent symptoms. For details visit [email or web address].   Also download the MyChart app! Go to the app store, search "MyChart", open the app, select [facility], and log in with your MyChart username and password.   

## 2023-04-01 ENCOUNTER — Encounter (INDEPENDENT_AMBULATORY_CARE_PROVIDER_SITE_OTHER): Payer: Self-pay | Admitting: Gastroenterology

## 2023-04-01 ENCOUNTER — Other Ambulatory Visit: Payer: Self-pay

## 2023-04-02 ENCOUNTER — Encounter (HOSPITAL_COMMUNITY)
Admission: RE | Admit: 2023-04-02 | Discharge: 2023-04-02 | Disposition: A | Discharge status: Home / Self Care | Payer: 59 | Source: Ambulatory Visit | Attending: Hematology | Admitting: Hematology

## 2023-04-02 ENCOUNTER — Encounter (HOSPITAL_COMMUNITY): Payer: Self-pay

## 2023-04-02 DIAGNOSIS — C50919 Malignant neoplasm of unspecified site of unspecified female breast: Secondary | ICD-10-CM | POA: Insufficient documentation

## 2023-04-02 MED ORDER — TECHNETIUM TC 99M TETROFOSMIN IV KIT: 10.0000 | PACK | Freq: Once | INTRAVENOUS | Status: DC | PRN

## 2023-04-02 MED ORDER — TECHNETIUM TC 99M MEDRONATE IV KIT
20.0000 | PACK | Freq: Once | INTRAVENOUS | Status: AC | PRN
  Administered 2023-04-02: 20.3 via INTRAVENOUS

## 2023-04-02 MED ORDER — TECHNETIUM TC 99M TETROFOSMIN IV KIT: 30.0000 | PACK | Freq: Once | INTRAVENOUS | Status: DC | PRN

## 2023-04-07 ENCOUNTER — Inpatient Hospital Stay: Payer: 59 | Attending: Hematology

## 2023-04-07 ENCOUNTER — Ambulatory Visit (HOSPITAL_COMMUNITY)
Admission: RE | Admit: 2023-04-07 | Discharge: 2023-04-07 | Disposition: A | Discharge status: Home / Self Care | Payer: 59 | Source: Ambulatory Visit | Attending: Hematology | Admitting: Hematology

## 2023-04-07 DIAGNOSIS — C50919 Malignant neoplasm of unspecified site of unspecified female breast: Secondary | ICD-10-CM

## 2023-04-07 DIAGNOSIS — Z5111 Encounter for antineoplastic chemotherapy: Secondary | ICD-10-CM | POA: Insufficient documentation

## 2023-04-07 DIAGNOSIS — F17210 Nicotine dependence, cigarettes, uncomplicated: Secondary | ICD-10-CM | POA: Insufficient documentation

## 2023-04-07 DIAGNOSIS — Z79818 Long term (current) use of other agents affecting estrogen receptors and estrogen levels: Secondary | ICD-10-CM | POA: Insufficient documentation

## 2023-04-07 DIAGNOSIS — C7951 Secondary malignant neoplasm of bone: Secondary | ICD-10-CM | POA: Insufficient documentation

## 2023-04-07 DIAGNOSIS — C50912 Malignant neoplasm of unspecified site of left female breast: Secondary | ICD-10-CM | POA: Insufficient documentation

## 2023-04-07 DIAGNOSIS — Z170 Estrogen receptor positive status [ER+]: Secondary | ICD-10-CM | POA: Insufficient documentation

## 2023-04-07 LAB — COMPREHENSIVE METABOLIC PANEL
ALT: 55 U/L — ABNORMAL HIGH (ref 0–44)
AST: 36 U/L (ref 15–41)
Albumin: 4.3 g/dL (ref 3.5–5.0)
Alkaline Phosphatase: 61 U/L (ref 38–126)
Anion gap: 10 (ref 5–15)
BUN: 14 mg/dL (ref 8–23)
CO2: 22 mmol/L (ref 22–32)
Calcium: 9.7 mg/dL (ref 8.9–10.3)
Chloride: 105 mmol/L (ref 98–111)
Creatinine, Ser: 0.86 mg/dL (ref 0.44–1.00)
GFR, Estimated: >60 mL/min (ref >60)
Glucose, Bld: 156 mg/dL — ABNORMAL HIGH (ref 70–99)
Potassium: 3.8 mmol/L (ref 3.5–5.1)
Sodium: 137 mmol/L (ref 135–145)
Total Bilirubin: 0.7 mg/dL (ref 0.3–1.2)
Total Protein: 8.4 g/dL — ABNORMAL HIGH (ref 6.5–8.1)

## 2023-04-07 LAB — CBC WITH DIFFERENTIAL/PLATELET
Abs Immature Granulocytes: 0.02 10*3/uL (ref 0.00–0.07)
Basophils Absolute: 0 10*3/uL (ref 0.0–0.1)
Basophils Relative: 1 %
Eosinophils Absolute: 0 10*3/uL (ref 0.0–0.5)
Eosinophils Relative: 0 %
HCT: 41.1 % (ref 36.0–46.0)
Hemoglobin: 14.5 g/dL (ref 12.0–15.0)
Immature Granulocytes: 1 %
Lymphocytes Relative: 17 %
Lymphs Abs: 0.4 10*3/uL — ABNORMAL LOW (ref 0.7–4.0)
MCH: 36.2 pg — ABNORMAL HIGH (ref 26.0–34.0)
MCHC: 35.3 g/dL (ref 30.0–36.0)
MCV: 102.5 fL — ABNORMAL HIGH (ref 80.0–100.0)
Monocytes Absolute: 0 10*3/uL — ABNORMAL LOW (ref 0.1–1.0)
Monocytes Relative: 2 %
Neutro Abs: 1.6 10*3/uL — ABNORMAL LOW (ref 1.7–7.7)
Neutrophils Relative %: 79 %
Platelets: 342 10*3/uL (ref 150–400)
RBC: 4.01 MIL/uL (ref 3.87–5.11)
RDW: 12.7 % (ref 11.5–15.5)
WBC: 2 10*3/uL — ABNORMAL LOW (ref 4.0–10.5)
nRBC: 0 % (ref 0.0–0.2)

## 2023-04-07 LAB — MAGNESIUM: Magnesium: 2.3 mg/dL (ref 1.7–2.4)

## 2023-04-07 MED ORDER — IOHEXOL 300 MG/ML  SOLN
100.0000 mL | Freq: Once | INTRAMUSCULAR | Status: AC | PRN
  Administered 2023-04-07: 100 mL via INTRAVENOUS

## 2023-04-08 LAB — CANCER ANTIGEN 15-3: CA 15-3: 35.4 U/mL — ABNORMAL HIGH (ref 0.0–25.0)

## 2023-04-08 LAB — CANCER ANTIGEN 27.29: CA 27.29: 39 U/mL — ABNORMAL HIGH (ref 0.0–38.6)

## 2023-04-10 ENCOUNTER — Inpatient Hospital Stay: Payer: 59 | Admitting: Hematology

## 2023-04-10 ENCOUNTER — Inpatient Hospital Stay: Payer: 59

## 2023-04-13 NOTE — Progress Notes (Signed)
[facility] [address], [place] [address]    Clinic Day:  04/25/2023  Referring physician: Billie Lade, MD  Patient Care Team: Billie Lade, MD as PCP - General (Internal Medicine) [name], RN as Oncology Nurse Navigator (Oncology) [name], MD as Medical Oncologist (Medical Oncology)   ASSESSMENT & PLAN:   Assessment: 1.  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 - Started on Ibrance and Faslodex in December 2021 for metastatic disease. - PET scan on 08/10/2021 with no evidence of hypermetabolic metastatic disease.  Widespread scattered sclerotic bone metastasis in the spine and pelvis are stable and not metabolically active.  No lymphadenopathy.  Asymmetric muscular uptake in the left lower scalene muscles and in the intercostal muscles of the ventral upper left chest wall without discrete mass correlate on the CT images, favoring activity related uptake. Wendy Zamora was dose reduced to 100 mg 3 weeks on/1 week off on 10/11/2021 due to fatigue.  2.  Social/family history: - She is a retired Engineer, structural.  She recently moved back from [place]. - Younger sister had breast cancer in her 65s.  Mother died of leukemia at age 31.  Paternal cousin also had breast cancer.  3.  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  She underwent immediate  implants which were later removed on 12/10/2019 secondary to infection and necrosis. - XRT to the chest wall and regional lymph nodes, 60 Gray, from 02/29/2020 through 04/12/2020 - She was treated with adjuvant anastrozole which was started 12/09/2019 and was found to have metastatic disease in November 2021. - Previous genetic testing was reportedly negative and [place].    Plan: 1.  Metastatic breast cancer to the bones, ER/PR positive, HER2 negative: - She has some arthralgias and myalgias associated with stiffness. - Labs today: ALT elevated at 55, rest of LFTs are normal.  Creatinine is normal.  CBC shows leukopenia with ANC of 1.6.  Platelet count and hemoglobin is normal. - CA 15-3 is 35, previously 32.  CA 27-29 is 66, previously 32. - CT CAP on 04/07/2023: Unchanged bone mets in the ribs, thoracic and lumbar vertebral bodies, pelvis and right femoral neck.  No evidence of visceral metastatic disease.  Other benign findings discussed. - Continue Ibrance 100 mg 3 weeks on/1 week off along with monthly Faslodex. - Use Aleve as needed for joint pains and stiffness. - RTC 3 months for follow-up with repeat labs and tumor marker.  Will consider PET scan if tumor markers continue to increase.   2.  Peripheral neuropathy: - Constant tingling and numbness in the hands and feet is stable.  No pharmacological intervention.  3.  Bone metastatic disease: - Continue calcium and vitamin D supplements.  Calcium is normal.  Continue denosumab today and monthly.  4.  Sleeplessness: - Continue Ambien 5 mg daily as needed.   No orders of the defined types were placed in this encounter.  I,Katie Daubenspeck,acting as a Neurosurgeon for [name], MD.,have documented all relevant documentation on the behalf of [name], MD,as directed by  [name], MD while in the presence of [name], MD.   I, [name] MD, have reviewed the above documentation  for accuracy and completeness, and I agree with the above.   [name], MD   4/26/20246:53 PM  CHIEF COMPLAINT:   Diagnosis: metastatic breast cancer    Cancer Staging  No matching staging information was found for the patient.   Prior Therapy: none  Current Therapy:  Palbociclib + Fulvestrant every 4 weeks    HISTORY OF PRESENT ILLNESS:   Oncology History  Primary malignant neoplasm of breast with metastasis (HCC)  07/06/2021 Initial Diagnosis   Metastatic breast cancer (HCC)   07/18/2021 - 08/15/2022 Chemotherapy   Patient is on Treatment Plan : BREAST Palbociclib + Fulvestrant q28d     07/18/2021 -  Chemotherapy   Patient is on Treatment Plan : BREAST Palbociclib D1-21 + Fulvestrant q28d        INTERVAL HISTORY:   Wendy Zamora is a 62 y.o. female presenting to clinic today for follow up of metastatic breast cancer. She was last seen by me on 01/14/23.  Since her last visit, she underwent restaging bone scan on 04/02/23 showing: unchanged thoracolumbar and right rib metastases.  She also underwent restaging CT C/A/P on 04/07/23 showing: unchanged sclerotic osseous metastases; no evidence of new metastatic disease.  Today, she states that she is doing well overall. Her appetite level is at 50%. Her energy level is at 40%.  PAST MEDICAL HISTORY:   Past Medical History: Past Medical History:  Diagnosis Date   Allergy    Asthma    Asthma    diagnosed age 34   Bone metastasis 08/2020   was on prolia   Breast cancer (HCC)    started chemo 05/05/2019. finished 08/2019   Gangrene (HCC)    GERD (gastroesophageal reflux disease)    Hypertension    Hypertension    started bp meds 2008   Lymphedema of arm    left arm   Osteopenia    Shingles 04/20/2020   left side    Surgical History: Past Surgical History:  Procedure Laterality Date   BIOPSY  01/22/2022   Procedure: BIOPSY;  Surgeon: Marguerita Merles, Reuel Boom, MD;  Location: [facility];  Service:  Gastroenterology;;   Fidela Salisbury RELEASE Bilateral 2006   ESOPHAGOGASTRODUODENOSCOPY (EGD) WITH PROPOFOL N/A 01/22/2022   Procedure: ESOPHAGOGASTRODUODENOSCOPY (EGD) WITH PROPOFOL;  Surgeon: Dolores Frame, MD;  Location: [facility];  Service: Gastroenterology;  Laterality: N/A;  205   hysteroscopy     age 85   mastectomy Bilateral 10/22/2019   pt had necrosis after 1st surgery, had another surgery to remove implants 12/10/2019   MOUTH SURGERY     TUBAL LIGATION     TUBAL LIGATION Bilateral 1998   WISDOM TOOTH EXTRACTION     early 6's    Social History: Social History   Socioeconomic History   Marital status: Married    Spouse name: Not on file   Number of children: 2   Years of education: Not on file   Highest education level: Not on file  Occupational History   Occupation: Disability  Tobacco Use   Smoking status: Every Day    Packs/day: 0.50    Years: 40.00    Additional pack years: 0.00    Total pack years: 20.00    Types:  Cigarettes   Smokeless tobacco: Never   Tobacco comments:    Smokes half a pack a day for 40 years.   Vaping Use   Vaping Use: Never used  Substance and Sexual Activity   Alcohol use: Never   Drug use: Never   Sexual activity: Yes  Other Topics Concern   Not on file  Social History Narrative   Lives with her husband.    Social Determinants of Health   Financial Resource Strain: Low Risk  (07/03/2021)   Overall Financial Resource Strain (CARDIA)    Difficulty of Paying Living Expenses: Not hard at all  Food Insecurity: No Food Insecurity (07/03/2021)   Hunger Vital Sign    Worried About Running Out of Food in the Last Year: Never true    Ran Out of Food in the Last Year: Never true  Transportation Needs: No Transportation Needs (07/03/2021)   PRAPARE - Administrator, Civil Service (Medical): No    Lack of Transportation (Non-Medical): No  Physical Activity: Insufficiently Active (07/03/2021)   Exercise Vital Sign     Days of Exercise per Week: 5 days    Minutes of Exercise per Session: 10 min  Stress: No Stress Concern Present (07/03/2021)   [employer] of Occupational Health - Occupational Stress Questionnaire    Feeling of Stress : Only a little  Social Connections: Moderately Isolated (07/03/2021)   Social Connection and Isolation Panel [NHANES]    Frequency of Communication with Friends and Family: Three times a week    Frequency of Social Gatherings with Friends and Family: Once a week    Attends Religious Services: Never    Database administrator or Organizations: No    Attends Banker Meetings: Never    Marital Status: Married  Catering manager Violence: Not At Risk (07/03/2021)   Humiliation, Afraid, Rape, and Kick questionnaire    Fear of Current or Ex-Partner: No    Emotionally Abused: No    Physically Abused: No    Sexually Abused: No    Family History: Family History  Problem Relation Age of Onset   Leukemia Mother    Breast cancer Sister    Diabetes Maternal Uncle    Hyperlipidemia Maternal Uncle    Diabetes Mellitus II Maternal Grandmother    Heart disease Maternal Grandfather    Diabetes Maternal Grandfather    Breast cancer Cousin    Breast cancer Half-Sister    Osteoporosis Half-Sister    Lupus Half-Sister    Lupus Half-Sister    Osteoporosis Half-Sister    Cancer - Lung Neg Hx    Colon cancer Neg Hx     Current Medications:  Current Outpatient Medications:    albuterol (VENTOLIN HFA) 108 (90 Base) MCG/ACT inhaler, INHALE 2 PUFFS BY MOUTH EVERY 4 HOURS AS NEEDED FOR WHEEZING FOR SHORTNESS OF BREATH, Disp: 9 g, Rfl: 3   amLODipine (NORVASC) 5 MG tablet, Take 1 tablet (5 mg total) by mouth daily., Disp: 90 tablet, Rfl: 2   Ascorbic Acid (VITAMIN C) 1000 MG tablet, Take 1,000 mg by mouth daily., Disp: , Rfl:    aspirin 81 MG EC tablet, Take 81 mg by mouth daily., Disp: , Rfl:    butalbital-acetaminophen-caffeine (FIORICET) 50-325-40 MG tablet, Take 1  tablet by mouth every 4 (four) hours as needed for headache., Disp: 30 tablet, Rfl: 0   Calcium-Magnesium-Vitamin D (CALCIUM 1200+D3 PO), Take 1 tablet by mouth daily., Disp: , Rfl:    cetirizine-pseudoephedrine (ZYRTEC-D)  5-120 MG tablet, Take 1 tablet by mouth daily., Disp: , Rfl:    CINNAMON PO, Take 1,000 mg by mouth daily., Disp: , Rfl:    cyanocobalamin 1000 MCG tablet, Take 1 tablet (1,000 mcg total) by mouth daily. (Patient taking differently: Take 1,000 mcg by mouth every other day.), Disp: 30 tablet, Rfl: 6   cyclobenzaprine (FLEXERIL) 10 MG tablet, Take 1 tablet (10 mg total) by mouth 2 (two) times daily as needed for muscle spasms., Disp: 60 tablet, Rfl: 5   denosumab (XGEVA) 120 MG/1.7ML SOLN injection, Inject 120 mg into the skin every 30 (thirty) days., Disp: , Rfl:    eszopiclone (LUNESTA) 1 MG TABS tablet, Take 1 tablet (1 mg total) by mouth at bedtime as needed for sleep. Take immediately before bedtime, Disp: 30 tablet, Rfl: 0   fulvestrant (FASLODEX) 250 MG/5ML injection, Inject 500 mg into the muscle every 30 (thirty) days. One injection each buttock over 1-2 minutes. Warm prior to use., Disp: , Rfl:    furosemide (LASIX) 20 MG tablet, Take 1 tablet (20 mg total) by mouth daily as needed., Disp: 30 tablet, Rfl: 0   HYDROcodone-acetaminophen (NORCO) 5-325 MG tablet, Take 1 tablet by mouth every 12 (twelve) hours as needed for moderate pain., Disp: 30 tablet, Rfl: 0   lansoprazole (PREVACID) 30 MG capsule, TAKE 1 CAPSULE(30 MG) BY MOUTH IN THE MORNING AND AT BEDTIME, Disp: 120 capsule, Rfl: 0   Misc. Devices MISC, Please provide patient with left arm compression sleeve. Diagnosis metastatic breast cancer, bilateral mastectomy, left arm lymphedema, Disp: 1 each, Rfl: 0   NAPHCON-A 0.025-0.3 % ophthalmic solution, Place 2 drops into both eyes in the morning and at bedtime., Disp: , Rfl:    palbociclib (IBRANCE) 100 MG tablet, TAKE 1 TABLET BY MOUTH 1 TIME A DAY ON DAYS 1 TO 21 OF A 28  DAY CYCLE, Disp: 21 tablet, Rfl: 4   predniSONE (DELTASONE) 50 MG tablet, Take 1 tab 13hrs, 7hrs and 1hr prior to scan., Disp: 3 tablet, Rfl: 0   diphenhydrAMINE (BENADRYL) 50 MG tablet, Take 1 tablet (50 mg total) by mouth once for 1 dose. 1hr prior to scan, Disp: 1 tablet, Rfl: 0   Allergies: Allergies  Allergen Reactions   Augmentin [Amoxicillin-Pot Clavulanate] Anaphylaxis   Ciprofloxacin Anaphylaxis   Claritin [Loratadine] Anaphylaxis   Gabapentin Anaphylaxis   Keflex [Cephalexin] Anaphylaxis   Levofloxacin Anaphylaxis   Lisinopril Swelling   Lisinopril Anaphylaxis   Metoprolol Swelling   Metoprolol Anaphylaxis   Nexium [Esomeprazole] Anaphylaxis   Sulfa Antibiotics Hives and Swelling   Tetracyclines & Related Anaphylaxis   Iohexol Hives    Needs pre meds   Ultram [Tramadol] Other (See Comments)    Patient states medication irritates throat    Ceftin [Cefuroxime Axetil] Hives   Chromium Other (See Comments)    headache   Iodinated Contrast Media Hives   Misc. Sulfonamide Containing Compounds Other (See Comments)   Oxycodone Itching   Sulfa Antibiotics     Possible Reaction    Latex Rash    itching   Nickel Rash    itching    REVIEW OF SYSTEMS:   Review of Systems  Constitutional:  Negative for chills, fatigue and fever.  HENT:   Negative for lump/mass, mouth sores, nosebleeds, sore throat and trouble swallowing.   Eyes:  Negative for eye problems.  Respiratory:  Positive for cough and shortness of breath.   Cardiovascular:  Negative for chest pain, leg swelling and palpitations.  Gastrointestinal:  Positive for constipation. Negative for abdominal pain, diarrhea, nausea and vomiting.  Genitourinary:  Negative for bladder incontinence, difficulty urinating, dysuria, frequency, hematuria and nocturia.   Musculoskeletal:  Positive for arthralgias. Negative for back pain, flank pain, myalgias and neck pain.  Skin:  Negative for itching and rash.  Neurological:   Positive for headaches and numbness. Negative for dizziness.  Hematological:  Does not bruise/bleed easily.  Psychiatric/Behavioral:  Negative for depression, sleep disturbance and suicidal ideas. The patient is nervous/anxious.   All other systems reviewed and are negative.    VITALS:   Blood pressure (!) 148/78, pulse 92, temperature 97.9 F (36.6 C), temperature source Oral, resp. rate 18, height 5' 4.5" (1.638 m), weight 185 lb 3.2 oz (84 kg), last menstrual period 06/08/2012, SpO2 99 %.  Wt Readings from Last 3 Encounters:  04/14/23 185 lb 3.2 oz (84 kg)  03/04/23 184 lb 3.2 oz (83.6 kg)  01/14/23 186 lb 6.4 oz (84.6 kg)    Body mass index is 31.3 kg/m.  Performance status (ECOG): 1 - Symptomatic but completely ambulatory  PHYSICAL EXAM:   Physical Exam Vitals and nursing note reviewed. Exam conducted with a chaperone present.  Constitutional:      Appearance: Normal appearance.  Cardiovascular:     Rate and Rhythm: Normal rate and regular rhythm.     Pulses: Normal pulses.     Heart sounds: Normal heart sounds.  Pulmonary:     Effort: Pulmonary effort is normal.     Breath sounds: Normal breath sounds.  Abdominal:     Palpations: Abdomen is soft. There is no hepatomegaly, splenomegaly or mass.     Tenderness: There is no abdominal tenderness.  Musculoskeletal:     Right lower leg: No edema.     Left lower leg: No edema.  Lymphadenopathy:     Cervical: No cervical adenopathy.     Right cervical: No superficial, deep or posterior cervical adenopathy.    Left cervical: No superficial, deep or posterior cervical adenopathy.     Upper Body:     Right upper body: No supraclavicular or axillary adenopathy.     Left upper body: No supraclavicular or axillary adenopathy.  Neurological:     General: No focal deficit present.     Mental Status: She is alert and oriented to person, place, and time.  Psychiatric:        Mood and Affect: Mood normal.        Behavior:  Behavior normal.     LABS:      Latest Ref Rng & Units 04/07/2023    8:52 AM 03/18/2023   10:51 AM 01/07/2023    1:47 PM  CBC  WBC 4.0 - 10.5 K/uL 2.0  2.8  3.3   Hemoglobin 12.0 - 15.0 g/dL 16.1  09.6  04.5   Hematocrit 36.0 - 46.0 % 41.1  38.5  38.3   Platelets 150 - 400 K/uL 342  177  301       Latest Ref Rng & Units 04/07/2023    8:52 AM 03/18/2023   10:51 AM 02/11/2023   12:55 PM  CMP  Glucose 70 - 99 mg/dL 409  97  88   BUN 8 - 23 mg/dL 14  18  21    Creatinine 0.44 - 1.00 mg/dL 8.11  9.14  7.82   Sodium 135 - 145 mmol/L 137  138  139   Potassium 3.5 - 5.1 mmol/L 3.8  3.9  3.9  Chloride 98 - 111 mmol/L 105  105  105   CO2 22 - 32 mmol/L 22  25  24    Calcium 8.9 - 10.3 mg/dL 9.7  9.8  9.7   Total Protein 6.5 - 8.1 g/dL 8.4  7.6  7.6   Total Bilirubin 0.3 - 1.2 mg/dL 0.7  0.6  0.5   Alkaline Phos 38 - 126 U/L 61  58  55   AST 15 - 41 U/L 36  41  45   ALT 0 - 44 U/L 55  57  58      No results found for: "CEA1", "CEA" / No results found for: "CEA1", "CEA" No results found for: "PSA1" No results found for: "WUJ811" No results found for: "CAN125"  No results found for: "TOTALPROTELP", "ALBUMINELP", "A1GS", "A2GS", "BETS", "BETA2SER", "GAMS", "MSPIKE", "SPEI" No results found for: "TIBC", "FERRITIN", "IRONPCTSAT" No results found for: "LDH"   STUDIES:   CT CHEST ABDOMEN PELVIS W CONTRAST  Result Date: 04/07/2023 CLINICAL DATA:  Metastatic breast cancer, assess treatment response * Tracking Code: BO * EXAM: CT CHEST, ABDOMEN, AND PELVIS WITH CONTRAST TECHNIQUE: Multidetector CT imaging of the chest, abdomen and pelvis was performed following the standard protocol during bolus administration of intravenous contrast. RADIATION DOSE REDUCTION: This exam was performed according to the departmental dose-optimization program which includes automated exposure control, adjustment of the mA and/or kV according to patient size and/or use of iterative reconstruction technique. CONTRAST:   OMNIPAQUE IOHEXOL 300 MG/ML  SOLN COMPARISON:  10/04/2022 FINDINGS: CT CHEST FINDINGS Cardiovascular: Aortic atherosclerosis. Normal heart size. No pericardial effusion. Mediastinum/Nodes: No enlarged mediastinal, hilar, or axillary lymph nodes. Thyroid gland, trachea, and esophagus demonstrate no significant findings. Lungs/Pleura: Moderate centrilobular emphysema. Subpleural radiation fibrosis of the anterior left upper lobe (series 4, image 64). No pleural effusion or pneumothorax. Musculoskeletal: Status post bilateral mastectomy. No acute osseous findings. CT ABDOMEN PELVIS FINDINGS Hepatobiliary: No solid liver abnormality is seen. Hepatic steatosis. No gallstones, gallbladder wall thickening, or biliary dilatation. Pancreas: Unremarkable. No pancreatic ductal dilatation or surrounding inflammatory changes. Spleen: Normal in size without significant abnormality. Adrenals/Urinary Tract: Adrenal glands are unremarkable. Kidneys are normal, without renal calculi, solid lesion, or hydronephrosis. Bladder is unremarkable. Stomach/Bowel: Stomach is within normal limits. Appendix appears normal. No evidence of bowel wall thickening, distention, or inflammatory changes. Vascular/Lymphatic: Aortic atherosclerosis. No enlarged abdominal or pelvic lymph nodes. Reproductive: No mass or other abnormality. Other: No abdominal wall hernia or abnormality. No ascites. Musculoskeletal: No acute osseous findings. Unchanged sclerotic osseous metastases throughout the ribs, thoracic and lumbar vertebral bodies, and pelvis as well as the right femoral neck. IMPRESSION: 1. Unchanged sclerotic osseous metastases throughout the ribs, thoracic and lumbar vertebral bodies, pelvis, and right femoral neck. 2. No evidence of new metastatic disease in the chest, abdomen, or pelvis. 3. Status post bilateral mastectomy. 4. Hepatic steatosis. 5. Emphysema. Aortic Atherosclerosis (ICD10-I70.0) and Emphysema (ICD10-J43.9). Electronically  Signed   By: Jearld Lesch M.D.   On: 04/07/2023 16:40   NM Bone Scan Whole Body  Result Date: 04/04/2023 CLINICAL DATA:  Breast cancer, stage IV, assess treatment response. Diffuse joint pain, pelvic pain. EXAM: NUCLEAR MEDICINE WHOLE BODY BONE SCAN TECHNIQUE: Whole body anterior and posterior images were obtained approximately 3 hours after intravenous injection of radiopharmaceutical. RADIOPHARMACEUTICALS:  20.3 mCi Technetium-40m MDP IV COMPARISON:  CT chest abdomen pelvis 10/04/2022 and bone scan 09/16/2022. FINDINGS: Uptake in the midthoracic spine, lumbar spine and a mid posterior right rib, unchanged from 09/16/2022. IMPRESSION:  Thoracolumbar and right rib metastases, unchanged. Additional lesions are better seen on CT 10/04/2022. Electronically Signed   By: Leanna Battles M.D.   On: 04/04/2023 13:36

## 2023-04-14 ENCOUNTER — Inpatient Hospital Stay (HOSPITAL_BASED_OUTPATIENT_CLINIC_OR_DEPARTMENT_OTHER): Payer: 59 | Admitting: Hematology

## 2023-04-14 ENCOUNTER — Inpatient Hospital Stay: Payer: 59

## 2023-04-14 VITALS — BP 148/78 | HR 92 | Temp 97.9°F | Resp 18 | Ht 64.5 in | Wt 185.2 lb

## 2023-04-14 DIAGNOSIS — C50912 Malignant neoplasm of unspecified site of left female breast: Secondary | ICD-10-CM | POA: Diagnosis not present

## 2023-04-14 DIAGNOSIS — C7951 Secondary malignant neoplasm of bone: Secondary | ICD-10-CM | POA: Diagnosis not present

## 2023-04-14 DIAGNOSIS — Z5111 Encounter for antineoplastic chemotherapy: Secondary | ICD-10-CM | POA: Diagnosis not present

## 2023-04-14 DIAGNOSIS — C50919 Malignant neoplasm of unspecified site of unspecified female breast: Secondary | ICD-10-CM

## 2023-04-14 DIAGNOSIS — F17210 Nicotine dependence, cigarettes, uncomplicated: Secondary | ICD-10-CM | POA: Diagnosis not present

## 2023-04-14 DIAGNOSIS — Z79818 Long term (current) use of other agents affecting estrogen receptors and estrogen levels: Secondary | ICD-10-CM | POA: Diagnosis not present

## 2023-04-14 DIAGNOSIS — Z170 Estrogen receptor positive status [ER+]: Secondary | ICD-10-CM | POA: Diagnosis not present

## 2023-04-14 MED ORDER — DENOSUMAB 120 MG/1.7ML ~~LOC~~ SOLN
120.0000 mg | Freq: Once | SUBCUTANEOUS | Status: AC
  Administered 2023-04-14: 120 mg via SUBCUTANEOUS
  Filled 2023-04-14: qty 1.7

## 2023-04-14 MED ORDER — FULVESTRANT 250 MG/5ML IM SOSY
500.0000 mg | PREFILLED_SYRINGE | Freq: Once | INTRAMUSCULAR | Status: AC
  Administered 2023-04-14: 500 mg via INTRAMUSCULAR
  Filled 2023-04-14: qty 10

## 2023-04-14 NOTE — Patient Instructions (Addendum)
[facility] at [facility] Discharge Instructions   You were seen and examined today by Dr. Ellin Saba.  He reviewed the results of your CT scan and bone scan which show the cancer is stable. There is no new evidence of cancer noted on these exams.   He reviewed the results of your lab work which are normal/stable.   We will proceed with your injections today.   You may take ibuprofen (Motrin, Advil) or Aleve (naproxen sodium) as directed on the bottle for muscle and joint aches and pain.   Return as scheduled.    Thank you for choosing [facility] at [facility] to provide your oncology and hematology care.  To afford each patient quality time with our provider, please arrive at least 15 minutes before your scheduled appointment time.   If you have a lab appointment with the Cancer Center please come in thru the Main Entrance and check in at the main information desk.  You need to re-schedule your appointment should you arrive 10 or more minutes late.  We strive to give you quality time with our providers, and arriving late affects you and other patients whose appointments are after yours.  Also, if you no show three or more times for appointments you may be dismissed from the clinic at the providers discretion.     Again, thank you for choosing [facility].  Our hope is that these requests will decrease the amount of time that you wait before being seen by our physicians.       _____________________________________________________________  Should you have questions after your visit to [facility], please contact our office at [phone] and follow the prompts.  Our office hours are 8:00 a.m. and 4:30 p.m. Monday - Friday.  Please note that voicemails left after 4:00 p.m. may not be returned until the following business day.  We are closed weekends and major holidays.  You do have access to a nurse 24-7, just call the  main number to the clinic [phone] and do not press any options, hold on the line and a nurse will answer the phone.    For prescription refill requests, have your pharmacy contact our office and allow 72 hours.    Due to Covid, you will need to wear a mask upon entering the hospital. If you do not have a mask, a mask will be given to you at the Main Entrance upon arrival. For doctor visits, patients may have 1 support person age 36 or older with them. For treatment visits, patients can not have anyone with them due to social distancing guidelines and our immunocompromised population.

## 2023-04-14 NOTE — Progress Notes (Signed)
Xgeva and faslodex injections given per orders. Patient tolerated it well without problems. Vitals stable and discharged home from clinic ambulatory. Follow up as scheduled.

## 2023-04-25 ENCOUNTER — Encounter: Payer: Self-pay | Admitting: Hematology

## 2023-04-26 ENCOUNTER — Other Ambulatory Visit (INDEPENDENT_AMBULATORY_CARE_PROVIDER_SITE_OTHER): Payer: Self-pay | Admitting: Gastroenterology

## 2023-04-26 DIAGNOSIS — K219 Gastro-esophageal reflux disease without esophagitis: Secondary | ICD-10-CM

## 2023-04-26 DIAGNOSIS — R1310 Dysphagia, unspecified: Secondary | ICD-10-CM

## 2023-05-13 ENCOUNTER — Inpatient Hospital Stay: Payer: 59

## 2023-05-13 ENCOUNTER — Inpatient Hospital Stay: Payer: 59 | Attending: Hematology

## 2023-05-13 VITALS — BP 140/73 | HR 80 | Temp 98.4°F | Resp 20

## 2023-05-13 DIAGNOSIS — C50912 Malignant neoplasm of unspecified site of left female breast: Secondary | ICD-10-CM | POA: Diagnosis not present

## 2023-05-13 DIAGNOSIS — Z79818 Long term (current) use of other agents affecting estrogen receptors and estrogen levels: Secondary | ICD-10-CM | POA: Diagnosis not present

## 2023-05-13 DIAGNOSIS — F17210 Nicotine dependence, cigarettes, uncomplicated: Secondary | ICD-10-CM | POA: Insufficient documentation

## 2023-05-13 DIAGNOSIS — Z5111 Encounter for antineoplastic chemotherapy: Secondary | ICD-10-CM | POA: Diagnosis not present

## 2023-05-13 DIAGNOSIS — Z170 Estrogen receptor positive status [ER+]: Secondary | ICD-10-CM | POA: Diagnosis not present

## 2023-05-13 DIAGNOSIS — C50919 Malignant neoplasm of unspecified site of unspecified female breast: Secondary | ICD-10-CM

## 2023-05-13 DIAGNOSIS — C7951 Secondary malignant neoplasm of bone: Secondary | ICD-10-CM | POA: Diagnosis not present

## 2023-05-13 DIAGNOSIS — Z9012 Acquired absence of left breast and nipple: Secondary | ICD-10-CM | POA: Diagnosis not present

## 2023-05-13 LAB — COMPREHENSIVE METABOLIC PANEL
ALT: 59 U/L — ABNORMAL HIGH (ref 0–44)
AST: 43 U/L — ABNORMAL HIGH (ref 15–41)
Albumin: 4.3 g/dL (ref 3.5–5.0)
Alkaline Phosphatase: 57 U/L (ref 38–126)
Anion gap: 9 (ref 5–15)
BUN: 15 mg/dL (ref 8–23)
CO2: 25 mmol/L (ref 22–32)
Calcium: 9.8 mg/dL (ref 8.9–10.3)
Chloride: 105 mmol/L (ref 98–111)
Creatinine, Ser: 0.92 mg/dL (ref 0.44–1.00)
GFR, Estimated: >60 mL/min (ref >60)
Glucose, Bld: 98 mg/dL (ref 70–99)
Potassium: 3.5 mmol/L (ref 3.5–5.1)
Sodium: 139 mmol/L (ref 135–145)
Total Bilirubin: 1 mg/dL (ref 0.3–1.2)
Total Protein: 7.7 g/dL (ref 6.5–8.1)

## 2023-05-13 LAB — CBC WITH DIFFERENTIAL/PLATELET
Abs Immature Granulocytes: 0 10*3/uL (ref 0.00–0.07)
Basophils Absolute: 0 10*3/uL (ref 0.0–0.1)
Basophils Relative: 0 %
Eosinophils Absolute: 0.1 10*3/uL (ref 0.0–0.5)
Eosinophils Relative: 2 %
HCT: 41.1 % (ref 36.0–46.0)
Hemoglobin: 14.3 g/dL (ref 12.0–15.0)
Lymphocytes Relative: 35 %
Lymphs Abs: 0.9 10*3/uL (ref 0.7–4.0)
MCH: 36.7 pg — ABNORMAL HIGH (ref 26.0–34.0)
MCHC: 34.8 g/dL (ref 30.0–36.0)
MCV: 105.4 fL — ABNORMAL HIGH (ref 80.0–100.0)
Monocytes Absolute: 0.3 10*3/uL (ref 0.1–1.0)
Monocytes Relative: 10 %
Neutro Abs: 1.4 10*3/uL — ABNORMAL LOW (ref 1.7–7.7)
Neutrophils Relative %: 53 %
Platelets: 197 10*3/uL (ref 150–400)
RBC: 3.9 MIL/uL (ref 3.87–5.11)
RDW: 13 % (ref 11.5–15.5)
WBC: 2.7 10*3/uL — ABNORMAL LOW (ref 4.0–10.5)
nRBC: 0 % (ref 0.0–0.2)

## 2023-05-13 MED ORDER — FULVESTRANT 250 MG/5ML IM SOSY
500.0000 mg | PREFILLED_SYRINGE | Freq: Once | INTRAMUSCULAR | Status: AC
  Administered 2023-05-13: 500 mg via INTRAMUSCULAR
  Filled 2023-05-13: qty 10

## 2023-05-13 MED ORDER — DENOSUMAB 120 MG/1.7ML ~~LOC~~ SOLN
120.0000 mg | Freq: Once | SUBCUTANEOUS | Status: AC
  Administered 2023-05-13: 120 mg via SUBCUTANEOUS
  Filled 2023-05-13: qty 1.7

## 2023-05-13 NOTE — Patient Instructions (Signed)
[facility]  Discharge Instructions: Thank you for choosing [facility] to provide your oncology and hematology care.  If you have a lab appointment with the Cancer Center - please note that after April 8th, 2024, all labs will be drawn in the cancer center.  You do not have to check in or register with the main entrance as you have in the past but will complete your check-in in the cancer center.  Wear comfortable clothing and clothing appropriate for easy access to any Portacath or PICC line.   We strive to give you quality time with your provider. You may need to reschedule your appointment if you arrive late (15 or more minutes).  Arriving late affects you and other patients whose appointments are after yours.  Also, if you miss three or more appointments without notifying the office, you may be dismissed from the clinic at the provider's discretion.      For prescription refill requests, have your pharmacy contact our office and allow 72 hours for refills to be completed.    Today you received the following Faslodex and Xgeva, return as scheduled.   To help prevent nausea and vomiting after your treatment, we encourage you to take your nausea medication as directed.  BELOW ARE SYMPTOMS THAT SHOULD BE REPORTED IMMEDIATELY: *FEVER GREATER THAN 100.4 F (38 C) OR HIGHER *CHILLS OR SWEATING *NAUSEA AND VOMITING THAT IS NOT CONTROLLED WITH YOUR NAUSEA MEDICATION *UNUSUAL SHORTNESS OF BREATH *UNUSUAL BRUISING OR BLEEDING *URINARY PROBLEMS (pain or burning when urinating, or frequent urination) *BOWEL PROBLEMS (unusual diarrhea, constipation, pain near the anus) TENDERNESS IN MOUTH AND THROAT WITH OR WITHOUT PRESENCE OF ULCERS (sore throat, sores in mouth, or a toothache) UNUSUAL RASH, SWELLING OR PAIN  UNUSUAL VAGINAL DISCHARGE OR ITCHING   Items with * indicate a potential emergency and should be followed up as soon as possible or go to the Emergency  Department if any problems should occur.  Please show the CHEMOTHERAPY ALERT CARD or IMMUNOTHERAPY ALERT CARD at check-in to the Emergency Department and triage nurse.  Should you have questions after your visit or need to cancel or reschedule your appointment, please contact [facility] [phone]  and follow the prompts.  Office hours are 8:00 a.m. to 4:30 p.m. Monday - Friday. Please note that voicemails left after 4:00 p.m. may not be returned until the following business day.  We are closed weekends and major holidays. You have access to a nurse at all times for urgent questions. Please call the main number to the clinic [phone] and follow the prompts.  For any non-urgent questions, you may also contact your provider using MyChart. We now offer e-Visits for anyone 52 and older to request care online for non-urgent symptoms. For details visit [email or web address].   Also download the MyChart app! Go to the app store, search "MyChart", open the app, select , and log in with your MyChart username and password.

## 2023-05-13 NOTE — Progress Notes (Signed)
Patient taking calcium as directed. Denied tooth, jaw, and leg pain. No recent or upcoming dental visits. Labs reviewed. Patient tolerated injection with no complaints voiced. See MAR for details. Patient stable during and after injection. Site clean and dry with no bruising or swelling noted. Band aid applied.  Patient tolerated Faslodex injection with no complaints voiced. Bilateral sites clean and dry with no bruising or swelling noted. Band aids applied. See MAR for details. Patient stable during and after injections. VSS with discharge and left in satisfactory condition with no s/s of distress noted.  

## 2023-05-22 ENCOUNTER — Ambulatory Visit (INDEPENDENT_AMBULATORY_CARE_PROVIDER_SITE_OTHER): Payer: 59 | Admitting: Gastroenterology

## 2023-05-29 ENCOUNTER — Other Ambulatory Visit: Payer: Self-pay

## 2023-05-29 MED ORDER — AMLODIPINE BESYLATE 5 MG PO TABS: 5.0000 mg | ORAL_TABLET | Freq: Every day | ORAL | 2 refills | Status: DC

## 2023-06-10 ENCOUNTER — Encounter: Payer: Self-pay | Admitting: Internal Medicine

## 2023-06-10 ENCOUNTER — Inpatient Hospital Stay: Payer: 59

## 2023-06-10 ENCOUNTER — Other Ambulatory Visit: Payer: Self-pay | Admitting: *Deleted

## 2023-06-10 ENCOUNTER — Ambulatory Visit (INDEPENDENT_AMBULATORY_CARE_PROVIDER_SITE_OTHER): Payer: 59 | Admitting: Internal Medicine

## 2023-06-10 ENCOUNTER — Inpatient Hospital Stay: Payer: 59 | Attending: Hematology

## 2023-06-10 VITALS — BP 122/76 | HR 90 | Ht 64.5 in | Wt 185.2 lb

## 2023-06-10 VITALS — BP 123/57 | HR 79 | Resp 20

## 2023-06-10 DIAGNOSIS — Z5111 Encounter for antineoplastic chemotherapy: Secondary | ICD-10-CM | POA: Diagnosis not present

## 2023-06-10 DIAGNOSIS — G5601 Carpal tunnel syndrome, right upper limb: Secondary | ICD-10-CM

## 2023-06-10 DIAGNOSIS — Z7982 Long term (current) use of aspirin: Secondary | ICD-10-CM | POA: Diagnosis not present

## 2023-06-10 DIAGNOSIS — M7918 Myalgia, other site: Secondary | ICD-10-CM | POA: Diagnosis not present

## 2023-06-10 DIAGNOSIS — F17210 Nicotine dependence, cigarettes, uncomplicated: Secondary | ICD-10-CM | POA: Diagnosis not present

## 2023-06-10 DIAGNOSIS — G8929 Other chronic pain: Secondary | ICD-10-CM | POA: Diagnosis not present

## 2023-06-10 DIAGNOSIS — E782 Mixed hyperlipidemia: Secondary | ICD-10-CM

## 2023-06-10 DIAGNOSIS — E538 Deficiency of other specified B group vitamins: Secondary | ICD-10-CM | POA: Diagnosis not present

## 2023-06-10 DIAGNOSIS — C50912 Malignant neoplasm of unspecified site of left female breast: Secondary | ICD-10-CM | POA: Diagnosis not present

## 2023-06-10 DIAGNOSIS — C50919 Malignant neoplasm of unspecified site of unspecified female breast: Secondary | ICD-10-CM

## 2023-06-10 DIAGNOSIS — R42 Dizziness and giddiness: Secondary | ICD-10-CM

## 2023-06-10 DIAGNOSIS — G629 Polyneuropathy, unspecified: Secondary | ICD-10-CM | POA: Insufficient documentation

## 2023-06-10 DIAGNOSIS — I10 Essential (primary) hypertension: Secondary | ICD-10-CM

## 2023-06-10 DIAGNOSIS — Z79899 Other long term (current) drug therapy: Secondary | ICD-10-CM | POA: Diagnosis not present

## 2023-06-10 DIAGNOSIS — Z1329 Encounter for screening for other suspected endocrine disorder: Secondary | ICD-10-CM

## 2023-06-10 DIAGNOSIS — F5104 Psychophysiologic insomnia: Secondary | ICD-10-CM

## 2023-06-10 DIAGNOSIS — Z5112 Encounter for antineoplastic immunotherapy: Secondary | ICD-10-CM | POA: Diagnosis not present

## 2023-06-10 DIAGNOSIS — M810 Age-related osteoporosis without current pathological fracture: Secondary | ICD-10-CM

## 2023-06-10 DIAGNOSIS — C7951 Secondary malignant neoplasm of bone: Secondary | ICD-10-CM | POA: Diagnosis not present

## 2023-06-10 DIAGNOSIS — Z170 Estrogen receptor positive status [ER+]: Secondary | ICD-10-CM | POA: Diagnosis not present

## 2023-06-10 LAB — COMPREHENSIVE METABOLIC PANEL
ALT: 59 U/L — ABNORMAL HIGH (ref 0–44)
AST: 43 U/L — ABNORMAL HIGH (ref 15–41)
Albumin: 4.2 g/dL (ref 3.5–5.0)
Alkaline Phosphatase: 54 U/L (ref 38–126)
Anion gap: 9 (ref 5–15)
BUN: 17 mg/dL (ref 8–23)
CO2: 23 mmol/L (ref 22–32)
Calcium: 9.7 mg/dL (ref 8.9–10.3)
Chloride: 105 mmol/L (ref 98–111)
Creatinine, Ser: 0.93 mg/dL (ref 0.44–1.00)
GFR, Estimated: >60 mL/min (ref >60)
Glucose, Bld: 95 mg/dL (ref 70–99)
Potassium: 3.7 mmol/L (ref 3.5–5.1)
Sodium: 137 mmol/L (ref 135–145)
Total Bilirubin: 0.5 mg/dL (ref 0.3–1.2)
Total Protein: 7.5 g/dL (ref 6.5–8.1)

## 2023-06-10 LAB — CBC WITH DIFFERENTIAL/PLATELET
Abs Immature Granulocytes: 0 10*3/uL (ref 0.00–0.07)
Band Neutrophils: 4 %
Basophils Absolute: 0 10*3/uL (ref 0.0–0.1)
Basophils Relative: 1 %
Eosinophils Absolute: 0 10*3/uL (ref 0.0–0.5)
Eosinophils Relative: 1 %
HCT: 38.3 % (ref 36.0–46.0)
Hemoglobin: 13.5 g/dL (ref 12.0–15.0)
Lymphocytes Relative: 40 %
Lymphs Abs: 1.2 10*3/uL (ref 0.7–4.0)
MCH: 36.1 pg — ABNORMAL HIGH (ref 26.0–34.0)
MCHC: 35.2 g/dL (ref 30.0–36.0)
MCV: 102.4 fL — ABNORMAL HIGH (ref 80.0–100.0)
Monocytes Absolute: 0.1 10*3/uL (ref 0.1–1.0)
Monocytes Relative: 3 %
Neutro Abs: 1.7 10*3/uL (ref 1.7–7.7)
Neutrophils Relative %: 51 %
Platelets: 225 10*3/uL (ref 150–400)
RBC: 3.74 MIL/uL — ABNORMAL LOW (ref 3.87–5.11)
RDW: 12.9 % (ref 11.5–15.5)
WBC: 3 10*3/uL — ABNORMAL LOW (ref 4.0–10.5)
nRBC: 0 % (ref 0.0–0.2)

## 2023-06-10 LAB — FOLATE: Folate: 16.8 ng/mL (ref >5.9)

## 2023-06-10 LAB — LIPID PANEL
Cholesterol: 225 mg/dL — ABNORMAL HIGH (ref 0–200)
HDL: 53 mg/dL (ref >40)
LDL Cholesterol: 130 mg/dL — ABNORMAL HIGH (ref 0–99)
Total CHOL/HDL Ratio: 4.2 RATIO
Triglycerides: 212 mg/dL — ABNORMAL HIGH (ref <150)
VLDL: 42 mg/dL — ABNORMAL HIGH (ref 0–40)

## 2023-06-10 LAB — HEMOGLOBIN A1C
Hgb A1c MFr Bld: 5.3 % (ref 4.8–5.6)
Mean Plasma Glucose: 105.41 mg/dL

## 2023-06-10 LAB — TSH: TSH: 1.584 u[IU]/mL (ref 0.350–4.500)

## 2023-06-10 LAB — T4, FREE: Free T4: 0.63 ng/dL (ref 0.61–1.12)

## 2023-06-10 LAB — VITAMIN D 25 HYDROXY (VIT D DEFICIENCY, FRACTURES): Vit D, 25-Hydroxy: 29.88 ng/mL — ABNORMAL LOW (ref 30–100)

## 2023-06-10 LAB — VITAMIN B12: Vitamin B-12: 657 pg/mL (ref 180–914)

## 2023-06-10 MED ORDER — FULVESTRANT 250 MG/5ML IM SOSY
500.0000 mg | PREFILLED_SYRINGE | Freq: Once | INTRAMUSCULAR | Status: AC
  Administered 2023-06-10: 500 mg via INTRAMUSCULAR
  Filled 2023-06-10: qty 10

## 2023-06-10 MED ORDER — DENOSUMAB 120 MG/1.7ML ~~LOC~~ SOLN
120.0000 mg | Freq: Once | SUBCUTANEOUS | Status: AC
  Administered 2023-06-10: 120 mg via SUBCUTANEOUS
  Filled 2023-06-10: qty 1.7

## 2023-06-10 NOTE — Patient Instructions (Signed)
[facility] [place]  Discharge Instructions: Thank you for choosing [facility] to provide your oncology and hematology care.  If you have a lab appointment with the Cancer Center - please note that after April 8th, 2024, all labs will be drawn in the cancer center.  You do not have to check in or register with the main entrance as you have in the past but will complete your check-in in the cancer center.  Wear comfortable clothing and clothing appropriate for easy access to any Portacath or PICC line.   We strive to give you quality time with your provider. You may need to reschedule your appointment if you arrive late (15 or more minutes).  Arriving late affects you and other patients whose appointments are after yours.  Also, if you miss three or more appointments without notifying the office, you may be dismissed from the clinic at the provider's discretion.      For prescription refill requests, have your pharmacy contact our office and allow 72 hours for refills to be completed.  To help prevent nausea and vomiting after your treatment, we encourage you to take your nausea medication as directed.  BELOW ARE SYMPTOMS THAT SHOULD BE REPORTED IMMEDIATELY: *FEVER GREATER THAN 100.4 F (38 C) OR HIGHER *CHILLS OR SWEATING *NAUSEA AND VOMITING THAT IS NOT CONTROLLED WITH YOUR NAUSEA MEDICATION *UNUSUAL SHORTNESS OF BREATH *UNUSUAL BRUISING OR BLEEDING *URINARY PROBLEMS (pain or burning when urinating, or frequent urination) *BOWEL PROBLEMS (unusual diarrhea, constipation, pain near the anus) TENDERNESS IN MOUTH AND THROAT WITH OR WITHOUT PRESENCE OF ULCERS (sore throat, sores in mouth, or a toothache) UNUSUAL RASH, SWELLING OR PAIN  UNUSUAL VAGINAL DISCHARGE OR ITCHING   Items with * indicate a potential emergency and should be followed up as soon as possible or go to the Emergency Department if any problems should occur.  Please show the CHEMOTHERAPY ALERT CARD or  IMMUNOTHERAPY ALERT CARD at check-in to the Emergency Department and triage nurse.  Should you have questions after your visit or need to cancel or reschedule your appointment, please contact [facility] [place] [phone]  and follow the prompts.  Office hours are 8:00 a.m. to 4:30 p.m. Monday - Friday. Please note that voicemails left after 4:00 p.m. may not be returned until the following business day.  We are closed weekends and major holidays. You have access to a nurse at all times for urgent questions. Please call the main number to the clinic [phone] and follow the prompts.  For any non-urgent questions, you may also contact your provider using MyChart. We now offer e-Visits for anyone 18 and older to request care online for non-urgent symptoms. For details visit [email or web address].   Also download the MyChart app! Go to the app store, search "MyChart", open the app, select [place], and log in with your MyChart username and password.   

## 2023-06-10 NOTE — Progress Notes (Signed)
Patient tolerated Faslodex injection with no complaints voiced.  Bilateral sites clean and dry with no bruising or swelling noted.  Band aids applied. See MAR for details.  Patient stable during and after injections.  VSS with discharge and left in satisfactory condition with no s/s of distress noted.     Patient taking calcium as directed.  Denied tooth, jaw, and leg pain.  No recent or upcoming dental visits.  Labs reviewed.  Patient tolerated injection with no complaints voiced.  See MAR for details.  Patient stable during and after injection.  Site clean and dry with no bruising or swelling noted.  Band aid applied.  Vss with discharge and left in satisfactory condition with no s/s of distress noted.   

## 2023-06-10 NOTE — Progress Notes (Signed)
Established Patient Office Visit  Subjective   Patient ID: Wendy Zamora, female    DOB: 1961/04/26  Age: 62 y.o. MRN: [id number]  Chief Complaint  Patient presents with   Hypertension    Follow up   Wendy Zamora returns to care today for routine follow-up.  She was last evaluated by me on 3/5.  No medication changes were made at that time and 71-month follow-up was arranged.  In the interim she has been seen by oncology.  There have otherwise been no acute interval events.  Wendy Zamora reports feeling fairly well today.  She endorses right wrist pain in the setting of carpal tunnel syndrome.  She has been wearing a wrist brace periodically.  She continues to endorse insomnia.  She has tried multiple medications previously without sustained success.  She lastly endorses chronic musculoskeletal pain.  Wendy Zamora has no acute concerns to discuss today.  Past Medical History:  Diagnosis Date   Allergy    Asthma    Asthma    diagnosed age 75   Bone metastasis 08/2020   was on prolia   Breast cancer (HCC)    started chemo 05/05/2019. finished 08/2019   Gangrene (HCC)    GERD (gastroesophageal reflux disease)    Hx of bilateral mastectomy 10/22/2019   Hypertension    Hypertension    started bp meds 2008   Lymphedema of arm    left arm   Osteopenia    Shingles 04/20/2020   left side   Past Surgical History:  Procedure Laterality Date   BIOPSY  01/22/2022   Procedure: BIOPSY;  Surgeon: Marguerita Merles, Reuel Boom, MD;  Location: [facility];  Service: Gastroenterology;;   Fidela Salisbury RELEASE Bilateral 2006   ESOPHAGOGASTRODUODENOSCOPY (EGD) WITH PROPOFOL N/A 01/22/2022   Procedure: ESOPHAGOGASTRODUODENOSCOPY (EGD) WITH PROPOFOL;  Surgeon: Dolores Frame, MD;  Location: [facility];  Service: Gastroenterology;  Laterality: N/A;  205   hysteroscopy     age 8   mastectomy Bilateral 10/22/2019   pt had necrosis after 1st surgery, had another surgery to remove implants  12/10/2019   MOUTH SURGERY     TUBAL LIGATION     TUBAL LIGATION Bilateral 1998   WISDOM TOOTH EXTRACTION     early 19's   Social History   Tobacco Use   Smoking status: Every Day    Packs/day: 0.50    Years: 40.00    Additional pack years: 0.00    Total pack years: 20.00    Types: Cigarettes   Smokeless tobacco: Never   Tobacco comments:    Smokes half a pack a day for 40 years.   Vaping Use   Vaping Use: Never used  Substance Use Topics   Alcohol use: Never   Drug use: Never   Family History  Problem Relation Age of Onset   Leukemia Mother    Breast cancer Sister    Diabetes Maternal Uncle    Hyperlipidemia Maternal Uncle    Diabetes Mellitus II Maternal Grandmother    Heart disease Maternal Grandfather    Diabetes Maternal Grandfather    Breast cancer Cousin    Breast cancer Half-Sister    Osteoporosis Half-Sister    Lupus Half-Sister    Lupus Half-Sister    Osteoporosis Half-Sister    Cancer - Lung Neg Hx    Colon cancer Neg Hx    Allergies  Allergen Reactions   Augmentin [Amoxicillin-Pot Clavulanate] Anaphylaxis   Ciprofloxacin Anaphylaxis   Claritin [Loratadine]  Anaphylaxis   Gabapentin Anaphylaxis   Keflex [Cephalexin] Anaphylaxis   Levofloxacin Anaphylaxis   Lisinopril Swelling   Lisinopril Anaphylaxis   Metoprolol Swelling   Metoprolol Anaphylaxis   Nexium [Esomeprazole] Anaphylaxis   Sulfa Antibiotics Hives and Swelling   Tetracyclines & Related Anaphylaxis   Iohexol Hives    Needs pre meds   Ultram [Tramadol] Other (See Comments)    Patient states medication irritates throat    Ceftin [Cefuroxime Axetil] Hives   Chromium Other (See Comments)    headache   Iodinated Contrast Media Hives   Misc. Sulfonamide Containing Compounds Other (See Comments)   Oxycodone Itching   Sulfa Antibiotics     Possible Reaction    Latex Rash    itching   Nickel Rash    itching   Review of Systems  Musculoskeletal:  Positive for back pain, joint pain  (Right wrist) and neck pain.  Psychiatric/Behavioral:  The patient has insomnia.   All other systems reviewed and are negative.    Objective:     BP 122/76   Pulse 90   Ht 5' 4.5" (1.638 m)   Wt 185 lb 3.2 oz (84 kg)   LMP 06/08/2012   SpO2 98%   BMI 31.30 kg/m  BP Readings from Last 3 Encounters:  06/10/23 (!) 123/57  06/10/23 122/76  05/13/23 (!) 140/73   Physical Exam Vitals reviewed.  Constitutional:      General: She is not in acute distress.    Appearance: Normal appearance. She is obese. She is not toxic-appearing.  HENT:     Head: Normocephalic and atraumatic.     Right Ear: External ear normal.     Left Ear: External ear normal.     Nose: Nose normal. No congestion or rhinorrhea.     Mouth/Throat:     Mouth: Mucous membranes are moist.     Pharynx: Oropharynx is clear. No oropharyngeal exudate or posterior oropharyngeal erythema.  Eyes:     General: No scleral icterus.    Extraocular Movements: Extraocular movements intact.     Conjunctiva/sclera: Conjunctivae normal.     Pupils: Pupils are equal, round, and reactive to light.  Cardiovascular:     Rate and Rhythm: Normal rate and regular rhythm.     Pulses: Normal pulses.     Heart sounds: Normal heart sounds. No murmur heard.    No friction rub. No gallop.  Pulmonary:     Effort: Pulmonary effort is normal.     Breath sounds: Normal breath sounds. No wheezing, rhonchi or rales.  Abdominal:     General: Abdomen is flat. Bowel sounds are normal. There is no distension.     Palpations: Abdomen is soft.     Tenderness: There is no abdominal tenderness.  Musculoskeletal:        General: No swelling. Normal range of motion.     Cervical back: Normal range of motion.     Right lower leg: No edema.     Left lower leg: No edema.     Comments: Compression sleeve on left arm  Lymphadenopathy:     Cervical: No cervical adenopathy.  Skin:    General: Skin is warm and dry.     Capillary Refill: Capillary  refill takes less than 2 seconds.     Coloration: Skin is not jaundiced.  Neurological:     General: No focal deficit present.     Mental Status: She is alert and oriented to person, place, and time.  Psychiatric:        Mood and Affect: Mood normal.        Behavior: Behavior normal.   Last CBC Lab Results  Component Value Date   WBC 3.0 (L) 06/10/2023   HGB 13.5 06/10/2023   HCT 38.3 06/10/2023   MCV 102.4 (H) 06/10/2023   MCH 36.1 (H) 06/10/2023   RDW 12.9 06/10/2023   PLT 225 06/10/2023   Last metabolic panel Lab Results  Component Value Date   GLUCOSE 95 06/10/2023   NA 137 06/10/2023   K 3.7 06/10/2023   CL 105 06/10/2023   CO2 23 06/10/2023   BUN 17 06/10/2023   CREATININE 0.93 06/10/2023   GFRNONAA >60 06/10/2023   CALCIUM 9.7 06/10/2023   PROT 7.5 06/10/2023   ALBUMIN 4.2 06/10/2023   BILITOT 0.5 06/10/2023   ALKPHOS 54 06/10/2023   AST 43 (H) 06/10/2023   ALT 59 (H) 06/10/2023   ANIONGAP 9 06/10/2023   Last lipids Lab Results  Component Value Date   CHOL 225 (H) 06/10/2023   HDL 53 06/10/2023   LDLCALC 130 (H) 06/10/2023   TRIG 212 (H) 06/10/2023   CHOLHDL 4.2 06/10/2023   Last hemoglobin A1c Lab Results  Component Value Date   HGBA1C 5.3 06/10/2023   Last thyroid functions Lab Results  Component Value Date   TSH 1.584 06/10/2023   Last vitamin D Lab Results  Component Value Date   VD25OH 29.88 (L) 06/10/2023   Last vitamin B12 and Folate Lab Results  Component Value Date   VITAMINB12 657 06/10/2023   FOLATE 16.8 06/10/2023   The 10-year ASCVD risk score (Arnett DK, et al., 2019) is: 10.2%    Assessment & Plan:   Problem List Items Addressed This Visit       Carpal tunnel syndrome of right wrist    History of bilateral carpal tunnel release in 2006.  She endorses pain in the right wrist similar to what she has previously experienced -Recommend as needed use of a wrist wrist brace for symptom relief.  Patient was also offered  referral to orthopedic surgery for further evaluation, but has declined for now.  She will notify us when she would like to move forward with this referral.      Insomnia    She continues to endorse insomnia.  She has tried multiple medications previously, most recently [place].  She endorses difficulty both falling and staying asleep.  She is not interested in trying any additional medications currently.  For now, she would like to focus on sleep hygiene. -Sleep hygiene measures reviewed again today      Chronic musculoskeletal pain    She continues to endorse diffuse, chronic musculoskeletal pain.  Cymbalta previously offered but declined as she is concerned that she may have had adverse reactions previously.  Tylenol is effective in alleviating her pain. -Recommend as needed use of Tylenol for pain relief      Return in about 3 months (around 09/10/2023).   Billie Lade, MD

## 2023-06-10 NOTE — Patient Instructions (Signed)
It was a pleasure to see you today.  Thank you for giving [place] the opportunity to be involved in your care.  Below is a brief recap of your visit and next steps.  We will plan to see you again in 3 months.  Summary No medication changes today. I recommend using tylenol for pain relief Please let us know when you would like to be referred to orthopedic surgery for your wrist Follow up in 3 months

## 2023-06-11 ENCOUNTER — Other Ambulatory Visit: Payer: Self-pay

## 2023-06-13 ENCOUNTER — Other Ambulatory Visit: Payer: Self-pay | Admitting: Hematology

## 2023-06-16 ENCOUNTER — Encounter: Payer: Self-pay | Admitting: Internal Medicine

## 2023-06-16 DIAGNOSIS — G5601 Carpal tunnel syndrome, right upper limb: Secondary | ICD-10-CM | POA: Insufficient documentation

## 2023-06-16 NOTE — Assessment & Plan Note (Signed)
She continues to endorse diffuse, chronic musculoskeletal pain.  Cymbalta previously offered but declined as she is concerned that she may have had adverse reactions previously.  Tylenol is effective in alleviating her pain. -Recommend as needed use of Tylenol for pain relief

## 2023-06-16 NOTE — Assessment & Plan Note (Signed)
History of bilateral carpal tunnel release in 2006.  She endorses pain in the right wrist similar to what she has previously experienced -Recommend as needed use of a wrist wrist brace for symptom relief.  Patient was also offered referral to orthopedic surgery for further evaluation, but has declined for now.  She will notify us when she would like to move forward with this referral.

## 2023-06-16 NOTE — Assessment & Plan Note (Signed)
She continues to endorse insomnia.  She has tried multiple medications previously, most recently [place].  She endorses difficulty both falling and staying asleep.  She is not interested in trying any additional medications currently.  For now, she would like to focus on sleep hygiene. -Sleep hygiene measures reviewed again today

## 2023-06-24 ENCOUNTER — Other Ambulatory Visit: Payer: Self-pay

## 2023-06-30 ENCOUNTER — Other Ambulatory Visit: Payer: Self-pay

## 2023-06-30 DIAGNOSIS — C50919 Malignant neoplasm of unspecified site of unspecified female breast: Secondary | ICD-10-CM

## 2023-07-01 ENCOUNTER — Inpatient Hospital Stay: Payer: 59 | Attending: Hematology

## 2023-07-01 DIAGNOSIS — Z79899 Other long term (current) drug therapy: Secondary | ICD-10-CM | POA: Diagnosis not present

## 2023-07-01 DIAGNOSIS — R7401 Elevation of levels of liver transaminase levels: Secondary | ICD-10-CM | POA: Insufficient documentation

## 2023-07-01 DIAGNOSIS — C50912 Malignant neoplasm of unspecified site of left female breast: Secondary | ICD-10-CM | POA: Diagnosis not present

## 2023-07-01 DIAGNOSIS — Z79818 Long term (current) use of other agents affecting estrogen receptors and estrogen levels: Secondary | ICD-10-CM | POA: Diagnosis not present

## 2023-07-01 DIAGNOSIS — M8580 Other specified disorders of bone density and structure, unspecified site: Secondary | ICD-10-CM | POA: Diagnosis not present

## 2023-07-01 DIAGNOSIS — D72819 Decreased white blood cell count, unspecified: Secondary | ICD-10-CM | POA: Diagnosis not present

## 2023-07-01 DIAGNOSIS — Z806 Family history of leukemia: Secondary | ICD-10-CM | POA: Insufficient documentation

## 2023-07-01 DIAGNOSIS — Z801 Family history of malignant neoplasm of trachea, bronchus and lung: Secondary | ICD-10-CM | POA: Insufficient documentation

## 2023-07-01 DIAGNOSIS — Z5111 Encounter for antineoplastic chemotherapy: Secondary | ICD-10-CM | POA: Insufficient documentation

## 2023-07-01 DIAGNOSIS — I10 Essential (primary) hypertension: Secondary | ICD-10-CM | POA: Insufficient documentation

## 2023-07-01 DIAGNOSIS — Z170 Estrogen receptor positive status [ER+]: Secondary | ICD-10-CM | POA: Diagnosis not present

## 2023-07-01 DIAGNOSIS — C7951 Secondary malignant neoplasm of bone: Secondary | ICD-10-CM | POA: Insufficient documentation

## 2023-07-01 DIAGNOSIS — Z7982 Long term (current) use of aspirin: Secondary | ICD-10-CM | POA: Insufficient documentation

## 2023-07-01 DIAGNOSIS — Z9013 Acquired absence of bilateral breasts and nipples: Secondary | ICD-10-CM | POA: Diagnosis not present

## 2023-07-01 DIAGNOSIS — C50919 Malignant neoplasm of unspecified site of unspecified female breast: Secondary | ICD-10-CM

## 2023-07-01 DIAGNOSIS — Z803 Family history of malignant neoplasm of breast: Secondary | ICD-10-CM | POA: Insufficient documentation

## 2023-07-01 DIAGNOSIS — G4700 Insomnia, unspecified: Secondary | ICD-10-CM | POA: Insufficient documentation

## 2023-07-01 DIAGNOSIS — Z9221 Personal history of antineoplastic chemotherapy: Secondary | ICD-10-CM | POA: Diagnosis not present

## 2023-07-01 DIAGNOSIS — G629 Polyneuropathy, unspecified: Secondary | ICD-10-CM | POA: Diagnosis not present

## 2023-07-01 DIAGNOSIS — F17210 Nicotine dependence, cigarettes, uncomplicated: Secondary | ICD-10-CM | POA: Diagnosis not present

## 2023-07-01 LAB — CBC WITH DIFFERENTIAL/PLATELET
Abs Immature Granulocytes: 0 10*3/uL (ref 0.00–0.07)
Band Neutrophils: 1 %
Basophils Absolute: 0.1 10*3/uL (ref 0.0–0.1)
Basophils Relative: 2 %
Eosinophils Absolute: 0.1 10*3/uL (ref 0.0–0.5)
Eosinophils Relative: 2 %
HCT: 41.7 % (ref 36.0–46.0)
Hemoglobin: 14.3 g/dL (ref 12.0–15.0)
Lymphocytes Relative: 45 %
Lymphs Abs: 1.4 10*3/uL (ref 0.7–4.0)
MCH: 35.8 pg — ABNORMAL HIGH (ref 26.0–34.0)
MCHC: 34.3 g/dL (ref 30.0–36.0)
MCV: 104.5 fL — ABNORMAL HIGH (ref 80.0–100.0)
Monocytes Absolute: 0.2 10*3/uL (ref 0.1–1.0)
Monocytes Relative: 7 %
Neutro Abs: 1.3 10*3/uL — ABNORMAL LOW (ref 1.7–7.7)
Neutrophils Relative %: 43 %
Platelets: 323 10*3/uL (ref 150–400)
RBC: 3.99 MIL/uL (ref 3.87–5.11)
RDW: 12.9 % (ref 11.5–15.5)
WBC: 3 10*3/uL — ABNORMAL LOW (ref 4.0–10.5)
nRBC: 0 % (ref 0.0–0.2)

## 2023-07-01 LAB — COMPREHENSIVE METABOLIC PANEL
ALT: 75 U/L — ABNORMAL HIGH (ref 0–44)
AST: 55 U/L — ABNORMAL HIGH (ref 15–41)
Albumin: 4.2 g/dL (ref 3.5–5.0)
Alkaline Phosphatase: 57 U/L (ref 38–126)
Anion gap: 10 (ref 5–15)
BUN: 17 mg/dL (ref 8–23)
CO2: 23 mmol/L (ref 22–32)
Calcium: 10.1 mg/dL (ref 8.9–10.3)
Chloride: 106 mmol/L (ref 98–111)
Creatinine, Ser: 0.93 mg/dL (ref 0.44–1.00)
GFR, Estimated: >60 mL/min (ref >60)
Glucose, Bld: 92 mg/dL (ref 70–99)
Potassium: 3.9 mmol/L (ref 3.5–5.1)
Sodium: 139 mmol/L (ref 135–145)
Total Bilirubin: 0.9 mg/dL (ref 0.3–1.2)
Total Protein: 7.9 g/dL (ref 6.5–8.1)

## 2023-07-01 LAB — MAGNESIUM: Magnesium: 2 mg/dL (ref 1.7–2.4)

## 2023-07-02 LAB — CANCER ANTIGEN 15-3: CA 15-3: 37.8 U/mL — ABNORMAL HIGH (ref 0.0–25.0)

## 2023-07-02 LAB — CANCER ANTIGEN 27.29: CA 27.29: 42.7 U/mL — ABNORMAL HIGH (ref 0.0–38.6)

## 2023-07-08 ENCOUNTER — Inpatient Hospital Stay: Payer: 59

## 2023-07-08 ENCOUNTER — Inpatient Hospital Stay (HOSPITAL_BASED_OUTPATIENT_CLINIC_OR_DEPARTMENT_OTHER): Payer: 59 | Admitting: Hematology

## 2023-07-08 VITALS — BP 141/73 | HR 94 | Temp 98.5°F | Resp 20 | Wt 187.9 lb

## 2023-07-08 DIAGNOSIS — F17210 Nicotine dependence, cigarettes, uncomplicated: Secondary | ICD-10-CM | POA: Diagnosis not present

## 2023-07-08 DIAGNOSIS — C50919 Malignant neoplasm of unspecified site of unspecified female breast: Secondary | ICD-10-CM

## 2023-07-08 DIAGNOSIS — I10 Essential (primary) hypertension: Secondary | ICD-10-CM | POA: Diagnosis not present

## 2023-07-08 DIAGNOSIS — G629 Polyneuropathy, unspecified: Secondary | ICD-10-CM | POA: Diagnosis not present

## 2023-07-08 DIAGNOSIS — Z9221 Personal history of antineoplastic chemotherapy: Secondary | ICD-10-CM | POA: Diagnosis not present

## 2023-07-08 DIAGNOSIS — Z803 Family history of malignant neoplasm of breast: Secondary | ICD-10-CM | POA: Diagnosis not present

## 2023-07-08 DIAGNOSIS — D72819 Decreased white blood cell count, unspecified: Secondary | ICD-10-CM | POA: Diagnosis not present

## 2023-07-08 DIAGNOSIS — Z79899 Other long term (current) drug therapy: Secondary | ICD-10-CM | POA: Diagnosis not present

## 2023-07-08 DIAGNOSIS — R7401 Elevation of levels of liver transaminase levels: Secondary | ICD-10-CM | POA: Diagnosis not present

## 2023-07-08 DIAGNOSIS — Z9013 Acquired absence of bilateral breasts and nipples: Secondary | ICD-10-CM | POA: Diagnosis not present

## 2023-07-08 DIAGNOSIS — G4700 Insomnia, unspecified: Secondary | ICD-10-CM | POA: Diagnosis not present

## 2023-07-08 DIAGNOSIS — M8580 Other specified disorders of bone density and structure, unspecified site: Secondary | ICD-10-CM | POA: Diagnosis not present

## 2023-07-08 DIAGNOSIS — Z170 Estrogen receptor positive status [ER+]: Secondary | ICD-10-CM | POA: Diagnosis not present

## 2023-07-08 DIAGNOSIS — Z801 Family history of malignant neoplasm of trachea, bronchus and lung: Secondary | ICD-10-CM | POA: Diagnosis not present

## 2023-07-08 DIAGNOSIS — Z806 Family history of leukemia: Secondary | ICD-10-CM | POA: Diagnosis not present

## 2023-07-08 DIAGNOSIS — Z79818 Long term (current) use of other agents affecting estrogen receptors and estrogen levels: Secondary | ICD-10-CM | POA: Diagnosis not present

## 2023-07-08 DIAGNOSIS — Z7982 Long term (current) use of aspirin: Secondary | ICD-10-CM | POA: Diagnosis not present

## 2023-07-08 DIAGNOSIS — C7951 Secondary malignant neoplasm of bone: Secondary | ICD-10-CM | POA: Diagnosis not present

## 2023-07-08 DIAGNOSIS — Z5111 Encounter for antineoplastic chemotherapy: Secondary | ICD-10-CM | POA: Diagnosis not present

## 2023-07-08 DIAGNOSIS — C50912 Malignant neoplasm of unspecified site of left female breast: Secondary | ICD-10-CM | POA: Diagnosis not present

## 2023-07-08 MED ORDER — FULVESTRANT 250 MG/5ML IM SOSY
500.0000 mg | PREFILLED_SYRINGE | Freq: Once | INTRAMUSCULAR | Status: AC
  Administered 2023-07-08: 500 mg via INTRAMUSCULAR
  Filled 2023-07-08: qty 10

## 2023-07-08 MED ORDER — DENOSUMAB 120 MG/1.7ML ~~LOC~~ SOLN
120.0000 mg | Freq: Once | SUBCUTANEOUS | Status: AC
  Administered 2023-07-08: 120 mg via SUBCUTANEOUS
  Filled 2023-07-08: qty 1.7

## 2023-07-08 NOTE — Progress Notes (Signed)
[name] [address] [place] [address]    Clinic Day:  07/08/2023  Referring physician: Billie Lade, MD  Patient Care Team: Billie Lade, MD as PCP - General (Internal Medicine) [name], RN as Oncology Nurse Navigator (Oncology) [name], MD as Medical Oncologist (Medical Oncology)   ASSESSMENT & PLAN:   Assessment: 1.  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 - Started on Ibrance and Faslodex in December 2021 for metastatic disease. - PET scan on 08/10/2021 with no evidence of hypermetabolic metastatic disease.  Widespread scattered sclerotic bone metastasis in the spine and pelvis are stable and not metabolically active.  No lymphadenopathy.  Asymmetric muscular uptake in the left lower scalene muscles and in the intercostal muscles of the ventral upper left chest wall without discrete mass correlate on the CT images, favoring activity related uptake. Wendy Zamora was dose reduced to 100 mg 3 weeks on/1 week off on 10/11/2021 due to fatigue.  2.  Social/family history: - She is a retired Engineer, structural.  She recently moved back from [place]. - Younger sister had breast cancer in her 67s.  Mother died of leukemia at age 80.  Paternal cousin also had breast cancer.  3.  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  She underwent immediate  implants which were later removed on 12/10/2019 secondary to infection and necrosis. - XRT to the chest wall and regional lymph nodes, 60 Gray, from 02/29/2020 through 04/12/2020 - She was treated with adjuvant anastrozole which was started 12/09/2019 and was found to have metastatic disease in November 2021. - Previous genetic testing was reportedly negative and [place].    Plan: 1.  Metastatic breast cancer to the bones, ER/PR positive, HER2 negative: - CT CAP (04/07/2023): Unchanged bone mets in the ribs, thoracic and lumbar vertebral bodies, pelvis and right femoral neck.  No evidence of visceral metastatic disease. - She complains of bilateral hip tenderness.  Stiffness of joints is stable. - She is tolerating Ibrance and Faslodex reasonably well. - Continue Aleve as needed for her joint pains and stiffness. - Reviewed labs from 07/01/2023: Elevated AST and ALT slightly worse from last time.  CBC shows mild leukopenia from [place]. - We have discussed the tumor marker CA 15-3 and CA 27-29 which have been increasing slowly but steadily over the last 3 times.  CT scan done in April did not show any worsening metastatic disease or new findings.  Hence I have recommended PET scan. - If the PET scan shows progression, we will need to switch therapy. - In the interim continue Ibrance 100 mg 3 weeks on/1 week off and monthly Faslodex. - I have also recommended Guardant360 testing for ESR 1 and other mutations.  2.  Peripheral neuropathy: - Constant tingling and numbness in the hands and feet is stable.  She is not on any medication.  3.  Bone metastatic disease: - Continue calcium and vitamin D supplements.  Calcium  is 10.1 today.  Continue denosumab today and monthly.  4.  Sleeplessness: - She is no longer taking Ambien.   Orders Placed This Encounter  Procedures   NM PET Image Restag (PS) Skull Base To Thigh    Standing Status:   Future    Standing Expiration Date:   07/07/2024    Order  Specific Question:   If indicated for the ordered procedure, I authorize the administration of a radiopharmaceutical per Radiology protocol    Answer:   Yes    Order Specific Question:   Preferred imaging location?    Answer:   [facility]     I,Wendy Zamora,acting as a Neurosurgeon for [name], MD.,have documented all relevant documentation on the behalf of [name], MD,as directed by  [name], MD while in the presence of [name], MD.  I, [name] MD, have reviewed the above documentation for accuracy and completeness, and I agree with the above.    [name], MD   7/9/202412:53 PM  CHIEF COMPLAINT:   Diagnosis: metastatic breast cancer    Cancer Staging  No matching staging information was found for the patient.   Prior Therapy: none  Current Therapy:  Palbociclib + Fulvestrant every 4 weeks    HISTORY OF PRESENT ILLNESS:   Oncology History  Primary malignant neoplasm of breast with metastasis (HCC)  07/06/2021 Initial Diagnosis   Metastatic breast cancer (HCC)   07/18/2021 - 08/15/2022 Chemotherapy   Patient is on Treatment Plan : BREAST Palbociclib + Fulvestrant q28d     07/18/2021 -  Chemotherapy   Patient is on Treatment Plan : BREAST Palbociclib D1-21 + Fulvestrant q28d        INTERVAL HISTORY:   Wendy Zamora is a 62 y.o. female presenting to clinic today for follow up of metastatic breast cancer. She was last seen by me on 04/14/23.  Today, she states that she is doing well overall. Her appetite level is at 70%. Her energy level is at 40%.  PAST MEDICAL HISTORY:   Past Medical History: Past Medical History:  Diagnosis Date   Allergy    Asthma    Asthma    diagnosed age 89   Bone metastasis 08/2020   was on prolia   Breast cancer (HCC)    started chemo 05/05/2019. finished 08/2019   Gangrene (HCC)    GERD (gastroesophageal reflux disease)    Hx of bilateral mastectomy 10/22/2019   Hypertension     Hypertension    started bp meds 2008   Lymphedema of arm    left arm   Osteopenia    Shingles 04/20/2020   left side    Surgical History: Past Surgical History:  Procedure Laterality Date   BIOPSY  01/22/2022   Procedure: BIOPSY;  Surgeon: Marguerita Merles, Reuel Boom, MD;  Location: [facility];  Service: Gastroenterology;;   Fidela Salisbury RELEASE Bilateral 2006   ESOPHAGOGASTRODUODENOSCOPY (EGD) WITH PROPOFOL N/A 01/22/2022   Procedure: ESOPHAGOGASTRODUODENOSCOPY (EGD) WITH PROPOFOL;  Surgeon: Dolores Frame, MD;  Location: [facility];  Service: Gastroenterology;  Laterality: N/A;  205   hysteroscopy     age 62   mastectomy Bilateral 10/22/2019   pt had necrosis after 1st surgery, had another surgery to remove implants 12/10/2019   MOUTH SURGERY     TUBAL LIGATION     TUBAL LIGATION Bilateral 1998   WISDOM TOOTH EXTRACTION     early 57's    Social History: Social History   Socioeconomic History  Marital status: Married    Spouse name: Not on file   Number of children: 2   Years of education: Not on file   Highest education level: GED or equivalent  Occupational History   Occupation: Disability  Tobacco Use   Smoking status: Every Day    Packs/day: 0.50    Years: 40.00    Additional pack years: 0.00    Total pack years: 20.00    Types: Cigarettes   Smokeless tobacco: Never   Tobacco comments:    Smokes half a pack a day for 40 years.   Vaping Use   Vaping Use: Never used  Substance and Sexual Activity   Alcohol use: Never   Drug use: Never   Sexual activity: Yes  Other Topics Concern   Not on file  Social History Narrative   Lives with her husband.    Social Determinants of Health   Financial Resource Strain: Medium Risk (06/06/2023)   Overall Financial Resource Strain (CARDIA)    Difficulty of Paying Living Expenses: Somewhat hard  Food Insecurity: Food Insecurity Present (06/06/2023)   Hunger Vital Sign    Worried About Running Out of Food  in the Last Year: Sometimes true    Ran Out of Food in the Last Year: Sometimes true  Transportation Needs: No Transportation Needs (06/06/2023)   PRAPARE - Administrator, Civil Service (Medical): No    Lack of Transportation (Non-Medical): No  Physical Activity: Insufficiently Active (06/06/2023)   Exercise Vital Sign    Days of Exercise per Week: 2 days    Minutes of Exercise per Session: 20 min  Stress: Stress Concern Present (06/06/2023)   [employer] of Occupational Health - Occupational Stress Questionnaire    Feeling of Stress : Rather much  Social Connections: Unknown (06/06/2023)   Social Connection and Isolation Panel [NHANES]    Frequency of Communication with Friends and Family: Twice a week    Frequency of Social Gatherings with Friends and Family: Once a week    Attends Religious Services: Patient declined    Database administrator or Organizations: No    Attends Engineer, structural: Not on file    Marital Status: Married  Catering manager Violence: Not At Risk (07/03/2021)   Humiliation, Afraid, Rape, and Kick questionnaire    Fear of Current or Ex-Partner: No    Emotionally Abused: No    Physically Abused: No    Sexually Abused: No    Family History: Family History  Problem Relation Age of Onset   Leukemia Mother    Breast cancer Sister    Diabetes Maternal Uncle    Hyperlipidemia Maternal Uncle    Diabetes Mellitus II Maternal Grandmother    Heart disease Maternal Grandfather    Diabetes Maternal Grandfather    Breast cancer Cousin    Breast cancer Half-Sister    Osteoporosis Half-Sister    Lupus Half-Sister    Lupus Half-Sister    Osteoporosis Half-Sister    Cancer - Lung Neg Hx    Colon cancer Neg Hx     Current Medications:  Current Outpatient Medications:    albuterol (VENTOLIN HFA) 108 (90 Base) MCG/ACT inhaler, INHALE 2 PUFFS BY MOUTH EVERY 4 HOURS AS NEEDED FOR WHEEZING FOR SHORTNESS OF BREATH, Disp: 9 g, Rfl: 3    amLODipine (NORVASC) 5 MG tablet, Take 1 tablet (5 mg total) by mouth daily., Disp: 90 tablet, Rfl: 2   Ascorbic Acid (VITAMIN C) 1000 MG tablet,  Take 1,000 mg by mouth daily., Disp: , Rfl:    aspirin 81 MG EC tablet, Take 81 mg by mouth daily., Disp: , Rfl:    butalbital-acetaminophen-caffeine (FIORICET) 50-325-40 MG tablet, Take 1 tablet by mouth every 4 (four) hours as needed for headache., Disp: 30 tablet, Rfl: 0   Calcium-Magnesium-Vitamin D (CALCIUM 1200+D3 PO), Take 1 tablet by mouth daily., Disp: , Rfl:    cetirizine-pseudoephedrine (ZYRTEC-D) 5-120 MG tablet, Take 1 tablet by mouth daily., Disp: , Rfl:    CINNAMON PO, Take 1,000 mg by mouth daily., Disp: , Rfl:    cyanocobalamin 1000 MCG tablet, Take 1 tablet (1,000 mcg total) by mouth daily. (Patient taking differently: Take 1,000 mcg by mouth every other day.), Disp: 30 tablet, Rfl: 6   cyclobenzaprine (FLEXERIL) 10 MG tablet, Take 1 tablet (10 mg total) by mouth 2 (two) times daily as needed for muscle spasms., Disp: 60 tablet, Rfl: 5   denosumab (XGEVA) 120 MG/1.7ML SOLN injection, Inject 120 mg into the skin every 30 (thirty) days., Disp: , Rfl:    eszopiclone (LUNESTA) 1 MG TABS tablet, Take 1 tablet (1 mg total) by mouth at bedtime as needed for sleep. Take immediately before bedtime, Disp: 30 tablet, Rfl: 0   fulvestrant (FASLODEX) 250 MG/5ML injection, Inject 500 mg into the muscle every 30 (thirty) days. One injection each buttock over 1-2 minutes. Warm prior to use., Disp: , Rfl:    furosemide (LASIX) 20 MG tablet, Take 1 tablet (20 mg total) by mouth daily as needed., Disp: 30 tablet, Rfl: 0   HYDROcodone-acetaminophen (NORCO) 5-325 MG tablet, Take 1 tablet by mouth every 12 (twelve) hours as needed for moderate pain., Disp: 30 tablet, Rfl: 0   IBRANCE 100 MG tablet, TAKE 1 TABLET BY MOUTH 1 TIME A DAY ON DAYS 1 TO 21 OF A 28 DAY CYCLE, Disp: 21 tablet, Rfl: 4   lansoprazole (PREVACID) 30 MG capsule, TAKE 1 CAPSULE BY MOUTH IN THE  MORNING AND AT BEDTIME, Disp: 120 capsule, Rfl: 0   Misc. Devices MISC, Please provide patient with left arm compression sleeve. Diagnosis metastatic breast cancer, bilateral mastectomy, left arm lymphedema, Disp: 1 each, Rfl: 0   NAPHCON-A 0.025-0.3 % ophthalmic solution, Place 2 drops into both eyes in the morning and at bedtime., Disp: , Rfl:    predniSONE (DELTASONE) 50 MG tablet, Take 1 tab 13hrs, 7hrs and 1hr prior to scan., Disp: 3 tablet, Rfl: 0   diphenhydrAMINE (BENADRYL) 50 MG tablet, Take 1 tablet (50 mg total) by mouth once for 1 dose. 1hr prior to scan, Disp: 1 tablet, Rfl: 0   Allergies: Allergies  Allergen Reactions   Augmentin [Amoxicillin-Pot Clavulanate] Anaphylaxis   Ciprofloxacin Anaphylaxis   Claritin [Loratadine] Anaphylaxis   Gabapentin Anaphylaxis   Keflex [Cephalexin] Anaphylaxis   Levofloxacin Anaphylaxis   Lisinopril Swelling   Lisinopril Anaphylaxis   Metoprolol Swelling   Metoprolol Anaphylaxis   Nexium [Esomeprazole] Anaphylaxis   Sulfa Antibiotics Hives and Swelling   Tetracyclines & Related Anaphylaxis   Iohexol Hives    Needs pre meds   Ultram [Tramadol] Other (See Comments)    Patient states medication irritates throat    Ceftin [Cefuroxime Axetil] Hives   Chromium Other (See Comments)    headache   Iodinated Contrast Media Hives   Misc. Sulfonamide Containing Compounds Other (See Comments)   Oxycodone Itching   Sulfa Antibiotics     Possible Reaction    Latex Rash    itching   Nickel  Rash    itching    REVIEW OF SYSTEMS:   Review of Systems  Constitutional:  Negative for chills, fatigue and fever.  HENT:   Negative for lump/mass, mouth sores, nosebleeds, sore throat and trouble swallowing.   Eyes:  Negative for eye problems.  Respiratory:  Positive for cough and shortness of breath.   Cardiovascular:  Negative for chest pain, leg swelling and palpitations.  Gastrointestinal:  Positive for constipation. Negative for abdominal pain,  diarrhea, nausea and vomiting.  Genitourinary:  Negative for bladder incontinence, difficulty urinating, dysuria, frequency, hematuria and nocturia.   Musculoskeletal:  Positive for arthralgias and myalgias. Negative for back pain, flank pain and neck pain.  Skin:  Negative for itching and rash.  Neurological:  Positive for headaches and numbness. Negative for dizziness.  Hematological:  Does not bruise/bleed easily.  Psychiatric/Behavioral:  Positive for sleep disturbance. Negative for depression and suicidal ideas. The patient is not nervous/anxious.   All other systems reviewed and are negative.    VITALS:   Blood pressure (!) 141/73, pulse 94, temperature 98.5 F (36.9 C), temperature source Tympanic, resp. rate 20, weight 187 lb 14.4 oz (85.2 kg), last menstrual period 06/08/2012, SpO2 98 %.  Wt Readings from Last 3 Encounters:  07/08/23 187 lb 14.4 oz (85.2 kg)  06/10/23 185 lb 3.2 oz (84 kg)  04/14/23 185 lb 3.2 oz (84 kg)    Body mass index is 31.75 kg/m.  Performance status (ECOG): 1 - Symptomatic but completely ambulatory  PHYSICAL EXAM:   Physical Exam Vitals and nursing note reviewed. Exam conducted with a chaperone present.  Constitutional:      Appearance: Normal appearance.  Cardiovascular:     Rate and Rhythm: Normal rate and regular rhythm.     Pulses: Normal pulses.     Heart sounds: Normal heart sounds.  Pulmonary:     Effort: Pulmonary effort is normal.     Breath sounds: Normal breath sounds.  Abdominal:     Palpations: Abdomen is soft. There is no hepatomegaly, splenomegaly or mass.     Tenderness: There is no abdominal tenderness.  Musculoskeletal:     Right lower leg: No edema.     Left lower leg: No edema.  Lymphadenopathy:     Cervical: No cervical adenopathy.     Right cervical: No superficial, deep or posterior cervical adenopathy.    Left cervical: No superficial, deep or posterior cervical adenopathy.     Upper Body:     Right upper body:  No supraclavicular or axillary adenopathy.     Left upper body: No supraclavicular or axillary adenopathy.  Neurological:     General: No focal deficit present.     Mental Status: She is alert and oriented to person, place, and time.  Psychiatric:        Mood and Affect: Mood normal.        Behavior: Behavior normal.     LABS:      Latest Ref Rng & Units 07/01/2023   11:04 AM 06/10/2023   12:25 PM 05/13/2023    9:33 AM  CBC  WBC 4.0 - 10.5 K/uL 3.0  3.0  2.7   Hemoglobin 12.0 - 15.0 g/dL 62.1  30.8  65.7   Hematocrit 36.0 - 46.0 % 41.7  38.3  41.1   Platelets 150 - 400 K/uL 323  225  197       Latest Ref Rng & Units 07/01/2023   11:04 AM 06/10/2023   12:25 PM  05/13/2023    9:33 AM  CMP  Glucose 70 - 99 mg/dL 92  95  98   BUN 8 - 23 mg/dL 17  17  15    Creatinine 0.44 - 1.00 mg/dL 1.61  0.96  0.45   Sodium 135 - 145 mmol/L 139  137  139   Potassium 3.5 - 5.1 mmol/L 3.9  3.7  3.5   Chloride 98 - 111 mmol/L 106  105  105   CO2 22 - 32 mmol/L 23  23  25    Calcium 8.9 - 10.3 mg/dL 40.9  9.7  9.8   Total Protein 6.5 - 8.1 g/dL 7.9  7.5  7.7   Total Bilirubin 0.3 - 1.2 mg/dL 0.9  0.5  1.0   Alkaline Phos 38 - 126 U/L 57  54  57   AST 15 - 41 U/L 55  43  43   ALT 0 - 44 U/L 75  59  59      No results found for: "CEA1", "CEA" / No results found for: "CEA1", "CEA" No results found for: "PSA1" No results found for: "WJX914" No results found for: "CAN125"  No results found for: "TOTALPROTELP", "ALBUMINELP", "A1GS", "A2GS", "BETS", "BETA2SER", "GAMS", "MSPIKE", "SPEI" No results found for: "TIBC", "FERRITIN", "IRONPCTSAT" No results found for: "LDH"   STUDIES:   No results found.

## 2023-07-08 NOTE — Progress Notes (Signed)
Xgeva and faslogex injections given per orders. Patient tolerated it well without problems. Vitals stable and discharged home from clinic ambulatory. Follow up as scheduled.

## 2023-07-08 NOTE — Patient Instructions (Addendum)
[facility] at [facility] Discharge Instructions   You were seen and examined today by Dr. Ellin Saba.  He reviewed the results of your lab work. Your cancer tumor markers keep gradually increasing. Dr. Kirtland Bouchard recommends getting a PET scan to investigate this further.   He also recommends doing a blood test called Guardant 360. This will help [place] determine if your cancer has mutated. This can help [place] with treatment options if we need to switch treatments.   Return as scheduled.    Thank you for choosing [facility] at [facility] to provide your oncology and hematology care.  To afford each patient quality time with our provider, please arrive at least 15 minutes before your scheduled appointment time.   If you have a lab appointment with the Cancer Center please come in thru the Main Entrance and check in at the main information desk.  You need to re-schedule your appointment should you arrive 10 or more minutes late.  We strive to give you quality time with our providers, and arriving late affects you and other patients whose appointments are after yours.  Also, if you no show three or more times for appointments you may be dismissed from the clinic at the providers discretion.     Again, thank you for choosing [facility].  Our hope is that these requests will decrease the amount of time that you wait before being seen by our physicians.       _____________________________________________________________  Should you have questions after your visit to [facility], please contact our office at [phone] and follow the prompts.  Our office hours are 8:00 a.m. and 4:30 p.m. Monday - Friday.  Please note that voicemails left after 4:00 p.m. may not be returned until the following business day.  We are closed weekends and major holidays.  You do have access to a nurse 24-7, just call the main number to the clinic [phone] and  do not press any options, hold on the line and a nurse will answer the phone.    For prescription refill requests, have your pharmacy contact our office and allow 72 hours.    Due to Covid, you will need to wear a mask upon entering the hospital. If you do not have a mask, a mask will be given to you at the Main Entrance upon arrival. For doctor visits, patients may have 1 support person age 91 or older with them. For treatment visits, patients can not have anyone with them due to social distancing guidelines and our immunocompromised population.

## 2023-07-08 NOTE — Progress Notes (Signed)
Patient is taking Ibrance as prescribed.  She has not missed any doses and reports no side effects at this time.   °

## 2023-07-10 ENCOUNTER — Other Ambulatory Visit: Payer: Self-pay | Admitting: *Deleted

## 2023-07-10 MED ORDER — HYDROCODONE-ACETAMINOPHEN 5-325 MG PO TABS: 1.0000 | ORAL_TABLET | Freq: Two times a day (BID) | ORAL | 0 refills | Status: DC | PRN

## 2023-07-10 MED ORDER — ALPRAZOLAM 0.5 MG PO TABS: 0.5000 mg | ORAL_TABLET | Freq: Once | ORAL | 0 refills | Status: AC

## 2023-07-15 ENCOUNTER — Ambulatory Visit (INDEPENDENT_AMBULATORY_CARE_PROVIDER_SITE_OTHER): Payer: 59 | Admitting: Gastroenterology

## 2023-07-15 ENCOUNTER — Encounter (INDEPENDENT_AMBULATORY_CARE_PROVIDER_SITE_OTHER): Payer: Self-pay | Admitting: Gastroenterology

## 2023-07-15 VITALS — BP 138/78 | HR 100 | Temp 97.5°F | Ht 64.5 in | Wt 186.8 lb

## 2023-07-15 DIAGNOSIS — R1310 Dysphagia, unspecified: Secondary | ICD-10-CM

## 2023-07-15 DIAGNOSIS — K219 Gastro-esophageal reflux disease without esophagitis: Secondary | ICD-10-CM | POA: Diagnosis not present

## 2023-07-15 MED ORDER — LANSOPRAZOLE 30 MG PO CPDR
DELAYED_RELEASE_CAPSULE | ORAL | 3 refills | Status: DC
Start: 2023-07-15 — End: 2024-07-12

## 2023-07-15 NOTE — Progress Notes (Unsigned)
Referring Provider: Billie Lade, MD Primary Care Physician:  Billie Lade, MD Primary GI Physician: Dr. Levon Hedger   Chief Complaint  Patient presents with   Follow-up    Patient here today for her yearly follow up appointment. Patient is still having issues with swallowing and having food get stuck in her throat. She is taking Lansoprazole 30 mg once per day. She says she is supposed to be on bid. She says she thinks the once per day is helping.    HPI:   Wendy Zamora is a 62 y.o. female with past medical history of metastatic breast cancer s/p bilateral mastectomy, radiation therapy and currently on chemotherapy on low dose Ibrance and Faslodex   Patient presenting today for follow up of chronic dysphagia   Last seen April 2023, at that time still feeling foods getting stuck in her chest, sometimes can happen with liquids, mostly meats. Reported dysphagia since starting Imbrance. Some discomfort in epigastric area.   Recommended to proceed with schedule esophageal manometry, tylenol for lower chest pain  Manometry as below, was followed by esophagram, referred to [facility] for further evaluation of her dysphagia.  She was seen at [facility] by Dr. 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 She is unsure of the result of this(I do not see it in the chart review)  She reports dysphagia worsened about 5-6 months ago, she feels that food sits in mid chest. She has a dry cough that she attributes to the [place]. She notes that she has a lump in her mid chest after having double mastectomy, sometimes she can manipulate this area and foods will pass down. Sometimes liquids can also her to feel like she is going to choke. She is having heartburn almost daily  though notes that she is only taking lansoprazole once daily and feels it controls her symptoms better than BID? She states she never took the famotidine that was prescribed to her by [facility]. She also did not have EGD with [facility] as recommended.    Barium esophagram: 04/23/22: tiny sliding hiatal hernia, no stricture or achalasia Esophageal manometry 03/2022: elevated IRP of 36.1 mmHg with presence of deglution WNL.  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  modified barium swallow that showed decreased muscle contraction but there was no official report about this.  Last EGD: 01/22/22 - No endoscopic esophageal abnormality to explain patient's dysphagia. Dilated. Biopsied. - 2 cm hiatal hernia. - Erosive gastropathy with no stigmata of recent bleeding. Biopsied. - Normal examined duodenum. Last Colonoscopy: 11/03/2020, cecal and rectal polyp between 3 to 1 cm in size removed with a hot snare. No pathology available   Path A. STOMACH, BIOPSY:  - Gastric antral mucosa with nonspecific reactive gastropathy  - Gastric oxyntic mucosa with parietal cell hyperplasia as can be seen  in hypergastrinemic states such as PPI therapy.  - Helicobacter pylori-like organisms are not identified on routine HE  stain  B. ESOPHAGUS, DISTAL, BIOPSY:  - Esophageal squamous mucosa with no specific histopathologic changes  - Negative for increased intraepithelial eosinophils C. ESOPHAGUS, MID, BIOPSY:  - Esophageal squamous mucosa with no specific histopathologic changes  - Negative for increased intraepithelial eosinophils     Recommendations:    Past Medical History:  Diagnosis Date   Allergy  Asthma    Asthma    diagnosed age 50   Bone metastasis 08/2020   was on prolia   Breast cancer [facility])    started chemo 05/05/2019. finished 08/2019   Gangrene (HCC)    GERD (gastroesophageal reflux disease)    Hx of bilateral mastectomy 10/22/2019   Hypertension    Hypertension    started bp meds 2008   Lymphedema  of arm    left arm   Osteopenia    Shingles 04/20/2020   left side    Past Surgical History:  Procedure Laterality Date   BIOPSY  01/22/2022   Procedure: BIOPSY;  Surgeon: Marguerita Merles, Reuel Boom, MD;  Location: [facility];  Service: Gastroenterology;;   Fidela Salisbury RELEASE Bilateral 2006   ESOPHAGOGASTRODUODENOSCOPY (EGD) WITH PROPOFOL N/A 01/22/2022   Procedure: ESOPHAGOGASTRODUODENOSCOPY (EGD) WITH PROPOFOL;  Surgeon: Dolores Frame, MD;  Location: [facility];  Service: Gastroenterology;  Laterality: N/A;  205   hysteroscopy     age 89   mastectomy Bilateral 10/22/2019   pt had necrosis after 1st surgery, had another surgery to remove implants 12/10/2019   MOUTH SURGERY     TUBAL LIGATION     TUBAL LIGATION Bilateral 1998   WISDOM TOOTH EXTRACTION     early 30's    Current Outpatient Medications  Medication Sig Dispense Refill   albuterol (VENTOLIN HFA) 108 (90 Base) MCG/ACT inhaler INHALE 2 PUFFS BY MOUTH EVERY 4 HOURS AS NEEDED FOR WHEEZING FOR SHORTNESS OF BREATH 9 g 3   amLODipine (NORVASC) 5 MG tablet Take 1 tablet (5 mg total) by mouth daily. 90 tablet 2   Ascorbic Acid (VITAMIN C) 1000 MG tablet Take 1,000 mg by mouth daily.     aspirin 81 MG EC tablet Take 81 mg by mouth daily.     Calcium-Magnesium-Vitamin D (CALCIUM 1200+D3 PO) Take 1 tablet by mouth daily.     cetirizine-pseudoephedrine (ZYRTEC-D) 5-120 MG tablet Take 1 tablet by mouth daily.     CINNAMON PO Take 1,000 mg by mouth daily.     cyanocobalamin 1000 MCG tablet Take 1 tablet (1,000 mcg total) by mouth daily. (Patient taking differently: Take 1,000 mcg by mouth every other day.) 30 tablet 6   cyclobenzaprine (FLEXERIL) 10 MG tablet Take 1 tablet (10 mg total) by mouth 2 (two) times daily as needed for muscle spasms. 60 tablet 5   denosumab (XGEVA) 120 MG/1.7ML SOLN injection Inject 120 mg into the skin every 30 (thirty) days.     fulvestrant (FASLODEX) 250 MG/5ML injection Inject 500 mg  into the muscle every 30 (thirty) days. One injection each buttock over 1-2 minutes. Warm prior to use.     HYDROcodone-acetaminophen (NORCO) 5-325 MG tablet Take 1 tablet by mouth every 12 (twelve) hours as needed for moderate pain. 30 tablet 0   IBRANCE 100 MG tablet TAKE 1 TABLET BY MOUTH 1 TIME A DAY ON DAYS 1 TO 21 OF A 28 DAY CYCLE 21 tablet 4   lansoprazole (PREVACID) 30 MG capsule TAKE 1 CAPSULE BY MOUTH IN THE MORNING AND AT BEDTIME 120 capsule 0   Misc. Devices MISC Please provide patient with left arm compression sleeve. Diagnosis metastatic breast cancer, bilateral mastectomy, left arm lymphedema 1 each 0   NAPHCON-A 0.025-0.3 % ophthalmic solution Place 2 drops into both eyes in the morning and at bedtime.     butalbital-acetaminophen-caffeine (FIORICET) 50-325-40 MG tablet Take 1 tablet by mouth every 4 (four) hours as needed for  headache. (Patient not taking: Reported on 07/15/2023) 30 tablet 0   No current facility-administered medications for this visit.    Allergies as of 07/15/2023 - Review Complete 07/15/2023  Allergen Reaction Noted   Augmentin [amoxicillin-pot clavulanate] Anaphylaxis 07/03/2021   Ciprofloxacin Anaphylaxis 07/03/2021   Claritin [loratadine] Anaphylaxis 07/03/2021   Gabapentin Anaphylaxis 07/03/2021   Keflex [cephalexin] Anaphylaxis 07/03/2021   Levofloxacin Anaphylaxis 07/03/2021   Lisinopril Swelling 06/08/2012   Lisinopril Anaphylaxis 07/03/2021   Metoprolol Swelling 06/08/2012   Metoprolol Anaphylaxis 07/03/2021   Nexium [esomeprazole] Anaphylaxis 07/03/2021   Sulfa antibiotics Hives and Swelling 06/08/2012   Tetracyclines & related Anaphylaxis 07/03/2021   Iohexol Hives 09/16/2022   Ultram [tramadol] Other (See Comments) 03/18/2023   Ceftin [cefuroxime axetil] Hives 06/08/2012   Chromium Other (See Comments) 07/03/2021   Iodinated contrast media Hives 03/04/2023   Misc. sulfonamide containing compounds Other (See Comments) 10/22/2022    Oxycodone Itching 07/03/2021   Sulfa antibiotics  04/03/2015   Latex Rash 07/03/2021   Nickel Rash 07/03/2021    Family History  Problem Relation Age of Onset   Leukemia Mother    Breast cancer Sister    Diabetes Maternal Uncle    Hyperlipidemia Maternal Uncle    Diabetes Mellitus II Maternal Grandmother    Heart disease Maternal Grandfather    Diabetes Maternal Grandfather    Breast cancer Cousin    Breast cancer Half-Sister    Osteoporosis Half-Sister    Lupus Half-Sister    Lupus Half-Sister    Osteoporosis Half-Sister    Cancer - Lung Neg Hx    Colon cancer Neg Hx     Social History   Socioeconomic History   Marital status: Married    Spouse name: Not on file   Number of children: 2   Years of education: Not on file   Highest education level: GED or equivalent  Occupational History   Occupation: Disability  Tobacco Use   Smoking status: Every Day    Current packs/day: 0.50    Average packs/day: 0.5 packs/day for 40.0 years (20.0 ttl pk-yrs)    Types: Cigarettes   Smokeless tobacco: Never   Tobacco comments:    Smokes half a pack a day for 40 years.   Vaping Use   Vaping status: Never Used  Substance and Sexual Activity   Alcohol use: Never   Drug use: Never   Sexual activity: Yes  Other Topics Concern   Not on file  Social History Narrative   Lives with her husband.    Social Determinants of Health   Financial Resource Strain: Medium Risk (06/06/2023)   Overall Financial Resource Strain (CARDIA)    Difficulty of Paying Living Expenses: Somewhat hard  Food Insecurity: Food Insecurity Present (06/06/2023)   Hunger Vital Sign    Worried About Running Out of Food in the Last Year: Sometimes true    Ran Out of Food in the Last Year: Sometimes true  Transportation Needs: No Transportation Needs (06/06/2023)   PRAPARE - Administrator, Civil Service (Medical): No    Lack of Transportation (Non-Medical): No  Physical Activity: Insufficiently Active  (06/06/2023)   Exercise Vital Sign    Days of Exercise per Week: 2 days    Minutes of Exercise per Session: 20 min  Stress: Stress Concern Present (06/06/2023)   [employer] of Occupational Health - Occupational Stress Questionnaire    Feeling of Stress : Rather much  Social Connections: Unknown (06/06/2023)   Social Connection and Isolation  Panel [NHANES]    Frequency of Communication with Friends and Family: Twice a week    Frequency of Social Gatherings with Friends and Family: Once a week    Attends Religious Services: Patient declined    Database administrator or Organizations: No    Attends Engineer, structural: Not on file    Marital Status: Married   Review of systems General: negative for malaise, night sweats, fever, chills, weight loss  Neck: Negative for lumps, goiter, pain and significant neck swelling Resp: Negative for cough, wheezing, dyspnea at rest CV: Negative for chest pain, leg swelling, palpitations, orthopnea GI: denies melena, hematochezia, nausea, vomiting, diarrhea, constipation, dysphagia, odyonophagia, early satiety or unintentional weight loss.  MSK: Negative for joint pain or swelling, back pain, and muscle pain. Derm: Negative for itching or rash Psych: Denies depression, anxiety, memory loss, confusion. No homicidal or suicidal ideation.  Heme: Negative for prolonged bleeding, bruising easily, and swollen nodes. Endocrine: Negative for cold or heat intolerance, polyuria, polydipsia and goiter. Neuro: negative for tremor, gait imbalance, syncope and seizures. The remainder of the review of systems is noncontributory.  Physical Exam: BP 138/78 (BP Location: Left Arm, Patient Position: Sitting, Cuff Size: Large)   Pulse 100   Temp (!) 97.5 F (36.4 C) (Temporal)   Ht 5' 4.5" (1.638 m)   Wt 186 lb 12.8 oz (84.7 kg)   LMP 06/08/2012   BMI 31.57 kg/m  General:   Alert and oriented. No distress noted. Pleasant and cooperative.  Head:   Normocephalic and atraumatic. Eyes:  Conjuctiva clear without scleral icterus. Mouth:  Oral mucosa pink and moist. Good dentition. No lesions. Heart: Normal rate and rhythm, s1 and s2 heart sounds present.  Lungs: Clear lung sounds in all lobes. Respirations equal and unlabored. Abdomen:  +BS, soft, non-tender and non-distended. No rebound or guarding. No HSM or masses noted. Derm: No palmar erythema or jaundice Msk:  Symmetrical without gross deformities. Normal posture. Extremities:  Without edema. Neurologic:  Alert and  oriented x4 Psych:  Alert and cooperative. Normal mood and affect.  Invalid input(s): "6 MONTHS"   ASSESSMENT: Wendy Zamora is a 62 y.o. female presenting today    PLAN:  Increase lansoprazole back to BID dosing  2.  Refer back to [facility] for EGD with endoflip to evaluate for EGJOO 3. Chewing precautions  4.    Follow Up: ***  Athziri Freundlich L. Jeanmarie Hubert, MSN, APRN, AGNP-C Adult-Gerontology Nurse Practitioner [facility]

## 2023-07-15 NOTE — Patient Instructions (Signed)
Please increase your lansoprazole 30mg  back to twice daily dosing Be mindful of greasy, spicy, fried, citrus foods, caffeine, carbonated drinks, chocolate and alcohol as these can increase reflux symptoms, Stay upright 2-3 hours after eating, prior to lying down and avoid eating late in the evenings.  I will send a referral back to [facility] for them to get you set up for repeat EGD as recommended when you saw them last year  Continue to take small bites, chew thoroughly, take sips of liquid between bites   Follow up 6 months

## 2023-07-16 ENCOUNTER — Other Ambulatory Visit: Payer: Self-pay

## 2023-07-16 DIAGNOSIS — C50919 Malignant neoplasm of unspecified site of unspecified female breast: Secondary | ICD-10-CM | POA: Diagnosis not present

## 2023-07-17 ENCOUNTER — Ambulatory Visit (HOSPITAL_COMMUNITY)
Admission: RE | Admit: 2023-07-17 | Discharge: 2023-07-17 | Disposition: A | Discharge status: Home / Self Care | Payer: 59 | Source: Ambulatory Visit | Attending: Hematology | Admitting: Hematology

## 2023-07-17 DIAGNOSIS — C50919 Malignant neoplasm of unspecified site of unspecified female breast: Secondary | ICD-10-CM | POA: Insufficient documentation

## 2023-07-17 DIAGNOSIS — C50912 Malignant neoplasm of unspecified site of left female breast: Secondary | ICD-10-CM | POA: Diagnosis not present

## 2023-07-17 MED ORDER — FLUDEOXYGLUCOSE F - 18 (FDG) INJECTION
8.9300 | Freq: Once | INTRAVENOUS | Status: AC | PRN
  Administered 2023-07-17: 8.93 via INTRAVENOUS

## 2023-07-18 ENCOUNTER — Telehealth (INDEPENDENT_AMBULATORY_CARE_PROVIDER_SITE_OTHER): Payer: Self-pay

## 2023-07-18 ENCOUNTER — Other Ambulatory Visit (INDEPENDENT_AMBULATORY_CARE_PROVIDER_SITE_OTHER): Payer: Self-pay | Admitting: *Deleted

## 2023-07-18 ENCOUNTER — Telehealth (INDEPENDENT_AMBULATORY_CARE_PROVIDER_SITE_OTHER): Payer: Self-pay | Admitting: *Deleted

## 2023-07-18 NOTE — Telephone Encounter (Signed)
Lansoprazole 30 mg Approved from 07/17/2023-07/17/2024

## 2023-07-18 NOTE — Telephone Encounter (Signed)
Pt.notified

## 2023-07-22 NOTE — Progress Notes (Signed)
[facility] [address] [place] [address]    Clinic Day:  07/23/23   Referring physician: Billie Lade, MD  Patient Care Team: Billie Lade, MD as PCP - General (Internal Medicine) [name], RN as Oncology Nurse Navigator (Oncology) [name], MD as Medical Oncologist (Medical Oncology)   ASSESSMENT & PLAN:   Assessment: 1.  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 - Started on Ibrance and Faslodex in December 2021 for metastatic disease. - PET scan on 08/10/2021 with no evidence of hypermetabolic metastatic disease.  Widespread scattered sclerotic bone metastasis in the spine and pelvis are stable and not metabolically active.  No lymphadenopathy.  Asymmetric muscular uptake in the left lower scalene muscles and in the intercostal muscles of the ventral upper left chest wall without discrete mass correlate on the CT images, favoring activity related uptake. [name] was dose reduced to 100 mg 3 weeks on/1 week off on 10/11/2021 due to fatigue.  2.  Social/family history: - She is a retired Engineer, structural.  She recently moved back from [place]. - Younger sister had breast cancer in her 18s.  Mother died of leukemia at age 35.  Paternal cousin also had breast cancer.  3.  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  She underwent immediate  implants which were later removed on 12/10/2019 secondary to infection and necrosis. - XRT to the chest wall and regional lymph nodes, 60 Gray, from 02/29/2020 through 04/12/2020 - She was treated with adjuvant anastrozole which was started 12/09/2019 and was found to have metastatic disease in November 2021. - Previous genetic testing was reportedly negative and [place].    Plan: 1.  Metastatic breast cancer to the bones, ER/PR positive, HER2 negative: - She is tolerating Ibrance and Faslodex very well. - Recent tumor marker levels have been uptrending over the last 3 times. - I have reviewed PET scan from 07/17/2023: Intense metabolic activity in the T7 and T9 vertebral bodies which was not seen on the prior PET scan.  However these lesions were evident in the PET scan prior to starting Ibrance and Faslodex.  Mild activity associated with sclerotic lesion in the posterior right acetabulum. - I have also reviewed results of Guardant360: No targetable mutations including ESR 1 and PIK3CA.  MSI high not detected. - I have recommended switching to ribociclib along with exemestane. - Twelve-lead EKG today showed QTc of 455 with normal sinus rhythm. - We will obtain ribociclib from specialty pharmacy.  We discussed side effects in detail.  She does not have any cardiac issues.   2.  Peripheral neuropathy: - Constant tingling or numbness in the hands and feet is stable.  Not on any medication.  3.  Bone metastatic disease: - Continue denosumab monthly.  Last calcium was 10.1.  Continue calcium and vitamin D supplements.  4.  Sleeplessness: - She is no longer requiring Ambien.   Orders Placed This Encounter  Procedures  NM PET Image Restag (PS) Skull Base To Thigh    Standing Status:   Future    Standing Expiration Date:   07/22/2024    Order Specific Question:   If indicated for the ordered procedure, I authorize the administration of a radiopharmaceutical per Radiology protocol     Answer:   Yes    Order Specific Question:   Preferred imaging location?    Answer:   [facility]   CBC with Differential    Standing Status:   Standing    Number of Occurrences:   10    Standing Expiration Date:   07/22/2024   Comprehensive metabolic panel    Standing Status:   Standing    Number of Occurrences:   10    Standing Expiration Date:   07/22/2024   Cancer antigen 15-3    Standing Status:   Standing    Number of Occurrences:   10    Standing Expiration Date:   07/22/2024   Cancer antigen 27.29    Standing Status:   Standing    Number of Occurrences:   10    Standing Expiration Date:   07/22/2024   Magnesium    Standing Status:   Standing    Number of Occurrences:   10    Standing Expiration Date:   07/22/2024   EKG 12-Lead      I,Helena R Teague,acting as a scribe for [name], MD.,have documented all relevant documentation on the behalf of [name], MD,as directed by  [name], MD while in the presence of [name], MD.  I, [name] MD, have reviewed the above documentation for accuracy and completeness, and I agree with the above.     [name], MD   7/24/20247:28 PM  CHIEF COMPLAINT:   Diagnosis: metastatic breast cancer    Cancer Staging  No matching staging information was found for the patient.    Prior Therapy: none  Current Therapy:  Palbociclib + Fulvestrant every 4 weeks    HISTORY OF PRESENT ILLNESS:   Oncology History  Primary malignant neoplasm of breast with metastasis (HCC)  07/06/2021 Initial Diagnosis   Metastatic breast cancer (HCC)   07/18/2021 - 08/15/2022 Chemotherapy   Patient is on Treatment Plan : BREAST Palbociclib + Fulvestrant q28d     07/18/2021 -  Chemotherapy   Patient is on Treatment Plan : BREAST Palbociclib D1-21 + Fulvestrant q28d        INTERVAL HISTORY:   Wendy Zamora is a 62 y.o. female presenting to clinic today for follow up of metastatic breast cancer.  She was last seen by me on 07/08/23.  She underwent a PET from skull base to thigh on 7/18 that found: intense metabolic activity associated with sclerotic lesions in the T7 and T9 vertebral bodies consistent with active metastatic disease (breast cancer skeletal metastasis recurrence); no metabolic activity associated with sclerotic lesion in the T8 or L4 vertebral body; mild activity associated with sclerotic lesion in the posterior right acetabulum; no soft tissue metastasis; and no lymphadenopathy.   Genetic testing found no matches.   Today, she states that she is doing well overall. Her appetite level is at 50%. Her energy level is at 0%.  She c/o left hip pain that becomes a numbing pain. Her tumor markers have been gradually increasing. She denies any heart problems, but has noticed that her heart rate has been increased over the last couple of months.  PAST MEDICAL HISTORY:   Past Medical  History: Past Medical History:  Diagnosis Date   Allergy    Asthma    Asthma    diagnosed age 29   Bone metastasis 08/2020   was on prolia   Breast cancer (HCC)    started chemo 05/05/2019. finished 08/2019   Gangrene (HCC)    GERD (gastroesophageal reflux disease)    Hx of bilateral mastectomy 10/22/2019   Hypertension    Hypertension    started bp meds 2008   Lymphedema of arm    left arm   Osteopenia    Shingles 04/20/2020   left side    Surgical History: Past Surgical History:  Procedure Laterality Date   BIOPSY  01/22/2022   Procedure: BIOPSY;  Surgeon: Marguerita Merles, Reuel Boom, MD;  Location: [facility];  Service: Gastroenterology;;   Fidela Salisbury RELEASE Bilateral 2006   ESOPHAGOGASTRODUODENOSCOPY (EGD) WITH PROPOFOL N/A 01/22/2022   Procedure: ESOPHAGOGASTRODUODENOSCOPY (EGD) WITH PROPOFOL;  Surgeon: Dolores Frame, MD;  Location: [facility];  Service: Gastroenterology;  Laterality: N/A;  205   hysteroscopy     age 63   mastectomy Bilateral 10/22/2019   pt  had necrosis after 1st surgery, had another surgery to remove implants 12/10/2019   MOUTH SURGERY     TUBAL LIGATION     TUBAL LIGATION Bilateral 1998   WISDOM TOOTH EXTRACTION     early 63's    Social History: Social History   Socioeconomic History   Marital status: Married    Spouse name: Not on file   Number of children: 2   Years of education: Not on file   Highest education level: GED or equivalent  Occupational History   Occupation: Disability  Tobacco Use   Smoking status: Every Day    Current packs/day: 0.50    Average packs/day: 0.5 packs/day for 40.0 years (20.0 ttl pk-yrs)    Types: Cigarettes   Smokeless tobacco: Never   Tobacco comments:    Smokes half a pack a day for 40 years.   Vaping Use   Vaping status: Never Used  Substance and Sexual Activity   Alcohol use: Never   Drug use: Never   Sexual activity: Yes  Other Topics Concern   Not on file  Social History Narrative   Lives with her husband.    Social Determinants of Health   Financial Resource Strain: Medium Risk (06/06/2023)   Overall Financial Resource Strain (CARDIA)    Difficulty of Paying Living Expenses: Somewhat hard  Food Insecurity: Food Insecurity Present (06/06/2023)   Hunger Vital Sign    Worried About Running Out of Food in the Last Year: Sometimes true    Ran Out of Food in the Last Year: Sometimes true  Transportation Needs: No Transportation Needs (06/06/2023)   PRAPARE - Administrator, Civil Service (Medical): No    Lack of Transportation (Non-Medical): No  Physical Activity: Insufficiently Active (06/06/2023)   Exercise Vital Sign    Days of Exercise per Week: 2 days    Minutes of Exercise per Session: 20 min  Stress: Stress Concern Present (06/06/2023)   [employer] of Occupational Health - Occupational Stress Questionnaire    Feeling of Stress : Rather much  Social Connections: Unknown (06/06/2023)   Social Connection and Isolation Panel [NHANES]    Frequency of  Communication with Friends and Family: Twice a week    Frequency of Social Gatherings with Friends and Family: Once a week    Attends Religious Services: Patient declined  Active Member of Clubs or Organizations: No    Attends Banker Meetings: Not on file    Marital Status: Married  Intimate Partner Violence: Not At Risk (03/04/2022)   [facility]   Humiliation, Afraid, Rape, and Kick questionnaire    Fear of Current or Ex-Partner: No    Emotionally Abused: No    Physically Abused: No    Sexually Abused: No    Family History: Family History  Problem Relation Age of Onset   Leukemia Mother    Breast cancer Sister    Diabetes Maternal Uncle    Hyperlipidemia Maternal Uncle    Diabetes Mellitus II Maternal Grandmother    Heart disease Maternal Grandfather    Diabetes Maternal Grandfather    Breast cancer Cousin    Breast cancer Half-Sister    Osteoporosis Half-Sister    Lupus Half-Sister    Lupus Half-Sister    Osteoporosis Half-Sister    Cancer - Lung Neg Hx    Colon cancer Neg Hx     Current Medications:  Current Outpatient Medications:    albuterol (VENTOLIN HFA) 108 (90 Base) MCG/ACT inhaler, INHALE 2 PUFFS BY MOUTH EVERY 4 HOURS AS NEEDED FOR WHEEZING FOR SHORTNESS OF BREATH, Disp: 9 g, Rfl: 3   amLODipine (NORVASC) 5 MG tablet, Take 1 tablet (5 mg total) by mouth daily., Disp: 90 tablet, Rfl: 2   Ascorbic Acid (VITAMIN C) 1000 MG tablet, Take 1,000 mg by mouth daily., Disp: , Rfl:    aspirin 81 MG EC tablet, Take 81 mg by mouth daily., Disp: , Rfl:    butalbital-acetaminophen-caffeine (FIORICET) 50-325-40 MG tablet, Take 1 tablet by mouth every 4 (four) hours as needed for headache., Disp: 30 tablet, Rfl: 0   Calcium-Magnesium-Vitamin D (CALCIUM 1200+D3 PO), Take 1 tablet by mouth daily., Disp: , Rfl:    cetirizine-pseudoephedrine (ZYRTEC-D) 5-120 MG tablet, Take 1 tablet by mouth  daily., Disp: , Rfl:    CINNAMON PO, Take 1,000 mg by mouth daily., Disp: , Rfl:    cyanocobalamin 1000 MCG tablet, Take 1 tablet (1,000 mcg total) by mouth daily. (Patient taking differently: Take 1,000 mcg by mouth every other day.), Disp: 30 tablet, Rfl: 6   cyclobenzaprine (FLEXERIL) 10 MG tablet, Take 1 tablet (10 mg total) by mouth 2 (two) times daily as needed for muscle spasms., Disp: 60 tablet, Rfl: 5   denosumab (XGEVA) 120 MG/1.7ML SOLN injection, Inject 120 mg into the skin every 30 (thirty) days., Disp: , Rfl:    fulvestrant (FASLODEX) 250 MG/5ML injection, Inject 500 mg into the muscle every 30 (thirty) days. One injection each buttock over 1-2 minutes. Warm prior to use., Disp: , Rfl:    HYDROcodone-acetaminophen (NORCO) 5-325 MG tablet, Take 1 tablet by mouth every 12 (twelve) hours as needed for moderate pain., Disp: 30 tablet, Rfl: 0   IBRANCE 100 MG tablet, TAKE 1 TABLET BY MOUTH 1 TIME A DAY ON DAYS 1 TO 21 OF A 28 DAY CYCLE, Disp: 21 tablet, Rfl: 4   lansoprazole (PREVACID) 30 MG capsule, TAKE 1 CAPSULE BY MOUTH IN THE MORNING AND AT BEDTIME, Disp: 120 capsule, Rfl: 3   Misc. Devices MISC, Please provide patient with left arm compression sleeve. Diagnosis metastatic breast cancer, bilateral mastectomy, left arm lymphedema, Disp: 1 each, Rfl: 0   NAPHCON-A 0.025-0.3 % ophthalmic solution, Place 2 drops into both eyes in the morning and at bedtime., Disp: , Rfl:  Allergies: Allergies  Allergen Reactions   Augmentin [Amoxicillin-Pot Clavulanate] Anaphylaxis   Ciprofloxacin Anaphylaxis   Claritin [Loratadine] Anaphylaxis   Gabapentin Anaphylaxis   Keflex [Cephalexin] Anaphylaxis   Levofloxacin Anaphylaxis   Lisinopril Swelling   Lisinopril Anaphylaxis   Metoprolol Swelling   Metoprolol Anaphylaxis   Nexium [Esomeprazole] Anaphylaxis   Sulfa Antibiotics Hives and Swelling   Tetracyclines & Related Anaphylaxis   Iohexol Hives    Needs pre meds   Ultram [Tramadol] Other  (See Comments)    Patient states medication irritates throat    Ceftin [Cefuroxime Axetil] Hives   Chromium Other (See Comments)    headache   Iodinated Contrast Media Hives   Misc. Sulfonamide Containing Compounds Other (See Comments)   Oxycodone Itching   Sulfa Antibiotics     Possible Reaction    Latex Rash    itching   Nickel Rash    itching    REVIEW OF SYSTEMS:   Review of Systems  Constitutional:  Positive for fatigue. Negative for chills and fever.  HENT:   Negative for lump/mass, mouth sores, nosebleeds, sore throat and trouble swallowing.   Eyes:  Negative for eye problems.  Respiratory:  Positive for cough (dry). Negative for shortness of breath.   Cardiovascular:  Positive for leg swelling and palpitations. Negative for chest pain.  Gastrointestinal:  Positive for constipation and diarrhea. Negative for abdominal pain, nausea and vomiting.  Genitourinary:  Negative for bladder incontinence, difficulty urinating, dysuria, frequency, hematuria and nocturia.   Musculoskeletal:  Positive for arthralgias (Hip and spinal pain, 7/10 severit). Negative for back pain, flank pain, myalgias and neck pain.  Skin:  Negative for itching and rash.  Neurological:  Positive for headaches and numbness (in hands and feet). Negative for dizziness.  Hematological:  Does not bruise/bleed easily.  Psychiatric/Behavioral:  Positive for sleep disturbance. Negative for depression and suicidal ideas. The patient is not nervous/anxious.   All other systems reviewed and are negative.    VITALS:   Blood pressure (!) 122/90, pulse 85, temperature 97.9 F (36.6 C), temperature source Oral, resp. rate 18, weight 189 lb (85.7 kg), last menstrual period 06/08/2012, SpO2 98%.  Wt Readings from Last 3 Encounters:  07/23/23 189 lb (85.7 kg)  07/15/23 186 lb 12.8 oz (84.7 kg)  07/08/23 187 lb 14.4 oz (85.2 kg)    Body mass index is 31.94 kg/m.  Performance status (ECOG): 1 - Symptomatic but  completely ambulatory  PHYSICAL EXAM:   Physical Exam Vitals and nursing note reviewed. Exam conducted with a chaperone present.  Constitutional:      Appearance: Normal appearance.  Cardiovascular:     Rate and Rhythm: Normal rate and regular rhythm.     Pulses: Normal pulses.     Heart sounds: Normal heart sounds.  Pulmonary:     Effort: Pulmonary effort is normal.     Breath sounds: Normal breath sounds.  Abdominal:     Palpations: Abdomen is soft. There is no hepatomegaly, splenomegaly or mass.     Tenderness: There is no abdominal tenderness.  Musculoskeletal:     Right lower leg: No edema.     Left lower leg: No edema.  Lymphadenopathy:     Cervical: No cervical adenopathy.     Right cervical: No superficial, deep or posterior cervical adenopathy.    Left cervical: No superficial, deep or posterior cervical adenopathy.     Upper Body:     Right upper body: No supraclavicular or axillary adenopathy.  Left upper body: No supraclavicular or axillary adenopathy.  Neurological:     General: No focal deficit present.     Mental Status: She is alert and oriented to person, place, and time.  Psychiatric:        Mood and Affect: Mood normal.        Behavior: Behavior normal.    LABS:      Latest Ref Rng & Units 07/01/2023   11:04 AM 06/10/2023   12:25 PM 05/13/2023    9:33 AM  CBC  WBC 4.0 - 10.5 K/uL 3.0  3.0  2.7   Hemoglobin 12.0 - 15.0 g/dL 16.1  09.6  04.5   Hematocrit 36.0 - 46.0 % 41.7  38.3  41.1   Platelets 150 - 400 K/uL 323  225  197       Latest Ref Rng & Units 07/01/2023   11:04 AM 06/10/2023   12:25 PM 05/13/2023    9:33 AM  CMP  Glucose 70 - 99 mg/dL 92  95  98   BUN 8 - 23 mg/dL 17  17  15    Creatinine 0.44 - 1.00 mg/dL 4.09  8.11  9.14   Sodium 135 - 145 mmol/L 139  137  139   Potassium 3.5 - 5.1 mmol/L 3.9  3.7  3.5   Chloride 98 - 111 mmol/L 106  105  105   CO2 22 - 32 mmol/L 23  23  25    Calcium 8.9 - 10.3 mg/dL 78.2  9.7  9.8   Total Protein  6.5 - 8.1 g/dL 7.9  7.5  7.7   Total Bilirubin 0.3 - 1.2 mg/dL 0.9  0.5  1.0   Alkaline Phos 38 - 126 U/L 57  54  57   AST 15 - 41 U/L 55  43  43   ALT 0 - 44 U/L 75  59  59      No results found for: "CEA1", "CEA" / No results found for: "CEA1", "CEA" No results found for: "PSA1" No results found for: "NFA213" No results found for: "CAN125"  No results found for: "TOTALPROTELP", "ALBUMINELP", "A1GS", "A2GS", "BETS", "BETA2SER", "GAMS", "MSPIKE", "SPEI" No results found for: "TIBC", "FERRITIN", "IRONPCTSAT" No results found for: "LDH"   STUDIES:   NM PET Image Restag (PS) Skull Base To Thigh  Result Date: 07/22/2023 CLINICAL DATA:  Subsequent treatment strategy for LEFT breast carcinoma. EXAM: NUCLEAR MEDICINE PET SKULL BASE TO THIGH TECHNIQUE: 8.9 mCi F-18 FDG was injected intravenously. Full-ring PET imaging was performed from the skull base to thigh after the radiotracer. CT data was obtained and used for attenuation correction and anatomic localization. Fasting blood glucose: 95 mg/dl COMPARISON:  None Available. FINDINGS: Mediastinal blood pool activity: SUV max 2.5 Liver activity: SUV max NA NECK: No hypermetabolic nodes in the neck. Resolution of hypermetabolic LEFT supraclavicular adenopathy. Incidental CT findings: None. CHEST: No hypermetabolic mediastinal or hilar nodes. No suspicious pulmonary nodules on the CT scan. Incidental CT findings: Post mastectomy anatomy ABDOMEN/PELVIS: No abnormal hypermetabolic activity within the liver, pancreas, adrenal glands, or spleen. No hypermetabolic lymph nodes in the abdomen or pelvis. Incidental CT findings: Atherosclerotic calcification of the aorta. SKELETON: There is sclerotic lesions within the thoracic spine at T7-T8 T9. The T7 and T9 vertebral bodies have intense metabolic activity. For example the T9 vertebral body is almost entirely involved by hypermetabolic tissue SUV max equal 7.4. Fairly intense activity in the T 7 vertebral body.  No metabolic activity associated with sclerotic  lesion in the L4 vertebral body. The sclerotic lesions were present on comparison PET-CT however however the hypermetabolic activity is new ( PET-CT comparison 08/10/2021 ) Mild activity associated sclerotic lesion the posterior RIGHT acetabulum (image 224) Incidental CT findings: None. IMPRESSION: 1. Intense metabolic activity associated with sclerotic lesions in the T7 and T9 vertebral bodies consistent with active metastatic disease (breast cancer skeletal metastasis recurrence). 2. No metabolic activity associated with sclerotic lesion in the T8 or L4 vertebral body. 3. Mild activity associated with sclerotic lesion in the posterior RIGHT acetabulum. 4. No soft tissue metastasis.  No lymphadenopathy Electronically Signed   By: Genevive Bi M.D.   On: 07/22/2023 16:28

## 2023-07-22 NOTE — Telephone Encounter (Signed)
error 

## 2023-07-23 ENCOUNTER — Encounter: Payer: Self-pay | Admitting: Hematology

## 2023-07-23 ENCOUNTER — Inpatient Hospital Stay (HOSPITAL_BASED_OUTPATIENT_CLINIC_OR_DEPARTMENT_OTHER): Payer: 59 | Admitting: Hematology

## 2023-07-23 VITALS — BP 122/90 | HR 85 | Temp 97.9°F | Resp 18 | Wt 189.0 lb

## 2023-07-23 DIAGNOSIS — C50919 Malignant neoplasm of unspecified site of unspecified female breast: Secondary | ICD-10-CM

## 2023-07-23 DIAGNOSIS — G629 Polyneuropathy, unspecified: Secondary | ICD-10-CM | POA: Diagnosis not present

## 2023-07-23 DIAGNOSIS — G4700 Insomnia, unspecified: Secondary | ICD-10-CM | POA: Diagnosis not present

## 2023-07-23 DIAGNOSIS — Z170 Estrogen receptor positive status [ER+]: Secondary | ICD-10-CM | POA: Diagnosis not present

## 2023-07-23 DIAGNOSIS — M8580 Other specified disorders of bone density and structure, unspecified site: Secondary | ICD-10-CM | POA: Diagnosis not present

## 2023-07-23 DIAGNOSIS — Z79899 Other long term (current) drug therapy: Secondary | ICD-10-CM

## 2023-07-23 DIAGNOSIS — Z5111 Encounter for antineoplastic chemotherapy: Secondary | ICD-10-CM | POA: Diagnosis not present

## 2023-07-23 DIAGNOSIS — D72819 Decreased white blood cell count, unspecified: Secondary | ICD-10-CM | POA: Diagnosis not present

## 2023-07-23 DIAGNOSIS — Z7982 Long term (current) use of aspirin: Secondary | ICD-10-CM | POA: Diagnosis not present

## 2023-07-23 DIAGNOSIS — C7951 Secondary malignant neoplasm of bone: Secondary | ICD-10-CM | POA: Diagnosis not present

## 2023-07-23 DIAGNOSIS — I10 Essential (primary) hypertension: Secondary | ICD-10-CM | POA: Diagnosis not present

## 2023-07-23 DIAGNOSIS — Z803 Family history of malignant neoplasm of breast: Secondary | ICD-10-CM | POA: Diagnosis not present

## 2023-07-23 DIAGNOSIS — Z9221 Personal history of antineoplastic chemotherapy: Secondary | ICD-10-CM | POA: Diagnosis not present

## 2023-07-23 DIAGNOSIS — Z806 Family history of leukemia: Secondary | ICD-10-CM | POA: Diagnosis not present

## 2023-07-23 DIAGNOSIS — Z79818 Long term (current) use of other agents affecting estrogen receptors and estrogen levels: Secondary | ICD-10-CM | POA: Diagnosis not present

## 2023-07-23 DIAGNOSIS — C50912 Malignant neoplasm of unspecified site of left female breast: Secondary | ICD-10-CM | POA: Diagnosis not present

## 2023-07-23 DIAGNOSIS — Z9013 Acquired absence of bilateral breasts and nipples: Secondary | ICD-10-CM | POA: Diagnosis not present

## 2023-07-23 DIAGNOSIS — F17210 Nicotine dependence, cigarettes, uncomplicated: Secondary | ICD-10-CM | POA: Diagnosis not present

## 2023-07-23 DIAGNOSIS — Z801 Family history of malignant neoplasm of trachea, bronchus and lung: Secondary | ICD-10-CM | POA: Diagnosis not present

## 2023-07-23 DIAGNOSIS — R7401 Elevation of levels of liver transaminase levels: Secondary | ICD-10-CM | POA: Diagnosis not present

## 2023-07-23 NOTE — Patient Instructions (Addendum)
[facility] at [facility] Discharge Instructions   You were seen and examined today by Dr. Ellin Saba.  He reviewed the results of your PET scan. It is showing there are two new spots lighting up in your thoracic spine. We will need to change your treatment as you have had disease progression on the current regimen. He discussed changing treatment with 2 different options. One option is to keep receiving the Faslodex injections and change the pills to a pill called Kisqali. The other option is changing the pill to one called Lenvima (everolimus). This can cause elevated blood pressure, mouth sores, and can cause more side effects to what you are used to now.   You have chosen to continue Faslodex and switch pill to [employer].   He reviewed the results of the special blood test we sent (Guardant 360). It does not show any significant mutations.   We will see you back in 3 months. We will repeat a PET scan prior to your next visit.  Return as scheduled.     Thank you for choosing [facility] at [facility] to provide your oncology and hematology care.  To afford each patient quality time with our provider, please arrive at least 15 minutes before your scheduled appointment time.   If you have a lab appointment with the Cancer Center please come in thru the Main Entrance and check in at the main information desk.  You need to re-schedule your appointment should you arrive 10 or more minutes late.  We strive to give you quality time with our providers, and arriving late affects you and other patients whose appointments are after yours.  Also, if you no show three or more times for appointments you may be dismissed from the clinic at the providers discretion.     Again, thank you for choosing [facility].  Our hope is that these requests will decrease the amount of time that you wait before being seen by our physicians.        _____________________________________________________________  Should you have questions after your visit to [facility], please contact our office at [phone] and follow the prompts.  Our office hours are 8:00 a.m. and 4:30 p.m. Monday - Friday.  Please note that voicemails left after 4:00 p.m. may not be returned until the following business day.  We are closed weekends and major holidays.  You do have access to a nurse 24-7, just call the main number to the clinic [phone] and do not press any options, hold on the line and a nurse will answer the phone.    For prescription refill requests, have your pharmacy contact our office and allow 72 hours.    Due to Covid, you will need to wear a mask upon entering the hospital. If you do not have a mask, a mask will be given to you at the Main Entrance upon arrival. For doctor visits, patients may have 1 support person age 41 or older with them. For treatment visits, patients can not have anyone with them due to social distancing guidelines and our immunocompromised population.

## 2023-07-29 ENCOUNTER — Other Ambulatory Visit (HOSPITAL_COMMUNITY): Payer: Self-pay

## 2023-07-29 ENCOUNTER — Other Ambulatory Visit: Payer: Self-pay

## 2023-07-29 ENCOUNTER — Encounter: Payer: Self-pay | Admitting: Hematology

## 2023-07-29 MED ORDER — EXEMESTANE 25 MG PO TABS: 25.0000 mg | ORAL_TABLET | Freq: Every day | ORAL | 1 refills | Status: DC

## 2023-07-29 MED ORDER — RIBOCICLIB SUCC (600 MG DOSE) 200 MG PO TBPK
600.0000 mg | ORAL_TABLET | Freq: Every day | ORAL | 3 refills | Status: DC
  Filled 2023-07-29: qty 63, 21d supply, fill #0
  Filled 2023-07-31: qty 63, 28d supply, fill #0
  Filled 2023-08-21: qty 63, 28d supply, fill #1
  Filled 2023-09-15: qty 63, 28d supply, fill #2
  Filled 2023-10-14: qty 63, 28d supply, fill #3

## 2023-07-30 ENCOUNTER — Telehealth: Payer: Self-pay | Admitting: Pharmacist

## 2023-07-30 ENCOUNTER — Other Ambulatory Visit (HOSPITAL_COMMUNITY): Payer: Self-pay

## 2023-07-30 ENCOUNTER — Telehealth: Payer: Self-pay

## 2023-07-30 NOTE — Telephone Encounter (Signed)
Clinical Pharmacist Practitioner Encounter   Received new prescription for Kisqali (ribociclib) for the treatment of metastatic breast cancer, ER/PR positive, HER2 negative, planned duration until disease progression or unacceptable drug toxicity.  Patient will be transitioning from treatment with palbociclib based on information from the MAINTAIN TRIAL. Lind Covert, Accordino MK, Chiuzan C, et al. Randomized Phase II Trial of Endocrine Therapy With or Without Ribociclib After Progression on Cyclin-Dependent Kinase 4/6 Inhibition in Hormone Receptor-Positive, Human Epidermal Growth Factor Receptor 2-Negative Metastatic Breast Cancer: MAINTAIN Trial. J Clin Oncol. 2023;41(24):4004-4013. doi:10.1200/JCO.22.02392  CMP from 07/01/23 assessed, no relevant lab abnormalities. Prescription dose and frequency assessed. ECG on 07/23/23 showed QTc of 446 msec  Monitoring needed:  ECG: at day 14 of the first cycle, at the start of the second cycle, and as clinically indicated CBC/LFT: every 2 weeks for the first 2 cycles, at the start of each subsequent 4 cycles, and as clinically indicated  Current medication list in Epic reviewed, a few DDIs with ribociclib identified: Amlodipine: ribociclib may increase the serum concentration of amlodipine. Monitor for increased amlodipine effects and toxicities (eg, hypotension, edema)  Hydrocodone: ribociclib may increase the serum concentration of hydrocodone. Monitor patients for increased hydrocodone toxicities.   Evaluated chart and no patient barriers to medication adherence identified.   Prescription has been e-scribed to the [facility] for benefits analysis and approval.  Oral Oncology Clinic will continue to follow for insurance authorization, copayment issues, initial counseling and start date.   [name], PharmD, BCPS, BCOP, CPP Hematology/Oncology Clinical Pharmacist Practitioner [place] [phone]  07/30/2023 1:16 PM

## 2023-07-30 NOTE — Telephone Encounter (Signed)
Oral Oncology Patient Advocate Encounter  New authorization   Received notification that prior authorization for Wendy Zamora is required.   PA submitted on 07/30/23  Key [id number]  Status is pending     Ardeen Fillers, CPhT Oncology Pharmacy Patient Advocate  [facility]  [phone] (phone) [phone] (fax) 07/30/2023 1:05 PM

## 2023-07-31 ENCOUNTER — Encounter: Payer: Self-pay | Admitting: Hematology

## 2023-07-31 ENCOUNTER — Other Ambulatory Visit (HOSPITAL_COMMUNITY): Payer: Self-pay

## 2023-07-31 ENCOUNTER — Other Ambulatory Visit: Payer: Self-pay

## 2023-07-31 NOTE — Telephone Encounter (Signed)
Patient successfully OnBoarded and drug education provided by pharmacist. Medication scheduled to be shipped on 08/04/23 for delivery on 08/05/23 from [facility] to patient's address. Patient also knows to call me at [phone] with any questions or concerns regarding receiving medication or if there is any unexpected change in co-pay.    Ardeen Fillers, CPhT Oncology Pharmacy Patient Advocate  [facility]  [phone] (phone) [phone] (fax) 07/31/2023 2:58 PM

## 2023-07-31 NOTE — Telephone Encounter (Signed)
Clinical Pharmacist Practitioner Encounter   [facility] (Special[facility]) will deliver medication to patient on 08/05/23. Patient will start taking her ribociclib on 08/06/23.  Patient Education I spoke with patient for overview of new oral chemotherapy medication: Kisqali (ribociclib) for the treatment of metastatic breast cancer, ER/PR positive, HER2 negative, planned duration until disease progression or unacceptable drug toxicity.   Counseled patient on administration, dosing, side effects, monitoring, drug-food interactions, safe handling, storage, and disposal. Patient will take 3 tablets (600 mg total) by mouth daily. Take for 21 days on, 7 days off, repeat every 28 days.  Side effects include but not limited to: nausea, fatigue, decreased wbc/hgb/pltc, diarrhea.    Reviewed with patient importance of keeping a medication schedule and plan for any missed doses.  After discussion with patient no patient barriers to medication adherence identified.   Wendy Zamora voiced understanding and appreciation. All questions answered. Medication handout provided.  Provided patient with Oral Chemotherapy Navigation Clinic phone number. Patient knows to call the office with questions or concerns. Oral Chemotherapy Navigation Clinic will continue to follow.  [name], PharmD, BCPS, BCOP, CPP Hematology/Oncology Clinical Pharmacist Practitioner [place] [phone]  07/31/2023 2:43 PM

## 2023-07-31 NOTE — Telephone Encounter (Addendum)
Oral Oncology Patient Advocate Encounter  Prior Authorization for Idelle Jo has been approved.    PA# [id number]  Effective dates: 07/30/23 through 07/29/24  Patient has [place] Medicaid secondary so co-pay is $4.00.    Ardeen Fillers, CPhT Oncology Pharmacy Patient Advocate  [facility]  [phone] (phone) [phone] (fax) 07/31/2023 8:01 AM

## 2023-08-01 ENCOUNTER — Other Ambulatory Visit (HOSPITAL_COMMUNITY): Payer: Self-pay

## 2023-08-01 ENCOUNTER — Encounter: Payer: Self-pay | Admitting: Hematology

## 2023-08-01 NOTE — Telephone Encounter (Signed)
Confirmed with Dr. Ellin Saba that patient will stop fulvestrant inj, she will begin taking exemestane. Called patient on 08/01/23 to reviewed this change.  She stated her understanding of the plan.

## 2023-08-01 NOTE — Addendum Note (Signed)
Addended by: [name] on: 08/01/2023 09:40 AM   Modules accepted: Orders

## 2023-08-04 ENCOUNTER — Other Ambulatory Visit (HOSPITAL_COMMUNITY): Payer: Self-pay

## 2023-08-04 ENCOUNTER — Other Ambulatory Visit: Payer: Self-pay

## 2023-08-05 ENCOUNTER — Inpatient Hospital Stay: Payer: 59

## 2023-08-05 ENCOUNTER — Other Ambulatory Visit: Payer: Self-pay | Admitting: *Deleted

## 2023-08-05 ENCOUNTER — Inpatient Hospital Stay: Payer: 59 | Attending: Hematology

## 2023-08-05 ENCOUNTER — Other Ambulatory Visit: Payer: Self-pay

## 2023-08-05 VITALS — BP 133/71 | HR 80 | Temp 97.2°F | Resp 20

## 2023-08-05 DIAGNOSIS — Z170 Estrogen receptor positive status [ER+]: Secondary | ICD-10-CM | POA: Insufficient documentation

## 2023-08-05 DIAGNOSIS — C50919 Malignant neoplasm of unspecified site of unspecified female breast: Secondary | ICD-10-CM

## 2023-08-05 DIAGNOSIS — C7951 Secondary malignant neoplasm of bone: Secondary | ICD-10-CM | POA: Diagnosis not present

## 2023-08-05 DIAGNOSIS — C50912 Malignant neoplasm of unspecified site of left female breast: Secondary | ICD-10-CM | POA: Insufficient documentation

## 2023-08-05 LAB — CBC WITH DIFFERENTIAL/PLATELET
Abs Immature Granulocytes: 0 10*3/uL (ref 0.00–0.07)
Basophils Absolute: 0 10*3/uL (ref 0.0–0.1)
Basophils Relative: 0 %
Eosinophils Absolute: 0 10*3/uL (ref 0.0–0.5)
Eosinophils Relative: 0 %
HCT: 36.5 % (ref 36.0–46.0)
Hemoglobin: 12.9 g/dL (ref 12.0–15.0)
Lymphocytes Relative: 49 %
Lymphs Abs: 1.5 10*3/uL (ref 0.7–4.0)
MCH: 36.4 pg — ABNORMAL HIGH (ref 26.0–34.0)
MCHC: 35.3 g/dL (ref 30.0–36.0)
MCV: 103.1 fL — ABNORMAL HIGH (ref 80.0–100.0)
Monocytes Absolute: 0.2 10*3/uL (ref 0.1–1.0)
Monocytes Relative: 8 %
Neutro Abs: 1.3 10*3/uL — ABNORMAL LOW (ref 1.7–7.7)
Neutrophils Relative %: 43 %
Platelets: 173 10*3/uL (ref 150–400)
RBC: 3.54 MIL/uL — ABNORMAL LOW (ref 3.87–5.11)
RDW: 12.9 % (ref 11.5–15.5)
WBC: 3.1 10*3/uL — ABNORMAL LOW (ref 4.0–10.5)
nRBC: 0 % (ref 0.0–0.2)

## 2023-08-05 LAB — COMPREHENSIVE METABOLIC PANEL
ALT: 53 U/L — ABNORMAL HIGH (ref 0–44)
AST: 39 U/L (ref 15–41)
Albumin: 3.8 g/dL (ref 3.5–5.0)
Alkaline Phosphatase: 51 U/L (ref 38–126)
Anion gap: 10 (ref 5–15)
BUN: 14 mg/dL (ref 8–23)
CO2: 22 mmol/L (ref 22–32)
Calcium: 9.2 mg/dL (ref 8.9–10.3)
Chloride: 107 mmol/L (ref 98–111)
Creatinine, Ser: 0.8 mg/dL (ref 0.44–1.00)
GFR, Estimated: >60 mL/min (ref >60)
Glucose, Bld: 107 mg/dL — ABNORMAL HIGH (ref 70–99)
Potassium: 3.6 mmol/L (ref 3.5–5.1)
Sodium: 139 mmol/L (ref 135–145)
Total Bilirubin: 0.5 mg/dL (ref 0.3–1.2)
Total Protein: 6.7 g/dL (ref 6.5–8.1)

## 2023-08-05 LAB — MAGNESIUM: Magnesium: 1.9 mg/dL (ref 1.7–2.4)

## 2023-08-05 MED ORDER — DIPHENHYDRAMINE HCL 12.5 MG/5ML PO LIQD: 10.0000 mL | Freq: Two times a day (BID) | ORAL | 2 refills | Status: DC

## 2023-08-05 MED ORDER — DENOSUMAB 120 MG/1.7ML ~~LOC~~ SOLN
120.0000 mg | Freq: Once | SUBCUTANEOUS | Status: AC
  Administered 2023-08-05: 120 mg via SUBCUTANEOUS
  Filled 2023-08-05: qty 1.7

## 2023-08-05 NOTE — Progress Notes (Signed)
Patient presents today for Xgeva injection per providers order.  Vital signs WNL.  Calcium 9.2.  Patient has been taking calcium and vitamin D supplements, has had no prior or upcoming dental work, and no jaw pain.  Xgeva administration without incident; injection site WNL; see MAR for injection details.  Patient tolerated procedure well and without incident.  No questions or complaints noted at this time.

## 2023-08-05 NOTE — Patient Instructions (Signed)
[facility]  Discharge Instructions: Thank you for choosing [facility] to provide your oncology and hematology care.  If you have a lab appointment with the [facility] - please note that after April 8th, 2024, all labs will be drawn in the cancer center.  You do not have to check in or register with the main entrance as you have in the past but will complete your check-in in the cancer center.  Wear comfortable clothing and clothing appropriate for easy access to any Portacath or PICC line.   We strive to give you quality time with your provider. You may need to reschedule your appointment if you arrive late (15 or more minutes).  Arriving late affects you and other patients whose appointments are after yours.  Also, if you miss three or more appointments without notifying the office, you may be dismissed from the clinic at the provider's discretion.      For prescription refill requests, have your pharmacy contact our office and allow 72 hours for refills to be completed.    Today you received the following chemotherapy and/or immunotherapy agents Xgeva      To help prevent nausea and vomiting after your treatment, we encourage you to take your nausea medication as directed.  BELOW ARE SYMPTOMS THAT SHOULD BE REPORTED IMMEDIATELY: *FEVER GREATER THAN 100.4 F (38 C) OR HIGHER *CHILLS OR SWEATING *NAUSEA AND VOMITING THAT IS NOT CONTROLLED WITH YOUR NAUSEA MEDICATION *UNUSUAL SHORTNESS OF BREATH *UNUSUAL BRUISING OR BLEEDING *URINARY PROBLEMS (pain or burning when urinating, or frequent urination) *BOWEL PROBLEMS (unusual diarrhea, constipation, pain near the anus) TENDERNESS IN MOUTH AND THROAT WITH OR WITHOUT PRESENCE OF ULCERS (sore throat, sores in mouth, or a toothache) UNUSUAL RASH, SWELLING OR PAIN  UNUSUAL VAGINAL DISCHARGE OR ITCHING   Items with * indicate a potential emergency and should be followed up as soon as possible or go to the  Emergency Department if any problems should occur.  Please show the CHEMOTHERAPY ALERT CARD or IMMUNOTHERAPY ALERT CARD at check-in to the Emergency Department and triage nurse.  Should you have questions after your visit or need to cancel or reschedule your appointment, please contact [facility] [phone]  and follow the prompts.  Office hours are 8:00 a.m. to 4:30 p.m. Monday - Friday. Please note that voicemails left after 4:00 p.m. may not be returned until the following business day.  We are closed weekends and major holidays. You have access to a nurse at all times for urgent questions. Please call the main number to the clinic [phone] and follow the prompts.  For any non-urgent questions, you may also contact your provider using MyChart. We now offer e-Visits for anyone 67 and older to request care online for non-urgent symptoms. For details visit [email or web address].   Also download the MyChart app! Go to the app store, search "MyChart", open the app, select [facility], and log in with your MyChart username and password.

## 2023-08-14 ENCOUNTER — Emergency Department (HOSPITAL_COMMUNITY)
Admission: EM | Admit: 2023-08-14 | Discharge: 2023-08-14 | Disposition: A | Discharge status: Home / Self Care | Payer: 59 | Attending: Emergency Medicine | Admitting: Emergency Medicine

## 2023-08-14 ENCOUNTER — Other Ambulatory Visit: Payer: Self-pay

## 2023-08-14 ENCOUNTER — Telehealth: Payer: Self-pay | Admitting: *Deleted

## 2023-08-14 ENCOUNTER — Emergency Department (HOSPITAL_COMMUNITY): Payer: 59

## 2023-08-14 DIAGNOSIS — Z79899 Other long term (current) drug therapy: Secondary | ICD-10-CM | POA: Insufficient documentation

## 2023-08-14 DIAGNOSIS — Z20822 Contact with and (suspected) exposure to covid-19: Secondary | ICD-10-CM | POA: Diagnosis not present

## 2023-08-14 DIAGNOSIS — R059 Cough, unspecified: Secondary | ICD-10-CM | POA: Insufficient documentation

## 2023-08-14 DIAGNOSIS — R0789 Other chest pain: Secondary | ICD-10-CM | POA: Diagnosis not present

## 2023-08-14 DIAGNOSIS — Z7982 Long term (current) use of aspirin: Secondary | ICD-10-CM | POA: Insufficient documentation

## 2023-08-14 DIAGNOSIS — I10 Essential (primary) hypertension: Secondary | ICD-10-CM | POA: Insufficient documentation

## 2023-08-14 DIAGNOSIS — Z91040 Latex allergy status: Secondary | ICD-10-CM | POA: Diagnosis not present

## 2023-08-14 DIAGNOSIS — C50912 Malignant neoplasm of unspecified site of left female breast: Secondary | ICD-10-CM | POA: Diagnosis not present

## 2023-08-14 DIAGNOSIS — R6883 Chills (without fever): Secondary | ICD-10-CM | POA: Diagnosis not present

## 2023-08-14 DIAGNOSIS — R079 Chest pain, unspecified: Secondary | ICD-10-CM

## 2023-08-14 DIAGNOSIS — Z853 Personal history of malignant neoplasm of breast: Secondary | ICD-10-CM | POA: Insufficient documentation

## 2023-08-14 DIAGNOSIS — R0602 Shortness of breath: Secondary | ICD-10-CM | POA: Insufficient documentation

## 2023-08-14 LAB — COMPREHENSIVE METABOLIC PANEL
ALT: 48 U/L — ABNORMAL HIGH (ref 0–44)
AST: 36 U/L (ref 15–41)
Albumin: 4 g/dL (ref 3.5–5.0)
Alkaline Phosphatase: 61 U/L (ref 38–126)
Anion gap: 11 (ref 5–15)
BUN: 12 mg/dL (ref 8–23)
CO2: 22 mmol/L (ref 22–32)
Calcium: 10.1 mg/dL (ref 8.9–10.3)
Chloride: 105 mmol/L (ref 98–111)
Creatinine, Ser: 0.98 mg/dL (ref 0.44–1.00)
GFR, Estimated: >60 mL/min (ref >60)
Glucose, Bld: 116 mg/dL — ABNORMAL HIGH (ref 70–99)
Potassium: 3.6 mmol/L (ref 3.5–5.1)
Sodium: 138 mmol/L (ref 135–145)
Total Bilirubin: 0.8 mg/dL (ref 0.3–1.2)
Total Protein: 7.5 g/dL (ref 6.5–8.1)

## 2023-08-14 LAB — SARS CORONAVIRUS 2 BY RT PCR: SARS Coronavirus 2 by RT PCR: NEGATIVE

## 2023-08-14 LAB — CBC
HCT: 37.2 % (ref 36.0–46.0)
Hemoglobin: 13 g/dL (ref 12.0–15.0)
MCH: 36.4 pg — ABNORMAL HIGH (ref 26.0–34.0)
MCHC: 34.9 g/dL (ref 30.0–36.0)
MCV: 104.2 fL — ABNORMAL HIGH (ref 80.0–100.0)
Platelets: 365 10*3/uL (ref 150–400)
RBC: 3.57 MIL/uL — ABNORMAL LOW (ref 3.87–5.11)
RDW: 13 % (ref 11.5–15.5)
WBC: 3.8 10*3/uL — ABNORMAL LOW (ref 4.0–10.5)
nRBC: 0 % (ref 0.0–0.2)

## 2023-08-14 LAB — D-DIMER, QUANTITATIVE: D-Dimer, Quant: 0.57 ug{FEU}/mL — ABNORMAL HIGH (ref 0.00–0.50)

## 2023-08-14 LAB — TROPONIN I (HIGH SENSITIVITY)
Troponin I (High Sensitivity): 4 ng/L (ref <18)
Troponin I (High Sensitivity): 5 ng/L (ref <18)

## 2023-08-14 MED ORDER — ASPIRIN 81 MG PO CHEW
324.0000 mg | CHEWABLE_TABLET | Freq: Once | ORAL | Status: AC
  Administered 2023-08-14: 324 mg via ORAL
  Filled 2023-08-14: qty 4

## 2023-08-14 NOTE — ED Triage Notes (Signed)
Pt presents with anterior CP tightness that started on Monday, currently receiving oral Chemo medication for bone cancer, per pt started new Chemo med on Thursday.

## 2023-08-14 NOTE — ED Notes (Signed)
Dc instructions reviewed with pt no questions or conerns at this time decline wheelchair. Ambulated out of ED with steady gait

## 2023-08-14 NOTE — ED Provider Notes (Signed)
[name] EMERGENCY DEPARTMENT AT [facility] Provider Note   CSN: [id number] Arrival date & time: 08/14/23  1052     History  Chief Complaint  Patient presents with   Chest Pain    Wendy Zamora is a 62 y.o. female.  HPI 62 year old female with a history of breast cancer currently being treated as well as left arm lymphedema, hypertension, GERD, presents with chest tightness.  She has been having chest tightness starting 3 days ago.  She is also felt a little more short of breath than her baseline dyspnea which has been ongoing for multiple years.  She started exemestane last week and wonders if this is contributing.  She feels like if she lays on her right side or when she walks she will notice a little bit of a worsening tightness.  Right now is about a 5 out of 10.  She has had some chills and maybe a little bit of a different cough.  She has chronic ankle swelling but no new or worsening swelling. The tightness is constant.  Home Medications Prior to Admission medications   Medication Sig Start Date End Date Taking? Authorizing Provider  amLODipine (NORVASC) 5 MG tablet Take 1 tablet (5 mg total) by mouth daily. 05/29/23  Yes Billie Lade, MD  Ascorbic Acid (VITAMIN C) 1000 MG tablet Take 1,000 mg by mouth daily.   Yes [provider]  aspirin 81 MG EC tablet Take 81 mg by mouth daily.   Yes [provider]  butalbital-acetaminophen-caffeine (FIORICET) 50-325-40 MG tablet Take 1 tablet by mouth every 4 (four) hours as needed for headache. 04/18/22  Yes Paseda, Baird Kay, FNP  Calcium-Magnesium-Vitamin D (CALCIUM 1200+D3 PO) Take 1 tablet by mouth daily.   Yes [provider]  cetirizine-pseudoephedrine (ZYRTEC-D) 5-120 MG tablet Take 1 tablet by mouth daily.   Yes [provider]  CINNAMON PO Take 1,000 mg by mouth daily.   Yes [provider]  cyanocobalamin 1000 MCG tablet Take 1 tablet (1,000 mcg total) by mouth  daily. Patient taking differently: Take 1,000 mcg by mouth every other day. 09/13/21  Yes [name], MD  cyclobenzaprine (FLEXERIL) 10 MG tablet Take 1 tablet (10 mg total) by mouth 2 (two) times daily as needed for muscle spasms. 01/14/23  Yes [name], MD  denosumab (XGEVA) 120 MG/1.7ML SOLN injection Inject 120 mg into the skin every 30 (thirty) days.   Yes [provider]  dexamethasone (DECADRON) 0.5 MG/5ML solution Take 0.5 mg by mouth daily. 08/05/23  Yes [provider]  dexamethasone 0.5 mg/5 mL - diphenhydrAMINE 12.5 mg/5 mL oral solution 2:1 mixture Take 10 mLs by mouth 2 (two) times daily. 08/05/23  Yes [name], MD  exemestane (AROMASIN) 25 MG tablet Take 1 tablet (25 mg total) by mouth daily after breakfast. 07/29/23  Yes [name], MD  HYDROcodone-acetaminophen (NORCO) 5-325 MG tablet Take 1 tablet by mouth every 12 (twelve) hours as needed for moderate pain. 07/10/23  Yes [name], MD  lansoprazole (PREVACID) 30 MG capsule TAKE 1 CAPSULE BY MOUTH IN THE MORNING AND AT BEDTIME 07/15/23  Yes Carlan, Chelsea L, NP  NAPHCON-A 0.025-0.3 % ophthalmic solution Place 2 drops into both eyes in the morning and at bedtime. 04/25/21  Yes [provider]  ribociclib succ (KISQALI, 600 MG DOSE,) 200 MG Therapy Pack Take 3 tablets (600 mg total) by mouth daily. Take for 21 days on, 7 days off, repeat every 28 days. 07/29/23  Yes [name], MD  Misc. Devices MISC Please provide patient with left arm compression sleeve. Diagnosis metastatic breast cancer, bilateral mastectomy, left arm lymphedema 04/30/22   [name], MD      Allergies    Augmentin [amoxicillin-pot clavulanate], Ciprofloxacin, Claritin [loratadine], Gabapentin, Keflex [cephalexin], Levofloxacin, Lisinopril, Lisinopril, Metoprolol, Metoprolol, Nexium [esomeprazole], Sulfa antibiotics, Tetracyclines & related, Iohexol, Ultram [tramadol], Ceftin  [cefuroxime axetil], Chromium, Iodinated contrast media, Misc. sulfonamide containing compounds, Oxycodone, Sulfa antibiotics, Latex, and Nickel    Review of Systems   Review of Systems  Constitutional:  Positive for chills. Negative for fever.  Respiratory:  Positive for chest tightness and shortness of breath.   Gastrointestinal:  Negative for abdominal pain.    Physical Exam Updated Vital Signs BP (!) 150/84   Pulse 75   Temp 98 F (36.7 C)   Resp 16   Ht 5' 0.05" (1.525 m)   Wt 81.6 kg   LMP 06/08/2012   SpO2 96%   BMI 35.10 kg/m  Physical Exam Vitals and nursing note reviewed.  Constitutional:      General: She is not in acute distress.    Appearance: She is well-developed. She is not ill-appearing.  HENT:     Head: Normocephalic and atraumatic.  Cardiovascular:     Rate and Rhythm: Normal rate and regular rhythm.     Heart sounds: Normal heart sounds.  Pulmonary:     Effort: Pulmonary effort is normal.     Breath sounds: Normal breath sounds.  Abdominal:     Palpations: Abdomen is soft.     Tenderness: There is no abdominal tenderness.  Musculoskeletal:     Comments: Mild ankle swelling, chronic per patient.  Skin:    General: Skin is warm and dry.  Neurological:     Mental Status: She is alert.     ED Results / Procedures / Treatments   Labs (all labs ordered are listed, but only abnormal results are displayed) Labs Reviewed  CBC - Abnormal; Notable for the following components:      Result Value   WBC 3.8 (*)    RBC 3.57 (*)    MCV 104.2 (*)    MCH 36.4 (*)    All other components within normal limits  D-DIMER, QUANTITATIVE - Abnormal; Notable for the following components:   D-Dimer, Quant 0.57 (*)    All other components within normal limits  COMPREHENSIVE METABOLIC PANEL - Abnormal; Notable for the following components:   Glucose, Bld 116 (*)    ALT 48 (*)    All other components within normal limits  SARS CORONAVIRUS 2 BY RT PCR  TROPONIN I  (HIGH SENSITIVITY)  TROPONIN I (HIGH SENSITIVITY)    EKG EKG Interpretation Date/Time:  Thursday August 14 2023 11:16:26 EDT Ventricular Rate:  87 PR Interval:  150 QRS Duration:  94 QT Interval:  375 QTC Calculation: 452 R Axis:   20  Text Interpretation: Sinus rhythm RSR' in V1 or V2, right VCD or RVH Nonspecific T abnrm, anterolateral leads T wave changes similar to July 2024 Confirmed by Pricilla Loveless [phone]) on 08/14/2023 11:18:13 AM  Radiology DG Chest Portable 1 View  Result Date: 08/14/2023 CLINICAL DATA:  Chest pain.  Left breast carcinoma EXAM: PORTABLE CHEST 1 VIEW COMPARISON:  CT 04/07/2023 and older.  PET-CT 07/17/2023 FINDINGS: No consolidation, pneumothorax or effusion. No edema. Normal cardiopericardial silhouette. Overlapping cardiac leads. IMPRESSION: No acute cardiopulmonary disease. Electronically Signed   By: Karen Kays M.D.   On:  08/14/2023 12:59    Procedures Procedures    Medications Ordered in ED Medications  aspirin chewable tablet 324 mg (324 mg Oral Given 08/14/23 1147)    ED Course/ Medical Decision Making/ A&P                                 Medical Decision Making Amount and/or Complexity of Data Reviewed Labs: ordered.    Details: Troponin is normal x 2.  D-dimer negative when age-adjusted.  COVID test negative. Radiology: ordered and independent interpretation performed.    Details: No pneumonia ECG/medicine tests: ordered and independent interpretation performed.    Details: Nonspecific T waves, unchanged  Risk OTC drugs.   Patient presents with atypical chest tightness for several days.  She is concerned it might be from her medication.  She does have a history of cancer but her history would be atypical for PE.  It is very positional.  D-dimer is age-adjusted negative.  Troponin is negative x 2.  No obvious infection such as COVID or pneumonia.  At this point, symptoms are essentially unchanged and she would like to go home which I  think is reasonable.  I have low suspicion for cardiac emergency.  Doubt PE or dissection.  Will discharge home and have her follow-up with her.        Final Clinical Impression(s) / ED Diagnoses Final diagnoses:  Nonspecific chest pain    Rx / DC Orders ED Discharge Orders     None         Pricilla Loveless, MD 08/14/23 1513

## 2023-08-14 NOTE — Telephone Encounter (Signed)
Patient called and has been having chest tightness and a "strange" sensation between chest and back.  Started on exemestane Thursday August 8 th.  Reports SOB, however states that it is not worse than her baseline.  Advised not to take today's dose and report to the ER for evaluation.

## 2023-08-14 NOTE — Discharge Instructions (Signed)
If you develop recurrent, continued, or worsening chest pain, shortness of breath, fever, vomiting, abdominal or back pain, or any other new/concerning symptoms then return to the ER for evaluation.  

## 2023-08-15 ENCOUNTER — Telehealth: Payer: Self-pay

## 2023-08-15 NOTE — Telephone Encounter (Signed)
Message from [facility] stating patient called and has been having chest tightness and a "strange" sensation between chest and back. Started on exemestane Thursday August 8 th. Reports SOB, however states that it is not worse than her baseline. Sent her to ER and she was discharged home. What are your thoughts?   Dr. Elbert Ewings replied to continue medication.  Patient called today and made aware to continue medication.

## 2023-08-20 ENCOUNTER — Other Ambulatory Visit (HOSPITAL_COMMUNITY): Payer: Self-pay

## 2023-08-21 ENCOUNTER — Other Ambulatory Visit (HOSPITAL_COMMUNITY): Payer: Self-pay

## 2023-08-21 ENCOUNTER — Other Ambulatory Visit: Payer: Self-pay | Admitting: *Deleted

## 2023-08-21 ENCOUNTER — Encounter: Payer: Self-pay | Admitting: Hematology

## 2023-08-21 ENCOUNTER — Telehealth: Payer: Self-pay | Admitting: *Deleted

## 2023-08-21 DIAGNOSIS — Z5181 Encounter for therapeutic drug level monitoring: Secondary | ICD-10-CM

## 2023-08-21 MED ORDER — FUROSEMIDE 20 MG PO TABS: 20.0000 mg | ORAL_TABLET | Freq: Every day | ORAL | 0 refills | Status: DC | PRN

## 2023-08-21 NOTE — Telephone Encounter (Signed)
Patent called to advise that she started [facility] on 8/8 and began having significant ankle and foot swelling, associated with dark urine as of yesterday.  Per Dr. Ellin Saba, appointments made for labs, EKG and follow up with him on Monday 8/26.  Will send in Lasix 20 mg prn for edema.  Patient aware and verbalized understanding.

## 2023-08-25 ENCOUNTER — Inpatient Hospital Stay (HOSPITAL_BASED_OUTPATIENT_CLINIC_OR_DEPARTMENT_OTHER): Payer: 59 | Admitting: Hematology

## 2023-08-25 ENCOUNTER — Encounter: Payer: Self-pay | Admitting: Hematology

## 2023-08-25 ENCOUNTER — Other Ambulatory Visit: Payer: Self-pay

## 2023-08-25 ENCOUNTER — Other Ambulatory Visit (HOSPITAL_COMMUNITY): Payer: Self-pay

## 2023-08-25 ENCOUNTER — Inpatient Hospital Stay: Payer: 59

## 2023-08-25 VITALS — BP 134/77 | HR 82 | Temp 97.9°F | Resp 16 | Wt 186.6 lb

## 2023-08-25 DIAGNOSIS — C50912 Malignant neoplasm of unspecified site of left female breast: Secondary | ICD-10-CM | POA: Diagnosis not present

## 2023-08-25 DIAGNOSIS — C50919 Malignant neoplasm of unspecified site of unspecified female breast: Secondary | ICD-10-CM

## 2023-08-25 DIAGNOSIS — Z170 Estrogen receptor positive status [ER+]: Secondary | ICD-10-CM | POA: Diagnosis not present

## 2023-08-25 DIAGNOSIS — C7951 Secondary malignant neoplasm of bone: Secondary | ICD-10-CM | POA: Diagnosis not present

## 2023-08-25 LAB — CBC WITH DIFFERENTIAL/PLATELET
Abs Immature Granulocytes: 0 10*3/uL (ref 0.00–0.07)
Basophils Absolute: 0.1 10*3/uL (ref 0.0–0.1)
Basophils Relative: 2 %
Eosinophils Absolute: 0 10*3/uL (ref 0.0–0.5)
Eosinophils Relative: 1 %
HCT: 41 % (ref 36.0–46.0)
Hemoglobin: 13.9 g/dL (ref 12.0–15.0)
Lymphocytes Relative: 42 %
Lymphs Abs: 1.1 10*3/uL (ref 0.7–4.0)
MCH: 36.5 pg — ABNORMAL HIGH (ref 26.0–34.0)
MCHC: 33.9 g/dL (ref 30.0–36.0)
MCV: 107.6 fL — ABNORMAL HIGH (ref 80.0–100.0)
Monocytes Absolute: 0.1 10*3/uL (ref 0.1–1.0)
Monocytes Relative: 3 %
Neutro Abs: 1.4 10*3/uL — ABNORMAL LOW (ref 1.7–7.7)
Neutrophils Relative %: 52 %
Platelets: 246 10*3/uL (ref 150–400)
RBC: 3.81 MIL/uL — ABNORMAL LOW (ref 3.87–5.11)
RDW: 13.4 % (ref 11.5–15.5)
WBC: 2.6 10*3/uL — ABNORMAL LOW (ref 4.0–10.5)
nRBC: 0 % (ref 0.0–0.2)

## 2023-08-25 LAB — COMPREHENSIVE METABOLIC PANEL
ALT: 44 U/L (ref 0–44)
AST: 32 U/L (ref 15–41)
Albumin: 4.3 g/dL (ref 3.5–5.0)
Alkaline Phosphatase: 60 U/L (ref 38–126)
Anion gap: 10 (ref 5–15)
BUN: 15 mg/dL (ref 8–23)
CO2: 22 mmol/L (ref 22–32)
Calcium: 9.6 mg/dL (ref 8.9–10.3)
Chloride: 105 mmol/L (ref 98–111)
Creatinine, Ser: 1.02 mg/dL — ABNORMAL HIGH (ref 0.44–1.00)
GFR, Estimated: >60 mL/min (ref >60)
Glucose, Bld: 99 mg/dL (ref 70–99)
Potassium: 4.1 mmol/L (ref 3.5–5.1)
Sodium: 137 mmol/L (ref 135–145)
Total Bilirubin: 0.8 mg/dL (ref 0.3–1.2)
Total Protein: 7.8 g/dL (ref 6.5–8.1)

## 2023-08-25 LAB — MAGNESIUM: Magnesium: 2.3 mg/dL (ref 1.7–2.4)

## 2023-08-25 NOTE — Progress Notes (Signed)
Patient is taking Kisqali as prescribed.  She has not missed any doses and reports no side effects at this time.

## 2023-08-25 NOTE — Patient Instructions (Signed)
[facility] at [facility] Discharge Instructions   You were seen and examined today by Dr. Ellin Saba.  He reviewed the results of your lab work which are mostly normal/stable.   He reviewed your EKG which was normal.   We will see you back in 2 weeks. We will repeat lab work and an EKG at that time.   Return as scheduled.    Thank you for choosing  Cancer Center at [facility] to provide your oncology and hematology care.  To afford each patient quality time with our provider, please arrive at least 15 minutes before your scheduled appointment time.   If you have a lab appointment with the Cancer Center please come in thru the Main Entrance and check in at the main information desk.  You need to re-schedule your appointment should you arrive 10 or more minutes late.  We strive to give you quality time with our providers, and arriving late affects you and other patients whose appointments are after yours.  Also, if you no show three or more times for appointments you may be dismissed from the clinic at the providers discretion.     Again, thank you for choosing [facility].  Our hope is that these requests will decrease the amount of time that you wait before being seen by our physicians.       _____________________________________________________________  Should you have questions after your visit to [facility], please contact our office at [phone] and follow the prompts.  Our office hours are 8:00 a.m. and 4:30 p.m. Monday - Friday.  Please note that voicemails left after 4:00 p.m. may not be returned until the following business day.  We are closed weekends and major holidays.  You do have access to a nurse 24-7, just call the main number to the clinic [phone] and do not press any options, hold on the line and a nurse will answer the phone.    For prescription refill requests, have your pharmacy contact our  office and allow 72 hours.    Due to Covid, you will need to wear a mask upon entering the hospital. If you do not have a mask, a mask will be given to you at the Main Entrance upon arrival. For doctor visits, patients may have 1 support person age 78 or older with them. For treatment visits, patients can not have anyone with them due to social distancing guidelines and our immunocompromised population.

## 2023-08-25 NOTE — Progress Notes (Signed)
[facility] [address], [place] [address]    Clinic Day:  08/25/2023  Referring physician: Billie Lade, MD  Patient Care Team: Billie Lade, MD as PCP - General (Internal Medicine) [name], RN as Oncology Nurse Navigator (Oncology) [name], MD as Medical Oncologist (Medical Oncology)   ASSESSMENT & PLAN:   Assessment: 1.  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 - Started on Ibrance and Faslodex in December 2021 for metastatic disease. - PET scan on 08/10/2021 with no evidence of hypermetabolic metastatic disease.  Widespread scattered sclerotic bone metastasis in the spine and pelvis are stable and not metabolically active.  No lymphadenopathy.  Asymmetric muscular uptake in the left lower scalene muscles and in the intercostal muscles of the ventral upper left chest wall without discrete mass correlate on the CT images, favoring activity related uptake. [name] was dose reduced to 100 mg 3 weeks on/1 week off on 10/11/2021 due to fatigue.  Discontinued on 07/23/2023 due to progression - Ribociclib and exemestane started on 08/07/2023  2.  Social/family history: - She is a retired Engineer, structural.  She recently moved back from [place]. - Younger sister had breast cancer in her 4s.  Mother died of leukemia at age 80.  Paternal cousin also had breast cancer.  3.  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  She underwent immediate implants which were later removed on 12/10/2019 secondary to infection and necrosis. - XRT to the chest wall and regional lymph nodes, 60 Gray, from 02/29/2020 through 04/12/2020 - She was treated with adjuvant anastrozole which was started 12/09/2019 and was found to have metastatic disease in November 2021. - Previous genetic testing was reportedly negative and [place].    Plan: 1.  Metastatic breast cancer to the bones, ER/PR positive, HER2 negative: - PET scan on 07/17/2023: Intense activity in the T7 and T9 vertebral bodies.  No metabolic activity in the T8 or L4 lesions.  Mild activity with sclerotic lesion in the posterior right acetabulum.  No soft tissue metastasis. - Some of the areas on the PET scan were new compared to prior PET scan. - She started ribociclib and exemestane on 08/07/2023. - She developed leg swelling and dark urine x 3. - Reviewed labs today: Normal LFTs.  Creatinine mildly elevated at 1.02.  White count is 2.6 with ANC of 1.4.  Platelet count and hemoglobin is normal. - I have reviewed twelve-lead EKG done in the office today which showed normal QTc.  Normal sinus rhythm. - Continue ribociclib 600 mg daily and exemestane daily.  RTC 2 weeks for follow-up with repeat EKG and labs.  2.  Peripheral neuropathy: - Constant tingling and numbness in the hands and feet is stable.  3.  Bone metastatic disease: - Continue denosumab monthly.  Continue calcium and vitamin D supplements.  4.  Leg swellings: - Continue Lasix 20 mg daily as needed.   No orders of the defined  types were placed in this encounter.     I,Katie Daubenspeck,acting as a Neurosurgeon for [name], MD.,have documented all relevant documentation on the behalf of [name], MD,as directed by  [name], MD while in the presence of [name], MD.   I, [name] MD, have reviewed the above documentation for accuracy and completeness, and I agree with the above.   [name], MD   8/26/20243:24 PM  CHIEF COMPLAINT:   Diagnosis: metastatic breast cancer    Cancer Staging  No matching staging information was found for the patient.    Prior Therapy: none  Current Therapy:  Palbociclib + Fulvestrant every 4 weeks    HISTORY OF PRESENT ILLNESS:   Oncology History  Primary malignant neoplasm of breast with metastasis (HCC)  07/06/2021 Initial Diagnosis   Metastatic breast cancer (HCC)   07/18/2021 - 08/15/2022 Chemotherapy   Patient is on Treatment Plan : BREAST Palbociclib + Fulvestrant q28d     07/18/2021 -  Chemotherapy   Patient is on Treatment Plan : BREAST Palbociclib D1-21 + Fulvestrant q28d        INTERVAL HISTORY:   Haidee is a 62 y.o. female presenting to clinic today for follow up of metastatic breast cancer. She was last seen by me on 07/23/23.  Today, she states that she is doing well overall. Her appetite level is at 50%. Her energy level is at 50%.  PAST MEDICAL HISTORY:   Past Medical History: Past Medical History:  Diagnosis Date   Allergy    Asthma    Asthma    diagnosed age 32   Bone metastasis 08/2020   was on prolia   Breast cancer (HCC)    started chemo 05/05/2019. finished 08/2019   Gangrene (HCC)    GERD (gastroesophageal reflux disease)    Hx of bilateral mastectomy 10/22/2019   Hypertension    Hypertension    started bp meds 2008   Lymphedema of arm    left arm   Osteopenia    Shingles 04/20/2020   left side    Surgical History: Past Surgical History:  Procedure Laterality Date   BIOPSY  01/22/2022   Procedure: BIOPSY;  Surgeon: Marguerita Merles, Reuel Boom, MD;  Location: [facility];  Service: Gastroenterology;;   Fidela Salisbury RELEASE Bilateral 2006   ESOPHAGOGASTRODUODENOSCOPY (EGD) WITH PROPOFOL N/A 01/22/2022   Procedure: ESOPHAGOGASTRODUODENOSCOPY (EGD) WITH PROPOFOL;   Surgeon: Dolores Frame, MD;  Location: [facility];  Service: Gastroenterology;  Laterality: N/A;  205   hysteroscopy     age 5   mastectomy Bilateral 10/22/2019   pt had necrosis after 1st surgery, had another surgery to remove implants 12/10/2019   MOUTH SURGERY     TUBAL LIGATION     TUBAL LIGATION Bilateral 1998   WISDOM TOOTH EXTRACTION     early 74's    Social History: Social History   Socioeconomic History   Marital status: Married    Spouse name: Not on file   Number of children: 2   Years of education: Not on file   Highest education level: GED or equivalent  Occupational History   Occupation: Disability  Tobacco Use   Smoking status: Every Day    Current packs/day: 0.50    Average packs/day: 0.5 packs/day for 40.0 years (20.0 ttl pk-yrs)    Types: Cigarettes   Smokeless tobacco: Never   Tobacco comments:    Smokes half a pack a day for 40 years.   Vaping Use  Vaping status: Never Used  Substance and Sexual Activity   Alcohol use: Never   Drug use: Never   Sexual activity: Yes  Other Topics Concern   Not on file  Social History Narrative   Lives with her husband.    Social Determinants of Health   Financial Resource Strain: Medium Risk (06/06/2023)   Overall Financial Resource Strain (CARDIA)    Difficulty of Paying Living Expenses: Somewhat hard  Food Insecurity: Food Insecurity Present (06/06/2023)   Hunger Vital Sign    Worried About Running Out of Food in the Last Year: Sometimes true    Ran Out of Food in the Last Year: Sometimes true  Transportation Needs: No Transportation Needs (06/06/2023)   PRAPARE - Administrator, Civil Service (Medical): No    Lack of Transportation (Non-Medical): No  Physical Activity: Insufficiently Active (06/06/2023)   Exercise Vital Sign    Days of Exercise per Week: 2 days    Minutes of Exercise per Session: 20 min  Stress: Stress Concern Present (06/06/2023)   [employer] of Occupational  Health - Occupational Stress Questionnaire    Feeling of Stress : Rather much  Social Connections: Unknown (06/06/2023)   Social Connection and Isolation Panel [NHANES]    Frequency of Communication with Friends and Family: Twice a week    Frequency of Social Gatherings with Friends and Family: Once a week    Attends Religious Services: Patient declined    Database administrator or Organizations: No    Attends Banker Meetings: Not on file    Marital Status: Married  Intimate Partner Violence: Not At Risk (03/04/2022)   Received from [facility]   Humiliation, Afraid, Rape, and Kick questionnaire    Fear of Current or Ex-Partner: No    Emotionally Abused: No    Physically Abused: No    Sexually Abused: No    Family History: Family History  Problem Relation Age of Onset   Leukemia Mother    Breast cancer Sister    Diabetes Maternal Uncle    Hyperlipidemia Maternal Uncle    Diabetes Mellitus II Maternal Grandmother    Heart disease Maternal Grandfather    Diabetes Maternal Grandfather    Breast cancer Cousin    Breast cancer Half-Sister    Osteoporosis Half-Sister    Lupus Half-Sister    Lupus Half-Sister    Osteoporosis Half-Sister    Cancer - Lung Neg Hx    Colon cancer Neg Hx     Current Medications:  Current Outpatient Medications:    amLODipine (NORVASC) 5 MG tablet, Take 1 tablet (5 mg total) by mouth daily., Disp: 90 tablet, Rfl: 2   Ascorbic Acid (VITAMIN C) 1000 MG tablet, Take 1,000 mg by mouth daily., Disp: , Rfl:    aspirin 81 MG EC tablet, Take 81 mg by mouth daily., Disp: , Rfl:    butalbital-acetaminophen-caffeine (FIORICET) 50-325-40 MG tablet, Take 1 tablet by mouth every 4 (four) hours as needed for headache., Disp: 30 tablet, Rfl: 0   Calcium-Magnesium-Vitamin D (CALCIUM 1200+D3 PO), Take 1 tablet by mouth daily., Disp: , Rfl:    cetirizine-pseudoephedrine (ZYRTEC-D) 5-120 MG tablet, Take  1 tablet by mouth daily., Disp: , Rfl:    CINNAMON PO, Take 1,000 mg by mouth daily., Disp: , Rfl:    cyanocobalamin 1000 MCG tablet, Take 1 tablet (1,000 mcg total) by mouth daily. (Patient taking differently: Take 1,000 mcg by  mouth every other day.), Disp: 30 tablet, Rfl: 6   cyclobenzaprine (FLEXERIL) 10 MG tablet, Take 1 tablet (10 mg total) by mouth 2 (two) times daily as needed for muscle spasms., Disp: 60 tablet, Rfl: 5   denosumab (XGEVA) 120 MG/1.7ML SOLN injection, Inject 120 mg into the skin every 30 (thirty) days., Disp: , Rfl:    dexamethasone (DECADRON) 0.5 MG/5ML solution, Take 0.5 mg by mouth daily., Disp: , Rfl:    dexamethasone 0.5 mg/5 mL - diphenhydrAMINE 12.5 mg/5 mL oral solution 2:1 mixture, Take 10 mLs by mouth 2 (two) times daily., Disp: 480 mL, Rfl: 2   exemestane (AROMASIN) 25 MG tablet, Take 1 tablet (25 mg total) by mouth daily after breakfast., Disp: 90 tablet, Rfl: 1   furosemide (LASIX) 20 MG tablet, Take 1 tablet (20 mg total) by mouth daily as needed for edema (Take daily as needed for edema)., Disp: 30 tablet, Rfl: 0   HYDROcodone-acetaminophen (NORCO) 5-325 MG tablet, Take 1 tablet by mouth every 12 (twelve) hours as needed for moderate pain., Disp: 30 tablet, Rfl: 0   lansoprazole (PREVACID) 30 MG capsule, TAKE 1 CAPSULE BY MOUTH IN THE MORNING AND AT BEDTIME, Disp: 120 capsule, Rfl: 3   Misc. Devices MISC, Please provide patient with left arm compression sleeve. Diagnosis metastatic breast cancer, bilateral mastectomy, left arm lymphedema, Disp: 1 each, Rfl: 0   NAPHCON-A 0.025-0.3 % ophthalmic solution, Place 2 drops into both eyes in the morning and at bedtime., Disp: , Rfl:    ribociclib succ (KISQALI, 600 MG DOSE,) 200 MG Therapy Pack, Take 3 tablets (600 mg total) by mouth daily. Take for 21 days on, 7 days off, repeat every 28 days., Disp: 63 tablet, Rfl: 3   Allergies: Allergies  Allergen Reactions   Augmentin [Amoxicillin-Pot Clavulanate] Anaphylaxis    Ciprofloxacin Anaphylaxis   Claritin [Loratadine] Anaphylaxis   Gabapentin Anaphylaxis   Keflex [Cephalexin] Anaphylaxis   Levofloxacin Anaphylaxis   Lisinopril Swelling   Lisinopril Anaphylaxis   Metoprolol Swelling   Metoprolol Anaphylaxis   Nexium [Esomeprazole] Anaphylaxis   Sulfa Antibiotics Hives and Swelling   Tetracyclines & Related Anaphylaxis   Iohexol Hives    Needs pre meds   Ultram [Tramadol] Other (See Comments)    Patient states medication irritates throat    Ceftin [Cefuroxime Axetil] Hives   Chromium Other (See Comments)    headache   Iodinated Contrast Media Hives   Misc. Sulfonamide Containing Compounds Other (See Comments)   Oxycodone Itching   Sulfa Antibiotics     Possible Reaction    Latex Rash    itching   Nickel Rash    itching    REVIEW OF SYSTEMS:   Review of Systems  Constitutional:  Negative for chills, fatigue and fever.  HENT:   Negative for lump/mass, mouth sores, nosebleeds, sore throat and trouble swallowing.   Eyes:  Negative for eye problems.  Respiratory:  Positive for cough and shortness of breath.   Cardiovascular:  Positive for leg swelling and palpitations. Negative for chest pain.  Gastrointestinal:  Negative for abdominal pain, constipation, diarrhea, nausea and vomiting.  Genitourinary:  Negative for bladder incontinence, difficulty urinating, dysuria, frequency, hematuria and nocturia.   Musculoskeletal:  Negative for arthralgias, back pain, flank pain, myalgias and neck pain.  Skin:  Negative for itching and rash.  Neurological:  Positive for dizziness and headaches. Negative for numbness.  Hematological:  Does not bruise/bleed easily.  Psychiatric/Behavioral:  Positive for sleep disturbance. Negative for depression and  suicidal ideas. The patient is not nervous/anxious.   All other systems reviewed and are negative.    VITALS:   Blood pressure 134/77, pulse 82, temperature 97.9 F (36.6 C), temperature source Oral,  resp. rate 16, weight 186 lb 9.6 oz (84.6 kg), last menstrual period 06/08/2012, SpO2 99%.  Wt Readings from Last 3 Encounters:  08/25/23 186 lb 9.6 oz (84.6 kg)  08/14/23 180 lb (81.6 kg)  07/23/23 189 lb (85.7 kg)    Body mass index is 36.38 kg/m.  Performance status (ECOG): 1 - Symptomatic but completely ambulatory  PHYSICAL EXAM:   Physical Exam Vitals and nursing note reviewed. Exam conducted with a chaperone present.  Constitutional:      Appearance: Normal appearance.  Cardiovascular:     Rate and Rhythm: Normal rate and regular rhythm.     Pulses: Normal pulses.     Heart sounds: Normal heart sounds.  Pulmonary:     Effort: Pulmonary effort is normal.     Breath sounds: Normal breath sounds.  Abdominal:     Palpations: Abdomen is soft. There is no hepatomegaly, splenomegaly or mass.     Tenderness: There is no abdominal tenderness.  Musculoskeletal:     Right lower leg: Edema present.     Left lower leg: Edema present.  Lymphadenopathy:     Cervical: No cervical adenopathy.     Right cervical: No superficial, deep or posterior cervical adenopathy.    Left cervical: No superficial, deep or posterior cervical adenopathy.     Upper Body:     Right upper body: No supraclavicular or axillary adenopathy.     Left upper body: No supraclavicular or axillary adenopathy.  Neurological:     General: No focal deficit present.     Mental Status: She is alert and oriented to person, place, and time.  Psychiatric:        Mood and Affect: Mood normal.        Behavior: Behavior normal.     LABS:      Latest Ref Rng & Units 08/25/2023   12:44 PM 08/14/2023   12:10 PM 08/05/2023    1:45 PM  CBC  WBC 4.0 - 10.5 K/uL 2.6  3.8  3.1   Hemoglobin 12.0 - 15.0 g/dL 54.0  98.1  19.1   Hematocrit 36.0 - 46.0 % 41.0  37.2  36.5   Platelets 150 - 400 K/uL 246  365  173       Latest Ref Rng & Units 08/25/2023   12:44 PM 08/14/2023   12:10 PM 08/05/2023    1:45 PM  CMP  Glucose 70 -  99 mg/dL 99  478  295   BUN 8 - 23 mg/dL 15  12  14    Creatinine 0.44 - 1.00 mg/dL 6.21  3.08  6.57   Sodium 135 - 145 mmol/L 137  138  139   Potassium 3.5 - 5.1 mmol/L 4.1  3.6  3.6   Chloride 98 - 111 mmol/L 105  105  107   CO2 22 - 32 mmol/L 22  22  22    Calcium 8.9 - 10.3 mg/dL 9.6  84.6  9.2   Total Protein 6.5 - 8.1 g/dL 7.8  7.5  6.7   Total Bilirubin 0.3 - 1.2 mg/dL 0.8  0.8  0.5   Alkaline Phos 38 - 126 U/L 60  61  51   AST 15 - 41 U/L 32  36  39   ALT 0 -  44 U/L 44  48  53      No results found for: "CEA1", "CEA" / No results found for: "CEA1", "CEA" No results found for: "PSA1" No results found for: "ZOX096" No results found for: "CAN125"  No results found for: "TOTALPROTELP", "ALBUMINELP", "A1GS", "A2GS", "BETS", "BETA2SER", "GAMS", "MSPIKE", "SPEI" No results found for: "TIBC", "FERRITIN", "IRONPCTSAT" No results found for: "LDH"   STUDIES:   DG Chest Portable 1 View  Result Date: 08/14/2023 CLINICAL DATA:  Chest pain.  Left breast carcinoma EXAM: PORTABLE CHEST 1 VIEW COMPARISON:  CT 04/07/2023 and older.  PET-CT 07/17/2023 FINDINGS: No consolidation, pneumothorax or effusion. No edema. Normal cardiopericardial silhouette. Overlapping cardiac leads. IMPRESSION: No acute cardiopulmonary disease. Electronically Signed   By: Karen Kays M.D.   On: 08/14/2023 12:59

## 2023-08-26 ENCOUNTER — Other Ambulatory Visit: Payer: Self-pay

## 2023-08-26 LAB — CANCER ANTIGEN 15-3: CA 15-3: 32.2 U/mL — ABNORMAL HIGH (ref 0.0–25.0)

## 2023-08-27 ENCOUNTER — Other Ambulatory Visit (HOSPITAL_COMMUNITY): Payer: Self-pay

## 2023-08-27 LAB — CANCER ANTIGEN 27.29: CA 27.29: 40.2 U/mL — ABNORMAL HIGH (ref 0.0–38.6)

## 2023-09-02 ENCOUNTER — Inpatient Hospital Stay: Payer: Medicaid Other

## 2023-09-02 ENCOUNTER — Inpatient Hospital Stay: Payer: Medicaid Other | Attending: Hematology

## 2023-09-02 VITALS — BP 118/71 | HR 80 | Temp 97.0°F | Resp 20 | Wt 190.0 lb

## 2023-09-02 DIAGNOSIS — Z170 Estrogen receptor positive status [ER+]: Secondary | ICD-10-CM | POA: Insufficient documentation

## 2023-09-02 DIAGNOSIS — Z79899 Other long term (current) drug therapy: Secondary | ICD-10-CM | POA: Diagnosis not present

## 2023-09-02 DIAGNOSIS — C7951 Secondary malignant neoplasm of bone: Secondary | ICD-10-CM | POA: Diagnosis not present

## 2023-09-02 DIAGNOSIS — C50919 Malignant neoplasm of unspecified site of unspecified female breast: Secondary | ICD-10-CM

## 2023-09-02 DIAGNOSIS — G629 Polyneuropathy, unspecified: Secondary | ICD-10-CM | POA: Diagnosis not present

## 2023-09-02 DIAGNOSIS — Z79811 Long term (current) use of aromatase inhibitors: Secondary | ICD-10-CM | POA: Diagnosis not present

## 2023-09-02 DIAGNOSIS — M8580 Other specified disorders of bone density and structure, unspecified site: Secondary | ICD-10-CM | POA: Insufficient documentation

## 2023-09-02 DIAGNOSIS — Z7982 Long term (current) use of aspirin: Secondary | ICD-10-CM | POA: Diagnosis not present

## 2023-09-02 DIAGNOSIS — C50912 Malignant neoplasm of unspecified site of left female breast: Secondary | ICD-10-CM | POA: Insufficient documentation

## 2023-09-02 MED ORDER — DENOSUMAB 120 MG/1.7ML ~~LOC~~ SOLN
120.0000 mg | Freq: Once | SUBCUTANEOUS | Status: AC
  Administered 2023-09-02: 120 mg via SUBCUTANEOUS
  Filled 2023-09-02: qty 1.7

## 2023-09-02 NOTE — Progress Notes (Signed)
Wendy Zamora presents today for injection per the provider's orders.  Xgeva 120 mg administration without incident; injection site WNL; see MAR for injection details.  Patient tolerated procedure well and without incident.  No questions or complaints noted at this time.   Patient denies any tooth or jaw pain. No recent or future dental appointments at this time. Pt reports taking Calcium and Vit D supplements as directed.  Discharged from clinic ambulatory in stable condition. Alert and oriented x 3. F/U with [facility] as scheduled.

## 2023-09-02 NOTE — Patient Instructions (Signed)
[facility]  Discharge Instructions: Thank you for choosing [facility] to provide your oncology and hematology care.  If you have a lab appointment with the Cancer Center - please note that after April 8th, 2024, all labs will be drawn in the cancer center.  You do not have to check in or register with the main entrance as you have in the past but will complete your check-in in the cancer center.  Wear comfortable clothing and clothing appropriate for easy access to any Portacath or PICC line.   We strive to give you quality time with your provider. You may need to reschedule your appointment if you arrive late (15 or more minutes).  Arriving late affects you and other patients whose appointments are after yours.  Also, if you miss three or more appointments without notifying the office, you may be dismissed from the clinic at the provider's discretion.      For prescription refill requests, have your pharmacy contact our office and allow 72 hours for refills to be completed.    Today you received Xgeva injection.     BELOW ARE SYMPTOMS THAT SHOULD BE REPORTED IMMEDIATELY: *FEVER GREATER THAN 100.4 F (38 C) OR HIGHER *CHILLS OR SWEATING *NAUSEA AND VOMITING THAT IS NOT CONTROLLED WITH YOUR NAUSEA MEDICATION *UNUSUAL SHORTNESS OF BREATH *UNUSUAL BRUISING OR BLEEDING *URINARY PROBLEMS (pain or burning when urinating, or frequent urination) *BOWEL PROBLEMS (unusual diarrhea, constipation, pain near the anus) TENDERNESS IN MOUTH AND THROAT WITH OR WITHOUT PRESENCE OF ULCERS (sore throat, sores in mouth, or a toothache) UNUSUAL RASH, SWELLING OR PAIN  UNUSUAL VAGINAL DISCHARGE OR ITCHING   Items with * indicate a potential emergency and should be followed up as soon as possible or go to the Emergency Department if any problems should occur.  Please show the CHEMOTHERAPY ALERT CARD or IMMUNOTHERAPY ALERT CARD at check-in to the Emergency Department and  triage nurse.  Should you have questions after your visit or need to cancel or reschedule your appointment, please contact [facility] [phone]  and follow the prompts.  Office hours are 8:00 a.m. to 4:30 p.m. Monday - Friday. Please note that voicemails left after 4:00 p.m. may not be returned until the following business day.  We are closed weekends and major holidays. You have access to a nurse at all times for urgent questions. Please call the main number to the clinic [phone] and follow the prompts.  For any non-urgent questions, you may also contact your provider using MyChart. We now offer e-Visits for anyone 37 and older to request care online for non-urgent symptoms. For details visit [email or web address].   Also download the MyChart app! Go to the app store, search "MyChart", open the app, select [place], and log in with your MyChart username and password.

## 2023-09-04 ENCOUNTER — Other Ambulatory Visit (HOSPITAL_COMMUNITY): Payer: Self-pay

## 2023-09-09 ENCOUNTER — Inpatient Hospital Stay: Payer: Medicaid Other

## 2023-09-09 ENCOUNTER — Inpatient Hospital Stay (HOSPITAL_BASED_OUTPATIENT_CLINIC_OR_DEPARTMENT_OTHER): Payer: Medicaid Other | Admitting: Hematology

## 2023-09-09 VITALS — BP 141/74 | HR 71 | Temp 98.2°F | Resp 18 | Wt 188.9 lb

## 2023-09-09 DIAGNOSIS — Z5181 Encounter for therapeutic drug level monitoring: Secondary | ICD-10-CM | POA: Diagnosis not present

## 2023-09-09 DIAGNOSIS — C50912 Malignant neoplasm of unspecified site of left female breast: Secondary | ICD-10-CM | POA: Diagnosis not present

## 2023-09-09 DIAGNOSIS — Z79899 Other long term (current) drug therapy: Secondary | ICD-10-CM

## 2023-09-09 DIAGNOSIS — C7951 Secondary malignant neoplasm of bone: Secondary | ICD-10-CM | POA: Diagnosis not present

## 2023-09-09 DIAGNOSIS — C50919 Malignant neoplasm of unspecified site of unspecified female breast: Secondary | ICD-10-CM

## 2023-09-09 DIAGNOSIS — Z7982 Long term (current) use of aspirin: Secondary | ICD-10-CM | POA: Diagnosis not present

## 2023-09-09 DIAGNOSIS — G629 Polyneuropathy, unspecified: Secondary | ICD-10-CM | POA: Diagnosis not present

## 2023-09-09 DIAGNOSIS — M8580 Other specified disorders of bone density and structure, unspecified site: Secondary | ICD-10-CM | POA: Diagnosis not present

## 2023-09-09 DIAGNOSIS — Z79811 Long term (current) use of aromatase inhibitors: Secondary | ICD-10-CM | POA: Diagnosis not present

## 2023-09-09 DIAGNOSIS — Z170 Estrogen receptor positive status [ER+]: Secondary | ICD-10-CM | POA: Diagnosis not present

## 2023-09-09 LAB — CBC WITH DIFFERENTIAL/PLATELET
Abs Immature Granulocytes: 0.01 10*3/uL (ref 0.00–0.07)
Basophils Absolute: 0.1 10*3/uL (ref 0.0–0.1)
Basophils Relative: 2 %
Eosinophils Absolute: 0 10*3/uL (ref 0.0–0.5)
Eosinophils Relative: 1 %
HCT: 41 % (ref 36.0–46.0)
Hemoglobin: 14 g/dL (ref 12.0–15.0)
Immature Granulocytes: 0 %
Lymphocytes Relative: 33 %
Lymphs Abs: 1.1 10*3/uL (ref 0.7–4.0)
MCH: 36.1 pg — ABNORMAL HIGH (ref 26.0–34.0)
MCHC: 34.1 g/dL (ref 30.0–36.0)
MCV: 105.7 fL — ABNORMAL HIGH (ref 80.0–100.0)
Monocytes Absolute: 0.3 10*3/uL (ref 0.1–1.0)
Monocytes Relative: 8 %
Neutro Abs: 1.9 10*3/uL (ref 1.7–7.7)
Neutrophils Relative %: 56 %
Platelets: 366 10*3/uL (ref 150–400)
RBC: 3.88 MIL/uL (ref 3.87–5.11)
RDW: 13.6 % (ref 11.5–15.5)
WBC: 3.3 10*3/uL — ABNORMAL LOW (ref 4.0–10.5)
nRBC: 0 % (ref 0.0–0.2)

## 2023-09-09 LAB — COMPREHENSIVE METABOLIC PANEL
ALT: 36 U/L (ref 0–44)
AST: 27 U/L (ref 15–41)
Albumin: 4.3 g/dL (ref 3.5–5.0)
Alkaline Phosphatase: 65 U/L (ref 38–126)
Anion gap: 9 (ref 5–15)
BUN: 13 mg/dL (ref 8–23)
CO2: 23 mmol/L (ref 22–32)
Calcium: 9.2 mg/dL (ref 8.9–10.3)
Chloride: 106 mmol/L (ref 98–111)
Creatinine, Ser: 1 mg/dL (ref 0.44–1.00)
GFR, Estimated: >60 mL/min (ref >60)
Glucose, Bld: 108 mg/dL — ABNORMAL HIGH (ref 70–99)
Potassium: 3.8 mmol/L (ref 3.5–5.1)
Sodium: 138 mmol/L (ref 135–145)
Total Bilirubin: 0.8 mg/dL (ref 0.3–1.2)
Total Protein: 7.8 g/dL (ref 6.5–8.1)

## 2023-09-09 LAB — MAGNESIUM: Magnesium: 2.3 mg/dL (ref 1.7–2.4)

## 2023-09-09 NOTE — Patient Instructions (Addendum)
[facility] at [facility] Discharge Instructions   You were seen and examined today by Dr. Ellin Saba.  He reviewed the results of your lab work which are normal/stable.   Continue Kisqali and exemestane as prescribed.   Return as scheduled.    Thank you for choosing [facility] at [facility] to provide your oncology and hematology care.  To afford each patient quality time with our provider, please arrive at least 15 minutes before your scheduled appointment time.   If you have a lab appointment with the Cancer Center please come in thru the Main Entrance and check in at the main information desk.  You need to re-schedule your appointment should you arrive 10 or more minutes late.  We strive to give you quality time with our providers, and arriving late affects you and other patients whose appointments are after yours.  Also, if you no show three or more times for appointments you may be dismissed from the clinic at the providers discretion.     Again, thank you for choosing [facility].  Our hope is that these requests will decrease the amount of time that you wait before being seen by our physicians.       _____________________________________________________________  Should you have questions after your visit to [facility], please contact our office at [phone] and follow the prompts.  Our office hours are 8:00 a.m. and 4:30 p.m. Monday - Friday.  Please note that voicemails left after 4:00 p.m. may not be returned until the following business day.  We are closed weekends and major holidays.  You do have access to a nurse 24-7, just call the main number to the clinic [phone] and do not press any options, hold on the line and a nurse will answer the phone.    For prescription refill requests, have your pharmacy contact our office and allow 72 hours.    Due to Covid, you will need to wear a mask upon  entering the hospital. If you do not have a mask, a mask will be given to you at the Main Entrance upon arrival. For doctor visits, patients may have 1 support person age 93 or older with them. For treatment visits, patients can not have anyone with them due to social distancing guidelines and our immunocompromised population.

## 2023-09-09 NOTE — Progress Notes (Signed)
Patient is taking Kisqali as prescribed.  She has not missed any doses and reports no side effects at this time.

## 2023-09-09 NOTE — Progress Notes (Signed)
[name] [address], [place] [address]    Clinic Day:  09/09/2023  Referring physician: Billie Lade, MD  Patient Care Team: Billie Lade, MD as PCP - General (Internal Medicine) [name], RN as Oncology Nurse Navigator (Oncology) [name], MD as Medical Oncologist (Medical Oncology)   ASSESSMENT & PLAN:   Assessment: 1.  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 - Started on Ibrance and Faslodex in December 2021 for metastatic disease. - PET scan on 08/10/2021 with no evidence of hypermetabolic metastatic disease.  Widespread scattered sclerotic bone metastasis in the spine and pelvis are stable and not metabolically active.  No lymphadenopathy.  Asymmetric muscular uptake in the left lower scalene muscles and in the intercostal muscles of the ventral upper left chest wall without discrete mass correlate on the CT images, favoring activity related uptake. Wendy Zamora was dose reduced to 100 mg 3 weeks on/1 week off on 10/11/2021 due to fatigue.  Discontinued on 07/23/2023 due to progression - Ribociclib and exemestane started on 08/07/2023  2.  Social/family history: - She is a retired Engineer, structural.  She recently moved back from [place]. - Younger sister had breast cancer in her 25s.  Mother died of leukemia at age 75.  Paternal cousin also had breast cancer.  3.  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  She underwent immediate implants which were later removed on 12/10/2019 secondary to infection and necrosis. - XRT to the chest wall and regional lymph nodes, 60 Gray, from 02/29/2020 through 04/12/2020 - She was treated with adjuvant anastrozole which was started 12/09/2019 and was found to have metastatic disease in November 2021. - Previous genetic testing was reportedly negative and [place].    Plan: 1.  Metastatic breast cancer to the bones, ER/PR positive, HER2 negative: - PET scan on 07/17/2023: Intense activity in the T7 and T9 vertebral bodies.  No metabolic activity in the T8 or L4 vertebral bodies. - She is tolerating ribociclib reasonably well.  Also tolerating exemestane.  She reports occasional hot flashes. - Reviewed labs from today: Normal LFTs.  Normal electrolytes.  White count is 3.3 with ANC of 1.9.  Rest of the CBC was normal.  CA 15-3 has improved to 32.2 and CA 27-29 has improved 40.2. - We have done EKG in the office today.  QTc is 428 ms.  Normal sinus rhythm with nonspecific T wave abnormalities stable. - Recommend continuing ribociclib 600 mg twice daily and exemestane daily.  RTC 4 weeks for follow-up.  Will repeat labs at that time and EKG. - Recommend repeating PET scan 3 months after the start of ribociclib.  2.  Peripheral neuropathy: - Constant tingling and numbness in the hands and feet is stable.  She has occasional pins and needle sensation.  She is allergic to gabapentin.  She has used the Cymbalta in the past without help.  If any worsening, consider Lyrica.  3.  Bone  metastatic disease: - Continue denosumab monthly.  Continue calcium and vitamin D supplements.  Latest calcium is 9.2.  4.  Leg swellings: - Continue Lasix 20 mg daily as needed.  She has mild fluid retention in the legs and dorsum of the hands, likely from ribociclib.   Orders Placed This Encounter  Procedures   EKG  12-Lead      I,Helena R Teague,acting as a scribe for [name], MD.,have documented all relevant documentation on the behalf of [name], MD,as directed by  [name], MD while in the presence of [name], MD.  I, [name] MD, have reviewed the above documentation for accuracy and completeness, and I agree with the above.    [name], MD   9/10/20244:39 PM  CHIEF COMPLAINT:   Diagnosis: metastatic breast cancer    Cancer Staging  No matching staging information was found for the patient.    Prior Therapy: none  Current Therapy:  Palbociclib + Fulvestrant every 4 weeks    HISTORY OF PRESENT ILLNESS:   Oncology History  Primary malignant neoplasm of breast with metastasis (HCC)  07/06/2021 Initial Diagnosis   Metastatic breast cancer (HCC)   07/18/2021 - 08/15/2022 Chemotherapy   Patient is on Treatment Plan : BREAST Palbociclib + Fulvestrant q28d     07/18/2021 -  Chemotherapy   Patient is on Treatment Plan : BREAST Palbociclib D1-21 + Fulvestrant q28d        INTERVAL HISTORY:   Wendy Zamora is a 62 y.o. female presenting to clinic today for follow up of metastatic breast cancer. She was last seen by me on 08/25/23.  Today, she states that she is doing well overall. Her appetite level is at 50%. Her energy level is at 0%.  She reports edema in the bilateral hands and lower extremities. She reports constant tingling in the hands and feet that is stable. Her feet are constantly hot. She is allergic to Gabapentin. She is taking Lasix 2-3x a week.   She reports multiple intermittent episodes of chills, followed by hot flashes.   She c/o itching on the mole in the middle of her back after [place] [id number] that is tender upon palpation for the last 2 weeks. She reports a burning sensation to the area last week.   She has an endoscopy scheduled November 2024 at [facility]   PAST MEDICAL HISTORY:   Past Medical  History: Past Medical History:  Diagnosis Date   Allergy    Asthma    Asthma    diagnosed age 57   Bone metastasis 08/2020   was on prolia   Breast cancer (HCC)    started chemo 05/05/2019. finished 08/2019   Gangrene (HCC)    GERD (gastroesophageal reflux disease)    Hx of bilateral mastectomy 10/22/2019   Hypertension    Hypertension    started bp meds 2008   Lymphedema of arm    left arm   Osteopenia    Shingles 04/20/2020   left side    Surgical History: Past Surgical History:  Procedure Laterality Date   BIOPSY  01/22/2022   Procedure: BIOPSY;  Surgeon: Marguerita Merles, Reuel Boom, MD;  Location: [facility];  Service: Gastroenterology;;   Fidela Salisbury RELEASE Bilateral 2006   ESOPHAGOGASTRODUODENOSCOPY (EGD) WITH PROPOFOL N/A 01/22/2022   Procedure: ESOPHAGOGASTRODUODENOSCOPY (EGD) WITH PROPOFOL;  Surgeon: Dolores Frame, MD;  Location: [facility];  Service: Gastroenterology;  Laterality: N/A;  205   hysteroscopy     age 86  mastectomy Bilateral 10/22/2019   pt had necrosis after 1st surgery, had another surgery to remove implants 12/10/2019   MOUTH SURGERY     TUBAL LIGATION     TUBAL LIGATION Bilateral 1998   WISDOM TOOTH EXTRACTION     early 34's    Social History: Social History   Socioeconomic History   Marital status: Married    Spouse name: Not on file   Number of children: 2   Years of education: Not on file   Highest education level: GED or equivalent  Occupational History   Occupation: Disability  Tobacco Use   Smoking status: Every Day    Current packs/day: 0.50    Average packs/day: 0.5 packs/day for 40.0 years (20.0 ttl pk-yrs)    Types: Cigarettes   Smokeless tobacco: Never   Tobacco comments:    Smokes half a pack a day for 40 years.   Vaping Use   Vaping status: Never Used  Substance and Sexual Activity   Alcohol use: Never   Drug use: Never   Sexual activity: Yes  Other Topics Concern   Not on file  Social History  Narrative   Lives with her husband.    Social Determinants of Health   Financial Resource Strain: Medium Risk (06/06/2023)   Overall Financial Resource Strain (CARDIA)    Difficulty of Paying Living Expenses: Somewhat hard  Food Insecurity: Food Insecurity Present (06/06/2023)   Hunger Vital Sign    Worried About Running Out of Food in the Last Year: Sometimes true    Ran Out of Food in the Last Year: Sometimes true  Transportation Needs: No Transportation Needs (06/06/2023)   PRAPARE - Administrator, Civil Service (Medical): No    Lack of Transportation (Non-Medical): No  Physical Activity: Insufficiently Active (06/06/2023)   Exercise Vital Sign    Days of Exercise per Week: 2 days    Minutes of Exercise per Session: 20 min  Stress: Stress Concern Present (06/06/2023)   [employer] of Occupational Health - Occupational Stress Questionnaire    Feeling of Stress : Rather much  Social Connections: Unknown (06/06/2023)   Social Connection and Isolation Panel [NHANES]    Frequency of Communication with Friends and Family: Twice a week    Frequency of Social Gatherings with Friends and Family: Once a week    Attends Religious Services: Patient declined    Database administrator or Organizations: No    Attends Banker Meetings: Not on file    Marital Status: Married  Intimate Partner Violence: Not At Risk (03/04/2022)   Received from [facility]   Humiliation, Afraid, Rape, and Kick questionnaire    Fear of Current or Ex-Partner: No    Emotionally Abused: No    Physically Abused: No    Sexually Abused: No    Family History: Family History  Problem Relation Age of Onset   Leukemia Mother    Breast cancer Sister    Diabetes Maternal Uncle    Hyperlipidemia Maternal Uncle    Diabetes Mellitus II Maternal Grandmother    Heart disease Maternal Grandfather    Diabetes Maternal Grandfather    Breast cancer  Cousin    Breast cancer Half-Sister    Osteoporosis Half-Sister    Lupus Half-Sister    Lupus Half-Sister    Osteoporosis Half-Sister    Cancer - Lung Neg Hx    Colon cancer Neg Hx  Current Medications:  Current Outpatient Medications:    amLODipine (NORVASC) 5 MG tablet, Take 1 tablet (5 mg total) by mouth daily., Disp: 90 tablet, Rfl: 2   Ascorbic Acid (VITAMIN C) 1000 MG tablet, Take 1,000 mg by mouth daily., Disp: , Rfl:    aspirin 81 MG EC tablet, Take 81 mg by mouth daily., Disp: , Rfl:    butalbital-acetaminophen-caffeine (FIORICET) 50-325-40 MG tablet, Take 1 tablet by mouth every 4 (four) hours as needed for headache., Disp: 30 tablet, Rfl: 0   Calcium-Magnesium-Vitamin D (CALCIUM 1200+D3 PO), Take 1 tablet by mouth daily., Disp: , Rfl:    cetirizine-pseudoephedrine (ZYRTEC-D) 5-120 MG tablet, Take 1 tablet by mouth daily., Disp: , Rfl:    CINNAMON PO, Take 1,000 mg by mouth daily., Disp: , Rfl:    cyanocobalamin 1000 MCG tablet, Take 1 tablet (1,000 mcg total) by mouth daily. (Patient taking differently: Take 1,000 mcg by mouth every other day.), Disp: 30 tablet, Rfl: 6   cyclobenzaprine (FLEXERIL) 10 MG tablet, Take 1 tablet (10 mg total) by mouth 2 (two) times daily as needed for muscle spasms., Disp: 60 tablet, Rfl: 5   denosumab (XGEVA) 120 MG/1.7ML SOLN injection, Inject 120 mg into the skin every 30 (thirty) days., Disp: , Rfl:    dexamethasone (DECADRON) 0.5 MG/5ML solution, Take 0.5 mg by mouth daily., Disp: , Rfl:    dexamethasone 0.5 mg/5 mL - diphenhydrAMINE 12.5 mg/5 mL oral solution 2:1 mixture, Take 10 mLs by mouth 2 (two) times daily., Disp: 480 mL, Rfl: 2   exemestane (AROMASIN) 25 MG tablet, Take 1 tablet (25 mg total) by mouth daily after breakfast., Disp: 90 tablet, Rfl: 1   furosemide (LASIX) 20 MG tablet, Take 1 tablet (20 mg total) by mouth daily as needed for edema (Take daily as needed for edema)., Disp: 30 tablet, Rfl: 0   HYDROcodone-acetaminophen  (NORCO) 5-325 MG tablet, Take 1 tablet by mouth every 12 (twelve) hours as needed for moderate pain., Disp: 30 tablet, Rfl: 0   lansoprazole (PREVACID) 30 MG capsule, TAKE 1 CAPSULE BY MOUTH IN THE MORNING AND AT BEDTIME, Disp: 120 capsule, Rfl: 3   Misc. Devices MISC, Please provide patient with left arm compression sleeve. Diagnosis metastatic breast cancer, bilateral mastectomy, left arm lymphedema, Disp: 1 each, Rfl: 0   NAPHCON-A 0.025-0.3 % ophthalmic solution, Place 2 drops into both eyes in the morning and at bedtime., Disp: , Rfl:    ribociclib succ (KISQALI, 600 MG DOSE,) 200 MG Therapy Pack, Take 3 tablets (600 mg total) by mouth daily. Take for 21 days on, 7 days off, repeat every 28 days., Disp: 63 tablet, Rfl: 3   Allergies: Allergies  Allergen Reactions   Augmentin [Amoxicillin-Pot Clavulanate] Anaphylaxis   Ciprofloxacin Anaphylaxis   Claritin [Loratadine] Anaphylaxis   Gabapentin Anaphylaxis   Keflex [Cephalexin] Anaphylaxis   Levofloxacin Anaphylaxis   Lisinopril Swelling   Lisinopril Anaphylaxis   Metoprolol Swelling   Metoprolol Anaphylaxis   Nexium [Esomeprazole] Anaphylaxis   Sulfa Antibiotics Hives and Swelling   Tetracyclines & Related Anaphylaxis   Iohexol Hives    Needs pre meds   Ultram [Tramadol] Other (See Comments)    Patient states medication irritates throat    Ceftin [Cefuroxime Axetil] Hives   Chromium Other (See Comments)    headache   Iodinated Contrast Media Hives   Misc. Sulfonamide Containing Compounds Other (See Comments)   Oxycodone Itching   Sulfa Antibiotics     Possible Reaction  Latex Rash    itching   Nickel Rash    itching    REVIEW OF SYSTEMS:   Review of Systems  Constitutional:  Positive for chills. Negative for fatigue and fever.  HENT:   Positive for trouble swallowing (solids). Negative for lump/mass, mouth sores, nosebleeds and sore throat.   Eyes:  Negative for eye problems.  Respiratory:  Positive for cough and  shortness of breath.   Cardiovascular:  Positive for leg swelling. Negative for chest pain and palpitations.  Gastrointestinal:  Positive for constipation and nausea. Negative for abdominal pain, diarrhea and vomiting.  Endocrine: Positive for hot flashes.  Genitourinary:  Negative for bladder incontinence, difficulty urinating, dysuria, frequency, hematuria and nocturia.   Musculoskeletal:  Positive for arthralgias (generalized, 6/10 severity). Negative for back pain, flank pain, myalgias and neck pain.  Skin:  Positive for itching (on mole in middle of the back). Negative for rash.  Neurological:  Positive for headaches and numbness (in hands and feet). Negative for dizziness.  Hematological:  Does not bruise/bleed easily.  Psychiatric/Behavioral:  Positive for sleep disturbance. Negative for depression and suicidal ideas. The patient is not nervous/anxious.   All other systems reviewed and are negative.    VITALS:   Blood pressure (!) 141/74, pulse 71, temperature 98.2 F (36.8 C), temperature source Oral, resp. rate 18, weight 188 lb 14.4 oz (85.7 kg), last menstrual period 06/08/2012, SpO2 99%.  Wt Readings from Last 3 Encounters:  09/09/23 188 lb 14.4 oz (85.7 kg)  09/02/23 190 lb (86.2 kg)  08/25/23 186 lb 9.6 oz (84.6 kg)    Body mass index is 36.83 kg/m.  Performance status (ECOG): 1 - Symptomatic but completely ambulatory  PHYSICAL EXAM:   Physical Exam Vitals and nursing note reviewed. Exam conducted with a chaperone present.  Constitutional:      Appearance: Normal appearance.  Cardiovascular:     Rate and Rhythm: Normal rate and regular rhythm.     Pulses: Normal pulses.     Heart sounds: Normal heart sounds.  Pulmonary:     Effort: Pulmonary effort is normal.     Breath sounds: Normal breath sounds.  Abdominal:     Palpations: Abdomen is soft. There is no hepatomegaly, splenomegaly or mass.     Tenderness: There is no abdominal tenderness.  Musculoskeletal:      Right lower leg: No edema.     Left lower leg: No edema.  Lymphadenopathy:     Cervical: No cervical adenopathy.     Right cervical: No superficial, deep or posterior cervical adenopathy.    Left cervical: No superficial, deep or posterior cervical adenopathy.     Upper Body:     Right upper body: No supraclavicular or axillary adenopathy.     Left upper body: No supraclavicular or axillary adenopathy.  Skin:    Comments: +5 mm brown lesion in the mid back that is raised and rounded area, nonerythematous, and tender to touch    Neurological:     General: No focal deficit present.     Mental Status: She is alert and oriented to person, place, and time.  Psychiatric:        Mood and Affect: Mood normal.        Behavior: Behavior normal.     LABS:      Latest Ref Rng & Units 09/09/2023   12:05 PM 08/25/2023   12:44 PM 08/14/2023   12:10 PM  CBC  WBC 4.0 - 10.5 K/uL 3.3  2.6  3.8   Hemoglobin 12.0 - 15.0 g/dL 65.7  84.6  96.2   Hematocrit 36.0 - 46.0 % 41.0  41.0  37.2   Platelets 150 - 400 K/uL 366  246  365       Latest Ref Rng & Units 09/09/2023   12:05 PM 08/25/2023   12:44 PM 08/14/2023   12:10 PM  CMP  Glucose 70 - 99 mg/dL 952  99  841   BUN 8 - 23 mg/dL 13  15  12    Creatinine 0.44 - 1.00 mg/dL 3.24  4.01  0.27   Sodium 135 - 145 mmol/L 138  137  138   Potassium 3.5 - 5.1 mmol/L 3.8  4.1  3.6   Chloride 98 - 111 mmol/L 106  105  105   CO2 22 - 32 mmol/L 23  22  22    Calcium 8.9 - 10.3 mg/dL 9.2  9.6  25.3   Total Protein 6.5 - 8.1 g/dL 7.8  7.8  7.5   Total Bilirubin 0.3 - 1.2 mg/dL 0.8  0.8  0.8   Alkaline Phos 38 - 126 U/L 65  60  61   AST 15 - 41 U/L 27  32  36   ALT 0 - 44 U/L 36  44  48      No results found for: "CEA1", "CEA" / No results found for: "CEA1", "CEA" No results found for: "PSA1" No results found for: "GUY403" No results found for: "CAN125"  No results found for: "TOTALPROTELP", "ALBUMINELP", "A1GS", "A2GS", "BETS", "BETA2SER", "GAMS",  "MSPIKE", "SPEI" No results found for: "TIBC", "FERRITIN", "IRONPCTSAT" No results found for: "LDH"   STUDIES:   DG Chest Portable 1 View  Result Date: 08/14/2023 CLINICAL DATA:  Chest pain.  Left breast carcinoma EXAM: PORTABLE CHEST 1 VIEW COMPARISON:  CT 04/07/2023 and older.  PET-CT 07/17/2023 FINDINGS: No consolidation, pneumothorax or effusion. No edema. Normal cardiopericardial silhouette. Overlapping cardiac leads. IMPRESSION: No acute cardiopulmonary disease. Electronically Signed   By: Karen Kays M.D.   On: 08/14/2023 12:59

## 2023-09-14 ENCOUNTER — Encounter: Payer: Self-pay | Admitting: Hematology

## 2023-09-15 ENCOUNTER — Other Ambulatory Visit (HOSPITAL_COMMUNITY): Payer: Self-pay

## 2023-09-19 ENCOUNTER — Encounter: Payer: Self-pay | Admitting: Internal Medicine

## 2023-09-19 ENCOUNTER — Ambulatory Visit: Payer: Medicaid Other | Admitting: Internal Medicine

## 2023-09-19 DIAGNOSIS — M7989 Other specified soft tissue disorders: Secondary | ICD-10-CM

## 2023-09-19 DIAGNOSIS — R1310 Dysphagia, unspecified: Secondary | ICD-10-CM | POA: Diagnosis not present

## 2023-09-19 DIAGNOSIS — I10 Essential (primary) hypertension: Secondary | ICD-10-CM

## 2023-09-19 DIAGNOSIS — G5601 Carpal tunnel syndrome, right upper limb: Secondary | ICD-10-CM

## 2023-09-19 DIAGNOSIS — E782 Mixed hyperlipidemia: Secondary | ICD-10-CM | POA: Diagnosis not present

## 2023-09-19 NOTE — Patient Instructions (Signed)
It was a pleasure to see you today.  Thank you for giving [place] the opportunity to be involved in your care.  Below is a brief recap of your visit and next steps.  We will plan to see you again in 6 months.  Summary No medication changes today I recommend using compression stockings and elevating your legs when able to help with swelling. Please see the attached information on the Mediterranean diet Follow up in 6 months

## 2023-09-19 NOTE — Progress Notes (Signed)
Established Patient Office Visit  Subjective   Patient ID: Wendy Zamora, female    DOB: 1961/12/20  Age: 62 y.o. MRN: [id number]  Chief Complaint  Patient presents with   Foot Swelling    Hands , knees, ankles and feet swelling    vitmain b deficiency    Follow up    Wendy Zamora returns to care today for routine follow-up.  She was last evaluated by me on 6/11 at that time she endorsed pain in her right wrist similar to what she had previously experienced with carpal tunnel syndrome.  I recommended that she wear a wrist brace for symptom relief.  I also recommended that she use Tylenol as needed for relief of chronic musculoskeletal pain.  In the interim, she has been evaluated by gastroenterology and oncology for follow-up.  ED presentation on 8/15 endorsing chest pain.  Cardiac workup was unremarkable.  There have otherwise been no acute interval events.  Wendy Zamora reports feeling fairly well today.  She endorses swelling in her hands and feet bilaterally.  This has been attributed to a medication side effect from ribociclib.  She is taking Lasix 20 mg daily as needed for relief.  Past Medical History:  Diagnosis Date   Allergy    Asthma    Asthma    diagnosed age 36   Bone metastasis 08/2020   was on prolia   Breast cancer (HCC)    started chemo 05/05/2019. finished 08/2019   Gangrene (HCC)    GERD (gastroesophageal reflux disease)    Hx of bilateral mastectomy 10/22/2019   Hypertension    Hypertension    started bp meds 2008   Lymphedema of arm    left arm   Osteopenia    Shingles 04/20/2020   left side   Past Surgical History:  Procedure Laterality Date   BIOPSY  01/22/2022   Procedure: BIOPSY;  Surgeon: Marguerita Merles, Reuel Boom, MD;  Location: [facility];  Service: Gastroenterology;;   Fidela Salisbury RELEASE Bilateral 2006   ESOPHAGOGASTRODUODENOSCOPY (EGD) WITH PROPOFOL N/A 01/22/2022   Procedure: ESOPHAGOGASTRODUODENOSCOPY (EGD) WITH PROPOFOL;  Surgeon:  Dolores Frame, MD;  Location: [facility];  Service: Gastroenterology;  Laterality: N/A;  205   hysteroscopy     age 30   mastectomy Bilateral 10/22/2019   pt had necrosis after 1st surgery, had another surgery to remove implants 12/10/2019   MOUTH SURGERY     TUBAL LIGATION     TUBAL LIGATION Bilateral 1998   WISDOM TOOTH EXTRACTION     early 47's   Social History   Tobacco Use   Smoking status: Every Day    Current packs/day: 0.50    Average packs/day: 0.5 packs/day for 40.0 years (20.0 ttl pk-yrs)    Types: Cigarettes   Smokeless tobacco: Never   Tobacco comments:    Smokes half a pack a day for 40 years.   Vaping Use   Vaping status: Never Used  Substance Use Topics   Alcohol use: Never   Drug use: Never   Family History  Problem Relation Age of Onset   Leukemia Mother    Breast cancer Sister    Diabetes Maternal Uncle    Hyperlipidemia Maternal Uncle    Diabetes Mellitus II Maternal Grandmother    Heart disease Maternal Grandfather    Diabetes Maternal Grandfather    Breast cancer Cousin    Breast cancer Half-Sister    Osteoporosis Half-Sister    Lupus Half-Sister  Lupus Half-Sister    Osteoporosis Half-Sister    Cancer - Lung Neg Hx    Colon cancer Neg Hx    Allergies  Allergen Reactions   Augmentin [Amoxicillin-Pot Clavulanate] Anaphylaxis   Ciprofloxacin Anaphylaxis   Claritin [Loratadine] Anaphylaxis   Gabapentin Anaphylaxis   Keflex [Cephalexin] Anaphylaxis   Levofloxacin Anaphylaxis   Lisinopril Swelling   Lisinopril Anaphylaxis   Metoprolol Swelling   Metoprolol Anaphylaxis   Nexium [Esomeprazole] Anaphylaxis   Sulfa Antibiotics Hives and Swelling   Tetracyclines & Related Anaphylaxis   Iohexol Hives    Needs pre meds   Ultram [Tramadol] Other (See Comments)    Patient states medication irritates throat    Ceftin [Cefuroxime Axetil] Hives   Chromium Other (See Comments)    headache   Iodinated Contrast Media Hives   Misc.  Sulfonamide Containing Compounds Other (See Comments)   Oxycodone Itching   Sulfa Antibiotics     Possible Reaction    Latex Rash    itching   Nickel Rash    itching   Review of Systems  Constitutional:  Negative for chills and fever.  HENT:  Negative for sore throat.   Respiratory:  Negative for cough and shortness of breath.   Cardiovascular:  Positive for leg swelling. Negative for chest pain and palpitations.  Gastrointestinal:  Negative for abdominal pain, blood in stool, constipation, diarrhea, nausea and vomiting.  Genitourinary:  Negative for dysuria and hematuria.  Musculoskeletal:  Negative for myalgias.  Skin:  Negative for itching and rash.  Neurological:  Negative for dizziness and headaches.  Psychiatric/Behavioral:  Negative for depression and suicidal ideas.      Objective:     BP 123/86 (BP Location: Right Arm, Patient Position: Sitting, Cuff Size: Normal)   Pulse 75   Ht 5\' 4"  (1.626 m)   Wt 188 lb 9.6 oz (85.5 kg)   LMP 06/08/2012   SpO2 97%   BMI 32.37 kg/m  BP Readings from Last 3 Encounters:  09/19/23 123/86  09/09/23 (!) 141/74  09/02/23 118/71    Physical Exam Vitals reviewed.  Constitutional:      General: She is not in acute distress.    Appearance: Normal appearance. She is obese. She is not toxic-appearing.  HENT:     Head: Normocephalic and atraumatic.     Right Ear: External ear normal.     Left Ear: External ear normal.     Nose: Nose normal. No congestion or rhinorrhea.     Mouth/Throat:     Mouth: Mucous membranes are moist.     Pharynx: Oropharynx is clear. No oropharyngeal exudate or posterior oropharyngeal erythema.  Eyes:     General: No scleral icterus.    Extraocular Movements: Extraocular movements intact.     Conjunctiva/sclera: Conjunctivae normal.     Pupils: Pupils are equal, round, and reactive to light.  Cardiovascular:     Rate and Rhythm: Normal rate and regular rhythm.     Pulses: Normal pulses.     Heart  sounds: Normal heart sounds. No murmur heard.    No friction rub. No gallop.  Pulmonary:     Effort: Pulmonary effort is normal.     Breath sounds: Normal breath sounds. No wheezing, rhonchi or rales.  Abdominal:     General: Abdomen is flat. Bowel sounds are normal. There is no distension.     Palpations: Abdomen is soft.     Tenderness: There is no abdominal tenderness.  Musculoskeletal:  General: No swelling. Normal range of motion.     Cervical back: Normal range of motion.     Right lower leg: Edema present.     Left lower leg: Edema present.     Comments: Compression sleeve on left arm  Lymphadenopathy:     Cervical: No cervical adenopathy.  Skin:    General: Skin is warm and dry.     Capillary Refill: Capillary refill takes less than 2 seconds.     Coloration: Skin is not jaundiced.  Neurological:     General: No focal deficit present.     Mental Status: She is alert and oriented to person, place, and time.  Psychiatric:        Mood and Affect: Mood normal.        Behavior: Behavior normal.   Last CBC Lab Results  Component Value Date   WBC 3.3 (L) 09/09/2023   HGB 14.0 09/09/2023   HCT 41.0 09/09/2023   MCV 105.7 (H) 09/09/2023   MCH 36.1 (H) 09/09/2023   RDW 13.6 09/09/2023   PLT 366 09/09/2023   Last metabolic panel Lab Results  Component Value Date   GLUCOSE 108 (H) 09/09/2023   NA 138 09/09/2023   K 3.8 09/09/2023   CL 106 09/09/2023   CO2 23 09/09/2023   BUN 13 09/09/2023   CREATININE 1.00 09/09/2023   GFRNONAA >60 09/09/2023   CALCIUM 9.2 09/09/2023   PROT 7.8 09/09/2023   ALBUMIN 4.3 09/09/2023   BILITOT 0.8 09/09/2023   ALKPHOS 65 09/09/2023   AST 27 09/09/2023   ALT 36 09/09/2023   ANIONGAP 9 09/09/2023   Last lipids Lab Results  Component Value Date   CHOL 225 (H) 06/10/2023   HDL 53 06/10/2023   LDLCALC 130 (H) 06/10/2023   TRIG 212 (H) 06/10/2023   CHOLHDL 4.2 06/10/2023   Last hemoglobin A1c Lab Results  Component Value  Date   HGBA1C 5.3 06/10/2023   Last thyroid functions Lab Results  Component Value Date   TSH 1.584 06/10/2023   Last vitamin D Lab Results  Component Value Date   VD25OH 29.88 (L) 06/10/2023   Last vitamin B12 and Folate Lab Results  Component Value Date   VITAMINB12 657 06/10/2023   FOLATE 16.8 06/10/2023   The 10-year ASCVD risk score (Arnett DK, et al., 2019) is: 10.2%    Assessment & Plan:   Problem List Items Addressed This Visit       Essential hypertension    Remains adequately controlled on current antihypertensive regimen.  No medication changes are indicated today.      Dysphagia    Seen by gastroenterology in July for follow-up of chronic dysphagia.  Lansoprazole has been increased back to twice daily.  She has been referred back to [facility] for repeat EGD with Endoflip.  This will take place in November.  Symptoms have improved since increasing the frequency of lansoprazole.      Carpal tunnel syndrome of right wrist    She previously endorsed pain in the right wrist similar to what she has experienced with carpal tunnel syndrome.  Underwent bilateral carpal tunnel release in 2006.  Symptoms in the right wrist have improved since using a wrist brace.  No further treatment is indicated today.      Hyperlipidemia    Lipid panel updated in June.  Total cholesterol 225 and LDL 130.  Her 10-year ASCVD risk today is 10.2%.  For now, she will continue to focus on dietary  changes in an effort to improve her cholesterol.  We reviewed the Mediterranean diet and she was provided with information at discharge.      Bilateral swelling of feet - Primary    Noted on exam today.  Attributed to medication side effect (ribociclib).  She is prescribed Lasix 20 mg daily as needed for symptom relief.  Use of compression stockings and leg elevation has also been recommended.      Return in about 6 months (around 03/18/2024).   Billie Lade, MD

## 2023-09-20 ENCOUNTER — Other Ambulatory Visit: Payer: Self-pay

## 2023-09-22 ENCOUNTER — Other Ambulatory Visit: Payer: Self-pay

## 2023-09-23 ENCOUNTER — Inpatient Hospital Stay: Payer: Medicaid Other

## 2023-09-29 DIAGNOSIS — M7989 Other specified soft tissue disorders: Secondary | ICD-10-CM | POA: Insufficient documentation

## 2023-09-29 NOTE — Assessment & Plan Note (Signed)
 Remains adequately controlled on current antihypertensive regimen.  No medication changes are indicated today.

## 2023-09-29 NOTE — Assessment & Plan Note (Signed)
Lipid panel updated in June.  Total cholesterol 225 and LDL 130.  Her 10-year ASCVD risk today is 10.2%.  For now, she will continue to focus on dietary changes in an effort to improve her cholesterol.  We reviewed the Mediterranean diet and she was provided with information at discharge.

## 2023-09-29 NOTE — Assessment & Plan Note (Signed)
Noted on exam today.  Attributed to medication side effect (ribociclib).  She is prescribed Lasix 20 mg daily as needed for symptom relief.  Use of compression stockings and leg elevation has also been recommended.

## 2023-09-29 NOTE — Assessment & Plan Note (Signed)
Seen by gastroenterology in July for follow-up of chronic dysphagia.  Lansoprazole has been increased back to twice daily.  She has been referred back to [facility] for repeat EGD with Endoflip.  This will take place in November.  Symptoms have improved since increasing the frequency of lansoprazole.

## 2023-09-29 NOTE — Assessment & Plan Note (Signed)
She previously endorsed pain in the right wrist similar to what she has experienced with carpal tunnel syndrome.  Underwent bilateral carpal tunnel release in 2006.  Symptoms in the right wrist have improved since using a wrist brace.  No further treatment is indicated today.

## 2023-09-30 ENCOUNTER — Inpatient Hospital Stay: Payer: Medicaid Other

## 2023-09-30 ENCOUNTER — Inpatient Hospital Stay: Payer: Medicaid Other | Admitting: Hematology

## 2023-10-02 ENCOUNTER — Other Ambulatory Visit: Payer: Self-pay | Admitting: *Deleted

## 2023-10-02 DIAGNOSIS — L299 Pruritus, unspecified: Secondary | ICD-10-CM

## 2023-10-02 MED ORDER — METHYLPREDNISOLONE 4 MG PO TBPK
ORAL_TABLET | ORAL | 0 refills | Status: DC
Start: 2023-10-02 — End: 2023-10-21

## 2023-10-02 NOTE — Telephone Encounter (Signed)
Patient called with c/o of several days of severe itching without associated rash.  States that it feels like she is wrapped in fiberglass.  She has tried benadryl and creams without being effective.  Per Dr. Ellin Saba, Medrol Dose Pack sent in.  Patient aware and verbalized understanding.

## 2023-10-08 ENCOUNTER — Inpatient Hospital Stay: Payer: Medicaid Other

## 2023-10-08 ENCOUNTER — Inpatient Hospital Stay: Payer: Medicaid Other | Attending: Hematology

## 2023-10-08 ENCOUNTER — Other Ambulatory Visit: Payer: Self-pay

## 2023-10-08 ENCOUNTER — Inpatient Hospital Stay: Payer: Medicaid Other | Admitting: Hematology

## 2023-10-08 VITALS — BP 126/73 | HR 70 | Temp 98.3°F | Resp 18 | Ht 64.0 in | Wt 186.8 lb

## 2023-10-08 DIAGNOSIS — Z79811 Long term (current) use of aromatase inhibitors: Secondary | ICD-10-CM | POA: Insufficient documentation

## 2023-10-08 DIAGNOSIS — Z79899 Other long term (current) drug therapy: Secondary | ICD-10-CM | POA: Diagnosis not present

## 2023-10-08 DIAGNOSIS — Z9012 Acquired absence of left breast and nipple: Secondary | ICD-10-CM | POA: Insufficient documentation

## 2023-10-08 DIAGNOSIS — G629 Polyneuropathy, unspecified: Secondary | ICD-10-CM | POA: Insufficient documentation

## 2023-10-08 DIAGNOSIS — M8580 Other specified disorders of bone density and structure, unspecified site: Secondary | ICD-10-CM | POA: Insufficient documentation

## 2023-10-08 DIAGNOSIS — Z170 Estrogen receptor positive status [ER+]: Secondary | ICD-10-CM | POA: Diagnosis not present

## 2023-10-08 DIAGNOSIS — F17210 Nicotine dependence, cigarettes, uncomplicated: Secondary | ICD-10-CM | POA: Diagnosis not present

## 2023-10-08 DIAGNOSIS — Z803 Family history of malignant neoplasm of breast: Secondary | ICD-10-CM | POA: Diagnosis not present

## 2023-10-08 DIAGNOSIS — Z7952 Long term (current) use of systemic steroids: Secondary | ICD-10-CM | POA: Diagnosis not present

## 2023-10-08 DIAGNOSIS — M7989 Other specified soft tissue disorders: Secondary | ICD-10-CM | POA: Insufficient documentation

## 2023-10-08 DIAGNOSIS — C50919 Malignant neoplasm of unspecified site of unspecified female breast: Secondary | ICD-10-CM

## 2023-10-08 DIAGNOSIS — C7951 Secondary malignant neoplasm of bone: Secondary | ICD-10-CM | POA: Diagnosis not present

## 2023-10-08 DIAGNOSIS — C50912 Malignant neoplasm of unspecified site of left female breast: Secondary | ICD-10-CM | POA: Insufficient documentation

## 2023-10-08 DIAGNOSIS — R21 Rash and other nonspecific skin eruption: Secondary | ICD-10-CM | POA: Diagnosis not present

## 2023-10-08 DIAGNOSIS — Z7982 Long term (current) use of aspirin: Secondary | ICD-10-CM | POA: Insufficient documentation

## 2023-10-08 LAB — CBC WITH DIFFERENTIAL/PLATELET
Abs Immature Granulocytes: 0.02 10*3/uL (ref 0.00–0.07)
Basophils Absolute: 0.1 10*3/uL (ref 0.0–0.1)
Basophils Relative: 1 %
Eosinophils Absolute: 0.1 10*3/uL (ref 0.0–0.5)
Eosinophils Relative: 1 %
HCT: 41.9 % (ref 36.0–46.0)
Hemoglobin: 14.2 g/dL (ref 12.0–15.0)
Immature Granulocytes: 0 %
Lymphocytes Relative: 24 %
Lymphs Abs: 1.1 10*3/uL (ref 0.7–4.0)
MCH: 35.7 pg — ABNORMAL HIGH (ref 26.0–34.0)
MCHC: 33.9 g/dL (ref 30.0–36.0)
MCV: 105.3 fL — ABNORMAL HIGH (ref 80.0–100.0)
Monocytes Absolute: 0.3 10*3/uL (ref 0.1–1.0)
Monocytes Relative: 6 %
Neutro Abs: 3.2 10*3/uL (ref 1.7–7.7)
Neutrophils Relative %: 68 %
Platelets: 390 10*3/uL (ref 150–400)
RBC: 3.98 MIL/uL (ref 3.87–5.11)
RDW: 13.7 % (ref 11.5–15.5)
WBC: 4.7 10*3/uL (ref 4.0–10.5)
nRBC: 0 % (ref 0.0–0.2)

## 2023-10-08 LAB — COMPREHENSIVE METABOLIC PANEL
ALT: 32 U/L (ref 0–44)
AST: 29 U/L (ref 15–41)
Albumin: 4.2 g/dL (ref 3.5–5.0)
Alkaline Phosphatase: 65 U/L (ref 38–126)
Anion gap: 11 (ref 5–15)
BUN: 18 mg/dL (ref 8–23)
CO2: 23 mmol/L (ref 22–32)
Calcium: 9.7 mg/dL (ref 8.9–10.3)
Chloride: 104 mmol/L (ref 98–111)
Creatinine, Ser: 1.07 mg/dL — ABNORMAL HIGH (ref 0.44–1.00)
GFR, Estimated: 59 mL/min — ABNORMAL LOW (ref >60)
Glucose, Bld: 88 mg/dL (ref 70–99)
Potassium: 4.3 mmol/L (ref 3.5–5.1)
Sodium: 138 mmol/L (ref 135–145)
Total Bilirubin: 0.8 mg/dL (ref 0.3–1.2)
Total Protein: 7.5 g/dL (ref 6.5–8.1)

## 2023-10-08 LAB — MAGNESIUM: Magnesium: 2.1 mg/dL (ref 1.7–2.4)

## 2023-10-08 MED ORDER — DENOSUMAB 120 MG/1.7ML ~~LOC~~ SOLN
120.0000 mg | Freq: Once | SUBCUTANEOUS | Status: AC
  Administered 2023-10-08: 120 mg via SUBCUTANEOUS
  Filled 2023-10-08: qty 1.7

## 2023-10-08 NOTE — Progress Notes (Signed)
[facility] [address], [place] [address]    Clinic Day:  10/08/2023  Referring physician: Billie Lade, MD  Patient Care Team: Billie Lade, MD as PCP - General (Internal Medicine) [name], RN as Oncology Nurse Navigator (Oncology) [name], MD as Medical Oncologist (Medical Oncology)   ASSESSMENT & PLAN:   Assessment: 1.  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 - Started on Ibrance and Faslodex in December 2021 for metastatic disease. - PET scan on 08/10/2021 with no evidence of hypermetabolic metastatic disease.  Widespread scattered sclerotic bone metastasis in the spine and pelvis are stable and not metabolically active.  No lymphadenopathy.  Asymmetric muscular uptake in the left lower scalene muscles and in the intercostal muscles of the ventral upper left chest wall without discrete mass correlate on the CT images, favoring activity related uptake. [name] was dose reduced to 100 mg 3 weeks on/1 week off on 10/11/2021 due to fatigue.  Discontinued on 07/23/2023 due to progression - Ribociclib and exemestane started on 08/07/2023  2.  Social/family history: - She is a retired Engineer, structural.  She recently moved back from [place]. - Younger sister had breast cancer in her 97s.  Mother died of leukemia at age 78.  Paternal cousin also had breast cancer.  3.  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  She underwent immediate implants which were later removed on 12/10/2019 secondary to infection and necrosis. - XRT to the chest wall and regional lymph nodes, 60 Gray, from 02/29/2020 through 04/12/2020 - She was treated with adjuvant anastrozole which was started 12/09/2019 and was found to have metastatic disease in November 2021. - Previous genetic testing was reportedly negative and [place].    Plan: 1.  Metastatic breast cancer to the bones, ER/PR positive, HER2 negative: - PET scan on 07/17/2023: Intense activity in the T7 and T9 vertebral bodies.  No metabolic activity in the T8 or L4 vertebral bodies. - She is tolerating ribociclib and exemestane reasonably well.  She has some hot flashes and thinning of hair.  She had an episode of itching and stinging from head to toe last week without rash.  We gave her Medrol Dosepak which has helped.  She no longer has those symptoms. - Reviewed labs today: Normal LFTs.  Creatinine mildly elevated at 1.07 and stable.  CBC grossly normal.  Last CA 15-3 and CA 27-29 have decreased to 32 and 40 respectively. - Continue ribociclib 3 weeks on/1 week of and exemestane daily.  Recommend follow-up in 4 weeks.  We will do a PET CT scan and repeat CA 15-3 and CA 27-29.  2.  Peripheral neuropathy: - Constant tingling and numbness in the hands and feet is stable.  3.  Bone metastatic disease: - Continue denosumab monthly.  Calcium is 9.7 today.  Continue calcium and vitamin D supplements.  4.  Leg swellings: - She has mild fluid retention in the legs since  ribociclib started. - I have started her on Lasix at last visit which is helping.  She has very mild swelling today.  Continue Lasix as needed.   No orders of the defined types were placed in this encounter.     [name], MD   10/9/20241:49 PM  CHIEF COMPLAINT:   Diagnosis: metastatic breast cancer    Cancer  Staging  No matching staging information was found for the patient.    Prior Therapy: Palbociclib and fulvestrant  Current Therapy: Ribociclib and exemestane every 4 weeks    HISTORY OF PRESENT ILLNESS:   Oncology History  Primary malignant neoplasm of breast with metastasis (HCC)  07/06/2021 Initial Diagnosis   Metastatic breast cancer (HCC)   07/18/2021 - 08/15/2022 Chemotherapy   Patient is on Treatment Plan : BREAST Palbociclib + Fulvestrant q28d     07/18/2021 -  Chemotherapy   Patient is on Treatment Plan : BREAST Palbociclib D1-21 + Fulvestrant q28d        INTERVAL HISTORY:   Wendy Zamora is a 62 y.o. female seen for follow-up of metastatic breast cancer.  She reports that she is taking ribociclib and exemestane daily.  She reports some thinning of hair and hot flashes.  She is taking Lasix for leg swellings.  Energy levels are 50%.  Appetite is 85%.  PAST MEDICAL HISTORY:   Past Medical History: Past Medical History:  Diagnosis Date   Allergy    Asthma    Asthma    diagnosed age 32   Bone metastasis 08/2020   was on prolia   Breast cancer (HCC)    started chemo 05/05/2019. finished 08/2019   Gangrene (HCC)    GERD (gastroesophageal reflux disease)    Hx of bilateral mastectomy 10/22/2019   Hypertension    Hypertension    started bp meds 2008   Lymphedema of arm    left arm   Osteopenia    Shingles 04/20/2020   left side    Surgical History: Past Surgical History:  Procedure Laterality Date   BIOPSY  01/22/2022   Procedure: BIOPSY;  Surgeon: Marguerita Merles, Reuel Boom, MD;  Location: [facility];  Service: Gastroenterology;;   Fidela Salisbury RELEASE Bilateral 2006   ESOPHAGOGASTRODUODENOSCOPY (EGD) WITH PROPOFOL N/A 01/22/2022   Procedure: ESOPHAGOGASTRODUODENOSCOPY (EGD) WITH PROPOFOL;  Surgeon: Dolores Frame, MD;  Location: [facility];  Service: Gastroenterology;  Laterality: N/A;  205   hysteroscopy     age 46   mastectomy Bilateral 10/22/2019    pt had necrosis after 1st surgery, had another surgery to remove implants 12/10/2019   MOUTH SURGERY     TUBAL LIGATION     TUBAL LIGATION Bilateral 1998   WISDOM TOOTH EXTRACTION     early 39's    Social History: Social History   Socioeconomic History   Marital status: Married    Spouse name: Not on file   Number of children: 2   Years of education: Not on file   Highest education level: GED or equivalent  Occupational History   Occupation: Disability  Tobacco Use   Smoking status: Every Day    Current packs/day: 0.50    Average packs/day: 0.5 packs/day for 40.0 years (20.0 ttl pk-yrs)    Types: Cigarettes   Smokeless tobacco: Never   Tobacco comments:    Smokes half a pack a day for 40 years.   Vaping Use   Vaping status: Never Used  Substance and Sexual Activity   Alcohol use: Never  Drug use: Never   Sexual activity: Yes  Other Topics Concern   Not on file  Social History Narrative   Lives with her husband.    Social Determinants of Health   Financial Resource Strain: Medium Risk (06/06/2023)   Overall Financial Resource Strain (CARDIA)    Difficulty of Paying Living Expenses: Somewhat hard  Food Insecurity: Food Insecurity Present (06/06/2023)   Hunger Vital Sign    Worried About Running Out of Food in the Last Year: Sometimes true    Ran Out of Food in the Last Year: Sometimes true  Transportation Needs: No Transportation Needs (06/06/2023)   PRAPARE - Administrator, Civil Service (Medical): No    Lack of Transportation (Non-Medical): No  Physical Activity: Insufficiently Active (06/06/2023)   Exercise Vital Sign    Days of Exercise per Week: 2 days    Minutes of Exercise per Session: 20 min  Stress: Stress Concern Present (06/06/2023)   [employer] of Occupational Health - Occupational Stress Questionnaire    Feeling of Stress : Rather much  Social Connections: Unknown (06/06/2023)   Social Connection and Isolation Panel [NHANES]     Frequency of Communication with Friends and Family: Twice a week    Frequency of Social Gatherings with Friends and Family: Once a week    Attends Religious Services: Patient declined    Database administrator or Organizations: No    Attends Banker Meetings: Not on file    Marital Status: Married  Intimate Partner Violence: Not At Risk (03/04/2022)   Received from [facility]   Humiliation, Afraid, Rape, and Kick questionnaire    Fear of Current or Ex-Partner: No    Emotionally Abused: No    Physically Abused: No    Sexually Abused: No    Family History: Family History  Problem Relation Age of Onset   Leukemia Mother    Breast cancer Sister    Diabetes Maternal Uncle    Hyperlipidemia Maternal Uncle    Diabetes Mellitus II Maternal Grandmother    Heart disease Maternal Grandfather    Diabetes Maternal Grandfather    Breast cancer Cousin    Breast cancer Half-Sister    Osteoporosis Half-Sister    Lupus Half-Sister    Lupus Half-Sister    Osteoporosis Half-Sister    Cancer - Lung Neg Hx    Colon cancer Neg Hx     Current Medications:  Current Outpatient Medications:    amLODipine (NORVASC) 5 MG tablet, Take 1 tablet (5 mg total) by mouth daily., Disp: 90 tablet, Rfl: 2   Ascorbic Acid (VITAMIN C) 1000 MG tablet, Take 1,000 mg by mouth daily., Disp: , Rfl:    aspirin 81 MG EC tablet, Take 81 mg by mouth daily., Disp: , Rfl:    butalbital-acetaminophen-caffeine (FIORICET) 50-325-40 MG tablet, Take 1 tablet by mouth every 4 (four) hours as needed for headache., Disp: 30 tablet, Rfl: 0   Calcium-Magnesium-Vitamin D (CALCIUM 1200+D3 PO), Take 1 tablet by mouth daily., Disp: , Rfl:    cetirizine-pseudoephedrine (ZYRTEC-D) 5-120 MG tablet, Take 1 tablet by mouth daily., Disp: , Rfl:    CINNAMON PO, Take 1,000 mg by mouth daily., Disp: , Rfl:    cyanocobalamin 1000 MCG tablet, Take 1 tablet (1,000 mcg total) by  mouth daily. (Patient taking differently: Take 1,000 mcg by mouth every other day.), Disp: 30 tablet, Rfl: 6   cyclobenzaprine (FLEXERIL) 10 MG tablet,  Take 1 tablet (10 mg total) by mouth 2 (two) times daily as needed for muscle spasms., Disp: 60 tablet, Rfl: 5   denosumab (XGEVA) 120 MG/1.7ML SOLN injection, Inject 120 mg into the skin every 30 (thirty) days., Disp: , Rfl:    dexamethasone (DECADRON) 0.5 MG/5ML solution, Take 0.5 mg by mouth daily., Disp: , Rfl:    dexamethasone 0.5 mg/5 mL - diphenhydrAMINE 12.5 mg/5 mL oral solution 2:1 mixture, Take 10 mLs by mouth 2 (two) times daily., Disp: 480 mL, Rfl: 2   exemestane (AROMASIN) 25 MG tablet, Take 1 tablet (25 mg total) by mouth daily after breakfast., Disp: 90 tablet, Rfl: 1   furosemide (LASIX) 20 MG tablet, Take 1 tablet (20 mg total) by mouth daily as needed for edema (Take daily as needed for edema)., Disp: 30 tablet, Rfl: 0   HYDROcodone-acetaminophen (NORCO) 5-325 MG tablet, Take 1 tablet by mouth every 12 (twelve) hours as needed for moderate pain., Disp: 30 tablet, Rfl: 0   lansoprazole (PREVACID) 30 MG capsule, TAKE 1 CAPSULE BY MOUTH IN THE MORNING AND AT BEDTIME, Disp: 120 capsule, Rfl: 3   methylPREDNISolone (MEDROL DOSEPAK) 4 MG TBPK tablet, Take 6 tabs day 1, 5 tabs day 2 and reduce by 1 tablet each day until completed, Disp: 21 tablet, Rfl: 0   Misc. Devices MISC, Please provide patient with left arm compression sleeve. Diagnosis metastatic breast cancer, bilateral mastectomy, left arm lymphedema, Disp: 1 each, Rfl: 0   NAPHCON-A 0.025-0.3 % ophthalmic solution, Place 2 drops into both eyes in the morning and at bedtime., Disp: , Rfl:    ribociclib succ (KISQALI, 600 MG DOSE,) 200 MG Therapy Pack, Take 3 tablets (600 mg total) by mouth daily. Take for 21 days on, 7 days off, repeat every 28 days., Disp: 63 tablet, Rfl: 3 No current facility-administered medications for this visit.  Facility-Administered Medications Ordered in  Other Visits:    denosumab (XGEVA) injection 120 mg, 120 mg, Subcutaneous, Once, [name], MD   Allergies: Allergies  Allergen Reactions   Augmentin [Amoxicillin-Pot Clavulanate] Anaphylaxis   Ciprofloxacin Anaphylaxis   Claritin [Loratadine] Anaphylaxis   Gabapentin Anaphylaxis   Keflex [Cephalexin] Anaphylaxis   Levofloxacin Anaphylaxis   Lisinopril Swelling   Lisinopril Anaphylaxis   Metoprolol Swelling   Metoprolol Anaphylaxis   Nexium [Esomeprazole] Anaphylaxis   Sulfa Antibiotics Hives and Swelling   Tetracyclines & Related Anaphylaxis   Iohexol Hives    Needs pre meds   Ultram [Tramadol] Other (See Comments)    Patient states medication irritates throat    Ceftin [Cefuroxime Axetil] Hives   Chromium Other (See Comments)    headache   Iodinated Contrast Media Hives   Misc. Sulfonamide Containing Compounds Other (See Comments)   Oxycodone Itching   Sulfa Antibiotics     Possible Reaction    Latex Rash    itching   Nickel Rash    itching    REVIEW OF SYSTEMS:   Review of Systems  Constitutional:  Negative for fatigue and fever.  HENT:   Negative for lump/mass, mouth sores, nosebleeds and sore throat.   Eyes:  Negative for eye problems.  Respiratory:  Positive for cough and shortness of breath.   Cardiovascular:  Positive for palpitations. Negative for chest pain.  Gastrointestinal:  Positive for constipation and nausea. Negative for abdominal pain, diarrhea and vomiting.  Endocrine: Positive for hot flashes.  Genitourinary:  Negative for bladder incontinence, difficulty urinating, dysuria, frequency, hematuria and nocturia.   Musculoskeletal:  Positive for arthralgias (generalized, 6/10 severity). Negative for back pain, flank pain, myalgias and neck pain.  Skin:  Negative for itching and rash.  Neurological:  Positive for numbness (in hands and feet). Negative for dizziness.  Hematological:  Does not bruise/bleed easily.  Psychiatric/Behavioral:   Positive for sleep disturbance. Negative for depression and suicidal ideas. The patient is nervous/anxious.   All other systems reviewed and are negative.    VITALS:   Blood pressure 126/73, pulse 70, temperature 98.3 F (36.8 C), temperature source Oral, resp. rate 18, height 5\' 4"  (1.626 m), weight 186 lb 12.8 oz (84.7 kg), last menstrual period 06/08/2012, SpO2 99%.  Wt Readings from Last 3 Encounters:  10/08/23 186 lb 12.8 oz (84.7 kg)  09/19/23 188 lb 9.6 oz (85.5 kg)  09/09/23 188 lb 14.4 oz (85.7 kg)    Body mass index is 32.06 kg/m.  Performance status (ECOG): 1 - Symptomatic but completely ambulatory  PHYSICAL EXAM:   Physical Exam Vitals and nursing note reviewed. Exam conducted with a chaperone present.  Constitutional:      Appearance: Normal appearance.  Cardiovascular:     Rate and Rhythm: Normal rate and regular rhythm.     Pulses: Normal pulses.     Heart sounds: Normal heart sounds.  Pulmonary:     Effort: Pulmonary effort is normal.     Breath sounds: Normal breath sounds.  Abdominal:     Palpations: Abdomen is soft. There is no hepatomegaly, splenomegaly or mass.     Tenderness: There is no abdominal tenderness.  Musculoskeletal:     Right lower leg: Edema present.     Left lower leg: Edema present.  Lymphadenopathy:     Cervical: No cervical adenopathy.     Right cervical: No superficial, deep or posterior cervical adenopathy.    Left cervical: No superficial, deep or posterior cervical adenopathy.     Upper Body:     Right upper body: No supraclavicular or axillary adenopathy.     Left upper body: No supraclavicular or axillary adenopathy.  Skin:    Comments: +5 mm brown lesion in the mid back that is raised and rounded area, nonerythematous, and tender to touch    Neurological:     General: No focal deficit present.     Mental Status: She is alert and oriented to person, place, and time.  Psychiatric:        Mood and Affect: Mood normal.         Behavior: Behavior normal.     LABS:      Latest Ref Rng & Units 10/08/2023   12:19 PM 09/09/2023   12:05 PM 08/25/2023   12:44 PM  CBC  WBC 4.0 - 10.5 K/uL 4.7  3.3  2.6   Hemoglobin 12.0 - 15.0 g/dL 16.1  09.6  04.5   Hematocrit 36.0 - 46.0 % 41.9  41.0  41.0   Platelets 150 - 400 K/uL 390  366  246       Latest Ref Rng & Units 10/08/2023   12:19 PM 09/09/2023   12:05 PM 08/25/2023   12:44 PM  CMP  Glucose 70 - 99 mg/dL 88  409  99   BUN 8 - 23 mg/dL 18  13  15    Creatinine 0.44 - 1.00 mg/dL 8.11  9.14  7.82   Sodium 135 - 145 mmol/L 138  138  137   Potassium 3.5 - 5.1 mmol/L 4.3  3.8  4.1   Chloride 98 -  111 mmol/L 104  106  105   CO2 22 - 32 mmol/L 23  23  22    Calcium 8.9 - 10.3 mg/dL 9.7  9.2  9.6   Total Protein 6.5 - 8.1 g/dL 7.5  7.8  7.8   Total Bilirubin 0.3 - 1.2 mg/dL 0.8  0.8  0.8   Alkaline Phos 38 - 126 U/L 65  65  60   AST 15 - 41 U/L 29  27  32   ALT 0 - 44 U/L 32  36  44      No results found for: "CEA1", "CEA" / No results found for: "CEA1", "CEA" No results found for: "PSA1" No results found for: "UJW119" No results found for: "CAN125"  No results found for: "TOTALPROTELP", "ALBUMINELP", "A1GS", "A2GS", "BETS", "BETA2SER", "GAMS", "MSPIKE", "SPEI" No results found for: "TIBC", "FERRITIN", "IRONPCTSAT" No results found for: "LDH"   STUDIES:   No results found.

## 2023-10-08 NOTE — Progress Notes (Signed)
Patient tolerated injection with no complaints voiced.  Site clean and dry with no bruising or swelling noted at site.  See MAR for details.  Band aid applied.  Patient stable during and after injection.  Vss with discharge and left in satisfactory condition with no s/s of distress noted.  

## 2023-10-08 NOTE — Patient Instructions (Signed)

## 2023-10-08 NOTE — Progress Notes (Signed)
Patient has been assessed, vital signs and labs have been reviewed by Dr. Katragadda. ANC, Creatinine, LFTs, and Platelets are within treatment parameters per Dr. Katragadda. The patient is good to proceed with treatment at this time. Primary RN and pharmacy aware.  

## 2023-10-08 NOTE — Patient Instructions (Addendum)
[facility]  Discharge Instructions  You were seen and examined today by Dr. Ellin Saba.  Dr. Ellin Saba discussed your most recent lab work which revealed that everything looks good and stable.  Dr. Ellin Saba will do a PET scan and repeat labs before your next appointment.  Follow-up as scheduled in 4 weeks.    Thank you for choosing [facility] to provide your oncology and hematology care.   To afford each patient quality time with our provider, please arrive at least 15 minutes before your scheduled appointment time. You may need to reschedule your appointment if you arrive late (10 or more minutes). Arriving late affects you and other patients whose appointments are after yours.  Also, if you miss three or more appointments without notifying the office, you may be dismissed from the clinic at the provider's discretion.    Again, thank you for choosing [facility].  Our hope is that these requests will decrease the amount of time that you wait before being seen by our physicians.   If you have a lab appointment with the Cancer Center - please note that after April 8th, all labs will be drawn in the cancer center.  You do not have to check in or register with the main entrance as you have in the past but will complete your check-in at the cancer center.            _____________________________________________________________  Should you have questions after your visit to [facility], please contact our office at [phone] and follow the prompts.  Our office hours are 8:00 a.m. to 4:30 p.m. Monday - Thursday and 8:00 a.m. to 2:30 p.m. Friday.  Please note that voicemails left after 4:00 p.m. may not be returned until the following business day.  We are closed weekends and all major holidays.  You do have access to a nurse 24-7, just call the main number to the clinic [phone] and do not press any  options, hold on the line and a nurse will answer the phone.    For prescription refill requests, have your pharmacy contact our office and allow 72 hours.    Masks are no longer required in the cancer centers. If you would like for your care team to wear a mask while they are taking care of you, please let them know. You may have one support person who is at least 61 years old accompany you for your appointments.

## 2023-10-09 ENCOUNTER — Encounter: Payer: Self-pay | Admitting: Hematology

## 2023-10-14 ENCOUNTER — Other Ambulatory Visit (HOSPITAL_COMMUNITY): Payer: Self-pay

## 2023-10-14 ENCOUNTER — Other Ambulatory Visit (HOSPITAL_COMMUNITY): Payer: Self-pay | Admitting: Pharmacy Technician

## 2023-10-14 NOTE — Progress Notes (Signed)
Specialty Pharmacy Refill Coordination Note  Wendy Zamora is a 62 y.o. female contacted today regarding refills of specialty medication(s) Ribociclib Succinate   Patient requested Delivery   Delivery date: 10/20/23   Verified address: [address] [place]   Medication will be filled on 10/16/20.

## 2023-10-17 ENCOUNTER — Inpatient Hospital Stay (HOSPITAL_BASED_OUTPATIENT_CLINIC_OR_DEPARTMENT_OTHER): Payer: Medicaid Other | Admitting: Oncology

## 2023-10-17 VITALS — BP 149/74 | HR 78 | Temp 98.2°F | Resp 16 | Wt 187.2 lb

## 2023-10-17 DIAGNOSIS — M7989 Other specified soft tissue disorders: Secondary | ICD-10-CM | POA: Diagnosis not present

## 2023-10-17 DIAGNOSIS — M8580 Other specified disorders of bone density and structure, unspecified site: Secondary | ICD-10-CM | POA: Diagnosis not present

## 2023-10-17 DIAGNOSIS — G629 Polyneuropathy, unspecified: Secondary | ICD-10-CM | POA: Diagnosis not present

## 2023-10-17 DIAGNOSIS — Z79811 Long term (current) use of aromatase inhibitors: Secondary | ICD-10-CM | POA: Diagnosis not present

## 2023-10-17 DIAGNOSIS — Z7982 Long term (current) use of aspirin: Secondary | ICD-10-CM | POA: Diagnosis not present

## 2023-10-17 DIAGNOSIS — Z9012 Acquired absence of left breast and nipple: Secondary | ICD-10-CM | POA: Diagnosis not present

## 2023-10-17 DIAGNOSIS — C50919 Malignant neoplasm of unspecified site of unspecified female breast: Secondary | ICD-10-CM | POA: Diagnosis not present

## 2023-10-17 DIAGNOSIS — Z803 Family history of malignant neoplasm of breast: Secondary | ICD-10-CM | POA: Diagnosis not present

## 2023-10-17 DIAGNOSIS — F17210 Nicotine dependence, cigarettes, uncomplicated: Secondary | ICD-10-CM | POA: Diagnosis not present

## 2023-10-17 DIAGNOSIS — R21 Rash and other nonspecific skin eruption: Secondary | ICD-10-CM

## 2023-10-17 DIAGNOSIS — Z170 Estrogen receptor positive status [ER+]: Secondary | ICD-10-CM | POA: Diagnosis not present

## 2023-10-17 DIAGNOSIS — C7951 Secondary malignant neoplasm of bone: Secondary | ICD-10-CM | POA: Diagnosis not present

## 2023-10-17 DIAGNOSIS — Z79899 Other long term (current) drug therapy: Secondary | ICD-10-CM | POA: Diagnosis not present

## 2023-10-17 DIAGNOSIS — C50912 Malignant neoplasm of unspecified site of left female breast: Secondary | ICD-10-CM | POA: Diagnosis not present

## 2023-10-17 DIAGNOSIS — Z7952 Long term (current) use of systemic steroids: Secondary | ICD-10-CM | POA: Diagnosis not present

## 2023-10-17 MED ORDER — CLINDAMYCIN PHOSPHATE 1 % EX GEL
Freq: Two times a day (BID) | CUTANEOUS | 0 refills | Status: DC
Start: 2023-10-17 — End: 2023-10-21

## 2023-10-17 MED ORDER — PREDNISONE 20 MG PO TABS
40.0000 mg | ORAL_TABLET | Freq: Every day | ORAL | 0 refills | Status: DC
Start: 2023-10-17 — End: 2023-10-21

## 2023-10-17 NOTE — Progress Notes (Signed)
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 Wendy Zamora is a 62 year old female who is followed by Dr. Ellin Zamora for breast cancer with metastatic disease to the bone.  She was started on Ibrance and Faslodex in December 2021 for metastatic disease and ultimately stopped on 07/23/2023 due to progression.  She was started on ribociclib and exemestane on 08/07/2023.  She appears to be tolerating exemestane and Ribociclib (3 weeks on 1 week off ) well with only some hot flashes and thinning of her hair.  She had an episode of itching and stinging from head to toe last week without rash.  She was given a Medrol Dosepak which helped.  Interval history--she presents to symptom management today for complaints of a rash to bilateral legs that is red, warm with associated itching and burning.  She has 1 more week of Ribociclib before her week off.   Reports swelling and rash started yesterday and worsened in the evening.  States her legs are already swollen but they felt very tight, hot and painful.  She took 2/25 mg Benadryl at bedtime with some improvement of her symptoms this morning.  Denies any fevers.  Reports allergies to most antibiotics.   ECOG FS:1 - Symptomatic but completely ambulatory  Review of systems- Review of Systems  Constitutional:  Positive for malaise/fatigue.  Respiratory:  Positive for cough and shortness of  breath.   Cardiovascular:  Positive for palpitations.  Gastrointestinal:  Positive for constipation and nausea.  Skin:  Positive for rash (Bilateral lower extremities).  Neurological:  Positive for sensory change and headaches.     Current treatment-3 weeks on 1 week off Ribociclib and exemestane.    Allergies  Allergen Reactions   Augmentin [Amoxicillin-Pot Clavulanate] Anaphylaxis   Ciprofloxacin Anaphylaxis   Claritin [Loratadine] Anaphylaxis   Gabapentin Anaphylaxis   Keflex [Cephalexin] Anaphylaxis   Levofloxacin Anaphylaxis   Lisinopril Swelling   Lisinopril Anaphylaxis   Metoprolol Swelling   Metoprolol Anaphylaxis   Nexium [Esomeprazole] Anaphylaxis   Sulfa Antibiotics Hives and Swelling   Tetracyclines & Related Anaphylaxis   Iohexol Hives    Needs pre meds   Ultram [Tramadol] Other (See Comments)    Patient states medication irritates throat    Ceftin [Cefuroxime Axetil] Hives   Chromium Other (See Comments)    headache   Iodinated Contrast Media Hives   Misc. Sulfonamide Containing Compounds Other (See Comments)   Oxycodone Itching   Sulfa Antibiotics     Possible Reaction    Latex Rash    itching   Nickel Rash    itching     Past Medical History:  Diagnosis Date   Allergy    Asthma    Asthma    diagnosed age 73   Bone metastasis 08/2020   was on prolia   Breast cancer (HCC)    started chemo 05/05/2019. finished 08/2019   Gangrene (HCC)    GERD (gastroesophageal reflux disease)    Hx of bilateral mastectomy 10/22/2019  Hypertension    Hypertension    started bp meds 2008   Lymphedema of arm    left arm   Osteopenia    Shingles 04/20/2020   left side     Past Surgical History:  Procedure Laterality Date   BIOPSY  01/22/2022   Procedure: BIOPSY;  Surgeon: Marguerita Merles, Reuel Boom, MD;  Location: [facility];  Service: Gastroenterology;;   CARPAL TUNNEL RELEASE Bilateral 2006   ESOPHAGOGASTRODUODENOSCOPY (EGD) WITH PROPOFOL N/A 01/22/2022    Procedure: ESOPHAGOGASTRODUODENOSCOPY (EGD) WITH PROPOFOL;  Surgeon: Dolores Frame, MD;  Location: [facility];  Service: Gastroenterology;  Laterality: N/A;  205   hysteroscopy     age 38   mastectomy Bilateral 10/22/2019   pt had necrosis after 1st surgery, had another surgery to remove implants 12/10/2019   MOUTH SURGERY     TUBAL LIGATION     TUBAL LIGATION Bilateral 1998   WISDOM TOOTH EXTRACTION     early 16's    Social History   Socioeconomic History   Marital status: Married    Spouse name: Not on file   Number of children: 2   Years of education: Not on file   Highest education level: GED or equivalent  Occupational History   Occupation: Disability  Tobacco Use   Smoking status: Every Day    Current packs/day: 0.50    Average packs/day: 0.5 packs/day for 40.0 years (20.0 ttl pk-yrs)    Types: Cigarettes   Smokeless tobacco: Never   Tobacco comments:    Smokes half a pack a day for 40 years.   Vaping Use   Vaping status: Never Used  Substance and Sexual Activity   Alcohol use: Never   Drug use: Never   Sexual activity: Yes  Other Topics Concern   Not on file  Social History Narrative   Lives with her husband.    Social Determinants of Health   Financial Resource Strain: Medium Risk (06/06/2023)   Overall Financial Resource Strain (CARDIA)    Difficulty of Paying Living Expenses: Somewhat hard  Food Insecurity: Food Insecurity Present (06/06/2023)   Hunger Vital Sign    Worried About Running Out of Food in the Last Year: Sometimes true    Ran Out of Food in the Last Year: Sometimes true  Transportation Needs: No Transportation Needs (06/06/2023)   PRAPARE - Administrator, Civil Service (Medical): No    Lack of Transportation (Non-Medical): No  Physical Activity: Insufficiently Active (06/06/2023)   Exercise Vital Sign    Days of Exercise per Week: 2 days    Minutes of Exercise per Session: 20 min  Stress: Stress Concern Present  (06/06/2023)   [employer] of Occupational Health - Occupational Stress Questionnaire    Feeling of Stress : Rather much  Social Connections: Unknown (06/06/2023)   Social Connection and Isolation Panel [NHANES]    Frequency of Communication with Friends and Family: Twice a week    Frequency of Social Gatherings with Friends and Family: Once a week    Attends Religious Services: Patient declined    Database administrator or Organizations: No    Attends Banker Meetings: Not on file    Marital Status: Married  Intimate Partner Violence: Not At Risk (03/04/2022)   Received from [facility]   Humiliation, Afraid, Rape, and Kick questionnaire    Fear of Current or Ex-Partner: No    Emotionally Abused: No  Physically Abused: No    Sexually Abused: No    Family History  Problem Relation Age of Onset   Leukemia Mother    Breast cancer Sister    Diabetes Maternal Uncle    Hyperlipidemia Maternal Uncle    Diabetes Mellitus II Maternal Grandmother    Heart disease Maternal Grandfather    Diabetes Maternal Grandfather    Breast cancer Cousin    Breast cancer Half-Sister    Osteoporosis Half-Sister    Lupus Half-Sister    Lupus Half-Sister    Osteoporosis Half-Sister    Cancer - Lung Neg Hx    Colon cancer Neg Hx      Current Outpatient Medications:    amLODipine (NORVASC) 5 MG tablet, Take 1 tablet (5 mg total) by mouth daily., Disp: 90 tablet, Rfl: 2   Ascorbic Acid (VITAMIN C) 1000 MG tablet, Take 1,000 mg by mouth daily., Disp: , Rfl:    aspirin 81 MG EC tablet, Take 81 mg by mouth daily., Disp: , Rfl:    butalbital-acetaminophen-caffeine (FIORICET) 50-325-40 MG tablet, Take 1 tablet by mouth every 4 (four) hours as needed for headache., Disp: 30 tablet, Rfl: 0   Calcium-Magnesium-Vitamin D (CALCIUM 1200+D3 PO), Take 1 tablet by mouth daily., Disp: , Rfl:    cetirizine-pseudoephedrine (ZYRTEC-D) 5-120 MG  tablet, Take 1 tablet by mouth daily., Disp: , Rfl:    CINNAMON PO, Take 1,000 mg by mouth daily., Disp: , Rfl:    clindamycin (CLINDAGEL) 1 % gel, Apply topically 2 (two) times daily., Disp: 30 g, Rfl: 0   cyanocobalamin 1000 MCG tablet, Take 1 tablet (1,000 mcg total) by mouth daily. (Patient taking differently: Take 1,000 mcg by mouth every other day.), Disp: 30 tablet, Rfl: 6   cyclobenzaprine (FLEXERIL) 10 MG tablet, Take 1 tablet (10 mg total) by mouth 2 (two) times daily as needed for muscle spasms., Disp: 60 tablet, Rfl: 5   denosumab (XGEVA) 120 MG/1.7ML SOLN injection, Inject 120 mg into the skin every 30 (thirty) days., Disp: , Rfl:    dexamethasone (DECADRON) 0.5 MG/5ML solution, Take 0.5 mg by mouth daily., Disp: , Rfl:    dexamethasone 0.5 mg/5 mL - diphenhydrAMINE 12.5 mg/5 mL oral solution 2:1 mixture, Take 10 mLs by mouth 2 (two) times daily., Disp: 480 mL, Rfl: 2   exemestane (AROMASIN) 25 MG tablet, Take 1 tablet (25 mg total) by mouth daily after breakfast., Disp: 90 tablet, Rfl: 1   furosemide (LASIX) 20 MG tablet, Take 1 tablet (20 mg total) by mouth daily as needed for edema (Take daily as needed for edema)., Disp: 30 tablet, Rfl: 0   HYDROcodone-acetaminophen (NORCO) 5-325 MG tablet, Take 1 tablet by mouth every 12 (twelve) hours as needed for moderate pain., Disp: 30 tablet, Rfl: 0   lansoprazole (PREVACID) 30 MG capsule, TAKE 1 CAPSULE BY MOUTH IN THE MORNING AND AT BEDTIME, Disp: 120 capsule, Rfl: 3   methylPREDNISolone (MEDROL DOSEPAK) 4 MG TBPK tablet, Take 6 tabs day 1, 5 tabs day 2 and reduce by 1 tablet each day until completed, Disp: 21 tablet, Rfl: 0   Misc. Devices MISC, Please provide patient with left arm compression sleeve. Diagnosis metastatic breast cancer, bilateral mastectomy, left arm lymphedema, Disp: 1 each, Rfl: 0   NAPHCON-A 0.025-0.3 % ophthalmic solution, Place 2 drops into both eyes in the morning and at bedtime., Disp: , Rfl:    predniSONE (DELTASONE)  20 MG tablet, Take 2 tablets (40 mg total) by mouth daily with breakfast.,  Disp: 6 tablet, Rfl: 0   ribociclib succ (KISQALI, 600 MG DOSE,) 200 MG Therapy Pack, Take 3 tablets (600 mg total) by mouth daily. Take for 21 days on, 7 days off, repeat every 28 days., Disp: 63 tablet, Rfl: 3  Physical exam:  Vitals:   10/17/23 1048  BP: (!) 149/74  Pulse: 78  Resp: 16  Temp: 98.2 F (36.8 C)  TempSrc: Oral  SpO2: 100%  Weight: 187 lb 3.2 oz (84.9 kg)   Physical Exam Constitutional:      Appearance: Normal appearance.  Musculoskeletal:     Right lower leg: Pitting Edema present.     Left lower leg: Pitting Edema present.  Skin:    Findings: Erythema and rash present. Rash is macular and papular.     Comments: Scattered maculopapular rash to bilateral lower extremities with erythema and edema.  Neurological:     Mental Status: She is alert.     Photos under media tab.    Latest Ref Rng & Units 10/08/2023   12:19 PM  CMP  Glucose 70 - 99 mg/dL 88   BUN 8 - 23 mg/dL 18   Creatinine 3.87 - 1.00 mg/dL 5.64   Sodium 332 - 951 mmol/L 138   Potassium 3.5 - 5.1 mmol/L 4.3   Chloride 98 - 111 mmol/L 104   CO2 22 - 32 mmol/L 23   Calcium 8.9 - 10.3 mg/dL 9.7   Total Protein 6.5 - 8.1 g/dL 7.5   Total Bilirubin 0.3 - 1.2 mg/dL 0.8   Alkaline Phos 38 - 126 U/L 65   AST 15 - 41 U/L 29   ALT 0 - 44 U/L 32       Latest Ref Rng & Units 10/08/2023   12:19 PM  CBC  WBC 4.0 - 10.5 K/uL 4.7   Hemoglobin 12.0 - 15.0 g/dL 88.4   Hematocrit 16.6 - 46.0 % 41.9   Platelets 150 - 400 K/uL 390     No images are attached to the encounter.  No results found.   Assessment and plan- Patient is a 62 y.o. 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  She has 1 more week on Ribociclib.  Required Medrol Dosepak with last cycle secondary to itching  and flushed feeling.  1. Rash and nonspecific skin eruption Likely secondary to chemo.  Reviewed chart and she has 26 allergies many of them being antibiotics.  Discussed trial of topical antibiotic cream with clindamycin and 3 days worth of oral prednisone.  She was agreeable.  We discussed in length, return to clinic or call on-call line if symptoms worsen over the weekend.  Recommend she continue Zyrtec each morning and Benadryl at bedtime.  - clindamycin (CLINDAGEL) 1 % gel; Apply topically 2 (two) times daily.  Dispense: 30 g; Refill: 0 - predniSONE (DELTASONE) 20 MG tablet; Take 2 tablets (40 mg total) by mouth daily with breakfast.  Dispense: 6 tablet; Refill: 0  Visit Diagnosis 1. Rash and nonspecific skin eruption     Patient expressed understanding and was in agreement with this plan. She also understands that She can call clinic at any time with any questions, concerns, or complaints.   Greater than 50% was spent in counseling and coordination of care with this patient including but not limited to discussion of the relevant topics above (See A&P) including, but  not limited to diagnosis and management of acute and chronic medical conditions.   Thank you for allowing me to participate in the care of this very pleasant patient.    Mauro Kaufmann, NP [facility]  10/17/2023 3:18 PM

## 2023-10-17 NOTE — Patient Instructions (Signed)
Apply clindamycin gel 2-3 times daily. Take prednisone 40 mg each morning with breakfast X 3 days. Continue benadryl at bedtime and zyrtec each morning.

## 2023-10-20 ENCOUNTER — Encounter: Payer: Self-pay | Admitting: Oncology

## 2023-10-20 NOTE — Progress Notes (Unsigned)
Re: Follow-up  See previous symptom management note  Patient called clinic today to report bilateral lower extremity erythema, warmth and maculopapular rash bilaterally was not improving with prescribed clindamycin antibiotic cream and 40 mg prednisone.  She took her last dose of prednisone this morning.  Reports new onset of erythema to hands.  No blisters.  Rash is relatively nonpainful although it is itchy and hot to the touch.  Symptoms are essentially stable.  Discussed with Dr. Ellin Saba stopping Idelle Jo.  Had similar symptoms with cycle 1 of Kisqali with no rash but erythema and itching and was given Medrol Dosepak with improvement of symptoms.  She was 2 days away from completing cycle 2 of 3 weeks on 1 week off Kisqali.   Discussed with patient returning to clinic tomorrow for follow-up and reassessment with Rojelio Brenner, PA.  Durenda Hurt, NP 10/20/2023 6:45 PM

## 2023-10-21 ENCOUNTER — Other Ambulatory Visit: Payer: Self-pay | Admitting: Nurse Practitioner

## 2023-10-21 ENCOUNTER — Encounter: Payer: Self-pay | Admitting: Hematology

## 2023-10-21 ENCOUNTER — Inpatient Hospital Stay (HOSPITAL_BASED_OUTPATIENT_CLINIC_OR_DEPARTMENT_OTHER): Payer: Medicaid Other | Admitting: Physician Assistant

## 2023-10-21 VITALS — BP 148/66 | HR 83 | Temp 98.9°F | Resp 18 | Ht 64.0 in | Wt 188.3 lb

## 2023-10-21 DIAGNOSIS — Z7952 Long term (current) use of systemic steroids: Secondary | ICD-10-CM | POA: Diagnosis not present

## 2023-10-21 DIAGNOSIS — Z803 Family history of malignant neoplasm of breast: Secondary | ICD-10-CM | POA: Diagnosis not present

## 2023-10-21 DIAGNOSIS — Z79899 Other long term (current) drug therapy: Secondary | ICD-10-CM | POA: Diagnosis not present

## 2023-10-21 DIAGNOSIS — M8580 Other specified disorders of bone density and structure, unspecified site: Secondary | ICD-10-CM | POA: Diagnosis not present

## 2023-10-21 DIAGNOSIS — R21 Rash and other nonspecific skin eruption: Secondary | ICD-10-CM | POA: Diagnosis not present

## 2023-10-21 DIAGNOSIS — M7989 Other specified soft tissue disorders: Secondary | ICD-10-CM | POA: Diagnosis not present

## 2023-10-21 DIAGNOSIS — L299 Pruritus, unspecified: Secondary | ICD-10-CM | POA: Diagnosis not present

## 2023-10-21 DIAGNOSIS — Z170 Estrogen receptor positive status [ER+]: Secondary | ICD-10-CM | POA: Diagnosis not present

## 2023-10-21 DIAGNOSIS — Z7982 Long term (current) use of aspirin: Secondary | ICD-10-CM | POA: Diagnosis not present

## 2023-10-21 DIAGNOSIS — Z9012 Acquired absence of left breast and nipple: Secondary | ICD-10-CM | POA: Diagnosis not present

## 2023-10-21 DIAGNOSIS — C7951 Secondary malignant neoplasm of bone: Secondary | ICD-10-CM | POA: Diagnosis not present

## 2023-10-21 DIAGNOSIS — Z79811 Long term (current) use of aromatase inhibitors: Secondary | ICD-10-CM | POA: Diagnosis not present

## 2023-10-21 DIAGNOSIS — G629 Polyneuropathy, unspecified: Secondary | ICD-10-CM | POA: Diagnosis not present

## 2023-10-21 DIAGNOSIS — F17210 Nicotine dependence, cigarettes, uncomplicated: Secondary | ICD-10-CM | POA: Diagnosis not present

## 2023-10-21 DIAGNOSIS — C50912 Malignant neoplasm of unspecified site of left female breast: Secondary | ICD-10-CM | POA: Diagnosis not present

## 2023-10-21 MED ORDER — BETAMETHASONE DIPROPIONATE 0.05 % EX CREA: TOPICAL_CREAM | Freq: Two times a day (BID) | CUTANEOUS | 1 refills | Status: DC

## 2023-10-21 MED ORDER — METHYLPREDNISOLONE 4 MG PO TBPK: ORAL_TABLET | ORAL | 0 refills | Status: DC

## 2023-10-21 NOTE — Patient Instructions (Signed)
[facility] **VISIT SUMMARY & IMPORTANT INSTRUCTIONS **   You were seen today by Rojelio Brenner PA-C for your symptom management visit.    RASH Your rash is most likely a side effect of your Kisqali chemotherapy pill.  You should NOT TAKE your Kisqali for the next 2 weeks, until you have seen Dr. Ellin Saba on 11/05/2023. You did not have any signs of bacterial infection. Continue to take Zyrtec each morning and Benadryl at bedtime. Alternatively, you could take Allegra (every 12 hours) or "Allegra Hives" (every 24 hours). Allegra and Allegra Hives have the same active ingredient (fexofenadine), but Allegra Hives has a higher dose of fexofenadine. If you decide to take Allegra, you should NOT take Zyrtec and Benadryl in addition to the Allegra. I have sent prescription for Medrol Dosepak (high-dose oral steroids). I have also sent a prescription for betamethasone steroid cream.  Apply this twice daily to the affected areas. If you notice any worsening redness, skin sores, or blisters, please let our office know immediately! Otherwise, you are scheduled for follow-up with Dr. Ellin Saba in 2 weeks.  ** Thank you for trusting me with your healthcare!  I strive to provide all of my patients with quality care at each visit.  If you receive a survey for this visit, I would be so grateful to you for taking the time to provide feedback.  Thank you in advance!  ~ Christropher Gintz                   Dr. [name]   &   Rojelio Brenner, PA-C   - - - - - - - - - - - - - - - - - -    Thank you for choosing [facility] at [facility] to provide your oncology and hematology care.  To afford each patient quality time with our provider, please arrive at least 15 minutes before your scheduled appointment time.   If you have a lab appointment with the Cancer Center please come in thru the Main Entrance and check in at the main information  desk.  You need to re-schedule your appointment should you arrive 10 or more minutes late.  We strive to give you quality time with our providers, and arriving late affects you and other patients whose appointments are after yours.  Also, if you no show three or more times for appointments you may be dismissed from the clinic at the providers discretion.     Again, thank you for choosing [facility].  Our hope is that these requests will decrease the amount of time that you wait before being seen by our physicians.       _____________________________________________________________  Should you have questions after your visit to [facility], please contact our office at [phone] and follow the prompts.  Our office hours are 8:00 a.m. and 4:30 p.m. Monday - Friday.  Please note that voicemails left after 4:00 p.m. may not be returned until the following business day.  We are closed weekends and major holidays.  You do have access to a nurse 24-7, just call the main number to the clinic [phone] and do not press any options, hold on the line and a nurse will answer the phone.    For prescription refill requests, have your pharmacy contact our office and allow 72 hours.

## 2023-10-21 NOTE — Progress Notes (Addendum)
[facility] [address] [place] [address] Phone: [phone] Fax: [phone]  SYMPTOM MANAGEMENT CLINIC PROGRESS NOTE   Wendy Zamora [id number] 1961/08/28 62 y.o.  INTERVAL HISTORY:  Chief Complaint: Rash of bilateral legs  Wendy Zamora is managed by Dr. Ellin Saba for breast cancer with metastatic disease to the bone. She was started on Ibrance and Faslodex in December 2021 for metastatic disease and ultimately stopped on 07/23/2023 due to progression. She was started on ribociclib and exemestane on 08/07/2023.  Symptom management visit with NP Durenda Hurt on 10/17/2023 due to rash on bilateral legs.  Per recommendation of NP Burns, she has been taking Zyrtec each morning, Benadryl at bedtime, and topical clindamycin twice daily.  She took prednisone 40 mg daily x 3 days.  She reports minimal improvement in symptoms.  She continues to have burning pain and redness of bilateral lower extremities and also noticed some redness and burning sensation of bilateral hands starting yesterday.  She also reports some mild erythema and pruritus beginning on her neck and upper chest.  She denies any skin sores, fevers, or chills.  She has been holding Kisqali since 10/20/2023, per direction of Dr. Genelle Bal.   ASSESSMENT & PLAN:  ## RASH - Suspect cutaneous adverse reaction to Kisqali, grade 2 (approximately 25% body surface area affected) - No clinical concern for bacterial infection at this time. - Per discussion with Dr. Ellin Saba, continue to [facility] (last dose 10/20/2023) - PLAN: Continue to HOLD Kisqali (last dose on 10/20/2023). - Prescription for Medrol Dosepak sent to pharmacy due to rash now spreading to neck/chest and hands. - Prescription for betamethasone steroid cream to be applied twice daily. - Clindamycin cream discontinued. - Patient can continue oral antihistamines. - Patient instructed to call clinic if any new  symptoms or worsening skin reaction.  Otherwise, follow-up with Dr. Ellin Saba as scheduled in 2 weeks.   REVIEW OF SYSTEMS:   Review of Systems  Constitutional:  Negative for activity change, appetite change, chills, diaphoresis, fatigue, fever and unexpected weight change.  HENT:  Positive for trouble swallowing. Negative for mouth sores, nosebleeds and sore throat.   Respiratory:  Positive for shortness of breath (with exertion). Negative for cough.   Cardiovascular:  Negative for chest pain, palpitations and leg swelling.  Gastrointestinal:  Positive for constipation. Negative for abdominal pain, blood in stool, diarrhea, nausea and vomiting.  Genitourinary:  Negative for dysuria and hematuria.  Skin:  Positive for rash.  Neurological:  Positive for numbness and headaches. Negative for dizziness and light-headedness.  Psychiatric/Behavioral:  Positive for sleep disturbance. Negative for dysphoric mood. The patient is not nervous/anxious.     Past Medical History, Surgical history, Social history, and Family history were reviewed as documented elsewhere in chart, and were updated as appropriate.   OBJECTIVE:  Physical Exam:  LMP 06/08/2012  ECOG: 1  Physical Exam Constitutional:      Appearance: Normal appearance. She is obese.  Cardiovascular:     Heart sounds: Normal heart sounds.  Pulmonary:     Breath sounds: Normal breath sounds.  Skin:    Findings: Rash (Maculopapular rash of bilateral lower extremities (below the knee).  Faint maculopapular rash of upper chest and anterior neck.  Erythema and warmth of bilateral palmar surfaces.  No skin blistering or open sores noted.) present.     Comments: RASH PICTURED BELOW  Neurological:     General: No focal deficit present.     Mental  Status: Mental status is at baseline.  Psychiatric:        Behavior: Behavior normal. Behavior is cooperative.           Lab Review:     Component Value Date/Time   NA 138 10/08/2023  1219   K 4.3 10/08/2023 1219   CL 104 10/08/2023 1219   CO2 23 10/08/2023 1219   GLUCOSE 88 10/08/2023 1219   BUN 18 10/08/2023 1219   CREATININE 1.07 (H) 10/08/2023 1219   CALCIUM 9.7 10/08/2023 1219   PROT 7.5 10/08/2023 1219   ALBUMIN 4.2 10/08/2023 1219   AST 29 10/08/2023 1219   ALT 32 10/08/2023 1219   ALKPHOS 65 10/08/2023 1219   BILITOT 0.8 10/08/2023 1219   GFRNONAA 59 (L) 10/08/2023 1219   GFRAA >90 06/08/2012 1251       Component Value Date/Time   WBC 4.7 10/08/2023 1219   RBC 3.98 10/08/2023 1219   HGB 14.2 10/08/2023 1219   HCT 41.9 10/08/2023 1219   PLT 390 10/08/2023 1219   MCV 105.3 (H) 10/08/2023 1219   MCH 35.7 (H) 10/08/2023 1219   MCHC 33.9 10/08/2023 1219   RDW 13.7 10/08/2023 1219   LYMPHSABS 1.1 10/08/2023 1219   MONOABS 0.3 10/08/2023 1219   EOSABS 0.1 10/08/2023 1219   BASOSABS 0.1 10/08/2023 1219   -------------------------------  Imaging from last 24 hours (if applicable): Radiology interpretation: No results found.    WRAP UP:  All questions were answered. The patient knows to call the clinic with any problems, questions or concerns.  Medical decision making: Moderate  Time spent on visit: I spent 20 minutes counseling the patient face to face. The total time spent in the appointment was 30 minutes and more than 50% was on counseling.  Carnella Guadalajara, PA-C  10/21/23 12:46 PM

## 2023-10-22 ENCOUNTER — Other Ambulatory Visit: Payer: Self-pay

## 2023-10-22 ENCOUNTER — Telehealth: Payer: Self-pay

## 2023-10-22 DIAGNOSIS — R21 Rash and other nonspecific skin eruption: Secondary | ICD-10-CM

## 2023-10-22 MED ORDER — BETAMETHASONE DIPROPIONATE 0.05 % EX CREA: TOPICAL_CREAM | Freq: Two times a day (BID) | CUTANEOUS | 1 refills | Status: DC

## 2023-10-22 NOTE — Telephone Encounter (Signed)
Notified Patient of prior authorization approval for Betamethasone Dip 0.05% Cream. Medication is approved through 10/21/2024. No other needs or concerns voiced at this time.

## 2023-10-23 ENCOUNTER — Other Ambulatory Visit (HOSPITAL_COMMUNITY): Payer: 59

## 2023-10-23 ENCOUNTER — Inpatient Hospital Stay: Payer: Medicaid Other

## 2023-10-28 ENCOUNTER — Inpatient Hospital Stay: Payer: Medicaid Other

## 2023-10-28 ENCOUNTER — Inpatient Hospital Stay: Payer: Medicaid Other | Admitting: Hematology

## 2023-10-29 ENCOUNTER — Inpatient Hospital Stay: Payer: Medicaid Other

## 2023-10-29 ENCOUNTER — Encounter: Payer: Self-pay | Admitting: Hematology

## 2023-10-29 DIAGNOSIS — Z7952 Long term (current) use of systemic steroids: Secondary | ICD-10-CM | POA: Diagnosis not present

## 2023-10-29 DIAGNOSIS — M8580 Other specified disorders of bone density and structure, unspecified site: Secondary | ICD-10-CM | POA: Diagnosis not present

## 2023-10-29 DIAGNOSIS — C50919 Malignant neoplasm of unspecified site of unspecified female breast: Secondary | ICD-10-CM

## 2023-10-29 DIAGNOSIS — C50912 Malignant neoplasm of unspecified site of left female breast: Secondary | ICD-10-CM | POA: Diagnosis not present

## 2023-10-29 DIAGNOSIS — M7989 Other specified soft tissue disorders: Secondary | ICD-10-CM | POA: Diagnosis not present

## 2023-10-29 DIAGNOSIS — F17210 Nicotine dependence, cigarettes, uncomplicated: Secondary | ICD-10-CM | POA: Diagnosis not present

## 2023-10-29 DIAGNOSIS — G629 Polyneuropathy, unspecified: Secondary | ICD-10-CM | POA: Diagnosis not present

## 2023-10-29 DIAGNOSIS — Z803 Family history of malignant neoplasm of breast: Secondary | ICD-10-CM | POA: Diagnosis not present

## 2023-10-29 DIAGNOSIS — Z79811 Long term (current) use of aromatase inhibitors: Secondary | ICD-10-CM | POA: Diagnosis not present

## 2023-10-29 DIAGNOSIS — Z7982 Long term (current) use of aspirin: Secondary | ICD-10-CM | POA: Diagnosis not present

## 2023-10-29 DIAGNOSIS — Z79899 Other long term (current) drug therapy: Secondary | ICD-10-CM | POA: Diagnosis not present

## 2023-10-29 DIAGNOSIS — Z9012 Acquired absence of left breast and nipple: Secondary | ICD-10-CM | POA: Diagnosis not present

## 2023-10-29 DIAGNOSIS — Z170 Estrogen receptor positive status [ER+]: Secondary | ICD-10-CM | POA: Diagnosis not present

## 2023-10-29 DIAGNOSIS — R21 Rash and other nonspecific skin eruption: Secondary | ICD-10-CM | POA: Diagnosis not present

## 2023-10-29 DIAGNOSIS — C7951 Secondary malignant neoplasm of bone: Secondary | ICD-10-CM | POA: Diagnosis not present

## 2023-10-29 LAB — CBC WITH DIFFERENTIAL/PLATELET
Abs Immature Granulocytes: 0.07 10*3/uL (ref 0.00–0.07)
Basophils Absolute: 0.1 10*3/uL (ref 0.0–0.1)
Basophils Relative: 1 %
Eosinophils Absolute: 0.1 10*3/uL (ref 0.0–0.5)
Eosinophils Relative: 1 %
HCT: 43.5 % (ref 36.0–46.0)
Hemoglobin: 15 g/dL (ref 12.0–15.0)
Immature Granulocytes: 1 %
Lymphocytes Relative: 24 %
Lymphs Abs: 1.2 10*3/uL (ref 0.7–4.0)
MCH: 36.7 pg — ABNORMAL HIGH (ref 26.0–34.0)
MCHC: 34.5 g/dL (ref 30.0–36.0)
MCV: 106.4 fL — ABNORMAL HIGH (ref 80.0–100.0)
Monocytes Absolute: 0.6 10*3/uL (ref 0.1–1.0)
Monocytes Relative: 11 %
Neutro Abs: 3.1 10*3/uL (ref 1.7–7.7)
Neutrophils Relative %: 62 %
Platelets: 311 10*3/uL (ref 150–400)
RBC: 4.09 MIL/uL (ref 3.87–5.11)
RDW: 14.6 % (ref 11.5–15.5)
WBC: 5 10*3/uL (ref 4.0–10.5)
nRBC: 0 % (ref 0.0–0.2)

## 2023-10-29 LAB — COMPREHENSIVE METABOLIC PANEL
ALT: 37 U/L (ref 0–44)
AST: 27 U/L (ref 15–41)
Albumin: 3.9 g/dL (ref 3.5–5.0)
Alkaline Phosphatase: 60 U/L (ref 38–126)
Anion gap: 12 (ref 5–15)
BUN: 18 mg/dL (ref 8–23)
CO2: 23 mmol/L (ref 22–32)
Calcium: 9.6 mg/dL (ref 8.9–10.3)
Chloride: 105 mmol/L (ref 98–111)
Creatinine, Ser: 0.92 mg/dL (ref 0.44–1.00)
GFR, Estimated: >60 mL/min (ref >60)
Glucose, Bld: 112 mg/dL — ABNORMAL HIGH (ref 70–99)
Potassium: 3.3 mmol/L — ABNORMAL LOW (ref 3.5–5.1)
Sodium: 140 mmol/L (ref 135–145)
Total Bilirubin: 0.7 mg/dL (ref 0.3–1.2)
Total Protein: 7.2 g/dL (ref 6.5–8.1)

## 2023-10-29 LAB — MAGNESIUM: Magnesium: 2.2 mg/dL (ref 1.7–2.4)

## 2023-10-30 ENCOUNTER — Ambulatory Visit (HOSPITAL_COMMUNITY)
Admission: RE | Admit: 2023-10-30 | Discharge: 2023-10-30 | Disposition: A | Discharge status: Home / Self Care | Payer: Medicaid Other | Source: Ambulatory Visit | Attending: Hematology | Admitting: Hematology

## 2023-10-30 DIAGNOSIS — C50919 Malignant neoplasm of unspecified site of unspecified female breast: Secondary | ICD-10-CM | POA: Diagnosis not present

## 2023-10-30 LAB — CANCER ANTIGEN 27.29: CA 27.29: 48.3 U/mL — ABNORMAL HIGH (ref 0.0–38.6)

## 2023-10-30 LAB — CANCER ANTIGEN 15-3: CA 15-3: 38.2 U/mL — ABNORMAL HIGH (ref 0.0–25.0)

## 2023-10-30 MED ORDER — FLUDEOXYGLUCOSE F - 18 (FDG) INJECTION
10.1200 | Freq: Once | INTRAVENOUS | Status: AC | PRN
  Administered 2023-10-30: 10.12 via INTRAVENOUS

## 2023-11-05 ENCOUNTER — Inpatient Hospital Stay: Payer: Medicaid Other | Attending: Hematology | Admitting: Hematology

## 2023-11-05 ENCOUNTER — Inpatient Hospital Stay: Payer: Medicaid Other

## 2023-11-05 ENCOUNTER — Other Ambulatory Visit (HOSPITAL_COMMUNITY): Payer: Self-pay

## 2023-11-05 ENCOUNTER — Other Ambulatory Visit: Payer: Self-pay | Admitting: *Deleted

## 2023-11-05 VITALS — BP 127/79 | HR 87 | Temp 98.4°F | Resp 18 | Ht 64.0 in | Wt 186.0 lb

## 2023-11-05 DIAGNOSIS — Z7982 Long term (current) use of aspirin: Secondary | ICD-10-CM | POA: Insufficient documentation

## 2023-11-05 DIAGNOSIS — Z7952 Long term (current) use of systemic steroids: Secondary | ICD-10-CM | POA: Diagnosis not present

## 2023-11-05 DIAGNOSIS — Z79811 Long term (current) use of aromatase inhibitors: Secondary | ICD-10-CM | POA: Insufficient documentation

## 2023-11-05 DIAGNOSIS — Z170 Estrogen receptor positive status [ER+]: Secondary | ICD-10-CM | POA: Insufficient documentation

## 2023-11-05 DIAGNOSIS — Z803 Family history of malignant neoplasm of breast: Secondary | ICD-10-CM | POA: Insufficient documentation

## 2023-11-05 DIAGNOSIS — M7989 Other specified soft tissue disorders: Secondary | ICD-10-CM | POA: Insufficient documentation

## 2023-11-05 DIAGNOSIS — C50919 Malignant neoplasm of unspecified site of unspecified female breast: Secondary | ICD-10-CM

## 2023-11-05 DIAGNOSIS — Z79899 Other long term (current) drug therapy: Secondary | ICD-10-CM | POA: Diagnosis not present

## 2023-11-05 DIAGNOSIS — C50912 Malignant neoplasm of unspecified site of left female breast: Secondary | ICD-10-CM | POA: Insufficient documentation

## 2023-11-05 DIAGNOSIS — C7951 Secondary malignant neoplasm of bone: Secondary | ICD-10-CM | POA: Insufficient documentation

## 2023-11-05 DIAGNOSIS — Z9013 Acquired absence of bilateral breasts and nipples: Secondary | ICD-10-CM | POA: Diagnosis not present

## 2023-11-05 DIAGNOSIS — F17210 Nicotine dependence, cigarettes, uncomplicated: Secondary | ICD-10-CM | POA: Diagnosis not present

## 2023-11-05 DIAGNOSIS — G629 Polyneuropathy, unspecified: Secondary | ICD-10-CM | POA: Diagnosis not present

## 2023-11-05 MED ORDER — PREGABALIN 25 MG PO CAPS: 25.0000 mg | ORAL_CAPSULE | Freq: Two times a day (BID) | ORAL | 2 refills | Status: DC

## 2023-11-05 MED ORDER — DENOSUMAB 120 MG/1.7ML ~~LOC~~ SOLN
120.0000 mg | Freq: Once | SUBCUTANEOUS | Status: AC
  Administered 2023-11-05: 120 mg via SUBCUTANEOUS
  Filled 2023-11-05: qty 1.7

## 2023-11-05 NOTE — Patient Instructions (Addendum)
[facility] at [facility] Discharge Instructions   You were seen and examined today by Dr. Ellin Saba.  He reviewed the results of your PET scan which is stable. There is no evidence of the cancer growing or spreading.   He reviewed the results of your lab work which are normal/stable.   We will refer you to Dr. Margo Aye (dermatologist) evaluate you for the rash on your legs. Do not restart Kisqali until you see [place] again.   We will see you back in 5 weeks. We will repeat lab work prior to this visit.   Return as scheduled.       Thank you for choosing [facility] at [facility] to provide your oncology and hematology care.  To afford each patient quality time with our provider, please arrive at least 15 minutes before your scheduled appointment time.   If you have a lab appointment with the Cancer Center please come in thru the Main Entrance and check in at the main information desk.  You need to re-schedule your appointment should you arrive 10 or more minutes late.  We strive to give you quality time with our providers, and arriving late affects you and other patients whose appointments are after yours.  Also, if you no show three or more times for appointments you may be dismissed from the clinic at the providers discretion.     Again, thank you for choosing [facility].  Our hope is that these requests will decrease the amount of time that you wait before being seen by our physicians.       _____________________________________________________________  Should you have questions after your visit to [facility], please contact our office at [phone] and follow the prompts.  Our office hours are 8:00 a.m. and 4:30 p.m. Monday - Friday.  Please note that voicemails left after 4:00 p.m. may not be returned until the following business day.  We are closed weekends and major holidays.  You do have access to a nurse  24-7, just call the main number to the clinic [phone] and do not press any options, hold on the line and a nurse will answer the phone.    For prescription refill requests, have your pharmacy contact our office and allow 72 hours.    Due to Covid, you will need to wear a mask upon entering the hospital. If you do not have a mask, a mask will be given to you at the Main Entrance upon arrival. For doctor visits, patients may have 1 support person age 26 or older with them. For treatment visits, patients can not have anyone with them due to social distancing guidelines and our immunocompromised population.

## 2023-11-05 NOTE — Patient Instructions (Signed)
Denosumab Injection (Oncology) What is this medication? DENOSUMAB (den oh SUE mab) prevents weakened bones caused by cancer. It may also be used to treat noncancerous bone tumors that cannot be removed by surgery. It can also be used to treat high calcium levels in the blood caused by cancer. It works by blocking a protein that causes bones to break down quickly. This slows down the release of calcium from bones, which lowers calcium levels in your blood. It also makes your bones stronger and less likely to break (fracture). This medicine may be used for other purposes; ask your health care provider or pharmacist if you have questions. COMMON BRAND NAME(S): XGEVA What should I tell my care team before I take this medication? They need to know if you have any of these conditions: Dental disease Having surgery or tooth extraction Infection Kidney disease Low levels of calcium or vitamin D in the blood Malnutrition On hemodialysis Skin conditions or sensitivity Thyroid or parathyroid disease An unusual reaction to denosumab, other medications, foods, dyes, or preservatives Pregnant or trying to get pregnant Breast-feeding How should I use this medication? This medication is for injection under the skin. It is given by your care team in a hospital or clinic setting. A special MedGuide will be given to you before each treatment. Be sure to read this information carefully each time. Talk to your care team about the use of this medication in children. While it may be prescribed for children as young as 13 years for selected conditions, precautions do apply. Overdosage: If you think you have taken too much of this medicine contact a poison control center or emergency room at once. NOTE: This medicine is only for you. Do not share this medicine with others. What if I miss a dose? Keep appointments for follow-up doses. It is important not to miss your dose. Call your care team if you are unable to  keep an appointment. What may interact with this medication? Do not take this medication with any of the following: Other medications containing denosumab This medication may also interact with the following: Medications that lower your chance of fighting infection Steroid medications, such as prednisone or cortisone This list may not describe all possible interactions. Give your health care provider a list of all the medicines, herbs, non-prescription drugs, or dietary supplements you use. Also tell them if you smoke, drink alcohol, or use illegal drugs. Some items may interact with your medicine. What should I watch for while using this medication? Your condition will be monitored carefully while you are receiving this medication. You may need blood work while taking this medication. This medication may increase your risk of getting an infection. Call your care team for advice if you get a fever, chills, sore throat, or other symptoms of a cold or flu. Do not treat yourself. Try to avoid being around people who are sick. You should make sure you get enough calcium and vitamin D while you are taking this medication, unless your care team tells you not to. Discuss the foods you eat and the vitamins you take with your care team. Some people who take this medication have severe bone, joint, or muscle pain. This medication may also increase your risk for jaw problems or a broken thigh bone. Tell your care team right away if you have severe pain in your jaw, bones, joints, or muscles. Tell your care team if you have any pain that does not go away or that gets worse. Talk   to your care team if you may be pregnant. Serious birth defects can occur if you take this medication during pregnancy and for 5 months after the last dose. You will need a negative pregnancy test before starting this medication. Contraception is recommended while taking this medication and for 5 months after the last dose. Your care team  can help you find the option that works for you. What side effects may I notice from receiving this medication? Side effects that you should report to your care team as soon as possible: Allergic reactions--skin rash, itching, hives, swelling of the face, lips, tongue, or throat Bone, joint, or muscle pain Low calcium level--muscle pain or cramps, confusion, tingling, or numbness in the hands or feet Osteonecrosis of the jaw--pain, swelling, or redness in the mouth, numbness of the jaw, poor healing after dental work, unusual discharge from the mouth, visible bones in the mouth Side effects that usually do not require medical attention (report to your care team if they continue or are bothersome): Cough Diarrhea Fatigue Headache Nausea This list may not describe all possible side effects. Call your doctor for medical advice about side effects. You may report side effects to FDA at 1-800-FDA-1088. Where should I keep my medication? This medication is given in a hospital or clinic. It will not be stored at home. NOTE: This sheet is a summary. It may not cover all possible information. If you have questions about this medicine, talk to your doctor, pharmacist, or health care provider.  2024 [employer]/Gold Standard (2022-05-08 00:00:00)  

## 2023-11-05 NOTE — Progress Notes (Signed)
Patient is taking Kisqali as prescribed. She has been holding the medication due to a rash on the lower extremities.

## 2023-11-05 NOTE — Progress Notes (Signed)
[facility] [address], [place] [address]    Clinic Day:  11/05/2023  Referring physician: Billie Lade, MD  Patient Care Team: Billie Lade, MD as PCP - General (Internal Medicine) [name], RN as Oncology Nurse Navigator (Oncology) [name], MD as Medical Oncologist (Medical Oncology)   ASSESSMENT & PLAN:   Assessment: 1.  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 - Started on Ibrance and Faslodex in December 2021 for metastatic disease. - PET scan on 08/10/2021 with no evidence of hypermetabolic metastatic disease.  Widespread scattered sclerotic bone metastasis in the spine and pelvis are stable and not metabolically active.  No lymphadenopathy.  Asymmetric muscular uptake in the left lower scalene muscles and in the intercostal muscles of the ventral upper left chest wall without discrete mass correlate on the CT images, favoring activity related uptake. [name] was dose reduced to 100 mg 3 weeks on/1 week off on 10/11/2021 due to fatigue.  Discontinued on 07/23/2023 due to progression - Ribociclib and exemestane started on 08/07/2023  2.  Social/family history: - She is a retired Engineer, structural.  She recently moved back from [place]. - Younger sister had breast cancer in her 82s.  Mother died of leukemia at age 32.  Paternal cousin also had breast cancer.  3.  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  She underwent immediate implants which were later removed on 12/10/2019 secondary to infection and necrosis. - XRT to the chest wall and regional lymph nodes, 60 Gray, from 02/29/2020 through 04/12/2020 - She was treated with adjuvant anastrozole which was started 12/09/2019 and was found to have metastatic disease in November 2021. - Previous genetic testing was reportedly negative and [place].    Plan: 1.  Metastatic breast cancer to the bones, ER/PR positive, HER2 negative: - She was on ribociclib and exemestane. - She came to our clinic with erythematous maculopapular rash on the legs which started around 10/13/2023 and was seen on 10/17/2023, treated with prednisone 50 mg x 3 days.  She called as on 10/20/2023 with burning rash.  Case Quale was held on 10/20/2023.  She was evaluated the next day in our clinic and was given Medrol Dosepak and steroid cream.  She reported that rash started to improve on 10/23/2023.  She could not start the Medrol Dosepak until 10/24/2023. - She reported that rash is coming back more starting Monday.  Today there is very faint rash on the legs.  No rash on the rest of the body.  She reports burning sensation in the legs. - Recommend continue to hold Kisqali. - Recommend dermatology evaluation. - I have reviewed most recent PET scan from 10/30/2023: Stable exam with persistent increased radiotracer uptake in the sclerotic T9 and T7 vertebral bodies, slightly better compared to the previous scan from July. - RTC 5 weeks for follow-up.  2.  Peripheral neuropathy: - She has constant tingling and numbness in the hands and  feet which is stable.  She reports burning sensation in the legs. - She could not tolerate Cymbalta and gabapentin in the past. - Will start her on Lyrica 25 mg 1-2 times daily.  3.  Bone metastatic disease: - Continue denosumab today and monthly.  Calcium is 9.6  today.  4.  Leg swellings: - Continue Lasix as needed.   No orders of the defined types were placed in this encounter.   [name],acting as a Neurosurgeon for [name], MD.,have documented all relevant documentation on the behalf of [name], MD,as directed by  [name], MD while in the presence of [name], MD.  I, [name] MD, have reviewed the above documentation for accuracy and completeness, and I agree with the above.    [name], MD   11/6/20245:09 PM  CHIEF COMPLAINT:   Diagnosis: metastatic breast cancer    Cancer Staging  No matching staging information was found for the patient.    Prior Therapy: Palbociclib and fulvestrant  Current Therapy: Ribociclib and exemestane every 4 weeks    HISTORY OF PRESENT ILLNESS:   Oncology History  Primary malignant neoplasm of breast with metastasis (HCC)  07/06/2021 Initial Diagnosis   Metastatic breast cancer (HCC)   07/18/2021 - 08/15/2022 Chemotherapy   Patient is on Treatment Plan : BREAST Palbociclib + Fulvestrant q28d     07/18/2021 -  Chemotherapy   Patient is on Treatment Plan : BREAST Palbociclib D1-21 + Fulvestrant q28d        INTERVAL HISTORY:   Freddie is a 62 y.o. female seen for follow-up of metastatic breast cancer. She was last seen by Rojelio Brenner, PA on 10/21/23.   Since her last visit, she underwent restaging PET on 10/30/23 that found: a stable exam with persistent increased radiotracer uptake within the sclerotic T9 and T7 vertebral metastasis and no new or progressive sites of disease identified.  Today, she states that she is doing well overall. Her appetite level is at 60%. Her energy level is at 70%.   PAST MEDICAL HISTORY:   Past Medical History: Past Medical History:  Diagnosis Date   Allergy    Asthma    Asthma    diagnosed age 78   Bone metastasis 08/2020   was on prolia   Breast cancer (HCC)    started chemo  05/05/2019. finished 08/2019   Gangrene (HCC)    GERD (gastroesophageal reflux disease)    Hx of bilateral mastectomy 10/22/2019   Hypertension    Hypertension    started bp meds 2008   Lymphedema of arm    left arm   Osteopenia    Shingles 04/20/2020   left side    Surgical History: Past Surgical History:  Procedure Laterality Date   BIOPSY  01/22/2022   Procedure: BIOPSY;  Surgeon: Marguerita Merles, Reuel Boom, MD;  Location: [facility];  Service: Gastroenterology;;   Fidela Salisbury RELEASE Bilateral 2006   ESOPHAGOGASTRODUODENOSCOPY (EGD) WITH PROPOFOL N/A 01/22/2022   Procedure: ESOPHAGOGASTRODUODENOSCOPY (EGD) WITH PROPOFOL;  Surgeon: Dolores Frame, MD;  Location: [facility];  Service: Gastroenterology;  Laterality: N/A;  205   hysteroscopy     age 69   mastectomy Bilateral 10/22/2019   pt had necrosis after 1st surgery, had another surgery to remove implants 12/10/2019   MOUTH SURGERY     TUBAL LIGATION     TUBAL LIGATION Bilateral 1998   WISDOM TOOTH EXTRACTION     early 33's    Social History:  Social History   Socioeconomic History   Marital status: Married    Spouse name: Not on file   Number of children: 2   Years of education: Not on file   Highest education level: GED or equivalent  Occupational History   Occupation: Disability  Tobacco Use   Smoking status: Every Day    Current packs/day: 0.50    Average packs/day: 0.5 packs/day for 40.0 years (20.0 ttl pk-yrs)    Types: Cigarettes   Smokeless tobacco: Never   Tobacco comments:    Smokes half a pack a day for 40 years.   Vaping Use   Vaping status: Never Used  Substance and Sexual Activity   Alcohol use: Never   Drug use: Never   Sexual activity: Yes  Other Topics Concern   Not on file  Social History Narrative   Lives with her husband.    Social Determinants of Health   Financial Resource Strain: Medium Risk (06/06/2023)   Overall Financial Resource Strain (CARDIA)    Difficulty  of Paying Living Expenses: Somewhat hard  Food Insecurity: Food Insecurity Present (06/06/2023)   Hunger Vital Sign    Worried About Running Out of Food in the Last Year: Sometimes true    Ran Out of Food in the Last Year: Sometimes true  Transportation Needs: No Transportation Needs (06/06/2023)   PRAPARE - Administrator, Civil Service (Medical): No    Lack of Transportation (Non-Medical): No  Physical Activity: Insufficiently Active (06/06/2023)   Exercise Vital Sign    Days of Exercise per Week: 2 days    Minutes of Exercise per Session: 20 min  Stress: Stress Concern Present (06/06/2023)   [employer] of Occupational Health - Occupational Stress Questionnaire    Feeling of Stress : Rather much  Social Connections: Unknown (06/06/2023)   Social Connection and Isolation Panel [NHANES]    Frequency of Communication with Friends and Family: Twice a week    Frequency of Social Gatherings with Friends and Family: Once a week    Attends Religious Services: Patient declined    Database administrator or Organizations: No    Attends Banker Meetings: Not on file    Marital Status: Married  Intimate Partner Violence: Not At Risk (03/04/2022)   Received from [facility]   Humiliation, Afraid, Rape, and Kick questionnaire    Fear of Current or Ex-Partner: No    Emotionally Abused: No    Physically Abused: No    Sexually Abused: No    Family History: Family History  Problem Relation Age of Onset   Leukemia Mother    Breast cancer Sister    Diabetes Maternal Uncle    Hyperlipidemia Maternal Uncle    Diabetes Mellitus II Maternal Grandmother    Heart disease Maternal Grandfather    Diabetes Maternal Grandfather    Breast cancer Cousin    Breast cancer Half-Sister    Osteoporosis Half-Sister    Lupus Half-Sister    Lupus Half-Sister    Osteoporosis Half-Sister    Cancer - Lung Neg Hx    Colon cancer Neg  Hx     Current Medications:  Current Outpatient Medications:    amLODipine (NORVASC) 5 MG tablet, TAKE 1 TABLET(5 MG) BY MOUTH DAILY, Disp: 90 tablet, Rfl: 2   Ascorbic Acid (VITAMIN C) 1000 MG tablet, Take 1,000 mg by mouth daily., Disp: , Rfl:    aspirin 81 MG EC  tablet, Take 81 mg by mouth daily., Disp: , Rfl:    betamethasone dipropionate 0.05 % cream, Apply topically 2 (two) times daily., Disp: 30 g, Rfl: 1   butalbital-acetaminophen-caffeine (FIORICET) 50-325-40 MG tablet, Take 1 tablet by mouth every 4 (four) hours as needed for headache., Disp: 30 tablet, Rfl: 0   Calcium-Magnesium-Vitamin D (CALCIUM 1200+D3 PO), Take 1 tablet by mouth daily., Disp: , Rfl:    cetirizine-pseudoephedrine (ZYRTEC-D) 5-120 MG tablet, Take 1 tablet by mouth daily., Disp: , Rfl:    CINNAMON PO, Take 1,000 mg by mouth daily., Disp: , Rfl:    cyanocobalamin 1000 MCG tablet, Take 1 tablet (1,000 mcg total) by mouth daily. (Patient taking differently: Take 1,000 mcg by mouth every other day.), Disp: 30 tablet, Rfl: 6   cyclobenzaprine (FLEXERIL) 10 MG tablet, Take 1 tablet (10 mg total) by mouth 2 (two) times daily as needed for muscle spasms., Disp: 60 tablet, Rfl: 5   denosumab (XGEVA) 120 MG/1.7ML SOLN injection, Inject 120 mg into the skin every 30 (thirty) days., Disp: , Rfl:    dexamethasone (DECADRON) 0.5 MG/5ML solution, Take 0.5 mg by mouth daily., Disp: , Rfl:    dexamethasone 0.5 mg/5 mL - diphenhydrAMINE 12.5 mg/5 mL oral solution 2:1 mixture, Take 10 mLs by mouth 2 (two) times daily., Disp: 480 mL, Rfl: 2   exemestane (AROMASIN) 25 MG tablet, Take 1 tablet (25 mg total) by mouth daily after breakfast., Disp: 90 tablet, Rfl: 1   furosemide (LASIX) 20 MG tablet, Take 1 tablet (20 mg total) by mouth daily as needed for edema (Take daily as needed for edema)., Disp: 30 tablet, Rfl: 0   HYDROcodone-acetaminophen (NORCO) 5-325 MG tablet, Take 1 tablet by mouth every 12 (twelve) hours as needed for moderate  pain., Disp: 30 tablet, Rfl: 0   lansoprazole (PREVACID) 30 MG capsule, TAKE 1 CAPSULE BY MOUTH IN THE MORNING AND AT BEDTIME, Disp: 120 capsule, Rfl: 3   Misc. Devices MISC, Please provide patient with left arm compression sleeve. Diagnosis metastatic breast cancer, bilateral mastectomy, left arm lymphedema, Disp: 1 each, Rfl: 0   NAPHCON-A 0.025-0.3 % ophthalmic solution, Place 2 drops into both eyes in the morning and at bedtime., Disp: , Rfl:    ribociclib succ (KISQALI, 600 MG DOSE,) 200 MG Therapy Pack, Take 3 tablets (600 mg total) by mouth daily. Take for 21 days on, 7 days off, repeat every 28 days., Disp: 63 tablet, Rfl: 3   pregabalin (LYRICA) 25 MG capsule, Take 1 capsule (25 mg total) by mouth 2 (two) times daily., Disp: 60 capsule, Rfl: 2   Allergies: Allergies  Allergen Reactions   Augmentin [Amoxicillin-Pot Clavulanate] Anaphylaxis   Ciprofloxacin Anaphylaxis   Claritin [Loratadine] Anaphylaxis   Gabapentin Anaphylaxis   Keflex [Cephalexin] Anaphylaxis   Levofloxacin Anaphylaxis   Lisinopril Swelling   Lisinopril Anaphylaxis   Metoprolol Swelling   Metoprolol Anaphylaxis   Nexium [Esomeprazole] Anaphylaxis   Sulfa Antibiotics Hives and Swelling   Tetracyclines & Related Anaphylaxis   Iohexol Hives    Needs pre meds   Ultram [Tramadol] Other (See Comments)    Patient states medication irritates throat    Ceftin [Cefuroxime Axetil] Hives   Chromium Other (See Comments)    headache   Iodinated Contrast Media Hives   Misc. Sulfonamide Containing Compounds Other (See Comments)   Oxycodone Itching   Sulfa Antibiotics     Possible Reaction    Latex Rash    itching   Nickel Rash  itching    REVIEW OF SYSTEMS:   Review of Systems  Constitutional:  Negative for chills, fatigue and fever.  HENT:   Negative for lump/mass, mouth sores, nosebleeds, sore throat and trouble swallowing.   Eyes:  Negative for eye problems.  Respiratory:  Positive for cough and shortness  of breath.   Cardiovascular:  Positive for palpitations. Negative for chest pain and leg swelling.  Gastrointestinal:  Positive for constipation. Negative for abdominal pain, diarrhea, nausea and vomiting.  Genitourinary:  Negative for bladder incontinence, difficulty urinating, dysuria, frequency, hematuria and nocturia.   Musculoskeletal:  Negative for arthralgias, back pain, flank pain, myalgias and neck pain.  Skin:  Negative for itching and rash.  Neurological:  Positive for numbness. Negative for dizziness and headaches.  Hematological:  Does not bruise/bleed easily.  Psychiatric/Behavioral:  Positive for sleep disturbance. Negative for depression and suicidal ideas. The patient is not nervous/anxious.   All other systems reviewed and are negative.    VITALS:   Blood pressure 127/79, pulse 87, temperature 98.4 F (36.9 C), temperature source Oral, resp. rate 18, height 5\' 4"  (1.626 m), weight 186 lb (84.4 kg), last menstrual period 06/08/2012, SpO2 95%.  Wt Readings from Last 3 Encounters:  11/05/23 186 lb (84.4 kg)  10/21/23 188 lb 4.8 oz (85.4 kg)  10/17/23 187 lb 3.2 oz (84.9 kg)    Body mass index is 31.93 kg/m.  Performance status (ECOG): 1 - Symptomatic but completely ambulatory  PHYSICAL EXAM:   Physical Exam Vitals and nursing note reviewed. Exam conducted with a chaperone present.  Constitutional:      Appearance: Normal appearance.  Cardiovascular:     Rate and Rhythm: Normal rate and regular rhythm.     Pulses: Normal pulses.     Heart sounds: Normal heart sounds.  Pulmonary:     Effort: Pulmonary effort is normal.     Breath sounds: Normal breath sounds.  Abdominal:     Palpations: Abdomen is soft. There is no hepatomegaly, splenomegaly or mass.     Tenderness: There is no abdominal tenderness.  Musculoskeletal:     Right lower leg: No edema.     Left lower leg: No edema.  Lymphadenopathy:     Cervical: No cervical adenopathy.     Right cervical: No  superficial, deep or posterior cervical adenopathy.    Left cervical: No superficial, deep or posterior cervical adenopathy.     Upper Body:     Right upper body: No supraclavicular or axillary adenopathy.     Left upper body: No supraclavicular or axillary adenopathy.  Neurological:     General: No focal deficit present.     Mental Status: She is alert and oriented to person, place, and time.  Psychiatric:        Mood and Affect: Mood normal.        Behavior: Behavior normal.        LABS:      Latest Ref Rng & Units 10/29/2023   11:00 AM 10/08/2023   12:19 PM 09/09/2023   12:05 PM  CBC  WBC 4.0 - 10.5 K/uL 5.0  4.7  3.3   Hemoglobin 12.0 - 15.0 g/dL 19.1  47.8  29.5   Hematocrit 36.0 - 46.0 % 43.5  41.9  41.0   Platelets 150 - 400 K/uL 311  390  366       Latest Ref Rng & Units 10/29/2023   11:00 AM 10/08/2023   12:19 PM 09/09/2023   12:05  PM  CMP  Glucose 70 - 99 mg/dL 956  88  213   BUN 8 - 23 mg/dL 18  18  13    Creatinine 0.44 - 1.00 mg/dL 0.86  5.78  4.69   Sodium 135 - 145 mmol/L 140  138  138   Potassium 3.5 - 5.1 mmol/L 3.3  4.3  3.8   Chloride 98 - 111 mmol/L 105  104  106   CO2 22 - 32 mmol/L 23  23  23    Calcium 8.9 - 10.3 mg/dL 9.6  9.7  9.2   Total Protein 6.5 - 8.1 g/dL 7.2  7.5  7.8   Total Bilirubin 0.3 - 1.2 mg/dL 0.7  0.8  0.8   Alkaline Phos 38 - 126 U/L 60  65  65   AST 15 - 41 U/L 27  29  27    ALT 0 - 44 U/L 37  32  36      No results found for: "CEA1", "CEA" / No results found for: "CEA1", "CEA" No results found for: "PSA1" No results found for: "GEX528" No results found for: "CAN125"  No results found for: "TOTALPROTELP", "ALBUMINELP", "A1GS", "A2GS", "BETS", "BETA2SER", "GAMS", "MSPIKE", "SPEI" No results found for: "TIBC", "FERRITIN", "IRONPCTSAT" No results found for: "LDH"   STUDIES:   NM PET Image Restag (PS) Skull Base To Thigh  Result Date: 11/04/2023 CLINICAL DATA:  Subsequent treatment strategy for breast cancer. EXAM:  NUCLEAR MEDICINE PET SKULL BASE TO THIGH TECHNIQUE: 10.12 mCi F-18 FDG was injected intravenously. Full-ring PET imaging was performed from the skull base to thigh after the radiotracer. CT data was obtained and used for attenuation correction and anatomic localization. Fasting blood glucose: 96 mg/dl COMPARISON:  04/12/23 FINDINGS: Mediastinal blood pool activity: SUV max 2.06 Liver activity: SUV max NA NECK: No hypermetabolic lymph nodes in the neck. Incidental CT findings: None. CHEST: There are no tracer avid axillary, supraclavicular, mediastinal, or hilar lymph nodes. No tracer avid pulmonary nodules or mass. Incidental CT findings: Emphysema. Calcified granulomas identified. Subpleural scarring and fibrosis within the anterior and lateral left upper lobe likely reflecting changes secondary to external beam radiation. Aortic atherosclerosis. Calcified left hilar lymph nodes. ABDOMEN/PELVIS: No abnormal hypermetabolic activity within the liver, pancreas, adrenal glands, or spleen. No hypermetabolic lymph nodes in the abdomen or pelvis. Incidental CT findings: Aortic atherosclerotic calcifications. SKELETON: -Persistent increased radiotracer uptake within the sclerotic T9 vertebral metastasis has an SUV max 7.03. Formally the SUV max was equal to 7.4. -Increased tracer uptake within the sclerotic T7 vertebral metastasis has an SUV max of 4.51. This is compared with 5.47 previously. -Additional sclerotic metastasis are identified without corresponding increased uptake compatible with treated disease. Incidental CT findings: Post treatment change is again identified within the left chest wall compatible with prior mastectomy. IMPRESSION: 1. Stable exam. Persistent increased radiotracer uptake within the sclerotic T9 and T7 vertebral metastasis. 2. No new or progressive sites of disease identified. 3. Aortic Atherosclerosis (ICD10-I70.0) and Emphysema (ICD10-J43.9). Electronically Signed   By: Signa Kell M.D.    On: 11/04/2023 12:46

## 2023-11-05 NOTE — Progress Notes (Signed)
Patient tolerated  Xgeva injection with no complaints voiced.  Site clean and dry with no bruising or swelling noted at site.  See MAR for details.  Band aid applied.  Patient stable during and after injection.  Vss with discharge and left in satisfactory condition with no s/s of distress noted. All follow ups as scheduled.   Cecily Lawhorne [employer]

## 2023-11-07 ENCOUNTER — Other Ambulatory Visit (HOSPITAL_COMMUNITY): Payer: Self-pay

## 2023-11-07 ENCOUNTER — Other Ambulatory Visit: Payer: Self-pay

## 2023-11-07 NOTE — Progress Notes (Signed)
Specialty Pharmacy Ongoing Clinical Assessment Note  Wendy Zamora is a 62 y.o. female who is being followed by the specialty pharmacy service for RxSp Oncology   Patient's specialty medication(s) reviewed today: Ribociclib Succinate   Missed doses in the last 4 weeks: 0   Patient/Caregiver did not have any additional questions or concerns.   Therapeutic benefit summary: Unable to assess   Adverse events/side effects summary: Experienced adverse events/side effects (Pt having leg redness, irritation, and burning.)   Patient's therapy is appropriate to: Hold (Provider recommended to hold Kisqali until next appt on 12/18.)    Goals Addressed             This Visit's Progress    Stabilization of disease       Patient is  currently holding Kisqali . Patient will be evaluated at upcoming provider appointment to assess progress. Patient reports redness, irritation, and burning in her legs. Prescriber recommended to hold therapy until next appt on 12/17/23. Patient has started Lyrica 25mg  BID.          Follow up:  6 months  Bobette Mo Specialty Pharmacist

## 2023-11-10 ENCOUNTER — Other Ambulatory Visit: Payer: Self-pay

## 2023-11-10 NOTE — Progress Notes (Signed)
Clinical Intervention Note  Clinical Intervention Notes: Patient called back to ask if she can return Kisqali to the refrigerator from room temperature since her therapy is on hold to extend the life of the product past the 2 months time recommended by the manufacturer. Reached out to [employer] on patient behalf and was informed that it is not recommended to return to the refrigerator and the product should only be used for 2 months after it was placed at room temperature. Called patient back to advise and she will request refill at her next provider appointment if she is to resume treatment.   Clinical Intervention Outcomes: Improved therapy effectiveness   Otto Herb Specialty Pharmacist

## 2023-11-11 ENCOUNTER — Telehealth (HOSPITAL_COMMUNITY): Payer: Self-pay | Admitting: *Deleted

## 2023-11-11 NOTE — Telephone Encounter (Signed)
Patient called into the clinic today stating her legs continue to sting and burn. Patient also stated her legs are very dry and have a snake-like appearance to them and they are flaky. In Dr.K's previous note he stated pt should get a dermatology evaluation, but pt stated she couldn't afford to  see the dermatologist because Medicaid doesn't pay for it. Dr.K made aware and stated for patient to continue to hold Kisqail and continue to use Steriod cream and call back to the clinic to see if the rash is any better. Tried to call patient back and but no answer. Will call patient tomorrow to update her on what Dr.K recommended.

## 2023-11-12 NOTE — Telephone Encounter (Signed)
Per Dr. Denver Faster, patient notified to continue to stay off of Kisquali and use steroid cream to legs, in addition stop exemestane.  I will follow up with patient next week to assess if symptoms have resolved.  Patient verbalized understanding.

## 2023-11-19 ENCOUNTER — Telehealth: Payer: Self-pay | Admitting: *Deleted

## 2023-11-19 NOTE — Telephone Encounter (Signed)
Patient called to update on status of bilateral leg rash, redness and peeling.  Despite being off of Kisquali and Exemestane, taking Medrol dose pack and using hydrocortisone cream, symptoms are continuing.  Appointment made with Dr, Anders Simmonds tomorrow for assessment.

## 2023-11-20 ENCOUNTER — Inpatient Hospital Stay: Payer: Medicaid Other

## 2023-11-20 ENCOUNTER — Inpatient Hospital Stay: Payer: Medicaid Other | Admitting: Oncology

## 2023-11-20 VITALS — BP 153/90 | HR 86 | Temp 98.2°F | Resp 16 | Wt 186.9 lb

## 2023-11-20 DIAGNOSIS — R300 Dysuria: Secondary | ICD-10-CM

## 2023-11-20 DIAGNOSIS — Z170 Estrogen receptor positive status [ER+]: Secondary | ICD-10-CM | POA: Diagnosis not present

## 2023-11-20 DIAGNOSIS — Z79811 Long term (current) use of aromatase inhibitors: Secondary | ICD-10-CM | POA: Diagnosis not present

## 2023-11-20 DIAGNOSIS — G629 Polyneuropathy, unspecified: Secondary | ICD-10-CM | POA: Diagnosis not present

## 2023-11-20 DIAGNOSIS — Z7952 Long term (current) use of systemic steroids: Secondary | ICD-10-CM | POA: Diagnosis not present

## 2023-11-20 DIAGNOSIS — Z7982 Long term (current) use of aspirin: Secondary | ICD-10-CM | POA: Diagnosis not present

## 2023-11-20 DIAGNOSIS — R21 Rash and other nonspecific skin eruption: Secondary | ICD-10-CM

## 2023-11-20 DIAGNOSIS — C50912 Malignant neoplasm of unspecified site of left female breast: Secondary | ICD-10-CM | POA: Diagnosis not present

## 2023-11-20 DIAGNOSIS — C7951 Secondary malignant neoplasm of bone: Secondary | ICD-10-CM | POA: Diagnosis not present

## 2023-11-20 DIAGNOSIS — Z79899 Other long term (current) drug therapy: Secondary | ICD-10-CM | POA: Diagnosis not present

## 2023-11-20 DIAGNOSIS — Z9013 Acquired absence of bilateral breasts and nipples: Secondary | ICD-10-CM | POA: Diagnosis not present

## 2023-11-20 DIAGNOSIS — Z803 Family history of malignant neoplasm of breast: Secondary | ICD-10-CM | POA: Diagnosis not present

## 2023-11-20 DIAGNOSIS — M7989 Other specified soft tissue disorders: Secondary | ICD-10-CM | POA: Diagnosis not present

## 2023-11-20 DIAGNOSIS — F17210 Nicotine dependence, cigarettes, uncomplicated: Secondary | ICD-10-CM | POA: Diagnosis not present

## 2023-11-20 LAB — URINALYSIS, ROUTINE W REFLEX MICROSCOPIC
Bilirubin Urine: NEGATIVE
Glucose, UA: NEGATIVE mg/dL
Hgb urine dipstick: NEGATIVE
Ketones, ur: NEGATIVE mg/dL
Leukocytes,Ua: NEGATIVE
Nitrite: NEGATIVE
Protein, ur: NEGATIVE mg/dL
Specific Gravity, Urine: 1.01 (ref 1.005–1.030)
pH: 6 (ref 5.0–8.0)

## 2023-11-20 MED ORDER — BETAMETHASONE DIPROPIONATE 0.05 % EX CREA: TOPICAL_CREAM | Freq: Two times a day (BID) | CUTANEOUS | 1 refills | Status: DC

## 2023-11-20 NOTE — Progress Notes (Signed)
[facility] [address], [place] [address]    Clinic Day:  11/20/2023  Referring physician: Billie Lade, MD  Patient Care Team: Billie Lade, MD as PCP - General (Internal Medicine) [name], RN as Oncology Nurse Navigator (Oncology) [name], MD as Medical Oncologist (Medical Oncology)   ASSESSMENT & PLAN:   Assessment: 1.  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 - Started on Ibrance and Faslodex in December 2021 for metastatic disease. - PET scan on 08/10/2021 with no evidence of hypermetabolic metastatic disease.  Widespread scattered sclerotic bone metastasis in the spine and pelvis are stable and not metabolically active.  No lymphadenopathy.  Asymmetric muscular uptake in the left lower scalene muscles and in the intercostal muscles of the ventral upper left chest wall without discrete mass correlate on the CT images, favoring activity related uptake. Wendy Zamora was dose reduced to 100 mg 3 weeks on/1 week off on 10/11/2021 due to fatigue.  Discontinued on 07/23/2023 due to progression - Ribociclib and exemestane started on 08/07/2023- 10/20/23-- held for rash - PET scan 10/30/2023: Stable exam with persistent increased radiotracer uptake in the sclerotic T9 and T7 vertebral bodies, slightly better compared to the previous scan from July.  2.  Social/family history: - She is a retired Engineer, structural.  She recently moved back from [place]. - Younger sister had breast cancer in her 48s.  Mother died of leukemia at age 49.  Paternal cousin also had breast cancer.  3.  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  She underwent immediate implants which were later removed on 12/10/2019 secondary to infection and necrosis. - XRT to the chest wall and regional lymph nodes, 60 Gray, from 02/29/2020 through 04/12/2020 - She was treated with adjuvant anastrozole which was started 12/09/2019 and was found to have metastatic disease in November 2021. - Previous genetic testing was reportedly negative and [place].    Plan: 1.  Metastatic breast cancer to the bones, ER/PR positive, HER2 negative: - She was on ribociclib and exemestane. - She came to our clinic with erythematous maculopapular rash on the legs which started around 10/13/2023 and was seen on 10/17/2023, treated with prednisone 50 mg x 3 days.  She called as on 10/20/2023 with burning rash.  Ribociclib was held on 10/20/2023.  She was evaluated and was given Medrol Dosepak and steroid cream.  She reported that rash started to improve on 10/23/2023.  She could not start the Medrol Dosepak until 10/24/2023. - She reported that rash is coming back more starting Monday.  Today there is very faint rash on the legs.  No rash on the rest of the body.  She reports burning sensation in the legs. - Recommend continue to hold Kisqali. - Recommend dermatology evaluation. But patient does not want to follow because she is worried that her insurance might not cover. - Continue to use topical steroids - RTC 5 weeks for follow-up.  2.  Peripheral neuropathy: - She has constant tingling and numbness in the hands and feet which is stable.  She reports burning sensation in the legs. - She could not tolerate Cymbalta and gabapentin in the past. - Will start her on Lyrica 25 mg 1-2 times daily.  3.   Bone metastatic disease: - Continue denosumab today and monthly.  Calcium is 9.6 today.  4.  Leg swellings: - Continue Lasix as needed.   Orders Placed This Encounter  Procedures   Urinalysis, Routine w reflex microscopic    Standing Status:   Future    Number of Occurrences:   1    Standing Expiration Date:   11/19/2024   The total time spent in the appointment was 20 minutes encounter with patients including review of chart and various tests results, discussions about plan of care and coordination of care plan    All questions were answered. The patient knows to call the clinic with any problems, questions or concerns. No barriers to learning was detected.     Cindie Crumbly, MD   11/21/202412:56 PM  CHIEF COMPLAINT:   Diagnosis: metastatic breast cancer    Cancer Staging  No matching staging information was found for the patient.    Prior Therapy: Palbociclib and fulvestrant  Current Therapy: Ribociclib and exemestane every 4 weeks    HISTORY OF PRESENT ILLNESS:   Oncology History  Primary malignant neoplasm of breast with metastasis (HCC)  07/06/2021 Initial Diagnosis   Metastatic breast cancer (HCC)   07/18/2021 - 08/15/2022 Chemotherapy   Patient is on Treatment Plan : BREAST Palbociclib + Fulvestrant q28d     07/18/2021 -  Chemotherapy   Patient is on Treatment Plan : BREAST Palbociclib D1-21 + Fulvestrant q28d        INTERVAL HISTORY:   Wendy Zamora is a 62 y.o. female seen for follow-up of metastatic breast cancer. Since her last visit, her rash on her legs has improved. She also reports a burning sensation in her urine and has agreed to a urine test.  In addition to the rash, she has been experiencing pain in her lower back and right leg. She has been taking pregabalin (Lyrica) which has helped with the stinging and burning sensation in her legs. She also takes Benadryl at night which, in combination with the Lyrica, has improved her sleep.  Overall she has  been doing better than her last visit  PAST MEDICAL HISTORY:   Past Medical History: Past Medical History:  Diagnosis Date   Allergy    Asthma    Asthma    diagnosed age 41   Bone metastasis 08/2020   was on prolia   Breast cancer (HCC)    started chemo 05/05/2019. finished 08/2019   Gangrene (HCC)    GERD (gastroesophageal reflux disease)    Hx of bilateral mastectomy 10/22/2019   Hypertension    Hypertension    started bp meds 2008   Lymphedema of arm    left arm   Osteopenia    Shingles 04/20/2020   left side    Surgical History: Past Surgical History:  Procedure Laterality Date   BIOPSY  01/22/2022   Procedure: BIOPSY;  Surgeon: Marguerita Merles, Reuel Boom, MD;  Location: [facility];  Service: Gastroenterology;;   Fidela Salisbury RELEASE Bilateral 2006   ESOPHAGOGASTRODUODENOSCOPY (EGD) WITH PROPOFOL N/A 01/22/2022   Procedure: ESOPHAGOGASTRODUODENOSCOPY (EGD) WITH PROPOFOL;  Surgeon: Dolores Frame, MD;  Location: [facility];  Service: Gastroenterology;  Laterality: N/A;  205  hysteroscopy     age 68   mastectomy Bilateral 10/22/2019   pt had necrosis after 1st surgery, had another surgery to remove implants 12/10/2019   MOUTH SURGERY     TUBAL LIGATION     TUBAL LIGATION Bilateral 1998   WISDOM TOOTH EXTRACTION     early 27's    Social History: Social History   Socioeconomic History   Marital status: Married    Spouse name: Not on file   Number of children: 2   Years of education: Not on file   Highest education level: GED or equivalent  Occupational History   Occupation: Disability  Tobacco Use   Smoking status: Every Day    Current packs/day: 0.50    Average packs/day: 0.5 packs/day for 40.0 years (20.0 ttl pk-yrs)    Types: Cigarettes   Smokeless tobacco: Never   Tobacco comments:    Smokes half a pack a day for 40 years.   Vaping Use   Vaping status: Never Used  Substance and Sexual Activity   Alcohol use: Never   Drug use:  Never   Sexual activity: Yes  Other Topics Concern   Not on file  Social History Narrative   Lives with her husband.    Social Determinants of Health   Financial Resource Strain: Medium Risk (06/06/2023)   Overall Financial Resource Strain (CARDIA)    Difficulty of Paying Living Expenses: Somewhat hard  Food Insecurity: Food Insecurity Present (06/06/2023)   Hunger Vital Sign    Worried About Running Out of Food in the Last Year: Sometimes true    Ran Out of Food in the Last Year: Sometimes true  Transportation Needs: No Transportation Needs (06/06/2023)   PRAPARE - Administrator, Civil Service (Medical): No    Lack of Transportation (Non-Medical): No  Physical Activity: Insufficiently Active (06/06/2023)   Exercise Vital Sign    Days of Exercise per Week: 2 days    Minutes of Exercise per Session: 20 min  Stress: Stress Concern Present (06/06/2023)   [employer] of Occupational Health - Occupational Stress Questionnaire    Feeling of Stress : Rather much  Social Connections: Unknown (06/06/2023)   Social Connection and Isolation Panel [NHANES]    Frequency of Communication with Friends and Family: Twice a week    Frequency of Social Gatherings with Friends and Family: Once a week    Attends Religious Services: Patient declined    Database administrator or Organizations: No    Attends Banker Meetings: Not on file    Marital Status: Married  Intimate Partner Violence: Not At Risk (03/04/2022)   Received from [facility]   Humiliation, Afraid, Rape, and Kick questionnaire    Fear of Current or Ex-Partner: No    Emotionally Abused: No    Physically Abused: No    Sexually Abused: No    Family History: Family History  Problem Relation Age of Onset   Leukemia Mother    Breast cancer Sister    Diabetes Maternal Uncle    Hyperlipidemia Maternal Uncle    Diabetes Mellitus II Maternal Grandmother     Heart disease Maternal Grandfather    Diabetes Maternal Grandfather    Breast cancer Cousin    Breast cancer Half-Sister    Osteoporosis Half-Sister    Lupus Half-Sister    Lupus Half-Sister    Osteoporosis Half-Sister    Cancer - Lung Neg Hx  Colon cancer Neg Hx     Current Medications:  Current Outpatient Medications:    amLODipine (NORVASC) 5 MG tablet, TAKE 1 TABLET(5 MG) BY MOUTH DAILY, Disp: 90 tablet, Rfl: 2   Ascorbic Acid (VITAMIN C) 1000 MG tablet, Take 1,000 mg by mouth daily., Disp: , Rfl:    aspirin 81 MG EC tablet, Take 81 mg by mouth daily., Disp: , Rfl:    butalbital-acetaminophen-caffeine (FIORICET) 50-325-40 MG tablet, Take 1 tablet by mouth every 4 (four) hours as needed for headache., Disp: 30 tablet, Rfl: 0   Calcium-Magnesium-Vitamin D (CALCIUM 1200+D3 PO), Take 1 tablet by mouth daily., Disp: , Rfl:    cetirizine-pseudoephedrine (ZYRTEC-D) 5-120 MG tablet, Take 1 tablet by mouth daily., Disp: , Rfl:    CINNAMON PO, Take 1,000 mg by mouth daily., Disp: , Rfl:    cyanocobalamin 1000 MCG tablet, Take 1 tablet (1,000 mcg total) by mouth daily. (Patient taking differently: Take 1,000 mcg by mouth every other day.), Disp: 30 tablet, Rfl: 6   cyclobenzaprine (FLEXERIL) 10 MG tablet, Take 1 tablet (10 mg total) by mouth 2 (two) times daily as needed for muscle spasms., Disp: 60 tablet, Rfl: 5   denosumab (XGEVA) 120 MG/1.7ML SOLN injection, Inject 120 mg into the skin every 30 (thirty) days., Disp: , Rfl:    dexamethasone (DECADRON) 0.5 MG/5ML solution, Take 0.5 mg by mouth daily., Disp: , Rfl:    dexamethasone 0.5 mg/5 mL - diphenhydrAMINE 12.5 mg/5 mL oral solution 2:1 mixture, Take 10 mLs by mouth 2 (two) times daily., Disp: 480 mL, Rfl: 2   furosemide (LASIX) 20 MG tablet, Take 1 tablet (20 mg total) by mouth daily as needed for edema (Take daily as needed for edema)., Disp: 30 tablet, Rfl: 0   HYDROcodone-acetaminophen (NORCO) 5-325 MG tablet, Take 1 tablet by mouth  every 12 (twelve) hours as needed for moderate pain., Disp: 30 tablet, Rfl: 0   lansoprazole (PREVACID) 30 MG capsule, TAKE 1 CAPSULE BY MOUTH IN THE MORNING AND AT BEDTIME, Disp: 120 capsule, Rfl: 3   Misc. Devices MISC, Please provide patient with left arm compression sleeve. Diagnosis metastatic breast cancer, bilateral mastectomy, left arm lymphedema, Disp: 1 each, Rfl: 0   NAPHCON-A 0.025-0.3 % ophthalmic solution, Place 2 drops into both eyes in the morning and at bedtime., Disp: , Rfl:    pregabalin (LYRICA) 25 MG capsule, Take 1 capsule (25 mg total) by mouth 2 (two) times daily., Disp: 60 capsule, Rfl: 2   betamethasone dipropionate 0.05 % cream, Apply topically 2 (two) times daily., Disp: 30 g, Rfl: 1   exemestane (AROMASIN) 25 MG tablet, Take 1 tablet (25 mg total) by mouth daily after breakfast. (Patient not taking: Reported on 11/20/2023), Disp: 90 tablet, Rfl: 1   ribociclib succ (KISQALI, 600 MG DOSE,) 200 MG Therapy Pack, Take 3 tablets (600 mg total) by mouth daily. Take for 21 days on, 7 days off, repeat every 28 days. (Patient not taking: Reported on 11/20/2023), Disp: 63 tablet, Rfl: 3   Allergies: Allergies  Allergen Reactions   Augmentin [Amoxicillin-Pot Clavulanate] Anaphylaxis   Ciprofloxacin Anaphylaxis   Claritin [Loratadine] Anaphylaxis   Gabapentin Anaphylaxis   Keflex [Cephalexin] Anaphylaxis   Levofloxacin Anaphylaxis   Lisinopril Swelling   Lisinopril Anaphylaxis   Metoprolol Swelling   Metoprolol Anaphylaxis   Nexium [Esomeprazole] Anaphylaxis   Sulfa Antibiotics Hives and Swelling   Tetracyclines & Related Anaphylaxis   Iohexol Hives    Needs pre meds   Ultram [Tramadol]  Other (See Comments)    Patient states medication irritates throat    Ceftin [Cefuroxime Axetil] Hives   Chromium Other (See Comments)    headache   Iodinated Contrast Media Hives   Misc. Sulfonamide Containing Compounds Other (See Comments)   Oxycodone Itching   Sulfa Antibiotics      Possible Reaction    Latex Rash    itching   Nickel Rash    itching    REVIEW OF SYSTEMS:   Review of Systems  Constitutional:  Negative for chills, fatigue and fever.  HENT:   Negative for lump/mass, mouth sores, nosebleeds, sore throat and trouble swallowing.   Eyes:  Negative for eye problems.  Respiratory:  Negative for cough and shortness of breath.   Cardiovascular:  Negative for chest pain, leg swelling and palpitations.  Gastrointestinal:  Positive for constipation. Negative for abdominal pain, diarrhea, nausea and vomiting.  Genitourinary:  Negative for bladder incontinence, difficulty urinating, dysuria, frequency, hematuria and nocturia.   Musculoskeletal:  Negative for arthralgias, back pain, flank pain, myalgias and neck pain.  Skin:  Positive for rash. Negative for itching.  Neurological:  Positive for numbness. Negative for dizziness and headaches.  Hematological:  Does not bruise/bleed easily.  Psychiatric/Behavioral:  Negative for depression, sleep disturbance and suicidal ideas. The patient is not nervous/anxious.   All other systems reviewed and are negative.    VITALS:   Blood pressure (!) 153/90, pulse 86, temperature 98.2 F (36.8 C), temperature source Oral, resp. rate 16, weight 186 lb 15.2 oz (84.8 kg), last menstrual period 06/08/2012, SpO2 98%.  Wt Readings from Last 3 Encounters:  11/20/23 186 lb 15.2 oz (84.8 kg)  11/05/23 186 lb (84.4 kg)  10/21/23 188 lb 4.8 oz (85.4 kg)    Body mass index is 32.09 kg/m.  Performance status (ECOG): 1 - Symptomatic but completely ambulatory  PHYSICAL EXAM:   GENERAL:alert, no distress and comfortable SKIN: erythematous papular rash on B/L legs at various degrees of healing LUNGS: clear to auscultation and percussion with normal breathing effort HEART: regular rate & rhythm and no murmurs and no lower extremity edema ABDOMEN:abdomen soft, non-tender and normal bowel sounds Musculoskeletal:no cyanosis of  digits and no clubbing  PSYCH: alert & oriented x 3 with fluent speech  LABS:      Latest Ref Rng & Units 10/29/2023   11:00 AM 10/08/2023   12:19 PM 09/09/2023   12:05 PM  CBC  WBC 4.0 - 10.5 K/uL 5.0  4.7  3.3   Hemoglobin 12.0 - 15.0 g/dL 29.5  62.1  30.8   Hematocrit 36.0 - 46.0 % 43.5  41.9  41.0   Platelets 150 - 400 K/uL 311  390  366       Latest Ref Rng & Units 10/29/2023   11:00 AM 10/08/2023   12:19 PM 09/09/2023   12:05 PM  CMP  Glucose 70 - 99 mg/dL 657  88  846   BUN 8 - 23 mg/dL 18  18  13    Creatinine 0.44 - 1.00 mg/dL 9.62  9.52  8.41   Sodium 135 - 145 mmol/L 140  138  138   Potassium 3.5 - 5.1 mmol/L 3.3  4.3  3.8   Chloride 98 - 111 mmol/L 105  104  106   CO2 22 - 32 mmol/L 23  23  23    Calcium 8.9 - 10.3 mg/dL 9.6  9.7  9.2   Total Protein 6.5 - 8.1 g/dL 7.2  7.5  7.8   Total Bilirubin 0.3 - 1.2 mg/dL 0.7  0.8  0.8   Alkaline Phos 38 - 126 U/L 60  65  65   AST 15 - 41 U/L 27  29  27    ALT 0 - 44 U/L 37  32  36

## 2023-11-20 NOTE — Patient Instructions (Signed)
VISIT SUMMARY:  Wendy Zamora, during your visit today, we discussed several health concerns including a persistent rash on your legs, lower back and right leg pain, and a burning sensation during urination. We also addressed your concerns about pausing your cancer medications due to the rash.  YOUR PLAN:  -DRUG-INDUCED RASH: A drug-induced rash is a skin reaction caused by medication. Your rash has improved with the use of Betamethasone cream, which you should continue applying twice daily. We may consider increasing the dose if needed. To help with dryness, apply lotion a couple of hours after using the steroid cream.  -BREAST CANCER WITH SPINAL METASTASIS: Breast cancer with spinal metastasis means that the cancer has spread to your spine. You are currently off Kisqali and an estrogen blocker due to the rash. We will discuss with Dr. Kirtland Bouchard upon his return about resuming your treatment or exploring alternative options.  -LOWER BACK AND RIGHT LEG PAIN: Your lower back and right leg pain may be due to nerve impingement from spinal lesions. Pregabalin (Lyrica) has been helping with the pain, so please continue taking it as prescribed.  -POSSIBLE URINARY TRACT INFECTION: A urinary tract infection (UTI) is an infection in any part of your urinary system. You reported a burning sensation during urination, so we have ordered a urine test to check for an infection.  INSTRUCTIONS:  Please continue using Betamethasone cream twice daily and apply lotion a couple of hours after the steroid cream. Keep taking Pregabalin (Lyrica) for pain management and continue with Benadryl at night if it helps you sleep. We have ordered a urine test to check for a possible urinary tract infection. Follow up with Dr. Kirtland Bouchard in a couple of weeks to discuss your cancer treatment options.

## 2023-11-24 DIAGNOSIS — R1310 Dysphagia, unspecified: Secondary | ICD-10-CM | POA: Diagnosis not present

## 2023-11-24 DIAGNOSIS — R12 Heartburn: Secondary | ICD-10-CM | POA: Diagnosis not present

## 2023-11-24 DIAGNOSIS — R1319 Other dysphagia: Secondary | ICD-10-CM | POA: Diagnosis not present

## 2023-11-24 DIAGNOSIS — K449 Diaphragmatic hernia without obstruction or gangrene: Secondary | ICD-10-CM | POA: Diagnosis not present

## 2023-11-25 DIAGNOSIS — K449 Diaphragmatic hernia without obstruction or gangrene: Secondary | ICD-10-CM | POA: Diagnosis not present

## 2023-11-25 DIAGNOSIS — R12 Heartburn: Secondary | ICD-10-CM | POA: Diagnosis not present

## 2023-11-25 DIAGNOSIS — R1319 Other dysphagia: Secondary | ICD-10-CM | POA: Diagnosis not present

## 2023-11-26 ENCOUNTER — Telehealth (INDEPENDENT_AMBULATORY_CARE_PROVIDER_SITE_OTHER): Payer: Self-pay | Admitting: Gastroenterology

## 2023-11-26 NOTE — Telephone Encounter (Signed)
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  Patient reported after she had the test done her dysphagia has resolved and is not having complaints.  We will hold off on repeating any endoscopies for now unless she has recurrent dysphagia.

## 2023-12-03 ENCOUNTER — Inpatient Hospital Stay: Payer: Medicaid Other

## 2023-12-03 ENCOUNTER — Inpatient Hospital Stay: Payer: Medicaid Other | Attending: Hematology

## 2023-12-03 VITALS — BP 138/81 | HR 74 | Temp 97.3°F | Resp 18

## 2023-12-03 DIAGNOSIS — Z803 Family history of malignant neoplasm of breast: Secondary | ICD-10-CM | POA: Diagnosis not present

## 2023-12-03 DIAGNOSIS — G629 Polyneuropathy, unspecified: Secondary | ICD-10-CM | POA: Diagnosis not present

## 2023-12-03 DIAGNOSIS — I10 Essential (primary) hypertension: Secondary | ICD-10-CM | POA: Diagnosis not present

## 2023-12-03 DIAGNOSIS — Z7982 Long term (current) use of aspirin: Secondary | ICD-10-CM | POA: Insufficient documentation

## 2023-12-03 DIAGNOSIS — Z79899 Other long term (current) drug therapy: Secondary | ICD-10-CM | POA: Insufficient documentation

## 2023-12-03 DIAGNOSIS — F17210 Nicotine dependence, cigarettes, uncomplicated: Secondary | ICD-10-CM | POA: Insufficient documentation

## 2023-12-03 DIAGNOSIS — C7951 Secondary malignant neoplasm of bone: Secondary | ICD-10-CM | POA: Insufficient documentation

## 2023-12-03 DIAGNOSIS — M8580 Other specified disorders of bone density and structure, unspecified site: Secondary | ICD-10-CM | POA: Insufficient documentation

## 2023-12-03 DIAGNOSIS — Z170 Estrogen receptor positive status [ER+]: Secondary | ICD-10-CM | POA: Diagnosis not present

## 2023-12-03 DIAGNOSIS — C50912 Malignant neoplasm of unspecified site of left female breast: Secondary | ICD-10-CM | POA: Insufficient documentation

## 2023-12-03 DIAGNOSIS — C50919 Malignant neoplasm of unspecified site of unspecified female breast: Secondary | ICD-10-CM

## 2023-12-03 DIAGNOSIS — Z79811 Long term (current) use of aromatase inhibitors: Secondary | ICD-10-CM | POA: Diagnosis not present

## 2023-12-03 LAB — CBC WITH DIFFERENTIAL/PLATELET
Abs Immature Granulocytes: 0.02 10*3/uL (ref 0.00–0.07)
Basophils Absolute: 0.1 10*3/uL (ref 0.0–0.1)
Basophils Relative: 1 %
Eosinophils Absolute: 0.1 10*3/uL (ref 0.0–0.5)
Eosinophils Relative: 2 %
HCT: 43.1 % (ref 36.0–46.0)
Hemoglobin: 15 g/dL (ref 12.0–15.0)
Immature Granulocytes: 0 %
Lymphocytes Relative: 30 %
Lymphs Abs: 1.6 10*3/uL (ref 0.7–4.0)
MCH: 34.8 pg — ABNORMAL HIGH (ref 26.0–34.0)
MCHC: 34.8 g/dL (ref 30.0–36.0)
MCV: 100 fL (ref 80.0–100.0)
Monocytes Absolute: 0.6 10*3/uL (ref 0.1–1.0)
Monocytes Relative: 10 %
Neutro Abs: 3.1 10*3/uL (ref 1.7–7.7)
Neutrophils Relative %: 57 %
Platelets: 301 10*3/uL (ref 150–400)
RBC: 4.31 MIL/uL (ref 3.87–5.11)
RDW: 13.1 % (ref 11.5–15.5)
WBC: 5.5 10*3/uL (ref 4.0–10.5)
nRBC: 0 % (ref 0.0–0.2)

## 2023-12-03 LAB — COMPREHENSIVE METABOLIC PANEL
ALT: 46 U/L — ABNORMAL HIGH (ref 0–44)
AST: 42 U/L — ABNORMAL HIGH (ref 15–41)
Albumin: 4.1 g/dL (ref 3.5–5.0)
Alkaline Phosphatase: 59 U/L (ref 38–126)
Anion gap: 11 (ref 5–15)
BUN: 15 mg/dL (ref 8–23)
CO2: 21 mmol/L — ABNORMAL LOW (ref 22–32)
Calcium: 9.9 mg/dL (ref 8.9–10.3)
Chloride: 106 mmol/L (ref 98–111)
Creatinine, Ser: 0.89 mg/dL (ref 0.44–1.00)
GFR, Estimated: >60 mL/min (ref >60)
Glucose, Bld: 98 mg/dL (ref 70–99)
Potassium: 3.7 mmol/L (ref 3.5–5.1)
Sodium: 138 mmol/L (ref 135–145)
Total Bilirubin: 0.5 mg/dL (ref <1.2)
Total Protein: 7.6 g/dL (ref 6.5–8.1)

## 2023-12-03 LAB — MAGNESIUM: Magnesium: 2.1 mg/dL (ref 1.7–2.4)

## 2023-12-03 MED ORDER — DENOSUMAB 120 MG/1.7ML ~~LOC~~ SOLN
120.0000 mg | Freq: Once | SUBCUTANEOUS | Status: AC
  Administered 2023-12-03: 120 mg via SUBCUTANEOUS
  Filled 2023-12-03: qty 1.7

## 2023-12-03 NOTE — Progress Notes (Signed)
Wendy Zamora presents today for injection per the provider's orders.  Xgeva 120 mg administration without incident; injection site WNL; see MAR for injection details.  Patient tolerated procedure well and without incident.  No questions or complaints noted at this time. Patient's Calcium noted to be 9.9 today.  Patient denies any tooth or jaw pain noted, no recent or future dental appointment at this this. Patient reports taking Vit D and Calcium supplements as directed.  Discharged from clinic ambulatory in stable condition. Alert and oriented x 3. F/U with [facility] as scheduled.

## 2023-12-03 NOTE — Patient Instructions (Signed)
[facility] [place] - A DEPT OF [facility]  Discharge Instructions: Thank you for choosing [facility] to provide your oncology and hematology care.  If you have a lab appointment with the Cancer Center - please note that after April 8th, 2024, all labs will be drawn in the cancer center.  You do not have to check in or register with the main entrance as you have in the past but will complete your check-in in the cancer center.  Wear comfortable clothing and clothing appropriate for easy access to any Portacath or PICC line.   We strive to give you quality time with your provider. You may need to reschedule your appointment if you arrive late (15 or more minutes).  Arriving late affects you and other patients whose appointments are after yours.  Also, if you miss three or more appointments without notifying the office, you may be dismissed from the clinic at the provider's discretion.      For prescription refill requests, have your pharmacy contact our office and allow 72 hours for refills to be completed.    Today you received Xgeva injection    BELOW ARE SYMPTOMS THAT SHOULD BE REPORTED IMMEDIATELY: *FEVER GREATER THAN 100.4 F (38 C) OR HIGHER *CHILLS OR SWEATING *NAUSEA AND VOMITING THAT IS NOT CONTROLLED WITH YOUR NAUSEA MEDICATION *UNUSUAL SHORTNESS OF BREATH *UNUSUAL BRUISING OR BLEEDING *URINARY PROBLEMS (pain or burning when urinating, or frequent urination) *BOWEL PROBLEMS (unusual diarrhea, constipation, pain near the anus) TENDERNESS IN MOUTH AND THROAT WITH OR WITHOUT PRESENCE OF ULCERS (sore throat, sores in mouth, or a toothache) UNUSUAL RASH, SWELLING OR PAIN  UNUSUAL VAGINAL DISCHARGE OR ITCHING   Items with * indicate a potential emergency and should be followed up as soon as possible or go to the Emergency Department if any problems should occur.  Please show the CHEMOTHERAPY ALERT CARD or IMMUNOTHERAPY ALERT CARD at check-in to  the Emergency Department and triage nurse.  Should you have questions after your visit or need to cancel or reschedule your appointment, please contact [facility] [place] - A DEPT OF [facility] [phone]  and follow the prompts.  Office hours are 8:00 a.m. to 4:30 p.m. Monday - Friday. Please note that voicemails left after 4:00 p.m. may not be returned until the following business day.  We are closed weekends and major holidays. You have access to a nurse at all times for urgent questions. Please call the main number to the clinic [phone] and follow the prompts.  For any non-urgent questions, you may also contact your provider using MyChart. We now offer e-Visits for anyone 28 and older to request care online for non-urgent symptoms. For details visit [email or web address].   Also download the MyChart app! Go to the app store, search "MyChart", open the app, select [place], and log in with your MyChart username and password.

## 2023-12-04 LAB — CANCER ANTIGEN 27.29: CA 27.29: 48.4 U/mL — ABNORMAL HIGH (ref 0.0–38.6)

## 2023-12-04 LAB — CANCER ANTIGEN 15-3: CA 15-3: 34.4 U/mL — ABNORMAL HIGH (ref 0.0–25.0)

## 2023-12-17 ENCOUNTER — Other Ambulatory Visit: Payer: Self-pay

## 2023-12-17 ENCOUNTER — Inpatient Hospital Stay: Payer: Medicaid Other | Admitting: Hematology

## 2023-12-17 ENCOUNTER — Other Ambulatory Visit (HOSPITAL_COMMUNITY): Payer: Self-pay

## 2023-12-17 ENCOUNTER — Other Ambulatory Visit: Payer: Self-pay | Admitting: *Deleted

## 2023-12-17 VITALS — BP 138/72 | HR 89 | Temp 98.2°F | Resp 18 | Ht 64.5 in | Wt 183.0 lb

## 2023-12-17 DIAGNOSIS — C50919 Malignant neoplasm of unspecified site of unspecified female breast: Secondary | ICD-10-CM | POA: Diagnosis not present

## 2023-12-17 DIAGNOSIS — C7951 Secondary malignant neoplasm of bone: Secondary | ICD-10-CM | POA: Diagnosis not present

## 2023-12-17 DIAGNOSIS — Z803 Family history of malignant neoplasm of breast: Secondary | ICD-10-CM | POA: Diagnosis not present

## 2023-12-17 DIAGNOSIS — C50912 Malignant neoplasm of unspecified site of left female breast: Secondary | ICD-10-CM | POA: Diagnosis not present

## 2023-12-17 DIAGNOSIS — Z170 Estrogen receptor positive status [ER+]: Secondary | ICD-10-CM | POA: Diagnosis not present

## 2023-12-17 DIAGNOSIS — Z79899 Other long term (current) drug therapy: Secondary | ICD-10-CM | POA: Diagnosis not present

## 2023-12-17 DIAGNOSIS — M8580 Other specified disorders of bone density and structure, unspecified site: Secondary | ICD-10-CM | POA: Diagnosis not present

## 2023-12-17 DIAGNOSIS — G629 Polyneuropathy, unspecified: Secondary | ICD-10-CM | POA: Diagnosis not present

## 2023-12-17 DIAGNOSIS — I10 Essential (primary) hypertension: Secondary | ICD-10-CM | POA: Diagnosis not present

## 2023-12-17 DIAGNOSIS — Z7982 Long term (current) use of aspirin: Secondary | ICD-10-CM | POA: Diagnosis not present

## 2023-12-17 DIAGNOSIS — F17210 Nicotine dependence, cigarettes, uncomplicated: Secondary | ICD-10-CM | POA: Diagnosis not present

## 2023-12-17 DIAGNOSIS — Z79811 Long term (current) use of aromatase inhibitors: Secondary | ICD-10-CM | POA: Diagnosis not present

## 2023-12-17 MED ORDER — EXEMESTANE 25 MG PO TABS: 25.0000 mg | ORAL_TABLET | Freq: Every day | ORAL | 1 refills | Status: DC

## 2023-12-17 MED ORDER — DIPHENHYDRAMINE HCL 12.5 MG/5ML PO LIQD: 10.0000 mL | Freq: Two times a day (BID) | ORAL | 2 refills | Status: DC

## 2023-12-17 MED ORDER — RIBOCICLIB SUCC (600 MG DOSE) 200 MG PO TBPK
600.0000 mg | ORAL_TABLET | Freq: Every day | ORAL | 3 refills | Status: DC
  Filled 2023-12-17: qty 63, 21d supply, fill #0
  Filled 2023-12-17: qty 63, 28d supply, fill #0

## 2023-12-17 NOTE — Progress Notes (Signed)
[facility] [address] [place] [address]    Clinic Day:  12/17/2023  Referring physician: Billie Lade, MD  Patient Care Team: Billie Lade, MD as PCP - General (Internal Medicine) [name], RN as Oncology Nurse Navigator (Oncology) [name], MD as Medical Oncologist (Medical Oncology)   ASSESSMENT & PLAN:   Assessment: 1.  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 - Started on Ibrance and Faslodex in December 2021 for metastatic disease. - PET scan on 08/10/2021 with no evidence of hypermetabolic metastatic disease.  Widespread scattered sclerotic bone metastasis in the spine and pelvis are stable and not metabolically active.  No lymphadenopathy.  Asymmetric muscular uptake in the left lower scalene muscles and in the intercostal muscles of the ventral upper left chest wall without discrete mass correlate on the CT images, favoring activity related uptake. Wendy Zamora was dose reduced to 100 mg 3 weeks on/1 week off on 10/11/2021 due to fatigue.  Discontinued on 07/23/2023 due to progression - Ribociclib and exemestane started on 08/07/2023- 10/20/23-- held for rash - PET scan 10/30/2023: Stable exam with persistent increased radiotracer uptake in the sclerotic T9 and T7 vertebral bodies, slightly better compared to the previous scan from July.  2.  Social/family history: - She is a retired Engineer, structural.  She recently moved back from [place]. - Younger sister had breast cancer in her 56s.  Mother died of leukemia at age 29.  Paternal cousin also had breast cancer.  3.  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  She underwent immediate implants which were later removed on 12/10/2019 secondary to infection and necrosis. - XRT to the chest wall and regional lymph nodes, 60 Gray, from 02/29/2020 through 04/12/2020 - She was treated with adjuvant anastrozole which was started 12/09/2019 and was found to have metastatic disease in November 2021. - Previous genetic testing was reportedly negative and [place].    Plan: 1.  Metastatic breast cancer to the bones, ER/PR positive, HER2 negative: - She had a case Quale on 10/20/2023 due to rash.  Exemestane was held on 11/05/2023. - She did not see a dermatologist as Medicaid does not cover it. - She has used steroid cream given by [place].  Rash is better. - I have recommended that she start back on exemestane.  However she thought it caused her mouth ulcers. - Will start on anastrozole.  She will start case Quale next week after she gets the prescription filled. - RTC 4 weeks for follow-up.  2.  Peripheral neuropathy: - She has constant tingling and numbness in the hands and feet which is stable.  The burning sensation in the legs is also stable. - Continue Lyrica 25 mg twice daily.  She will start back on Flexeril and will discontinue it if she feels excessively drowsy.  3.  Bone metastatic disease: - Continue denosumab monthly.  Calcium is 9.9.  4.  Leg swellings: - Continue Lasix daily  as needed.    No orders of the defined types were placed in this encounter.     I,Katie Daubenspeck,acting as a Neurosurgeon for [name], MD.,have documented all relevant documentation on the behalf of [name], MD,as directed by  [name], MD while in the presence of  [name], MD.   I, [name] MD, have reviewed the above documentation for accuracy and completeness, and I agree with the above.   [name], MD   12/18/20246:39 PM  CHIEF COMPLAINT:   Diagnosis: metastatic breast cancer    Cancer Staging  No matching staging information was found for the patient.    Prior Therapy: 1. Palbociclib and fulvestrant, 11/2020 - 07/23/23 2. Ribociclib every 4 weeks, 08/07/23 - 10/20/23, held for rash  Current Therapy:  exemestane   HISTORY OF PRESENT ILLNESS:   Oncology History  Primary malignant neoplasm of breast with metastasis (HCC)  07/06/2021 Initial Diagnosis   Metastatic breast cancer (HCC)   07/18/2021 - 08/15/2022 Chemotherapy   Patient is on Treatment Plan : BREAST Palbociclib + Fulvestrant q28d     07/18/2021 -  Chemotherapy   Patient is on Treatment Plan : BREAST Palbociclib D1-21 + Fulvestrant q28d        INTERVAL HISTORY:   Wendy Zamora is a 62 y.o. female presenting to clinic today for follow up of metastatic breast cancer. She was last seen by Dr. Anders Simmonds on 11/20/23.  Today, she states that she is doing well overall. Her appetite level is at 60%. Her energy level is at 60%.  PAST MEDICAL HISTORY:   Past Medical History: Past Medical History:  Diagnosis Date   Allergy    Asthma    Asthma    diagnosed age 45   Bone metastasis 08/2020   was on prolia   Breast cancer (HCC)    started chemo 05/05/2019. finished 08/2019   Gangrene (HCC)    GERD (gastroesophageal reflux disease)    Hx of bilateral mastectomy 10/22/2019   Hypertension    Hypertension    started bp meds 2008   Lymphedema of arm    left arm   Osteopenia    Shingles 04/20/2020   left side    Surgical History: Past Surgical History:  Procedure Laterality Date   BIOPSY  01/22/2022   Procedure: BIOPSY;  Surgeon: Marguerita Merles, Reuel Boom, MD;  [facility]: [facility];  Service: Gastroenterology;;   Fidela Salisbury RELEASE Bilateral  2006   ESOPHAGOGASTRODUODENOSCOPY (EGD) WITH PROPOFOL N/A 01/22/2022   Procedure: ESOPHAGOGASTRODUODENOSCOPY (EGD) WITH PROPOFOL;  Surgeon: Dolores Frame, MD;  Location: [facility];  Service: Gastroenterology;  Laterality: N/A;  205   hysteroscopy     age 18   mastectomy Bilateral 10/22/2019   pt had necrosis after 1st surgery, had another surgery to remove implants 12/10/2019   MOUTH SURGERY     TUBAL LIGATION     TUBAL LIGATION Bilateral 1998   WISDOM TOOTH EXTRACTION     early 74's    Social History: Social History   Socioeconomic History   Marital status: Married    Spouse name: Not on file   Number of children: 2   Years of education: Not on file   Highest education level: GED or equivalent  Occupational History   Occupation: Disability  Tobacco Use   Smoking status: Every Day    Current packs/day: 0.50    Average packs/day: 0.5 packs/day for 40.0 years (20.0 ttl pk-yrs)    Types: Cigarettes   Smokeless  tobacco: Never   Tobacco comments:    Smokes half a pack a day for 40 years.   Vaping Use   Vaping status: Never Used  Substance and Sexual Activity   Alcohol use: Never   Drug use: Never   Sexual activity: Yes  Other Topics Concern   Not on file  Social History Narrative   Lives with her husband.    Social Drivers of Health   Financial Resource Strain: Medium Risk (06/06/2023)   Overall Financial Resource Strain (CARDIA)    Difficulty of Paying Living Expenses: Somewhat hard  Food Insecurity: Food Insecurity Present (06/06/2023)   Hunger Vital Sign    Worried About Running Out of Food in the Last Year: Sometimes true    Ran Out of Food in the Last Year: Sometimes true  Transportation Needs: No Transportation Needs (06/06/2023)   PRAPARE - Administrator, Civil Service (Medical): No    Lack of Transportation (Non-Medical): No  Physical Activity: Insufficiently Active (06/06/2023)   Exercise Vital Sign    Days of Exercise per Week: 2 days     Minutes of Exercise per Session: 20 min  Stress: Stress Concern Present (06/06/2023)   [employer] of Occupational Health - Occupational Stress Questionnaire    Feeling of Stress : Rather much  Social Connections: Unknown (06/06/2023)   Social Connection and Isolation Panel [NHANES]    Frequency of Communication with Friends and Family: Twice a week    Frequency of Social Gatherings with Friends and Family: Once a week    Attends Religious Services: Patient declined    Database administrator or Organizations: No    Attends Banker Meetings: Not on file    Marital Status: Married  Intimate Partner Violence: Not At Risk (03/04/2022)   Received from [facility]   Humiliation, Afraid, Rape, and Kick questionnaire    Fear of Current or Ex-Partner: No    Emotionally Abused: No    Physically Abused: No    Sexually Abused: No    Family History: Family History  Problem Relation Age of Onset   Leukemia Mother    Breast cancer Sister    Diabetes Maternal Uncle    Hyperlipidemia Maternal Uncle    Diabetes Mellitus II Maternal Grandmother    Heart disease Maternal Grandfather    Diabetes Maternal Grandfather    Breast cancer Cousin    Breast cancer Half-Sister    Osteoporosis Half-Sister    Lupus Half-Sister    Lupus Half-Sister    Osteoporosis Half-Sister    Cancer - Lung Neg Hx    Colon cancer Neg Hx     Current Medications:  Current Outpatient Medications:    amLODipine (NORVASC) 5 MG tablet, TAKE 1 TABLET(5 MG) BY MOUTH DAILY, Disp: 90 tablet, Rfl: 2   Ascorbic Acid (VITAMIN C) 1000 MG tablet, Take 1,000 mg by mouth daily., Disp: , Rfl:    aspirin 81 MG EC tablet, Take 81 mg by mouth daily., Disp: , Rfl:    betamethasone dipropionate 0.05 % cream, Apply topically 2 (two) times daily., Disp: 30 g, Rfl: 1   butalbital-acetaminophen-caffeine (FIORICET) 50-325-40 MG tablet, Take 1 tablet by mouth every 4  (four) hours as needed for headache., Disp: 30 tablet, Rfl: 0   Calcium-Magnesium-Vitamin D (CALCIUM 1200+D3 PO), Take 1 tablet by mouth daily., Disp: , Rfl:    cetirizine-pseudoephedrine (ZYRTEC-D) 5-120 MG tablet, Take 1 tablet by mouth daily.,  Disp: , Rfl:    CINNAMON PO, Take 1,000 mg by mouth daily., Disp: , Rfl:    cyanocobalamin 1000 MCG tablet, Take 1 tablet (1,000 mcg total) by mouth daily. (Patient taking differently: Take 1,000 mcg by mouth every other day.), Disp: 30 tablet, Rfl: 6   cyclobenzaprine (FLEXERIL) 10 MG tablet, Take 1 tablet (10 mg total) by mouth 2 (two) times daily as needed for muscle spasms., Disp: 60 tablet, Rfl: 5   denosumab (XGEVA) 120 MG/1.7ML SOLN injection, Inject 120 mg into the skin every 30 (thirty) days., Disp: , Rfl:    dexamethasone (DECADRON) 0.5 MG/5ML solution, Take 0.5 mg by mouth daily., Disp: , Rfl:    furosemide (LASIX) 20 MG tablet, Take 1 tablet (20 mg total) by mouth daily as needed for edema (Take daily as needed for edema)., Disp: 30 tablet, Rfl: 0   HYDROcodone-acetaminophen (NORCO) 5-325 MG tablet, Take 1 tablet by mouth every 12 (twelve) hours as needed for moderate pain., Disp: 30 tablet, Rfl: 0   lansoprazole (PREVACID) 30 MG capsule, TAKE 1 CAPSULE BY MOUTH IN THE MORNING AND AT BEDTIME, Disp: 120 capsule, Rfl: 3   Misc. Devices MISC, Please provide patient with left arm compression sleeve. Diagnosis metastatic breast cancer, bilateral mastectomy, left arm lymphedema, Disp: 1 each, Rfl: 0   NAPHCON-A 0.025-0.3 % ophthalmic solution, Place 2 drops into both eyes in the morning and at bedtime., Disp: , Rfl:    pregabalin (LYRICA) 25 MG capsule, Take 1 capsule (25 mg total) by mouth 2 (two) times daily., Disp: 60 capsule, Rfl: 2   dexamethasone 0.5 mg/5 mL - diphenhydrAMINE 12.5 mg/5 mL oral solution 2:1 mixture, Take 10 mLs by mouth 2 (two) times daily., Disp: 480 mL, Rfl: 2   exemestane (AROMASIN) 25 MG tablet, Take 1 tablet (25 mg total) by  mouth daily after breakfast., Disp: 90 tablet, Rfl: 1   ribociclib succ (KISQALI, 600 MG DOSE,) 200 MG Therapy Pack, Take 3 tablets (600 mg total) by mouth daily. Take for 21 days on, 7 days off, repeat every 28 days., Disp: 63 tablet, Rfl: 3   Allergies: Allergies  Allergen Reactions   Augmentin [Amoxicillin-Pot Clavulanate] Anaphylaxis   Ciprofloxacin Anaphylaxis   Claritin [Loratadine] Anaphylaxis   Gabapentin Anaphylaxis   Keflex [Cephalexin] Anaphylaxis   Levofloxacin Anaphylaxis   Lisinopril Swelling   Lisinopril Anaphylaxis   Metoprolol Swelling   Metoprolol Anaphylaxis   Nexium [Esomeprazole] Anaphylaxis   Sulfa Antibiotics Hives and Swelling   Tetracyclines & Related Anaphylaxis   Iohexol Hives    Needs pre meds   Ultram [Tramadol] Other (See Comments)    Patient states medication irritates throat    Ceftin [Cefuroxime Axetil] Hives   Chromium Other (See Comments)    headache   Iodinated Contrast Media Hives   Misc. Sulfonamide Containing Compounds Other (See Comments)   Oxycodone Itching   Sulfa Antibiotics     Possible Reaction    Latex Rash    itching   Nickel Rash    itching    REVIEW OF SYSTEMS:   Review of Systems  Constitutional:  Negative for chills, fatigue and fever.  HENT:   Negative for lump/mass, mouth sores, nosebleeds, sore throat and trouble swallowing.   Eyes:  Negative for eye problems.  Respiratory:  Positive for shortness of breath. Negative for cough.   Cardiovascular:  Negative for chest pain, leg swelling and palpitations.  Gastrointestinal:  Positive for constipation. Negative for abdominal pain, diarrhea, nausea and vomiting.  Genitourinary:  Negative for bladder incontinence, difficulty urinating, dysuria, frequency, hematuria and nocturia.   Musculoskeletal:  Negative for arthralgias, back pain, flank pain, myalgias and neck pain.  Skin:  Negative for itching and rash.  Neurological:  Positive for numbness. Negative for dizziness  and headaches.  Hematological:  Does not bruise/bleed easily.  Psychiatric/Behavioral:  Negative for depression, sleep disturbance and suicidal ideas. The patient is not nervous/anxious.   All other systems reviewed and are negative.    VITALS:   Blood pressure 138/72, pulse 89, temperature 98.2 F (36.8 C), temperature source Tympanic, resp. rate 18, height 5' 4.5" (1.638 m), weight 183 lb (83 kg), last menstrual period 06/08/2012, SpO2 100%.  Wt Readings from Last 3 Encounters:  12/17/23 183 lb (83 kg)  11/20/23 186 lb 15.2 oz (84.8 kg)  11/05/23 186 lb (84.4 kg)    Body mass index is 30.93 kg/m.  Performance status (ECOG): 1 - Symptomatic but completely ambulatory  PHYSICAL EXAM:   Physical Exam Vitals and nursing note reviewed. Exam conducted with a chaperone present.  Constitutional:      Appearance: Normal appearance.  Cardiovascular:     Rate and Rhythm: Normal rate and regular rhythm.     Pulses: Normal pulses.     Heart sounds: Normal heart sounds.  Pulmonary:     Effort: Pulmonary effort is normal.     Breath sounds: Normal breath sounds.  Abdominal:     Palpations: Abdomen is soft. There is no hepatomegaly, splenomegaly or mass.     Tenderness: There is no abdominal tenderness.  Musculoskeletal:     Right lower leg: No edema.     Left lower leg: No edema.  Lymphadenopathy:     Cervical: No cervical adenopathy.     Right cervical: No superficial, deep or posterior cervical adenopathy.    Left cervical: No superficial, deep or posterior cervical adenopathy.     Upper Body:     Right upper body: No supraclavicular or axillary adenopathy.     Left upper body: No supraclavicular or axillary adenopathy.  Neurological:     General: No focal deficit present.     Mental Status: She is alert and oriented to person, place, and time.  Psychiatric:        Mood and Affect: Mood normal.        Behavior: Behavior normal.     LABS:      Latest Ref Rng & Units  12/03/2023    1:53 PM 10/29/2023   11:00 AM 10/08/2023   12:19 PM  CBC  WBC 4.0 - 10.5 K/uL 5.5  5.0  4.7   Hemoglobin 12.0 - 15.0 g/dL 16.1  09.6  04.5   Hematocrit 36.0 - 46.0 % 43.1  43.5  41.9   Platelets 150 - 400 K/uL 301  311  390       Latest Ref Rng & Units 12/03/2023    1:53 PM 10/29/2023   11:00 AM 10/08/2023   12:19 PM  CMP  Glucose 70 - 99 mg/dL 98  409  88   BUN 8 - 23 mg/dL 15  18  18    Creatinine 0.44 - 1.00 mg/dL 8.11  9.14  7.82   Sodium 135 - 145 mmol/L 138  140  138   Potassium 3.5 - 5.1 mmol/L 3.7  3.3  4.3   Chloride 98 - 111 mmol/L 106  105  104   CO2 22 - 32 mmol/L 21  23  23  Calcium 8.9 - 10.3 mg/dL 9.9  9.6  9.7   Total Protein 6.5 - 8.1 g/dL 7.6  7.2  7.5   Total Bilirubin <1.2 mg/dL 0.5  0.7  0.8   Alkaline Phos 38 - 126 U/L 59  60  65   AST 15 - 41 U/L 42  27  29   ALT 0 - 44 U/L 46  37  32      No results found for: "CEA1", "CEA" / No results found for: "CEA1", "CEA" No results found for: "PSA1" No results found for: "ZOX096" No results found for: "CAN125"  No results found for: "TOTALPROTELP", "ALBUMINELP", "A1GS", "A2GS", "BETS", "BETA2SER", "GAMS", "MSPIKE", "SPEI" No results found for: "TIBC", "FERRITIN", "IRONPCTSAT" No results found for: "LDH"   STUDIES:   No results found.

## 2023-12-17 NOTE — Patient Instructions (Addendum)
[facility] at [facility] Discharge Instructions   You were seen and examined today by Dr. Ellin Saba.  He reviewed the results of your recent lab work which are normal/stable. Your tumor markers were unchanged.   Dr. Kirtland Bouchard would like for you to start back on exemestane. Start taking this today and take for 1 week before starting Kisqali. We will send a new prescription to the pharmacy for the [facility].   We will see you back in one month. We will repeat lab work prior to this visit.    Thank you for choosing [facility] at [facility] to provide your oncology and hematology care.  To afford each patient quality time with our provider, please arrive at least 15 minutes before your scheduled appointment time.   If you have a lab appointment with the Cancer Center please come in thru the Main Entrance and check in at the main information desk.  You need to re-schedule your appointment should you arrive 10 or more minutes late.  We strive to give you quality time with our providers, and arriving late affects you and other patients whose appointments are after yours.  Also, if you no show three or more times for appointments you may be dismissed from the clinic at the providers discretion.     Again, thank you for choosing [facility].  Our hope is that these requests will decrease the amount of time that you wait before being seen by our physicians.       _____________________________________________________________  Should you have questions after your visit to [facility], please contact our office at [phone] and follow the prompts.  Our office hours are 8:00 a.m. and 4:30 p.m. Monday - Friday.  Please note that voicemails left after 4:00 p.m. may not be returned until the following business day.  We are closed weekends and major holidays.  You do have access to a nurse 24-7, just call the main number to the clinic  [phone] and do not press any options, hold on the line and a nurse will answer the phone.    For prescription refill requests, have your pharmacy contact our office and allow 72 hours.    Due to Covid, you will need to wear a mask upon entering the hospital. If you do not have a mask, a mask will be given to you at the Main Entrance upon arrival. For doctor visits, patients may have 1 support person age 61 or older with them. For treatment visits, patients can not have anyone with them due to social distancing guidelines and our immunocompromised population.

## 2023-12-17 NOTE — Progress Notes (Signed)
Specialty Pharmacy Refill Coordination Note  Wendy Zamora is a 62 y.o. female contacted today regarding refills of specialty medication(s) Ribociclib Succinate (KISQALI 600mg  Daily Dose)   Patient requested Delivery   Delivery date: 12/18/23   Verified address: [address]   [place] [id number]   Medication will be filled on 12/17/23.

## 2023-12-17 NOTE — Progress Notes (Signed)
Updated Specialty Pharmacy delivery outreach note to [employer].

## 2023-12-22 ENCOUNTER — Other Ambulatory Visit: Payer: Self-pay | Admitting: *Deleted

## 2023-12-22 ENCOUNTER — Encounter: Payer: Self-pay | Admitting: *Deleted

## 2023-12-22 MED ORDER — ANASTROZOLE 1 MG PO TABS: 1.0000 mg | ORAL_TABLET | Freq: Every day | ORAL | 6 refills | Status: DC

## 2023-12-22 NOTE — Progress Notes (Signed)
Patient has not been able to tolerate Exemestane due to side effects.  She has been made aware, Per Dr. Ellin Saba to start Anastrozole 1 mg daily along with Kisquali 600 mg 21 days on 7 days off.  Patient verbalized understanding.

## 2023-12-23 ENCOUNTER — Other Ambulatory Visit: Payer: Self-pay | Admitting: *Deleted

## 2023-12-23 DIAGNOSIS — R21 Rash and other nonspecific skin eruption: Secondary | ICD-10-CM

## 2023-12-23 MED ORDER — BETAMETHASONE DIPROPIONATE 0.05 % EX CREA: TOPICAL_CREAM | Freq: Two times a day (BID) | CUTANEOUS | 1 refills | Status: DC

## 2023-12-29 ENCOUNTER — Other Ambulatory Visit: Payer: Self-pay | Admitting: *Deleted

## 2023-12-29 ENCOUNTER — Telehealth: Payer: Self-pay | Admitting: *Deleted

## 2023-12-29 MED ORDER — METHYLPREDNISOLONE 4 MG PO TBPK: ORAL_TABLET | ORAL | 0 refills | Status: DC

## 2023-12-29 MED ORDER — PREDNISONE 50 MG PO TABS
ORAL_TABLET | ORAL | 0 refills | Status: DC
Start: 2023-12-29 — End: 2024-01-14

## 2023-12-29 NOTE — Telephone Encounter (Signed)
Patient called to advise that she has begun having full body hives after taking Kisquali and Anastrozole.  Stopped therapy yesterday.  Per Dr. Ellin Saba, will send in prednisone.  Patient notified and will schedule follow up.

## 2024-01-07 ENCOUNTER — Other Ambulatory Visit: Payer: Self-pay

## 2024-01-09 ENCOUNTER — Other Ambulatory Visit: Payer: Self-pay

## 2024-01-12 ENCOUNTER — Other Ambulatory Visit: Payer: Self-pay

## 2024-01-14 ENCOUNTER — Inpatient Hospital Stay: Payer: Medicaid Other

## 2024-01-14 ENCOUNTER — Other Ambulatory Visit: Payer: Self-pay

## 2024-01-14 ENCOUNTER — Other Ambulatory Visit: Payer: Self-pay | Admitting: *Deleted

## 2024-01-14 ENCOUNTER — Inpatient Hospital Stay: Payer: Medicaid Other | Attending: Hematology | Admitting: Hematology

## 2024-01-14 VITALS — BP 159/72 | HR 91 | Temp 97.6°F | Resp 20 | Wt 180.3 lb

## 2024-01-14 DIAGNOSIS — M8580 Other specified disorders of bone density and structure, unspecified site: Secondary | ICD-10-CM | POA: Diagnosis not present

## 2024-01-14 DIAGNOSIS — C7951 Secondary malignant neoplasm of bone: Secondary | ICD-10-CM | POA: Diagnosis not present

## 2024-01-14 DIAGNOSIS — Z7982 Long term (current) use of aspirin: Secondary | ICD-10-CM | POA: Diagnosis not present

## 2024-01-14 DIAGNOSIS — Z79899 Other long term (current) drug therapy: Secondary | ICD-10-CM | POA: Diagnosis not present

## 2024-01-14 DIAGNOSIS — Z170 Estrogen receptor positive status [ER+]: Secondary | ICD-10-CM | POA: Insufficient documentation

## 2024-01-14 DIAGNOSIS — Z79811 Long term (current) use of aromatase inhibitors: Secondary | ICD-10-CM | POA: Diagnosis not present

## 2024-01-14 DIAGNOSIS — C50912 Malignant neoplasm of unspecified site of left female breast: Secondary | ICD-10-CM | POA: Insufficient documentation

## 2024-01-14 DIAGNOSIS — F17210 Nicotine dependence, cigarettes, uncomplicated: Secondary | ICD-10-CM | POA: Diagnosis not present

## 2024-01-14 DIAGNOSIS — Z9013 Acquired absence of bilateral breasts and nipples: Secondary | ICD-10-CM | POA: Insufficient documentation

## 2024-01-14 DIAGNOSIS — Z7952 Long term (current) use of systemic steroids: Secondary | ICD-10-CM | POA: Diagnosis not present

## 2024-01-14 DIAGNOSIS — R21 Rash and other nonspecific skin eruption: Secondary | ICD-10-CM

## 2024-01-14 DIAGNOSIS — C50919 Malignant neoplasm of unspecified site of unspecified female breast: Secondary | ICD-10-CM

## 2024-01-14 LAB — CBC WITH DIFFERENTIAL/PLATELET
Abs Immature Granulocytes: 0.02 10*3/uL (ref 0.00–0.07)
Basophils Absolute: 0.1 10*3/uL (ref 0.0–0.1)
Basophils Relative: 1 %
Eosinophils Absolute: 0.1 10*3/uL (ref 0.0–0.5)
Eosinophils Relative: 1 %
HCT: 45.4 % (ref 36.0–46.0)
Hemoglobin: 14.9 g/dL (ref 12.0–15.0)
Immature Granulocytes: 0 %
Lymphocytes Relative: 27 %
Lymphs Abs: 1.4 10*3/uL (ref 0.7–4.0)
MCH: 31.6 pg (ref 26.0–34.0)
MCHC: 32.8 g/dL (ref 30.0–36.0)
MCV: 96.2 fL (ref 80.0–100.0)
Monocytes Absolute: 0.4 10*3/uL (ref 0.1–1.0)
Monocytes Relative: 8 %
Neutro Abs: 3.2 10*3/uL (ref 1.7–7.7)
Neutrophils Relative %: 63 %
Platelets: 315 10*3/uL (ref 150–400)
RBC: 4.72 MIL/uL (ref 3.87–5.11)
RDW: 12.8 % (ref 11.5–15.5)
WBC: 5.1 10*3/uL (ref 4.0–10.5)
nRBC: 0 % (ref 0.0–0.2)

## 2024-01-14 LAB — COMPREHENSIVE METABOLIC PANEL
ALT: 33 U/L (ref 0–44)
AST: 30 U/L (ref 15–41)
Albumin: 4 g/dL (ref 3.5–5.0)
Alkaline Phosphatase: 63 U/L (ref 38–126)
Anion gap: 11 (ref 5–15)
BUN: 14 mg/dL (ref 8–23)
CO2: 22 mmol/L (ref 22–32)
Calcium: 10 mg/dL (ref 8.9–10.3)
Chloride: 108 mmol/L (ref 98–111)
Creatinine, Ser: 0.91 mg/dL (ref 0.44–1.00)
GFR, Estimated: >60 mL/min (ref >60)
Glucose, Bld: 134 mg/dL — ABNORMAL HIGH (ref 70–99)
Potassium: 3.7 mmol/L (ref 3.5–5.1)
Sodium: 141 mmol/L (ref 135–145)
Total Bilirubin: 0.8 mg/dL (ref 0.0–1.2)
Total Protein: 7.4 g/dL (ref 6.5–8.1)

## 2024-01-14 LAB — MAGNESIUM: Magnesium: 2.1 mg/dL (ref 1.7–2.4)

## 2024-01-14 LAB — GENETIC SCREENING ORDER

## 2024-01-14 MED ORDER — LETROZOLE 2.5 MG PO TABS: 2.5000 mg | ORAL_TABLET | Freq: Every day | ORAL | 1 refills | Status: DC

## 2024-01-14 MED ORDER — BETAMETHASONE DIPROPIONATE 0.05 % EX CREA: TOPICAL_CREAM | Freq: Two times a day (BID) | CUTANEOUS | 1 refills | Status: DC

## 2024-01-14 MED ORDER — PREDNISONE 50 MG PO TABS: ORAL_TABLET | ORAL | 0 refills | Status: DC

## 2024-01-14 MED ORDER — DENOSUMAB 120 MG/1.7ML ~~LOC~~ SOLN
120.0000 mg | Freq: Once | SUBCUTANEOUS | Status: AC
Start: 2024-01-14 — End: 2024-01-14
  Administered 2024-01-14: 120 mg via SUBCUTANEOUS
  Filled 2024-01-14: qty 1.7

## 2024-01-14 NOTE — Patient Instructions (Addendum)
 [facility] at [facility] Discharge Instructions   You were seen and examined today by Dr. Cheree Cords.  He reviewed the results of your lab work which are normal/stable. Your cancer tumor marker results are pending.   We will obtain more lab work today. We will do genetic testing and a special test to see if there are any mutations to target.   We will see you back in 4 weeks. We will repeat lab work prior to this visit.   Return as scheduled.    Thank you for choosing [facility] at [facility] to provide your oncology and hematology care.  To afford each patient quality time with our provider, please arrive at least 15 minutes before your scheduled appointment time.   If you have a lab appointment with the Cancer Center please come in thru the Main Entrance and check in at the main information desk.  You need to re-schedule your appointment should you arrive 10 or more minutes late.  We strive to give you quality time with our providers, and arriving late affects you and other patients whose appointments are after yours.  Also, if you no show three or more times for appointments you may be dismissed from the clinic at the providers discretion.     Again, thank you for choosing [facility].  Our hope is that these requests will decrease the amount of time that you wait before being seen by our physicians.       _____________________________________________________________  Should you have questions after your visit to [facility], please contact our office at [phone] and follow the prompts.  Our office hours are 8:00 a.m. and 4:30 p.m. Monday - Friday.  Please note that voicemails left after 4:00 p.m. may not be returned until the following business day.  We are closed weekends and major holidays.  You do have access to a nurse 24-7, just call the main number to the clinic [phone] and do not press any options,  hold on the line and a nurse will answer the phone.    For prescription refill requests, have your pharmacy contact our office and allow 72 hours.    Due to Covid, you will need to wear a mask upon entering the hospital. If you do not have a mask, a mask will be given to you at the Main Entrance upon arrival. For doctor visits, patients may have 1 support person age 39 or older with them. For treatment visits, patients can not have anyone with them due to social distancing guidelines and our immunocompromised population.

## 2024-01-14 NOTE — Progress Notes (Signed)
 Message received from [name] patient in the lobby and ready for Xgeva  injection. Patient taking Calcium-Magnesium-Vitamin D  (1200 + D3)  PO daily. Creatinine 0.91 and Calcium 10.0.   Wendy Zamora presents today for injection per the provider's orders.  XGeva  administration without incident; injection site WNL; see MAR for injection details.  Patient tolerated procedure well and without incident.  No questions or complaints noted at this time.  Discharged from clinic ambulatory in stable condition. Alert and oriented x 3. F/U with [facility] as scheduled.

## 2024-01-14 NOTE — Patient Instructions (Signed)
 [facility] - A DEPT OF [facility]  Discharge Instructions: Thank you for choosing [facility] to provide your oncology and hematology care.  If you have a lab appointment with the Cancer Center - please note that after April 8th, 2024, all labs will be drawn in the cancer center.  You do not have to check in or register with the main entrance as you have in the past but will complete your check-in in the cancer center.  Wear comfortable clothing and clothing appropriate for easy access to any Portacath or PICC line.   We strive to give you quality time with your provider. You may need to reschedule your appointment if you arrive late (15 or more minutes).  Arriving late affects you and other patients whose appointments are after yours.  Also, if you miss three or more appointments without notifying the office, you may be dismissed from the clinic at the provider's discretion.      For prescription refill requests, have your pharmacy contact our office and allow 72 hours for refills to be completed.    Today you received the following chemotherapy and/or immunotherapy agents Xgeva injection. Denosumab Injection (Oncology) What is this medication? DENOSUMAB (den oh SUE mab) prevents weakened bones caused by cancer. It may also be used to treat noncancerous bone tumors that cannot be removed by surgery. It can also be used to treat high calcium levels in the blood caused by cancer. It works by blocking a protein that causes bones to break down quickly. This slows down the release of calcium from bones, which lowers calcium levels in your blood. It also makes your bones stronger and less likely to break (fracture). This medicine may be used for other purposes; ask your health care provider or pharmacist if you have questions. COMMON BRAND NAME(S): XGEVA What should I tell my care team before I take this medication? They need to know if you have any of  these conditions: Dental disease Having surgery or tooth extraction Infection Kidney disease Low levels of calcium or vitamin D in the blood Malnutrition On hemodialysis Skin conditions or sensitivity Thyroid or parathyroid disease An unusual reaction to denosumab, other medications, foods, dyes, or preservatives Pregnant or trying to get pregnant Breast-feeding How should I use this medication? This medication is for injection under the skin. It is given by your care team in a hospital or clinic setting. A special MedGuide will be given to you before each treatment. Be sure to read this information carefully each time. Talk to your care team about the use of this medication in children. While it may be prescribed for children as young as 13 years for selected conditions, precautions do apply. Overdosage: If you think you have taken too much of this medicine contact a poison control center or emergency room at once. NOTE: This medicine is only for you. Do not share this medicine with others. What if I miss a dose? Keep appointments for follow-up doses. It is important not to miss your dose. Call your care team if you are unable to keep an appointment. What may interact with this medication? Do not take this medication with any of the following: Other medications containing denosumab This medication may also interact with the following: Medications that lower your chance of fighting infection Steroid medications, such as prednisone or cortisone This list may not describe all possible interactions. Give your health care provider a list of all the  medicines, herbs, non-prescription drugs, or dietary supplements you use. Also tell them if you smoke, drink alcohol, or use illegal drugs. Some items may interact with your medicine. What should I watch for while using this medication? Your condition will be monitored carefully while you are receiving this medication. You may need blood work  while taking this medication. This medication may increase your risk of getting an infection. Call your care team for advice if you get a fever, chills, sore throat, or other symptoms of a cold or flu. Do not treat yourself. Try to avoid being around people who are sick. You should make sure you get enough calcium and vitamin D while you are taking this medication, unless your care team tells you not to. Discuss the foods you eat and the vitamins you take with your care team. Some people who take this medication have severe bone, joint, or muscle pain. This medication may also increase your risk for jaw problems or a broken thigh bone. Tell your care team right away if you have severe pain in your jaw, bones, joints, or muscles. Tell your care team if you have any pain that does not go away or that gets worse. Talk to your care team if you may be pregnant. Serious birth defects can occur if you take this medication during pregnancy and for 5 months after the last dose. You will need a negative pregnancy test before starting this medication. Contraception is recommended while taking this medication and for 5 months after the last dose. Your care team can help you find the option that works for you. What side effects may I notice from receiving this medication? Side effects that you should report to your care team as soon as possible: Allergic reactions--skin rash, itching, hives, swelling of the face, lips, tongue, or throat Bone, joint, or muscle pain Low calcium level--muscle pain or cramps, confusion, tingling, or numbness in the hands or feet Osteonecrosis of the jaw--pain, swelling, or redness in the mouth, numbness of the jaw, poor healing after dental work, unusual discharge from the mouth, visible bones in the mouth Side effects that usually do not require medical attention (report to your care team if they continue or are bothersome): Cough Diarrhea Fatigue Headache Nausea This list may not  describe all possible side effects. Call your doctor for medical advice about side effects. You may report side effects to FDA at 1-800-FDA-1088. Where should I keep my medication? This medication is given in a hospital or clinic. It will not be stored at home. NOTE: This sheet is a summary. It may not cover all possible information. If you have questions about this medicine, talk to your doctor, pharmacist, or health care provider.  2024 Elsevier/Gold Standard (2022-05-08 00:00:00)      To help prevent nausea and vomiting after your treatment, we encourage you to take your nausea medication as directed.  BELOW ARE SYMPTOMS THAT SHOULD BE REPORTED IMMEDIATELY: *FEVER GREATER THAN 100.4 F (38 C) OR HIGHER *CHILLS OR SWEATING *NAUSEA AND VOMITING THAT IS NOT CONTROLLED WITH YOUR NAUSEA MEDICATION *UNUSUAL SHORTNESS OF BREATH *UNUSUAL BRUISING OR BLEEDING *URINARY PROBLEMS (pain or burning when urinating, or frequent urination) *BOWEL PROBLEMS (unusual diarrhea, constipation, pain near the anus) TENDERNESS IN MOUTH AND THROAT WITH OR WITHOUT PRESENCE OF ULCERS (sore throat, sores in mouth, or a toothache) UNUSUAL RASH, SWELLING OR PAIN  UNUSUAL VAGINAL DISCHARGE OR ITCHING   Items with * indicate a potential emergency and should be followed up as  soon as possible or go to the Emergency Department if any problems should occur.  Please show the CHEMOTHERAPY ALERT CARD or IMMUNOTHERAPY ALERT CARD at check-in to the Emergency Department and triage nurse.  Should you have questions after your visit or need to cancel or reschedule your appointment, please contact [facility] [place] - A DEPT OF [facility] [phone]  and follow the prompts.  Office hours are 8:00 a.m. to 4:30 p.m. Monday - Friday. Please note that voicemails left after 4:00 p.m. may not be returned until the following business day.  We are closed weekends and major holidays. You have access to a nurse at  all times for urgent questions. Please call the main number to the clinic [phone] and follow the prompts.  For any non-urgent questions, you may also contact your provider using MyChart. We now offer e-Visits for anyone 96 and older to request care online for non-urgent symptoms. For details visit [email or web address].   Also download the MyChart app! Go to the app store, search "MyChart", open the app, select [facility], and log in with your MyChart username and password.

## 2024-01-14 NOTE — Progress Notes (Signed)
 [facility] [address] [place] [address]    Clinic Day:  01/14/2024  Referring physician: Tobi Fortes, MD  Patient Care Team: Tobi Fortes, MD as PCP - General (Internal Medicine) Gerhard Knuckles, RN as Oncology Nurse Navigator (Oncology) Paulett Boros, MD as Medical Oncologist (Medical Oncology)   ASSESSMENT & PLAN:   Assessment: 1.  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 - Started on Ibrance  and Faslodex  in December 2021 for metastatic disease. - PET scan on 08/10/2021 with no evidence of hypermetabolic metastatic disease.  Widespread scattered sclerotic bone metastasis in the spine and pelvis are stable and not metabolically active.  No lymphadenopathy.  Asymmetric muscular uptake in the left lower scalene muscles and in the intercostal muscles of the ventral upper left chest wall without discrete mass correlate on the CT images, favoring activity related uptake. - Ibrance  was dose reduced to 100 mg 3 weeks on/1 week off on 10/11/2021 due to fatigue.  Discontinued on 07/23/2023 due to progression - Ribociclib  and exemestane  started on 08/07/2023- 10/20/23-- held for rash - PET scan 10/30/2023: Stable exam with persistent increased radiotracer uptake in the sclerotic T9 and T7 vertebral bodies, slightly better compared to the previous scan from July. - She could not tolerate exemestane  and ribociclib  secondary to hives and rash.  She could not tolerate anastrozole  due to hives. - Letrozole  2.5 mg daily started on 01/14/2024.  2.  Social/family history: - She is a retired Engineer, structural.  She recently moved back from [place]. - Younger sister had breast cancer in her 53s.  Mother died of leukemia at age 46.  Paternal cousin  also had breast cancer.  3.  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  She underwent immediate implants which were later removed on 12/10/2019 secondary to infection and necrosis. - XRT to the chest wall and regional lymph nodes, 60 Gray, from 02/29/2020 through 04/12/2020 - She was treated with adjuvant anastrozole  which was started 12/09/2019 and was found to have metastatic disease in November 2021. - Previous genetic testing was reportedly negative and [place].    Plan: 1.  Metastatic breast cancer to the bones, ER/PR positive, HER2 negative: - She could not tolerate exemestane  and ribociclib .  She could not tolerate single agent anastrozole  as she developed hives all over the body after taking 2 days of it. - Recommend sending Guardant360 today.  Recommend genetics testing. - We have discussed alternative treatment with capecitabine. - We reviewed tumor markers from 12/03/2023 which showed CA 15-3 improved to 34 from 38.  CA 27-29 is stable at 48. - As her tempo of disease is low, we will give a trial of letrozole  2.5 mg daily and see if she can tolerate it and it works.  She is agreeable.  RTC 4 weeks with repeat tumor markers and labs.  2.  Peripheral neuropathy: - She has tingling and numbness in the hands and feet.  Burning sensation is also  stable. - Continue Lyrica  25 mg twice daily.  3.  Bone metastatic disease: - Continue denosumab  monthly.  Calcium is 10.0 today.  4.  Leg swellings: - Continue Lasix  daily as needed.    No orders of the defined types were placed in this encounter.     Paulett Boros, MD   1/15/202511:11 AM  CHIEF COMPLAINT:   Diagnosis: metastatic breast cancer    Cancer Staging  No matching staging information was found for the patient.    Prior Therapy: 1. Palbociclib  and fulvestrant , 11/2020 - 07/23/23 2. Ribociclib  every 4 weeks, 08/07/23 - 10/20/23, held for rash  Current Therapy:  exemestane    HISTORY OF PRESENT ILLNESS:   Oncology History  Primary malignant neoplasm of breast with metastasis (HCC)  07/06/2021 Initial Diagnosis   Metastatic breast cancer (HCC)   07/18/2021 - 08/15/2022 Chemotherapy   Patient is on Treatment Plan : BREAST Palbociclib  + Fulvestrant  q28d     07/18/2021 -  Chemotherapy   Patient is on Treatment Plan : BREAST Palbociclib  D1-21 + Fulvestrant  q28d        INTERVAL HISTORY:   Suzen is a 63 y.o. female seen for follow-up of metastatic breast cancer.  Reports appetite and energy levels at 50%.  Reports low back pain going down the back of the right thigh which is improving.  PAST MEDICAL HISTORY:   Past Medical History: Past Medical History:  Diagnosis Date   Allergy    Asthma    Asthma    diagnosed age 49   Bone metastasis 08/2020   was on prolia    Breast cancer (HCC)    started chemo 05/05/2019. finished 08/2019   Gangrene (HCC)    GERD (gastroesophageal reflux disease)    Hx of bilateral mastectomy 10/22/2019   Hypertension    Hypertension    started bp meds 2008   Lymphedema of arm    left arm   Osteopenia    Shingles 04/20/2020   left side    Surgical History: Past Surgical History:  Procedure Laterality Date   BIOPSY  01/22/2022   Procedure: BIOPSY;  Surgeon: Urban Garden, MD;  Location: [facility];  Service: Gastroenterology;;   Ritta Chessman RELEASE Bilateral 2006   ESOPHAGOGASTRODUODENOSCOPY (EGD) WITH PROPOFOL  N/A 01/22/2022   Procedure: ESOPHAGOGASTRODUODENOSCOPY (EGD) WITH PROPOFOL ;  Surgeon: Urban Garden, MD;  Location: [facility];  Service:  Gastroenterology;  Laterality: N/A;  205   hysteroscopy     age 72   mastectomy Bilateral 10/22/2019   pt had necrosis after 1st surgery, had another surgery to remove implants 12/10/2019   MOUTH SURGERY     TUBAL LIGATION     TUBAL LIGATION Bilateral 1998   WISDOM TOOTH EXTRACTION     early 48's    Social History: Social History   Socioeconomic History   Marital status: Married    Spouse name: Not on file   Number of children: 2   Years of education: Not on file   Highest education level: GED or equivalent  Occupational History   Occupation: Disability  Tobacco Use   Smoking status: Every Day    Current packs/day: 0.50    Average packs/day: 0.5 packs/day for 40.0 years (20.0 ttl pk-yrs)    Types: Cigarettes   Smokeless tobacco: Never   Tobacco comments:    Smokes half a pack a day for 40 years.   Vaping Use   Vaping status: Never Used  Substance and Sexual Activity   Alcohol  use: Never   Drug use: Never   Sexual activity: Yes  Other Topics Concern   Not on file  Social History Narrative   Lives with her husband.    Social Drivers of Health   Financial Resource Strain: Medium Risk (06/06/2023)   Overall Financial Resource Strain (CARDIA)    Difficulty of Paying Living Expenses: Somewhat hard  Food Insecurity: Food Insecurity Present (06/06/2023)   Hunger Vital Sign    Worried About Running Out of Food in the Last Year: Sometimes true    Ran Out of Food in the Last Year: Sometimes true  Transportation Needs: No Transportation Needs (06/06/2023)   PRAPARE - Administrator, Civil Service (Medical): No    Lack of Transportation (Non-Medical): No  Physical Activity: Insufficiently Active (06/06/2023)   Exercise Vital Sign    Days of Exercise per Week: 2 days    Minutes of Exercise per Session: 20 min  Stress: Stress Concern Present (06/06/2023)   [employer] of Occupational Health - Occupational Stress Questionnaire    Feeling of Stress : Rather much   Social Connections: Unknown (06/06/2023)   Social Connection and Isolation Panel [NHANES]    Frequency of Communication with Friends and Family: Twice a week    Frequency of Social Gatherings with Friends and Family: Once a week    Attends Religious Services: Patient declined    Database administrator or Organizations: No    Attends Banker Meetings: Not on file    Marital Status: Married  Intimate Partner Violence: Not At Risk (03/04/2022)   Received from [facility]   Humiliation, Afraid, Rape, and Kick questionnaire    Fear of Current or Ex-Partner: No    Emotionally Abused: No    Physically Abused: No    Sexually Abused: No    Family History: Family History  Problem Relation Age of Onset   Leukemia Mother    Breast cancer Sister    Diabetes Maternal Uncle    Hyperlipidemia Maternal Uncle    Diabetes Mellitus II Maternal Grandmother    Heart disease Maternal Grandfather    Diabetes Maternal Grandfather    Breast cancer Cousin    Breast cancer Half-Sister    Osteoporosis Half-Sister    Lupus Half-Sister    Lupus Half-Sister    Osteoporosis Half-Sister    Cancer - Lung Neg Hx    Colon cancer Neg Hx     Current Medications:  Current Outpatient Medications:    amLODipine  (NORVASC ) 5 MG tablet, TAKE 1 TABLET(5 MG) BY MOUTH DAILY, Disp: 90 tablet, Rfl: 2   anastrozole  (ARIMIDEX ) 1 MG tablet, Take 1 tablet (1 mg total) by mouth daily., Disp: 30 tablet, Rfl: 6   Ascorbic Acid (VITAMIN C) 1000 MG tablet, Take 1,000 mg by mouth daily., Disp: , Rfl:    aspirin  81 MG EC tablet, Take 81 mg by mouth daily., Disp: , Rfl:    betamethasone  dipropionate 0.05 % cream, Apply topically 2 (two) times daily., Disp: 30 g, Rfl: 1   butalbital -acetaminophen -caffeine  (FIORICET) 50-325-40 MG tablet, Take 1 tablet by mouth every 4 (four) hours as needed for headache., Disp: 30 tablet, Rfl: 0   Calcium-Magnesium-Vitamin D  (CALCIUM  1200+D3 PO), Take 1 tablet by mouth daily., Disp: , Rfl:    cetirizine-pseudoephedrine (ZYRTEC-D) 5-120 MG tablet, Take 1 tablet by mouth daily., Disp: , Rfl:    CINNAMON PO, Take 1,000 mg by mouth daily., Disp: ,  Rfl:    cyanocobalamin  1000 MCG tablet, Take 1 tablet (1,000 mcg total) by mouth daily. (Patient taking differently: Take 1,000 mcg by mouth every other day.), Disp: 30 tablet, Rfl: 6   cyclobenzaprine  (FLEXERIL ) 10 MG tablet, Take 1 tablet (10 mg total) by mouth 2 (two) times daily as needed for muscle spasms., Disp: 60 tablet, Rfl: 5   denosumab  (XGEVA ) 120 MG/1.7ML SOLN injection, Inject 120 mg into the skin every 30 (thirty) days., Disp: , Rfl:    dexamethasone  (DECADRON ) 0.5 MG/5ML solution, Take 0.5 mg by mouth daily., Disp: , Rfl:    dexamethasone  0.5 mg/5 mL - diphenhydrAMINE  12.5 mg/5 mL oral solution 2:1 mixture, Take 10 mLs by mouth 2 (two) times daily., Disp: 480 mL, Rfl: 2   furosemide  (LASIX ) 20 MG tablet, Take 1 tablet (20 mg total) by mouth daily as needed for edema (Take daily as needed for edema)., Disp: 30 tablet, Rfl: 0   HYDROcodone -acetaminophen  (NORCO) 5-325 MG tablet, Take 1 tablet by mouth every 12 (twelve) hours as needed for moderate pain., Disp: 30 tablet, Rfl: 0   lansoprazole  (PREVACID ) 30 MG capsule, TAKE 1 CAPSULE BY MOUTH IN THE MORNING AND AT BEDTIME, Disp: 120 capsule, Rfl: 3   Misc. Devices MISC, Please provide patient with left arm compression sleeve. Diagnosis metastatic breast cancer, bilateral mastectomy, left arm lymphedema, Disp: 1 each, Rfl: 0   NAPHCON-A 0.025-0.3 % ophthalmic solution, Place 2 drops into both eyes in the morning and at bedtime., Disp: , Rfl:    predniSONE  (DELTASONE ) 50 MG tablet, Take 1 tablet by mouth for 5 days, Disp: 5 tablet, Rfl: 0   pregabalin  (LYRICA ) 25 MG capsule, Take 1 capsule (25 mg total) by mouth 2 (two) times daily., Disp: 60 capsule, Rfl: 2   ribociclib  succ (KISQALI , 600 MG DOSE,) 200 MG Therapy Pack, Take 3  tablets (600 mg total) by mouth daily. Take for 21 days on, 7 days off, repeat every 28 days., Disp: 63 tablet, Rfl: 3   Allergies: Allergies  Allergen Reactions   Augmentin [Amoxicillin-Pot Clavulanate] Anaphylaxis   Ciprofloxacin Anaphylaxis   Claritin [Loratadine] Anaphylaxis   Gabapentin Anaphylaxis   Keflex [Cephalexin] Anaphylaxis   Levofloxacin Anaphylaxis   Lisinopril Swelling   Lisinopril Anaphylaxis   Metoprolol Swelling   Metoprolol Anaphylaxis   Nexium [Esomeprazole] Anaphylaxis   Sulfa Antibiotics Hives and Swelling   Tetracyclines & Related Anaphylaxis   Iohexol  Hives    Needs pre meds   Ultram  [Tramadol ] Other (See Comments)    Patient states medication irritates throat    Ceftin [Cefuroxime Axetil] Hives   Chromium Other (See Comments)    headache   Iodinated Contrast Media Hives   Misc. Sulfonamide Containing Compounds Other (See Comments)   Oxycodone Itching   Sulfa Antibiotics     Possible Reaction    Latex Rash    itching   Nickel Rash    itching    REVIEW OF SYSTEMS:   Review of Systems  Constitutional:  Negative for chills, fatigue and fever.  HENT:   Negative for lump/mass, mouth sores, nosebleeds, sore throat and trouble swallowing.   Eyes:  Negative for eye problems.  Respiratory:  Positive for shortness of breath. Negative for cough.   Cardiovascular:  Negative for chest pain, leg swelling and palpitations.  Gastrointestinal:  Positive for constipation. Negative for abdominal pain, diarrhea, nausea and vomiting.  Genitourinary:  Negative for bladder incontinence, difficulty urinating, dysuria, frequency, hematuria and nocturia.   Musculoskeletal:  Negative for  arthralgias, back pain, flank pain, myalgias and neck pain.  Skin:  Negative for itching and rash.  Neurological:  Positive for headaches and numbness. Negative for dizziness.  Hematological:  Does not bruise/bleed easily.  Psychiatric/Behavioral:  Positive for sleep disturbance.  Negative for depression and suicidal ideas. The patient is not nervous/anxious.   All other systems reviewed and are negative.    VITALS:   Last menstrual period 06/08/2012.  Wt Readings from Last 3 Encounters:  12/17/23 183 lb (83 kg)  11/20/23 186 lb 15.2 oz (84.8 kg)  11/05/23 186 lb (84.4 kg)    There is no height or weight on file to calculate BMI.  Performance status (ECOG): 1 - Symptomatic but completely ambulatory  PHYSICAL EXAM:   Physical Exam Vitals and nursing note reviewed. Exam conducted with a chaperone present.  Constitutional:      Appearance: Normal appearance.  Cardiovascular:     Rate and Rhythm: Normal rate and regular rhythm.     Pulses: Normal pulses.     Heart sounds: Normal heart sounds.  Pulmonary:     Effort: Pulmonary effort is normal.     Breath sounds: Normal breath sounds.  Abdominal:     Palpations: Abdomen is soft. There is no hepatomegaly, splenomegaly or mass.     Tenderness: There is no abdominal tenderness.  Musculoskeletal:     Right lower leg: No edema.     Left lower leg: No edema.  Lymphadenopathy:     Cervical: No cervical adenopathy.     Right cervical: No superficial, deep or posterior cervical adenopathy.    Left cervical: No superficial, deep or posterior cervical adenopathy.     Upper Body:     Right upper body: No supraclavicular or axillary adenopathy.     Left upper body: No supraclavicular or axillary adenopathy.  Neurological:     General: No focal deficit present.     Mental Status: She is alert and oriented to person, place, and time.  Psychiatric:        Mood and Affect: Mood normal.        Behavior: Behavior normal.    LABS:      Latest Ref Rng & Units 01/14/2024   10:46 AM 12/03/2023    1:53 PM 10/29/2023   11:00 AM  CBC  WBC 4.0 - 10.5 K/uL 5.1  5.5  5.0   Hemoglobin 12.0 - 15.0 g/dL 45.4  09.8  11.9   Hematocrit 36.0 - 46.0 % 45.4  43.1  43.5   Platelets 150 - 400 K/uL 315  301  311       Latest  Ref Rng & Units 12/03/2023    1:53 PM 10/29/2023   11:00 AM 10/08/2023   12:19 PM  CMP  Glucose 70 - 99 mg/dL 98  147  88   BUN 8 - 23 mg/dL 15  18  18    Creatinine 0.44 - 1.00 mg/dL 8.29  5.62  1.30   Sodium 135 - 145 mmol/L 138  140  138   Potassium 3.5 - 5.1 mmol/L 3.7  3.3  4.3   Chloride 98 - 111 mmol/L 106  105  104   CO2 22 - 32 mmol/L 21  23  23    Calcium 8.9 - 10.3 mg/dL 9.9  9.6  9.7   Total Protein 6.5 - 8.1 g/dL 7.6  7.2  7.5   Total Bilirubin <1.2 mg/dL 0.5  0.7  0.8   Alkaline Phos 38 - 126  U/L 59  60  65   AST 15 - 41 U/L 42  27  29   ALT 0 - 44 U/L 46  37  32      No results found for: "CEA1", "CEA" / No results found for: "CEA1", "CEA" No results found for: "PSA1" No results found for: "ZOX096" No results found for: "CAN125"  No results found for: "TOTALPROTELP", "ALBUMINELP", "A1GS", "A2GS", "BETS", "BETA2SER", "GAMS", "MSPIKE", "SPEI" No results found for: "TIBC", "FERRITIN", "IRONPCTSAT" No results found for: "LDH"   STUDIES:   No results found.

## 2024-01-15 ENCOUNTER — Encounter (INDEPENDENT_AMBULATORY_CARE_PROVIDER_SITE_OTHER): Payer: Self-pay | Admitting: Gastroenterology

## 2024-01-15 ENCOUNTER — Ambulatory Visit (INDEPENDENT_AMBULATORY_CARE_PROVIDER_SITE_OTHER): Payer: Medicaid Other | Admitting: Gastroenterology

## 2024-01-15 VITALS — BP 106/78 | HR 91 | Temp 97.8°F | Ht 64.5 in | Wt 182.4 lb

## 2024-01-15 DIAGNOSIS — K5900 Constipation, unspecified: Secondary | ICD-10-CM | POA: Insufficient documentation

## 2024-01-15 DIAGNOSIS — R1310 Dysphagia, unspecified: Secondary | ICD-10-CM

## 2024-01-15 DIAGNOSIS — K5909 Other constipation: Secondary | ICD-10-CM | POA: Diagnosis not present

## 2024-01-15 DIAGNOSIS — K219 Gastro-esophageal reflux disease without esophagitis: Secondary | ICD-10-CM | POA: Diagnosis not present

## 2024-01-15 LAB — CANCER ANTIGEN 27.29: CA 27.29: 46.1 U/mL — ABNORMAL HIGH (ref 0.0–38.6)

## 2024-01-15 LAB — CANCER ANTIGEN 15-3: CA 15-3: 29.5 U/mL — ABNORMAL HIGH (ref 0.0–25.0)

## 2024-01-15 NOTE — Patient Instructions (Signed)
-  continue with prevacid 30mg , try to take this twice daily -let me know if you wish to schedule upper endoscopy for your swallowing  -continue with chewing precautions, chewing thoroughly with small bites, sips of liquids between bites  -start benefiber for constipation, I have provided a handout on this -Increase water intake, aim for atleast 64 oz per day Increase fruits, veggies and whole grains, kiwi and prunes are especially good for constipation -please discuss veins around mastectomy scar with PCP  Follow up 6 months  It was a pleasure to see you today. I want to create trusting relationships with patients and provide genuine, compassionate, and quality care. I truly value your feedback! please be on the lookout for a survey regarding your visit with me today. I appreciate your input about our visit and your time in completing this!    Jamelle Noy L. Jeanmarie Hubert, MSN, APRN, AGNP-C Adult-Gerontology Nurse Practitioner [facility] at [facility]

## 2024-01-15 NOTE — Progress Notes (Addendum)
Referring Provider: Billie Lade, MD Primary Care Physician:  Billie Lade, MD Primary GI Physician: Dr. Levon Hedger   Chief Complaint  Patient presents with   Follow-up    Patient here today for a follow up. Patient says she is still having the occasional issues with dysphagia. She is trying to be careful  of what she is eating. She is also needing to go over the testing the Endoflip she had at [facility].   HPI:   Wendy Zamora is a 63 y.o. female with past medical history of metastatic breast cancer s/p bilateral mastectomy, radiation therapy and currently on chemotherapy on low dose Ibrance and Faslodex   Patient presenting today for follow up of dysphagia, GERD and with constipation   Last seen July 2024, at that time patient reported ongoing dysphagia though worse for the past 5 to 6 months.  Also having a dry cough.  Feel that she has a lump on her chest with her having double mastectomy.  Having heartburn almost daily.  Taking lansoprazole.  Patient recommended to increase lansoprazole back to twice daily dosing, refer back to [facility] for EGD with Endoflip, chewing precautions, reflux precautions.  Results of Endoflip done in November were discussed by Dr. Levon Hedger with the patient who reported resolution of her dysphagia at that time, recommended holding off on any further endoscopies unless patient had recurrent dysphagia.  Present: She states that dysphagia has recurred over the past few months. Has more issues where she feels that both liquids/foods can pass down slower. She has to take smaller bites and sometimes stretch her neck up which helps her to be able to pass foods down.   She is still having heartburn fairly frequently, she is taking prevacid only BID a few times per week. Does note improvement in symptoms when she takes this BID. She has not tolerated nexium in the past, feels that prevacid is the only one that really works for her. She has not tried famotidine  with her prevacid to see if this helps.   Having some issues with constipation, noting sometimes eating honey nut cheerios will help her go. She drinks plenty of water. Sometimes has to strain as she feels stools are hard. Tried metamucil and miralax in the past without good results. No rectal bleeding, melena, abdominal pain.   Patient also reports some vascular like skin changes to her Left mastectomy scar that has been present for the past few months.   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  modified barium swallow that showed decreased muscle contraction but there was no official report about this.  Last EGD: 01/22/22 - No endoscopic esophageal abnormality to explain patient's dysphagia. Dilated. Biopsied. - 2 cm hiatal hernia. - Erosive gastropathy with no stigmata of recent bleeding. Biopsied. - Normal examined duodenum. Last Colonoscopy: 11/03/2020 cecal and rectal polyp between 3 mm to 1 cm in size removed with a hot snare. No pathology available   Path A. STOMACH, BIOPSY:  - Gastric antral mucosa with nonspecific reactive gastropathy  - Gastric oxyntic mucosa with parietal cell hyperplasia as can be seen  in hypergastrinemic states such as PPI therapy.  - Helicobacter pylori-like organisms are not identified on routine HE  stain  B. ESOPHAGUS, DISTAL, BIOPSY:  - Esophageal squamous mucosa with  no specific histopathologic changes  - Negative for increased intraepithelial eosinophils C. ESOPHAGUS, MID, BIOPSY:  - Esophageal squamous mucosa with no specific histopathologic changes  - Negative for increased intraepithelial eosinophils    Past Medical History:  Diagnosis Date   Allergy    Asthma    Asthma    diagnosed age 5   Bone  metastasis 08/2020   was on prolia   Breast cancer (HCC)    started chemo 05/05/2019. finished 08/2019   Gangrene (HCC)    GERD (gastroesophageal reflux disease)    Hx of bilateral mastectomy 10/22/2019   Hypertension    Hypertension    started bp meds 2008   Lymphedema of arm    left arm   Osteopenia    Shingles 04/20/2020   left side    Past Surgical History:  Procedure Laterality Date   BIOPSY  01/22/2022   Procedure: BIOPSY;  Surgeon: Marguerita Merles, Reuel Boom, MD;  Location: [facility];  Service: Gastroenterology;;   Fidela Salisbury RELEASE Bilateral 2006   ESOPHAGOGASTRODUODENOSCOPY (EGD) WITH PROPOFOL N/A 01/22/2022   Procedure: ESOPHAGOGASTRODUODENOSCOPY (EGD) WITH PROPOFOL;  Surgeon: Dolores Frame, MD;  Location: [facility];  Service: Gastroenterology;  Laterality: N/A;  205   hysteroscopy     age 50   mastectomy Bilateral 10/22/2019   pt had necrosis after 1st surgery, had another surgery to remove implants 12/10/2019   MOUTH SURGERY     TUBAL LIGATION     TUBAL LIGATION Bilateral 1998   WISDOM TOOTH EXTRACTION     early 30's    Current Outpatient Medications  Medication Sig Dispense Refill   amLODipine (NORVASC) 5 MG tablet TAKE 1 TABLET(5 MG) BY MOUTH DAILY 90 tablet 2   Ascorbic Acid (VITAMIN C) 1000 MG tablet Take 1,000 mg by mouth daily.     aspirin 81 MG EC tablet Take 81 mg by mouth daily.     betamethasone dipropionate 0.05 % cream Apply topically 2 (two) times daily. 30 g 1   butalbital-acetaminophen-caffeine (FIORICET) 50-325-40 MG tablet Take 1 tablet by mouth every 4 (four) hours as needed for headache. 30 tablet 0   Calcium-Magnesium-Vitamin D (CALCIUM 1200+D3 PO) Take 1 tablet by mouth daily.     cetirizine-pseudoephedrine (ZYRTEC-D) 5-120 MG tablet Take 1 tablet by mouth daily.     CINNAMON PO Take 1,000 mg by mouth daily.     cyanocobalamin 1000 MCG tablet Take 1 tablet (1,000 mcg total) by mouth daily. (Patient taking differently:  Take 1,000 mcg by mouth every other day.) 30 tablet 6   cyclobenzaprine (FLEXERIL) 10 MG tablet Take 1 tablet (10 mg total) by mouth 2 (two) times daily as needed for muscle spasms. 60 tablet 5   denosumab (XGEVA) 120 MG/1.7ML SOLN injection Inject 120 mg into the skin every 30 (thirty) days.     dexamethasone (DECADRON) 0.5 MG/5ML solution Take 0.5 mg by mouth daily.     dexamethasone 0.5 mg/5 mL - diphenhydrAMINE 12.5 mg/5 mL oral solution 2:1 mixture Take 10 mLs by mouth 2 (two) times daily. 480 mL 2   furosemide (LASIX) 20 MG tablet Take 1 tablet (20 mg total) by mouth daily as needed for edema (Take daily as needed for edema). 30 tablet 0   HYDROcodone-acetaminophen (NORCO) 5-325 MG tablet Take 1 tablet by mouth every 12 (twelve) hours as needed for moderate pain. 30 tablet 0   lansoprazole (PREVACID) 30 MG capsule TAKE 1 CAPSULE BY MOUTH IN THE MORNING AND AT BEDTIME  120 capsule 3   letrozole (FEMARA) 2.5 MG tablet Take 1 tablet (2.5 mg total) by mouth daily. 30 tablet 1   Misc. Devices MISC Please provide patient with left arm compression sleeve. Diagnosis metastatic breast cancer, bilateral mastectomy, left arm lymphedema 1 each 0   NAPHCON-A 0.025-0.3 % ophthalmic solution Place 2 drops into both eyes in the morning and at bedtime.     predniSONE (DELTASONE) 50 MG tablet Take 1 tablet by mouth for 5 days 5 tablet 0   pregabalin (LYRICA) 25 MG capsule Take 1 capsule (25 mg total) by mouth 2 (two) times daily. 60 capsule 2   ribociclib succ (KISQALI, 600 MG DOSE,) 200 MG Therapy Pack Take 3 tablets (600 mg total) by mouth daily. Take for 21 days on, 7 days off, repeat every 28 days. (Patient not taking: Reported on 01/14/2024) 63 tablet 3   No current facility-administered medications for this visit.    Allergies as of 01/15/2024 - Review Complete 01/15/2024  Allergen Reaction Noted   Augmentin [amoxicillin-pot clavulanate] Anaphylaxis 07/03/2021   Ciprofloxacin Anaphylaxis 07/03/2021    Claritin [loratadine] Anaphylaxis 07/03/2021   Gabapentin Anaphylaxis 07/03/2021   Keflex [cephalexin] Anaphylaxis 07/03/2021   Levofloxacin Anaphylaxis 07/03/2021   Lisinopril Swelling 06/08/2012   Lisinopril Anaphylaxis 07/03/2021   Metoprolol Swelling 06/08/2012   Metoprolol Anaphylaxis 07/03/2021   Nexium [esomeprazole] Anaphylaxis 07/03/2021   Sulfa antibiotics Hives and Swelling 06/08/2012   Tetracyclines & related Anaphylaxis 07/03/2021   Iohexol Hives 09/16/2022   Ultram [tramadol] Other (See Comments) 03/18/2023   Anastrozole Hives 01/14/2024   Ceftin [cefuroxime axetil] Hives 06/08/2012   Chromium Other (See Comments) 07/03/2021   Exemestane Hives 01/14/2024   Iodinated contrast media Hives 03/04/2023   Kisqali (200 mg dose) [ribociclib succ (200 mg dose)] Hives 01/14/2024   Misc. sulfonamide containing compounds Other (See Comments) 10/22/2022   Oxycodone Itching 07/03/2021   Sulfa antibiotics  04/03/2015   Latex Rash 07/03/2021   Nickel Rash 07/03/2021    Family History  Problem Relation Age of Onset   Leukemia Mother    Breast cancer Sister    Diabetes Maternal Uncle    Hyperlipidemia Maternal Uncle    Diabetes Mellitus II Maternal Grandmother    Heart disease Maternal Grandfather    Diabetes Maternal Grandfather    Breast cancer Cousin    Breast cancer Half-Sister    Osteoporosis Half-Sister    Lupus Half-Sister    Lupus Half-Sister    Osteoporosis Half-Sister    Cancer - Lung Neg Hx    Colon cancer Neg Hx     Social History   Socioeconomic History   Marital status: Married    Spouse name: Not on file   Number of children: 2   Years of education: Not on file   Highest education level: GED or equivalent  Occupational History   Occupation: Disability  Tobacco Use   Smoking status: Every Day    Current packs/day: 0.50    Average packs/day: 0.5 packs/day for 40.0 years (20.0 ttl pk-yrs)    Types: Cigarettes   Smokeless tobacco: Never   Tobacco  comments:    Smokes half a pack a day for 40 years.   Vaping Use   Vaping status: Never Used  Substance and Sexual Activity   Alcohol use: Never   Drug use: Never   Sexual activity: Yes  Other Topics Concern   Not on file  Social History Narrative   Lives with her husband.    Social Drivers  of Health   Financial Resource Strain: Medium Risk (06/06/2023)   Overall Financial Resource Strain (CARDIA)    Difficulty of Paying Living Expenses: Somewhat hard  Food Insecurity: Food Insecurity Present (06/06/2023)   Hunger Vital Sign    Worried About Running Out of Food in the Last Year: Sometimes true    Ran Out of Food in the Last Year: Sometimes true  Transportation Needs: No Transportation Needs (06/06/2023)   PRAPARE - Administrator, Civil Service (Medical): No    Lack of Transportation (Non-Medical): No  Physical Activity: Insufficiently Active (06/06/2023)   Exercise Vital Sign    Days of Exercise per Week: 2 days    Minutes of Exercise per Session: 20 min  Stress: Stress Concern Present (06/06/2023)   [employer] of Occupational Health - Occupational Stress Questionnaire    Feeling of Stress : Rather much  Social Connections: Unknown (06/06/2023)   Social Connection and Isolation Panel [NHANES]    Frequency of Communication with Friends and Family: Twice a week    Frequency of Social Gatherings with Friends and Family: Once a week    Attends Religious Services: Patient declined    Database administrator or Organizations: No    Attends Engineer, structural: Not on file    Marital Status: Married    Review of systems General: negative for malaise, night sweats, fever, chills, weight loss Neck: Negative for lumps, goiter, pain and significant neck swelling Resp: Negative for cough, wheezing, dyspnea at rest CV: Negative for chest pain, leg swelling, palpitations, orthopnea GI: denies melena, hematochezia, nausea, vomiting, diarrhea, odynophagia, early  satiety or unintentional weight loss. +dysphagia +constipation  Neuro: negative for tremor, gait imbalance, syncope and seizures. The remainder of the review of systems is noncontributory.  Physical Exam: BP 106/78 (BP Location: Right Arm, Patient Position: Sitting, Cuff Size: Large)   Pulse 91   Temp 97.8 F (36.6 C) (Temporal)   Ht 5' 4.5" (1.638 m)   Wt 182 lb 6.4 oz (82.7 kg)   LMP 06/08/2012   BMI 30.83 kg/m  General:   Alert and oriented. No distress noted. Pleasant and cooperative.  Head:  Normocephalic and atraumatic. Eyes:  Conjuctiva clear without scleral icterus. Mouth:  Oral mucosa pink and moist. Good dentition. No lesions. Heart: Normal rate and rhythm, s1 and s2 heart sounds present.  Lungs: Clear lung sounds in all lobes. Respirations equal and unlabored. Abdomen:  +BS, soft, non-tender and non-distended. No rebound or guarding. No HSM or masses noted. Derm: prominent vasculature with red/purple veins present around Left mastectomy site Neurologic:  Alert and  oriented x4 Psych:  Alert and cooperative. Normal mood and affect.  Invalid input(s): "6 MONTHS"   ASSESSMENT: Wendy Zamora is a 63 y.o. female presenting today for follow up of GERD, dysphagia, also with constipation   GERD: Still having some breakthrough symptoms multiple times per week though she does report she is not compliant with taking her PPI twice daily as prescribed.  She has declined to add addition of H2 blocker in the past and upon discussion of this today she still does not want to take famotidine.  She notably reports she has failed multiple other PPIs and does not wish to change PPI therapy at this time. I did encourage her to try to diligently take her Prevacid 30 mg twice daily, however if she continues to have breakthrough symptoms will need to discuss adding famotidine.    Dysphagia: Endoflip in November  as outlined above with EGJOO.  Patient denied dysphagia with Dr. Levon Hedger spoke with  her regarding findings of Endoflip, she reports she has had progressive dysphagia over the past few months noting she has issues almost daily with liquids and food going down.  Recommend proceeding with upper endoscopy for further evaluation at this time though she has some other things healthwise going on and would like to call let me know when she is ready to schedule this.  Recommend she continue with good chewing precautions.  Constipation: She reports chronic constipation since doing chemotherapy a few years back.  Recent CMP and TSH were within normal limits.  No rectal bleeding, melena, weight loss, abdominal pain.  Sometimes can eat high-fiber Cheerios which helps her to move her bowels.  Patient reports she has failed Metamucil and MiraLAX in the past.  Recommend she start Benefiber, start with 1 tablespoon daily with a meal, can increase up to 2 tablespoons daily with a meal if tolerating after 1 week.  She continue with good water intake, diet high in fruits, veggies, whole grains.  Patient also reports some prominent vasculature around her left mastectomy scar.  Unclear if this is related to previous radiation/revascularization of scar tissue present though I did recommend she discuss this with her PCP/oncologist.  PLAN:  -continue with prevacid 30mg , try to take this BID -consider famotidine if taking diligently BID and still having breakthrough  -schedule EGD-pt to let me know when she wants to schedule -continue with chewing precautions.  -Start Benefiber, handout provided -Increase water intake, aim for atleast 64 oz per day Increase fruits, veggies and whole grains, kiwi and prunes are especially good for constipation   All questions were answered, patient verbalized understanding and is in agreement with plan as outlined above.   Follow Up: 6 months  Damarien Nyman L. Jeanmarie Hubert, MSN, APRN, AGNP-C Adult-Gerontology Nurse Practitioner [facility]  I have reviewed  the note and agree with the APP's assessment as described in this progress note   Will discuss repeat colonoscopy in follow up appointment given history of colon polyps.  Katrinka Blazing, MD Gastroenterology and Hepatology [facility]

## 2024-01-16 ENCOUNTER — Other Ambulatory Visit: Payer: Self-pay

## 2024-01-20 ENCOUNTER — Telehealth: Payer: Self-pay | Admitting: *Deleted

## 2024-01-20 ENCOUNTER — Telehealth: Payer: Self-pay | Admitting: Genetic Counselor

## 2024-01-20 ENCOUNTER — Encounter: Payer: Self-pay | Admitting: Genetic Counselor

## 2024-01-20 DIAGNOSIS — Z1379 Encounter for other screening for genetic and chromosomal anomalies: Secondary | ICD-10-CM | POA: Insufficient documentation

## 2024-01-20 NOTE — Telephone Encounter (Signed)
Within 30 minutes of her 1st Letrozole, she got a severe hot flash followed by burning, stinging in her legs extending up her body and included throat. She only took the 1 dose and treated herself with prednisone and benadryl.  Per Dr. Ellin Saba, she had tolerated in the past, therefore he recommends taking her prednisone and benadryl 30 minutes prior to taking and report findings.   If it is still not tolerated, will make an appointment to discuss further options. Patient aware and verbalized understanding.

## 2024-01-20 NOTE — Telephone Encounter (Signed)
Called [facility] to obtain copy of previous genetic test results from 04/28/2019.  A copy of the Common Hereditary Cancer panel was faxed over.  I uploaded a copy of the results to the patient's file, documented the result in the problem list and emailed a copy of the result to [facility] providers.

## 2024-02-06 ENCOUNTER — Inpatient Hospital Stay: Payer: Medicaid Other | Attending: Hematology

## 2024-02-06 DIAGNOSIS — Z1721 Progesterone receptor positive status: Secondary | ICD-10-CM | POA: Diagnosis not present

## 2024-02-06 DIAGNOSIS — Z9013 Acquired absence of bilateral breasts and nipples: Secondary | ICD-10-CM | POA: Diagnosis not present

## 2024-02-06 DIAGNOSIS — C50919 Malignant neoplasm of unspecified site of unspecified female breast: Secondary | ICD-10-CM

## 2024-02-06 DIAGNOSIS — C7951 Secondary malignant neoplasm of bone: Secondary | ICD-10-CM | POA: Diagnosis not present

## 2024-02-06 DIAGNOSIS — M8580 Other specified disorders of bone density and structure, unspecified site: Secondary | ICD-10-CM | POA: Insufficient documentation

## 2024-02-06 DIAGNOSIS — C50912 Malignant neoplasm of unspecified site of left female breast: Secondary | ICD-10-CM | POA: Diagnosis not present

## 2024-02-06 DIAGNOSIS — Z7982 Long term (current) use of aspirin: Secondary | ICD-10-CM | POA: Insufficient documentation

## 2024-02-06 DIAGNOSIS — Z170 Estrogen receptor positive status [ER+]: Secondary | ICD-10-CM | POA: Diagnosis not present

## 2024-02-06 DIAGNOSIS — Z1732 Human epidermal growth factor receptor 2 negative status: Secondary | ICD-10-CM | POA: Insufficient documentation

## 2024-02-06 DIAGNOSIS — Z79899 Other long term (current) drug therapy: Secondary | ICD-10-CM | POA: Diagnosis not present

## 2024-02-06 DIAGNOSIS — Z79811 Long term (current) use of aromatase inhibitors: Secondary | ICD-10-CM | POA: Insufficient documentation

## 2024-02-06 LAB — CBC WITH DIFFERENTIAL/PLATELET
Abs Immature Granulocytes: 0.02 10*3/uL (ref 0.00–0.07)
Basophils Absolute: 0.1 10*3/uL (ref 0.0–0.1)
Basophils Relative: 1 %
Eosinophils Absolute: 0.1 10*3/uL (ref 0.0–0.5)
Eosinophils Relative: 1 %
HCT: 45.4 % (ref 36.0–46.0)
Hemoglobin: 14.8 g/dL (ref 12.0–15.0)
Immature Granulocytes: 0 %
Lymphocytes Relative: 29 %
Lymphs Abs: 1.5 10*3/uL (ref 0.7–4.0)
MCH: 30.6 pg (ref 26.0–34.0)
MCHC: 32.6 g/dL (ref 30.0–36.0)
MCV: 93.8 fL (ref 80.0–100.0)
Monocytes Absolute: 0.5 10*3/uL (ref 0.1–1.0)
Monocytes Relative: 10 %
Neutro Abs: 3 10*3/uL (ref 1.7–7.7)
Neutrophils Relative %: 59 %
Platelets: 320 10*3/uL (ref 150–400)
RBC: 4.84 MIL/uL (ref 3.87–5.11)
RDW: 12.5 % (ref 11.5–15.5)
WBC: 5.2 10*3/uL (ref 4.0–10.5)
nRBC: 0 % (ref 0.0–0.2)

## 2024-02-06 LAB — COMPREHENSIVE METABOLIC PANEL
ALT: 29 U/L (ref 0–44)
AST: 30 U/L (ref 15–41)
Albumin: 4 g/dL (ref 3.5–5.0)
Alkaline Phosphatase: 55 U/L (ref 38–126)
Anion gap: 12 (ref 5–15)
BUN: 11 mg/dL (ref 8–23)
CO2: 24 mmol/L (ref 22–32)
Calcium: 10 mg/dL (ref 8.9–10.3)
Chloride: 104 mmol/L (ref 98–111)
Creatinine, Ser: 0.84 mg/dL (ref 0.44–1.00)
GFR, Estimated: >60 mL/min (ref >60)
Glucose, Bld: 97 mg/dL (ref 70–99)
Potassium: 4 mmol/L (ref 3.5–5.1)
Sodium: 140 mmol/L (ref 135–145)
Total Bilirubin: 0.8 mg/dL (ref 0.0–1.2)
Total Protein: 7.5 g/dL (ref 6.5–8.1)

## 2024-02-06 LAB — MAGNESIUM: Magnesium: 2.1 mg/dL (ref 1.7–2.4)

## 2024-02-07 ENCOUNTER — Other Ambulatory Visit: Payer: Self-pay | Admitting: Internal Medicine

## 2024-02-07 LAB — CANCER ANTIGEN 15-3: CA 15-3: 31.2 U/mL — ABNORMAL HIGH (ref 0.0–25.0)

## 2024-02-09 ENCOUNTER — Other Ambulatory Visit: Payer: Self-pay | Admitting: *Deleted

## 2024-02-09 DIAGNOSIS — C50919 Malignant neoplasm of unspecified site of unspecified female breast: Secondary | ICD-10-CM

## 2024-02-11 ENCOUNTER — Inpatient Hospital Stay: Payer: Medicaid Other

## 2024-02-11 ENCOUNTER — Inpatient Hospital Stay: Payer: Medicaid Other | Admitting: Hematology

## 2024-02-11 VITALS — BP 132/76 | HR 85 | Temp 98.1°F | Resp 18

## 2024-02-11 DIAGNOSIS — C50912 Malignant neoplasm of unspecified site of left female breast: Secondary | ICD-10-CM | POA: Diagnosis not present

## 2024-02-11 DIAGNOSIS — Z1721 Progesterone receptor positive status: Secondary | ICD-10-CM | POA: Diagnosis not present

## 2024-02-11 DIAGNOSIS — C50919 Malignant neoplasm of unspecified site of unspecified female breast: Secondary | ICD-10-CM

## 2024-02-11 DIAGNOSIS — M8580 Other specified disorders of bone density and structure, unspecified site: Secondary | ICD-10-CM | POA: Diagnosis not present

## 2024-02-11 DIAGNOSIS — M810 Age-related osteoporosis without current pathological fracture: Secondary | ICD-10-CM

## 2024-02-11 DIAGNOSIS — Z7982 Long term (current) use of aspirin: Secondary | ICD-10-CM | POA: Diagnosis not present

## 2024-02-11 DIAGNOSIS — Z170 Estrogen receptor positive status [ER+]: Secondary | ICD-10-CM | POA: Diagnosis not present

## 2024-02-11 DIAGNOSIS — Z9013 Acquired absence of bilateral breasts and nipples: Secondary | ICD-10-CM | POA: Diagnosis not present

## 2024-02-11 DIAGNOSIS — Z79811 Long term (current) use of aromatase inhibitors: Secondary | ICD-10-CM | POA: Diagnosis not present

## 2024-02-11 DIAGNOSIS — Z79899 Other long term (current) drug therapy: Secondary | ICD-10-CM | POA: Diagnosis not present

## 2024-02-11 DIAGNOSIS — C7951 Secondary malignant neoplasm of bone: Secondary | ICD-10-CM | POA: Diagnosis not present

## 2024-02-11 DIAGNOSIS — Z1732 Human epidermal growth factor receptor 2 negative status: Secondary | ICD-10-CM | POA: Diagnosis not present

## 2024-02-11 MED ORDER — DENOSUMAB 120 MG/1.7ML ~~LOC~~ SOLN
120.0000 mg | Freq: Once | SUBCUTANEOUS | Status: AC
  Administered 2024-02-11: 120 mg via SUBCUTANEOUS
  Filled 2024-02-11: qty 1.7

## 2024-02-11 NOTE — Patient Instructions (Signed)
[facility] [place] - A DEPT OF [facility]  Discharge Instructions: Thank you for choosing [facility] to provide your oncology and hematology care.  If you have a lab appointment with the Cancer Center - please note that after April 8th, 2024, all labs will be drawn in the cancer center.  You do not have to check in or register with the main entrance as you have in the past but will complete your check-in in the cancer center.  Wear comfortable clothing and clothing appropriate for easy access to any Portacath or PICC line.   We strive to give you quality time with your provider. You may need to reschedule your appointment if you arrive late (15 or more minutes).  Arriving late affects you and other patients whose appointments are after yours.  Also, if you miss three or more appointments without notifying the office, you may be dismissed from the clinic at the provider's discretion.      For prescription refill requests, have your pharmacy contact our office and allow 72 hours for refills to be completed.    Today you received the following:  Xgeva.  Denosumab Injection (Oncology) What is this medication? DENOSUMAB (den oh SUE mab) prevents weakened bones caused by cancer. It may also be used to treat noncancerous bone tumors that cannot be removed by surgery. It can also be used to treat high calcium levels in the blood caused by cancer. It works by blocking a protein that causes bones to break down quickly. This slows down the release of calcium from bones, which lowers calcium levels in your blood. It also makes your bones stronger and less likely to break (fracture). This medicine may be used for other purposes; ask your health care provider or pharmacist if you have questions. COMMON BRAND NAME(S): XGEVA What should I tell my care team before I take this medication? They need to know if you have any of these conditions: Dental disease Having surgery  or tooth extraction Infection Kidney disease Low levels of calcium or vitamin D in the blood Malnutrition On hemodialysis Skin conditions or sensitivity Thyroid or parathyroid disease An unusual reaction to denosumab, other medications, foods, dyes, or preservatives Pregnant or trying to get pregnant Breast-feeding How should I use this medication? This medication is for injection under the skin. It is given by your care team in a hospital or clinic setting. A special MedGuide will be given to you before each treatment. Be sure to read this information carefully each time. Talk to your care team about the use of this medication in children. While it may be prescribed for children as young as 13 years for selected conditions, precautions do apply. Overdosage: If you think you have taken too much of this medicine contact a poison control center or emergency room at once. NOTE: This medicine is only for you. Do not share this medicine with others. What if I miss a dose? Keep appointments for follow-up doses. It is important not to miss your dose. Call your care team if you are unable to keep an appointment. What may interact with this medication? Do not take this medication with any of the following: Other medications containing denosumab This medication may also interact with the following: Medications that lower your chance of fighting infection Steroid medications, such as prednisone or cortisone This list may not describe all possible interactions. Give your health care provider a list of all the medicines, herbs, non-prescription  drugs, or dietary supplements you use. Also tell them if you smoke, drink alcohol, or use illegal drugs. Some items may interact with your medicine. What should I watch for while using this medication? Your condition will be monitored carefully while you are receiving this medication. You may need blood work while taking this medication. This medication may  increase your risk of getting an infection. Call your care team for advice if you get a fever, chills, sore throat, or other symptoms of a cold or flu. Do not treat yourself. Try to avoid being around people who are sick. You should make sure you get enough calcium and vitamin D while you are taking this medication, unless your care team tells you not to. Discuss the foods you eat and the vitamins you take with your care team. Some people who take this medication have severe bone, joint, or muscle pain. This medication may also increase your risk for jaw problems or a broken thigh bone. Tell your care team right away if you have severe pain in your jaw, bones, joints, or muscles. Tell your care team if you have any pain that does not go away or that gets worse. Talk to your care team if you may be pregnant. Serious birth defects can occur if you take this medication during pregnancy and for 5 months after the last dose. You will need a negative pregnancy test before starting this medication. Contraception is recommended while taking this medication and for 5 months after the last dose. Your care team can help you find the option that works for you. What side effects may I notice from receiving this medication? Side effects that you should report to your care team as soon as possible: Allergic reactions--skin rash, itching, hives, swelling of the face, lips, tongue, or throat Bone, joint, or muscle pain Low calcium level--muscle pain or cramps, confusion, tingling, or numbness in the hands or feet Osteonecrosis of the jaw--pain, swelling, or redness in the mouth, numbness of the jaw, poor healing after dental work, unusual discharge from the mouth, visible bones in the mouth Side effects that usually do not require medical attention (report to your care team if they continue or are bothersome): Cough Diarrhea Fatigue Headache Nausea This list may not describe all possible side effects. Call your  doctor for medical advice about side effects. You may report side effects to FDA at 1-800-FDA-1088. Where should I keep my medication? This medication is given in a hospital or clinic. It will not be stored at home. NOTE: This sheet is a summary. It may not cover all possible information. If you have questions about this medicine, talk to your doctor, pharmacist, or health care provider.  2024 Elsevier/Gold Standard (2022-05-08 00:00:00)     To help prevent nausea and vomiting after your treatment, we encourage you to take your nausea medication as directed.  BELOW ARE SYMPTOMS THAT SHOULD BE REPORTED IMMEDIATELY: *FEVER GREATER THAN 100.4 F (38 C) OR HIGHER *CHILLS OR SWEATING *NAUSEA AND VOMITING THAT IS NOT CONTROLLED WITH YOUR NAUSEA MEDICATION *UNUSUAL SHORTNESS OF BREATH *UNUSUAL BRUISING OR BLEEDING *URINARY PROBLEMS (pain or burning when urinating, or frequent urination) *BOWEL PROBLEMS (unusual diarrhea, constipation, pain near the anus) TENDERNESS IN MOUTH AND THROAT WITH OR WITHOUT PRESENCE OF ULCERS (sore throat, sores in mouth, or a toothache) UNUSUAL RASH, SWELLING OR PAIN  UNUSUAL VAGINAL DISCHARGE OR ITCHING   Items with * indicate a potential emergency and should be followed up as soon as possible or  go to the Emergency Department if any problems should occur.  Please show the CHEMOTHERAPY ALERT CARD or IMMUNOTHERAPY ALERT CARD at check-in to the Emergency Department and triage nurse.  Should you have questions after your visit or need to cancel or reschedule your appointment, please contact [facility] [place] - A DEPT OF Eligha [facility] [phone]  and follow the prompts.  Office hours are 8:00 a.m. to 4:30 p.m. Monday - Friday. Please note that voicemails left after 4:00 p.m. may not be returned until the following business day.  We are closed weekends and major holidays. You have access to a nurse at all times for urgent questions. Please call  the main number to the clinic [phone] and follow the prompts.  For any non-urgent questions, you may also contact your provider using MyChart. We now offer e-Visits for anyone 91 and older to request care online for non-urgent symptoms. For details visit [email or web address].   Also download the MyChart app! Go to the app store, search "MyChart", open the app, select [place], and log in with your MyChart username and password.

## 2024-02-11 NOTE — Progress Notes (Signed)
Patient tolerated Xgeva injection with no complaints voiced.  Patient is taking calcium and vitamin D daily.  Site clean and dry with no bruising or swelling noted.  No complaints of pain.  Discharged with vital signs stable and no signs or symptoms of distress noted.

## 2024-02-12 LAB — CANCER ANTIGEN 27.29: CA 27.29: 41.2 U/mL — ABNORMAL HIGH (ref 0.0–38.6)

## 2024-02-17 NOTE — Progress Notes (Signed)
[facility] [address] [place] [address]    Clinic Day:  02/18/2024  Referring physician: Billie Lade, MD  Patient Care Team: Billie Lade, MD as PCP - General (Internal Medicine) [name], RN as Oncology Nurse Navigator (Oncology) [name], MD as Medical Oncologist (Medical Oncology)   ASSESSMENT & PLAN:   Assessment: 1.  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 - Started on Ibrance and Faslodex in December 2021 for metastatic disease. - PET scan on 08/10/2021 with no evidence of hypermetabolic metastatic disease.  Widespread scattered sclerotic bone metastasis in the spine and pelvis are stable and not metabolically active.  No lymphadenopathy.  Asymmetric muscular uptake in the left lower scalene muscles and in the intercostal muscles of the ventral upper left chest wall without discrete mass correlate on the CT images, favoring activity related uptake. [name] was dose reduced to 100 mg 3 weeks on/1 week off on 10/11/2021 due to fatigue.  Discontinued on 07/23/2023 due to progression - Ribociclib and exemestane started on 08/07/2023- 10/20/23-- held for rash - PET scan 10/30/2023: Stable exam with persistent increased radiotracer uptake in the sclerotic T9 and T7 vertebral bodies, slightly better compared to the previous scan from July. - She could not tolerate exemestane and ribociclib secondary to hives and rash.  She could not tolerate anastrozole due to hives. - Letrozole 2.5 mg daily started on 01/16/2024, discontinued after 1 dose due to irritation of the throat and tongue. - Guardant360 (01/23/2024): ESR1 D538G  2.  Social/family history: - She is a retired Engineer, structural.  She recently moved back from [place]. - Younger sister had breast cancer in her 53s.  Mother died of leukemia at age 63.  Paternal cousin also had breast cancer.  3.  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  She underwent immediate implants which were later removed on 12/10/2019 secondary to infection and necrosis. - XRT to the chest wall and regional lymph nodes, 60 Gray, from 02/29/2020 through 04/12/2020 - She was treated with adjuvant anastrozole which was started 12/09/2019 and was found to have metastatic disease in November 2021. - Previous genetic testing was reportedly negative and [place].    Plan: 1.  Metastatic breast cancer to the bones, ER/PR positive, HER2 negative: - She could not tolerate exemestane and ribociclib.  She could not tolerate letrozole. - We discussed Guardant360 results.  It showed ESR 1 mutation. - Reviewed labs from 02/06/2024: Normal LFTs and renal function.  CA 15-3 was elevated at 31.2, up from 29.5.  CA 27 and 29 is down to 41 from 46. - I have recommended elacestrant.  We discussed side effects in detail.  Literature was given to her. - As she had problems tolerating previous therapies, I would start at lower dose of 258 mg elacestrant daily.  Will titrate up if she tolerates well. - She will come back in 3 weeks for follow-up with repeat labs and toxicity  assessment.  2.  Peripheral neuropathy: - Tingling and numbness in the hands and feet is stable along with burning sensation. - Continue Lyrica 25 mg twice daily.  3.  Bone metastatic disease: - Continue denosumab monthly.  Last calcium level was 10.  4.   Leg swellings: - Continue Lasix daily as needed.    No orders of the defined types were placed in this encounter.     I,Katie Daubenspeck,acting as a Neurosurgeon for [name], MD.,have documented all relevant documentation on the behalf of [name], MD,as directed by  [name], MD while in the presence of [name], MD.   I, [name] MD, have reviewed the above documentation for accuracy and completeness, and I agree with the above.   [name], MD   2/19/202512:00 PM  CHIEF COMPLAINT:   Diagnosis: metastatic breast cancer    Cancer Staging  No matching staging information was found for the patient.    Prior Therapy: 1. Palbociclib and fulvestrant, 11/2020 - 07/23/23 2. Ribociclib every 4 weeks, 08/07/23 - 10/20/23, held for rash  Current Therapy: Elacestrant   HISTORY OF PRESENT ILLNESS:   Oncology History  Primary malignant neoplasm of breast with metastasis (HCC)  04/28/2019 Genetic Testing   Negative genetic testing on the common hereditary cancer panel through [facility].  The report date is April 28, 2019.  The Common Hereditary Gene Panel offered by [facility] includes sequencing and/or deletion duplication testing of the following 47 genes: APC, ATM, AXIN2, BARD1, BMPR1A, BRCA1, BRCA2, BRIP1, CDH1, CDK4, CDKN2A (p14ARF), CDKN2A (p16INK4a), CHEK2, CTNNA1, DICER1, EPCAM (Deletion/duplication testing only), GREM1 (promoter region deletion/duplication testing only), HOXB13, KIT, MEN1, MLH1, MSH2, MSH3, MSH6, MUTYH, NBN, NF1, NHTL1, PALB2, PDGFRA, PMS2, POLD1, POLE, PTEN, RAD50, RAD51C, RAD51D, SDHB, SDHC, SDHD, SMAD4, SMARCA4. STK11, TP53, TSC1, TSC2, and VHL.  The following genes were evaluated for sequence changes only: SDHA.    07/06/2021 Initial Diagnosis   Metastatic breast cancer (HCC)   07/18/2021 - 08/15/2022 Chemotherapy   Patient is on Treatment Plan : BREAST Palbociclib + Fulvestrant q28d     07/18/2021 -  01/14/2024 Chemotherapy   Patient is on Treatment Plan : BREAST Palbociclib D1-21 + Fulvestrant q28d        INTERVAL HISTORY:   Wendy Zamora is a 63 y.o. female presenting to clinic today for follow up of metastatic breast cancer. She was last seen by me on 01/14/24.  Today, she states that she is doing well overall. Her appetite level is at 50%. Her energy level is at 10%.  PAST MEDICAL HISTORY:   Past Medical History: Past Medical History:  Diagnosis Date   Allergy    Asthma    Asthma    diagnosed age 35   Bone metastasis 08/2020   was on prolia   Breast cancer (HCC)    started chemo 05/05/2019. finished 08/2019   Gangrene (HCC)    GERD (gastroesophageal reflux disease)    Hx of bilateral mastectomy 10/22/2019   Hypertension    Hypertension    started bp meds 2008   Lymphedema of arm    left arm   Osteopenia    Shingles 04/20/2020   left side    Surgical History: Past Surgical History:  Procedure Laterality Date   BIOPSY  01/22/2022   Procedure: BIOPSY;  Surgeon: Marguerita Merles, Reuel Boom, MD;  [facility];  Service: Gastroenterology;;   CARPAL TUNNEL RELEASE Bilateral 2006   ESOPHAGOGASTRODUODENOSCOPY (EGD) WITH PROPOFOL N/A 01/22/2022   Procedure: ESOPHAGOGASTRODUODENOSCOPY (EGD) WITH  PROPOFOL;  Surgeon: Marguerita Merles, Reuel Boom, MD;  Location: [facility];  Service: Gastroenterology;  Laterality: N/A;  205   hysteroscopy     age 53   mastectomy Bilateral 10/22/2019   pt had necrosis after 1st surgery, had another surgery to remove implants 12/10/2019   MOUTH SURGERY     TUBAL LIGATION     TUBAL LIGATION Bilateral 1998   WISDOM TOOTH EXTRACTION     early 39's    Social History: Social History   Socioeconomic History   Marital status: Married    Spouse name: Not on file   Number of children: 2   Years of education: Not on file   Highest education level: GED or equivalent  Occupational History   Occupation: Disability  Tobacco Use   Smoking  status: Every Day    Current packs/day: 0.50    Average packs/day: 0.5 packs/day for 40.0 years (20.0 ttl pk-yrs)    Types: Cigarettes   Smokeless tobacco: Never   Tobacco comments:    Smokes half a pack a day for 40 years.   Vaping Use   Vaping status: Never Used  Substance and Sexual Activity   Alcohol use: Never   Drug use: Never   Sexual activity: Yes  Other Topics Concern   Not on file  Social History Narrative   Lives with her husband.    Social Drivers of Health   Financial Resource Strain: Medium Risk (06/06/2023)   Overall Financial Resource Strain (CARDIA)    Difficulty of Paying Living Expenses: Somewhat hard  Food Insecurity: Food Insecurity Present (06/06/2023)   Hunger Vital Sign    Worried About Running Out of Food in the Last Year: Sometimes true    Ran Out of Food in the Last Year: Sometimes true  Transportation Needs: No Transportation Needs (06/06/2023)   PRAPARE - Administrator, Civil Service (Medical): No    Lack of Transportation (Non-Medical): No  Physical Activity: Insufficiently Active (06/06/2023)   Exercise Vital Sign    Days of Exercise per Week: 2 days    Minutes of Exercise per Session: 20 min  Stress: Stress Concern Present (06/06/2023)   [employer] of Occupational Health - Occupational Stress Questionnaire    Feeling of Stress : Rather much  Social Connections: Unknown (06/06/2023)   Social Connection and Isolation Panel [NHANES]    Frequency of Communication with Friends and Family: Twice a week    Frequency of Social Gatherings with Friends and Family: Once a week    Attends Religious Services: Patient declined    Database administrator or Organizations: No    Attends Banker Meetings: Not on file    Marital Status: Married  Intimate Partner Violence: Not At Risk (03/04/2022)   Received from [facility]   Humiliation, Afraid, Rape, and Kick questionnaire     Fear of Current or Ex-Partner: No    Emotionally Abused: No    Physically Abused: No    Sexually Abused: No    Family History: Family History  Problem Relation Age of Onset   Leukemia Mother    Breast cancer Sister    Diabetes Maternal Uncle    Hyperlipidemia Maternal Uncle    Diabetes Mellitus II Maternal Grandmother    Heart disease Maternal Grandfather    Diabetes Maternal Grandfather    Breast cancer Cousin    Breast cancer Half-Sister    Osteoporosis Half-Sister  Lupus Half-Sister    Lupus Half-Sister    Osteoporosis Half-Sister    Cancer - Lung Neg Hx    Colon cancer Neg Hx     Current Medications:  Current Outpatient Medications:    amLODipine (NORVASC) 5 MG tablet, Take 1 tablet by mouth once daily, Disp: 90 tablet, Rfl: 0   anastrozole (ARIMIDEX) 1 MG tablet, Take 1 mg by mouth daily., Disp: , Rfl:    Ascorbic Acid (VITAMIN C) 1000 MG tablet, Take 1,000 mg by mouth daily., Disp: , Rfl:    aspirin 81 MG EC tablet, Take 81 mg by mouth daily., Disp: , Rfl:    betamethasone dipropionate 0.05 % cream, Apply topically 2 (two) times daily., Disp: 30 g, Rfl: 1   Calcium-Magnesium-Vitamin D (CALCIUM 1200+D3 PO), Take 1 tablet by mouth daily., Disp: , Rfl:    cetirizine-pseudoephedrine (ZYRTEC-D) 5-120 MG tablet, Take 1 tablet by mouth daily., Disp: , Rfl:    CINNAMON PO, Take 1,000 mg by mouth daily., Disp: , Rfl:    cyanocobalamin 1000 MCG tablet, Take 1 tablet (1,000 mcg total) by mouth daily. (Patient taking differently: Take 1,000 mcg by mouth every other day.), Disp: 30 tablet, Rfl: 6   cyclobenzaprine (FLEXERIL) 10 MG tablet, Take 1 tablet (10 mg total) by mouth 2 (two) times daily as needed for muscle spasms., Disp: 60 tablet, Rfl: 5   denosumab (XGEVA) 120 MG/1.7ML SOLN injection, Inject 120 mg into the skin every 30 (thirty) days., Disp: , Rfl:    dexamethasone 0.5 mg/5 mL - diphenhydrAMINE 12.5 mg/5 mL oral solution 2:1 mixture, Take 10 mLs by mouth 2 (two) times  daily., Disp: 480 mL, Rfl: 2   elacestrant hydrochloride (ORSERDU) 86 MG tablet, Take 3 tablets (258 mg total) by mouth daily. Take with food., Disp: 90 tablet, Rfl: 0   furosemide (LASIX) 20 MG tablet, Take 1 tablet (20 mg total) by mouth daily as needed for edema (Take daily as needed for edema)., Disp: 30 tablet, Rfl: 0   HYDROcodone-acetaminophen (NORCO) 5-325 MG tablet, Take 1 tablet by mouth every 12 (twelve) hours as needed for moderate pain., Disp: 30 tablet, Rfl: 0   lansoprazole (PREVACID) 30 MG capsule, TAKE 1 CAPSULE BY MOUTH IN THE MORNING AND AT BEDTIME, Disp: 120 capsule, Rfl: 3   letrozole (FEMARA) 2.5 MG tablet, Take 1 tablet (2.5 mg total) by mouth daily., Disp: 30 tablet, Rfl: 1   Misc. Devices MISC, Please provide patient with left arm compression sleeve. Diagnosis metastatic breast cancer, bilateral mastectomy, left arm lymphedema, Disp: 1 each, Rfl: 0   NAPHCON-A 0.025-0.3 % ophthalmic solution, Place 2 drops into both eyes in the morning and at bedtime., Disp: , Rfl:    pregabalin (LYRICA) 25 MG capsule, Take 1 capsule (25 mg total) by mouth 2 (two) times daily., Disp: 60 capsule, Rfl: 2   prochlorperazine (COMPAZINE) 10 MG tablet, Take 1 tablet (10 mg total) by mouth every 6 (six) hours as needed for nausea or vomiting., Disp: 60 tablet, Rfl: 2   Allergies: Allergies  Allergen Reactions   Augmentin [Amoxicillin-Pot Clavulanate] Anaphylaxis   Ciprofloxacin Anaphylaxis   Claritin [Loratadine] Anaphylaxis   Gabapentin Anaphylaxis   Keflex [Cephalexin] Anaphylaxis   Levofloxacin Anaphylaxis   Lisinopril Swelling   Lisinopril Anaphylaxis   Metoprolol Swelling   Metoprolol Anaphylaxis   Nexium [Esomeprazole] Anaphylaxis   Sulfa Antibiotics Hives and Swelling   Tetracyclines & Related Anaphylaxis   Iohexol Hives    Needs pre meds   Ultram [  Tramadol] Other (See Comments)    Patient states medication irritates throat    Anastrozole Hives   Ceftin [Cefuroxime Axetil]  Hives   Chromium Other (See Comments)    headache   Exemestane Hives   Iodinated Contrast Media Hives   Kisqali (200 Mg Dose) [Ribociclib Succ (200 Mg Dose)] Hives   Misc. Sulfonamide Containing Compounds Other (See Comments)   Oxycodone Itching   Sulfa Antibiotics     Possible Reaction    Latex Rash    itching   Nickel Rash    itching    REVIEW OF SYSTEMS:   Review of Systems  Constitutional:  Negative for chills, fatigue and fever.  HENT:   Negative for lump/mass, mouth sores, nosebleeds, sore throat and trouble swallowing.   Eyes:  Negative for eye problems.  Respiratory:  Positive for cough and shortness of breath.   Cardiovascular:  Positive for palpitations. Negative for chest pain and leg swelling.  Gastrointestinal:  Negative for abdominal pain, constipation, diarrhea, nausea and vomiting.  Genitourinary:  Negative for bladder incontinence, difficulty urinating, dysuria, frequency, hematuria and nocturia.   Musculoskeletal:  Negative for arthralgias, back pain, flank pain, myalgias and neck pain.  Skin:  Negative for itching and rash.  Neurological:  Positive for headaches and numbness. Negative for dizziness.  Hematological:  Does not bruise/bleed easily.  Psychiatric/Behavioral:  Negative for depression, sleep disturbance and suicidal ideas. The patient is not nervous/anxious.   All other systems reviewed and are negative.    VITALS:   Blood pressure (!) 140/86, pulse 89, temperature 98.2 F (36.8 C), temperature source Oral, resp. rate 18, weight 182 lb 1.6 oz (82.6 kg), last menstrual period 06/08/2012, SpO2 98%.  Wt Readings from Last 3 Encounters:  02/18/24 182 lb 1.6 oz (82.6 kg)  01/15/24 182 lb 6.4 oz (82.7 kg)  01/14/24 180 lb 5.4 oz (81.8 kg)    Body mass index is 30.77 kg/m.  Performance status (ECOG): 1 - Symptomatic but completely ambulatory  PHYSICAL EXAM:   Physical Exam Vitals and nursing note reviewed. Exam conducted with a chaperone  present.  Constitutional:      Appearance: Normal appearance.  Cardiovascular:     Rate and Rhythm: Normal rate and regular rhythm.     Pulses: Normal pulses.     Heart sounds: Normal heart sounds.  Pulmonary:     Effort: Pulmonary effort is normal.     Breath sounds: Normal breath sounds.  Abdominal:     Palpations: Abdomen is soft. There is no hepatomegaly, splenomegaly or mass.     Tenderness: There is no abdominal tenderness.  Musculoskeletal:     Right lower leg: No edema.     Left lower leg: No edema.  Lymphadenopathy:     Cervical: No cervical adenopathy.     Right cervical: No superficial, deep or posterior cervical adenopathy.    Left cervical: No superficial, deep or posterior cervical adenopathy.     Upper Body:     Right upper body: No supraclavicular or axillary adenopathy.     Left upper body: No supraclavicular or axillary adenopathy.  Neurological:     General: No focal deficit present.     Mental Status: She is alert and oriented to person, place, and time.  Psychiatric:        Mood and Affect: Mood normal.        Behavior: Behavior normal.     LABS:   CBC     Component Value Date/Time  WBC 5.2 02/06/2024 1003   RBC 4.84 02/06/2024 1003   HGB 14.8 02/06/2024 1003   HCT 45.4 02/06/2024 1003   PLT 320 02/06/2024 1003   MCV 93.8 02/06/2024 1003   MCH 30.6 02/06/2024 1003   MCHC 32.6 02/06/2024 1003   RDW 12.5 02/06/2024 1003   LYMPHSABS 1.5 02/06/2024 1003   MONOABS 0.5 02/06/2024 1003   EOSABS 0.1 02/06/2024 1003   BASOSABS 0.1 02/06/2024 1003    CMP      Component Value Date/Time   NA 140 02/06/2024 1003   K 4.0 02/06/2024 1003   CL 104 02/06/2024 1003   CO2 24 02/06/2024 1003   GLUCOSE 97 02/06/2024 1003   BUN 11 02/06/2024 1003   CREATININE 0.84 02/06/2024 1003   CALCIUM 10.0 02/06/2024 1003   PROT 7.5 02/06/2024 1003   ALBUMIN 4.0 02/06/2024 1003   AST 30 02/06/2024 1003   ALT 29 02/06/2024 1003   ALKPHOS 55 02/06/2024 1003    BILITOT 0.8 02/06/2024 1003   GFRNONAA >60 02/06/2024 1003   GFRAA >90 06/08/2012 1251     No results found for: "CEA1", "CEA" / No results found for: "CEA1", "CEA" No results found for: "PSA1" No results found for: "ZOX096" No results found for: "CAN125"  No results found for: "TOTALPROTELP", "ALBUMINELP", "A1GS", "A2GS", "BETS", "BETA2SER", "GAMS", "MSPIKE", "SPEI" No results found for: "TIBC", "FERRITIN", "IRONPCTSAT" No results found for: "LDH"   STUDIES:   No results found.

## 2024-02-18 ENCOUNTER — Telehealth: Payer: Self-pay | Admitting: Pharmacy Technician

## 2024-02-18 ENCOUNTER — Other Ambulatory Visit (HOSPITAL_COMMUNITY): Payer: Self-pay

## 2024-02-18 ENCOUNTER — Inpatient Hospital Stay: Payer: Medicaid Other | Admitting: Hematology

## 2024-02-18 ENCOUNTER — Encounter: Payer: Self-pay | Admitting: Hematology

## 2024-02-18 ENCOUNTER — Telehealth: Payer: Self-pay

## 2024-02-18 VITALS — BP 140/86 | HR 89 | Temp 98.2°F | Resp 18 | Wt 182.1 lb

## 2024-02-18 DIAGNOSIS — Z1732 Human epidermal growth factor receptor 2 negative status: Secondary | ICD-10-CM | POA: Diagnosis not present

## 2024-02-18 DIAGNOSIS — Z9013 Acquired absence of bilateral breasts and nipples: Secondary | ICD-10-CM | POA: Diagnosis not present

## 2024-02-18 DIAGNOSIS — M8580 Other specified disorders of bone density and structure, unspecified site: Secondary | ICD-10-CM | POA: Diagnosis not present

## 2024-02-18 DIAGNOSIS — Z79899 Other long term (current) drug therapy: Secondary | ICD-10-CM | POA: Diagnosis not present

## 2024-02-18 DIAGNOSIS — C50919 Malignant neoplasm of unspecified site of unspecified female breast: Secondary | ICD-10-CM | POA: Diagnosis not present

## 2024-02-18 DIAGNOSIS — Z79811 Long term (current) use of aromatase inhibitors: Secondary | ICD-10-CM | POA: Diagnosis not present

## 2024-02-18 DIAGNOSIS — Z1721 Progesterone receptor positive status: Secondary | ICD-10-CM | POA: Diagnosis not present

## 2024-02-18 DIAGNOSIS — C7951 Secondary malignant neoplasm of bone: Secondary | ICD-10-CM | POA: Diagnosis not present

## 2024-02-18 DIAGNOSIS — Z7982 Long term (current) use of aspirin: Secondary | ICD-10-CM | POA: Diagnosis not present

## 2024-02-18 DIAGNOSIS — C50912 Malignant neoplasm of unspecified site of left female breast: Secondary | ICD-10-CM | POA: Diagnosis not present

## 2024-02-18 DIAGNOSIS — Z170 Estrogen receptor positive status [ER+]: Secondary | ICD-10-CM | POA: Diagnosis not present

## 2024-02-18 MED ORDER — PROCHLORPERAZINE MALEATE 10 MG PO TABS: 10.0000 mg | ORAL_TABLET | Freq: Four times a day (QID) | ORAL | 2 refills | Status: AC | PRN

## 2024-02-18 MED ORDER — ELACESTRANT HYDROCHLORIDE 86 MG PO TABS
258.0000 mg | ORAL_TABLET | Freq: Every day | ORAL | 0 refills | Status: DC
  Filled 2024-02-19: qty 90, 30d supply, fill #0

## 2024-02-18 NOTE — Telephone Encounter (Signed)
Oral Oncology Patient Advocate Encounter   Received notification that prior authorization for Orserdu is required.   PA submitted on 02/18/24 Key [id number] Status is pending     Patty Almedia Balls, CPhT Oncology Pharmacy Patient Advocate [facility] Direct Number: [phone] Fax: [phone]

## 2024-02-18 NOTE — Telephone Encounter (Signed)
Oral Oncology Patient Advocate Encounter  Prior Authorization for Orserdu has been approved.    PA# [id number] Effective dates: 02/18/24 through 02/17/25  Patients co-pay is $4.00.    Patty Almedia Balls, CPhT Oncology Pharmacy Patient Advocate [facility] Direct Number: [phone] Fax: [phone]

## 2024-02-18 NOTE — Patient Instructions (Signed)
[facility] at [facility] Discharge Instructions   You were seen and examined today by Dr. Ellin Saba.  He reviewed the results of your lab work which are normal/stable.   He reviewed the results of you Guardant 360 testing. It is showing you have developed a mutation that can be targeted with a pill. This is a pill that will come from a specialty pharmacy. It is called Oserdu. You may stop taking anastrozole.   We will see you back in .   Return as scheduled.    Thank you for choosing [facility] at [facility] to provide your oncology and hematology care.  To afford each patient quality time with our provider, please arrive at least 15 minutes before your scheduled appointment time.   If you have a lab appointment with the Cancer Center please come in thru the Main Entrance and check in at the main information desk.  You need to re-schedule your appointment should you arrive 10 or more minutes late.  We strive to give you quality time with our providers, and arriving late affects you and other patients whose appointments are after yours.  Also, if you no show three or more times for appointments you may be dismissed from the clinic at the providers discretion.     Again, thank you for choosing [facility].  Our hope is that these requests will decrease the amount of time that you wait before being seen by our physicians.       _____________________________________________________________  Should you have questions after your visit to [facility], please contact our office at [phone] and follow the prompts.  Our office hours are 8:00 a.m. and 4:30 p.m. Monday - Friday.  Please note that voicemails left after 4:00 p.m. may not be returned until the following business day.  We are closed weekends and major holidays.  You do have access to a nurse 24-7, just call the main number to the clinic [phone] and do not  press any options, hold on the line and a nurse will answer the phone.    For prescription refill requests, have your pharmacy contact our office and allow 72 hours.    Due to Covid, you will need to wear a mask upon entering the hospital. If you do not have a mask, a mask will be given to you at the Main Entrance upon arrival. For doctor visits, patients may have 1 support person age 28 or older with them. For treatment visits, patients can not have anyone with them due to social distancing guidelines and our immunocompromised population.

## 2024-02-18 NOTE — Telephone Encounter (Signed)
Oral Oncology Pharmacist Encounter  Received new prescription for Orserdu (elacestrant) for the treatment of metastatic ER/PR+ HER2- breast cancer, planned duration until disease progression or unacceptable drug toxicity.  CBC/CMP from 02/06/24 assessed, no baseline dose adjustments required.  Current medication list in Epic reviewed, no DDIs with elacestrant identified.  Evaluated chart and no patient barriers to medication adherence noted.   Prescription has been e-scribed to the [facility] for benefits analysis and approval.  Oral Oncology Clinic will continue to follow for insurance authorization, copayment issues, initial counseling and start date.  Jerry Caras, PharmD PGY2 Oncology Pharmacy Resident   02/18/2024 11:54 AM

## 2024-02-19 ENCOUNTER — Other Ambulatory Visit: Payer: Self-pay

## 2024-02-19 ENCOUNTER — Other Ambulatory Visit (HOSPITAL_COMMUNITY): Payer: Self-pay

## 2024-02-19 MED ORDER — ELACESTRANT HYDROCHLORIDE 86 MG PO TABS: 258.0000 mg | ORAL_TABLET | Freq: Every day | ORAL | 0 refills | Status: DC

## 2024-02-19 NOTE — Progress Notes (Signed)
Specialty Pharmacy Initial Fill Coordination Note  Wendy Zamora is a 63 y.o. female contacted today regarding initial fill of specialty medication(s) Elacestrant Hydrochloride [facility])  Patient requested Delivery   Delivery date: 02/23/24   Verified address: [address] [place], [place] [address]  Medication will be filled on 02/20/24.   Patient is aware of $4.00 copayment. Bill to [place].    Ardeen Fillers, CPhT Oncology Pharmacy Patient Advocate  [facility]  [phone] (phone) [phone] (fax) 02/19/2024 9:43 AM

## 2024-02-19 NOTE — Progress Notes (Signed)
Patient education documented in EPIC note on 02/19/24.

## 2024-02-19 NOTE — Telephone Encounter (Signed)
Patient's medication (Orserdu) is a Limited Distribution Drug and must be filled through [facility]. Script and all supporting documents have been sent to [facility] for processing and fulfillment. Patient has been disenrolled from [employer] and patient is aware to expect a call from [facility] to schedule first fill and delivery of medication. Patient also knows to call me at [phone] with any questions or concerns.    Ardeen Fillers, CPhT Oncology Pharmacy Patient Advocate  [facility]  [phone] (phone) [phone] (fax) 02/19/2024 11:37 AM

## 2024-02-19 NOTE — Telephone Encounter (Signed)
Patient successfully OnBoarded and drug education provided by pharmacist. Medication scheduled to be shipped on Friday, 02/20/24, for delivery on Monday, 02/23/24, from [facility] to patient's address. Patient also knows to call me at [phone] with any questions or concerns regarding receiving medication or if there is any unexpected change in co-pay.    Ardeen Fillers, CPhT Oncology Pharmacy Patient Advocate  [facility]  [phone] (phone) [phone] (fax) 02/19/2024 9:45 AM

## 2024-02-19 NOTE — Telephone Encounter (Addendum)
Oral Chemotherapy Pharmacist Encounter   Patient Education I spoke with patient for overview of new oral chemotherapy medication: Orserdu (elacestrant) for the treatment of metastatic ER/PR+ HER2- breast cancer, planned duration until disease progression or unacceptable drug toxicity. Patient will receive medication from [facility].   Counseled patient on administration, dosing, side effects, monitoring, drug-food interactions, safe handling, storage, and disposal. Patient will take 3 tablets (258 mg total) by mouth daily with food.  Side effects include but not limited to: myalgias, hyperlipidemia, and N/V.    Reviewed with patient importance of keeping a medication schedule and plan for any missed doses.  After discussion with patient no patient barriers to medication adherence identified.   She voiced understanding and appreciation. All questions answered. Medication handout provided.  Provided patient with Oral Chemotherapy Navigation Clinic phone number. Patient knows to call the office with questions or concerns. Oral Chemotherapy Navigation Clinic will continue to follow.   Jerry Caras, PharmD PGY2 Oncology Pharmacy Resident   02/19/2024 9:55 AM

## 2024-02-19 NOTE — Progress Notes (Signed)
Patient's medication (Orserdu) is a Limited Distribution Drug and must be filled through [facility]. Script and all supporting documents have been sent to [facility] for processing and fulfillment. Patient has been disenrolled from [employer] and patient is aware to expect a call from [facility] to schedule first fill and delivery of medication. Patient also knows to call me at [phone] with any questions or concerns.    Ardeen Fillers, CPhT Oncology Pharmacy Patient Advocate  [facility]  [phone] (phone) [phone] (fax) 02/19/2024 11:22 AM

## 2024-02-20 ENCOUNTER — Other Ambulatory Visit: Payer: Self-pay

## 2024-02-24 NOTE — Telephone Encounter (Signed)
 Per [facility], medication was delivered to patient on 02/23/24.

## 2024-03-01 ENCOUNTER — Other Ambulatory Visit: Payer: Self-pay | Admitting: *Deleted

## 2024-03-09 NOTE — Progress Notes (Signed)
 [facility] [address], [place] [address]    Clinic Day:  03/10/2024  Referring physician: Billie Lade, MD  Patient Care Team: Billie Lade, MD as PCP - General (Internal Medicine) [name], RN as Oncology Nurse Navigator (Oncology) [name], MD as Medical Oncologist (Medical Oncology)   ASSESSMENT & PLAN:   Assessment: 1.  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 - Started on Ibrance and Faslodex in December 2021 for metastatic disease. - PET scan on 08/10/2021 with no evidence of hypermetabolic metastatic disease.  Widespread scattered sclerotic bone metastasis in the spine and pelvis are stable and not metabolically active.  No lymphadenopathy.  Asymmetric muscular uptake in the left lower scalene muscles and in the intercostal muscles of the ventral upper left chest wall without discrete mass correlate on the CT images, favoring activity related uptake. Wendy Zamora was dose reduced to 100 mg 3 weeks on/1 week off on 10/11/2021 due to fatigue.  Discontinued on 07/23/2023 due to progression - Ribociclib and exemestane started on 08/07/2023- 10/20/23-- held for rash - PET scan 10/30/2023: Stable exam with persistent increased radiotracer uptake in the sclerotic T9 and T7 vertebral bodies, slightly better compared to the previous scan from July. - She could not tolerate exemestane and ribociclib secondary to hives and rash.  She could not tolerate anastrozole due to hives. - Letrozole 2.5 mg daily started on 01/16/2024, discontinued after 1 dose due to irritation of the throat and tongue. - Guardant360 (01/23/2024): ESR1 D538G - Elacestrant 258 mg daily started on 02/23/2024  2.  Social/family history: - She is a retired  Engineer, structural.  She recently moved back from [place]. - Younger sister had breast cancer in her 47s.  Mother died of leukemia at age 43.  Paternal cousin also had breast cancer.  3.  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  She underwent immediate implants which were later removed on 12/10/2019 secondary to infection and necrosis. - XRT to the chest wall and regional lymph nodes, 60 [place], from 02/29/2020 through 04/12/2020 - She was treated with adjuvant anastrozole which was started 12/09/2019 and was found to have metastatic disease in November 2021. - Previous genetic testing was reportedly negative and [place].    Plan: 1.  Metastatic breast cancer to the bones, ER/PR positive, HER2 negative: - She started elacestrant 258 mg daily on 02/23/2024. - She reported generalized itching without rash.  She also reported muscle pains particularly in the back and front of the right thigh.  She reportedly had slight swelling in the legs and dorsum of the hands yesterday.  Today there is not much swelling.  She had very mild nausea but vomited twice in the last 2 weeks. - Labs today: Normal LFTs and creatinine.  CBC was normal. - Continue elacestrant 258 mg daily for another month.  RTC 4 weeks for follow-up.  If she is tolerating it reasonably well, may increase the dose to 345  mg daily.  2.  Peripheral neuropathy: - She has tingling and numbness in the hands and feet along with burning sensation. - She is not taking her Lyrica regularly.  Continue Lyrica 25 mg twice daily.  3.  Bone metastatic disease: - Calcium today is  10.1.  She will proceed with denosumab today.  4.  Leg swellings: - Continue Lasix daily as needed.    No orders of the defined types were placed in this encounter.     [name],acting as a Neurosurgeon for [name], MD.,have documented all relevant documentation on the behalf of [name], MD,as directed by  [name], MD while in the presence of [name], MD.  I, [name] MD, have reviewed the above documentation for accuracy and completeness, and I agree with the above.    [name], MD   3/12/20251:22 PM  CHIEF COMPLAINT:   Diagnosis: metastatic breast cancer    Cancer Staging  No matching staging information was found for the patient.    Prior Therapy: 1. Palbociclib and fulvestrant, 11/2020 - 07/23/23 2. Ribociclib every 4 weeks, 08/07/23 - 10/20/23, held for rash  Current Therapy: Elacestrant   HISTORY OF PRESENT ILLNESS:   Oncology History  Primary malignant neoplasm of breast with metastasis (HCC)  04/28/2019 Genetic Testing   Negative genetic testing on the common hereditary cancer panel through [facility].  The report date is April 28, 2019.  The Common Hereditary Gene Panel offered by [facility] includes sequencing and/or deletion duplication testing of the following 47 genes: APC, ATM, AXIN2, BARD1, BMPR1A, BRCA1, BRCA2, BRIP1, CDH1, CDK4, CDKN2A (p14ARF), CDKN2A (p16INK4a), CHEK2, CTNNA1, DICER1, EPCAM (Deletion/duplication testing only), GREM1 (promoter region deletion/duplication testing only), HOXB13, KIT, MEN1, MLH1, MSH2, MSH3, MSH6, MUTYH, NBN, NF1, NHTL1, PALB2, PDGFRA, PMS2, POLD1, POLE, PTEN, RAD50, RAD51C, RAD51D, SDHB, SDHC, SDHD, SMAD4, SMARCA4. STK11, TP53, TSC1, TSC2, and VHL.  The following genes were evaluated for sequence changes only: SDHA.    07/06/2021 Initial Diagnosis   Metastatic breast cancer (HCC)   07/18/2021 - 08/15/2022 Chemotherapy   Patient is on Treatment Plan : BREAST  Palbociclib + Fulvestrant q28d     07/18/2021 - 01/14/2024 Chemotherapy   Patient is on Treatment Plan : BREAST Palbociclib D1-21 + Fulvestrant q28d        INTERVAL HISTORY:   Wendy Zamora is a 63 y.o. female presenting to clinic today for follow up of metastatic breast cancer. She was last seen by me on 02/18/24.  Today, she states that she is doing well overall. Her appetite level is at 75%. Her energy level is at 70%.  PAST MEDICAL HISTORY:   Past Medical History: Past Medical History:  Diagnosis Date   Allergy    Asthma    Asthma    diagnosed age 88   Bone metastasis 08/2020   was on prolia   Breast cancer (HCC)    started chemo 05/05/2019. finished 08/2019   Gangrene (HCC)    GERD (gastroesophageal reflux disease)    Hx of bilateral mastectomy 10/22/2019   Hypertension    Hypertension    started bp meds 2008   Lymphedema of arm    left arm   Osteopenia    Shingles 04/20/2020   left side    Surgical History: Past Surgical History:  Procedure Laterality Date   BIOPSY  01/22/2022   Procedure: BIOPSY;  Surgeon: Dolores Frame, MD;  [facility]: [facility];  Service: Gastroenterology;;   CARPAL TUNNEL RELEASE Bilateral 2006  ESOPHAGOGASTRODUODENOSCOPY (EGD) WITH PROPOFOL N/A 01/22/2022   Procedure: ESOPHAGOGASTRODUODENOSCOPY (EGD) WITH PROPOFOL;  Surgeon: Dolores Frame, MD;  Location: [facility];  Service: Gastroenterology;  Laterality: N/A;  205   hysteroscopy     age 53   mastectomy Bilateral 10/22/2019   pt had necrosis after 1st surgery, had another surgery to remove implants 12/10/2019   MOUTH SURGERY     TUBAL LIGATION     TUBAL LIGATION Bilateral 1998   WISDOM TOOTH EXTRACTION     early 69's    Social History: Social History   Socioeconomic History   Marital status: Married    Spouse name: Not on file   Number of children: 2   Years of education: Not on file   Highest education level: GED or equivalent  Occupational History    Occupation: Disability  Tobacco Use   Smoking status: Every Day    Current packs/day: 0.50    Average packs/day: 0.5 packs/day for 40.0 years (20.0 ttl pk-yrs)    Types: Cigarettes   Smokeless tobacco: Never   Tobacco comments:    Smokes half a pack a day for 40 years.   Vaping Use   Vaping status: Never Used  Substance and Sexual Activity   Alcohol use: Never   Drug use: Never   Sexual activity: Yes  Other Topics Concern   Not on file  Social History Narrative   Lives with her husband.    Social Drivers of Health   Financial Resource Strain: Medium Risk (06/06/2023)   Overall Financial Resource Strain (CARDIA)    Difficulty of Paying Living Expenses: Somewhat hard  Food Insecurity: Food Insecurity Present (06/06/2023)   Hunger Vital Sign    Worried About Running Out of Food in the Last Year: Sometimes true    Ran Out of Food in the Last Year: Sometimes true  Transportation Needs: No Transportation Needs (06/06/2023)   PRAPARE - Administrator, Civil Service (Medical): No    Lack of Transportation (Non-Medical): No  Physical Activity: Insufficiently Active (06/06/2023)   Exercise Vital Sign    Days of Exercise per Week: 2 days    Minutes of Exercise per Session: 20 min  Stress: Stress Concern Present (06/06/2023)   [employer] of Occupational Health - Occupational Stress Questionnaire    Feeling of Stress : Rather much  Social Connections: Unknown (06/06/2023)   Social Connection and Isolation Panel [NHANES]    Frequency of Communication with Friends and Family: Twice a week    Frequency of Social Gatherings with Friends and Family: Once a week    Attends Religious Services: Patient declined    Database administrator or Organizations: No    Attends Banker Meetings: Not on file    Marital Status: Married  Intimate Partner Violence: Not At Risk (03/04/2022)   Received from [facility]    Humiliation, Afraid, Rape, and Kick questionnaire    Fear of Current or Ex-Partner: No    Emotionally Abused: No    Physically Abused: No    Sexually Abused: No    Family History: Family History  Problem Relation Age of Onset   Leukemia Mother    Breast cancer Sister    Diabetes Maternal Uncle    Hyperlipidemia Maternal Uncle    Diabetes Mellitus II Maternal Grandmother    Heart disease Maternal Grandfather    Diabetes Maternal Grandfather    Breast cancer Cousin  Breast cancer Half-Sister    Osteoporosis Half-Sister    Lupus Half-Sister    Lupus Half-Sister    Osteoporosis Half-Sister    Cancer - Lung Neg Hx    Colon cancer Neg Hx     Current Medications:  Current Outpatient Medications:    amLODipine (NORVASC) 5 MG tablet, Take 1 tablet by mouth once daily, Disp: 90 tablet, Rfl: 0   Ascorbic Acid (VITAMIN C) 1000 MG tablet, Take 1,000 mg by mouth daily., Disp: , Rfl:    aspirin 81 MG EC tablet, Take 81 mg by mouth daily., Disp: , Rfl:    betamethasone dipropionate 0.05 % cream, Apply topically 2 (two) times daily., Disp: 30 g, Rfl: 1   Calcium-Magnesium-Vitamin D (CALCIUM 1200+D3 PO), Take 1 tablet by mouth daily., Disp: , Rfl:    cetirizine-pseudoephedrine (ZYRTEC-D) 5-120 MG tablet, Take 1 tablet by mouth daily., Disp: , Rfl:    CINNAMON PO, Take 1,000 mg by mouth daily., Disp: , Rfl:    cyanocobalamin 1000 MCG tablet, Take 1 tablet (1,000 mcg total) by mouth daily. (Patient taking differently: Take 1,000 mcg by mouth every other day.), Disp: 30 tablet, Rfl: 6   cyclobenzaprine (FLEXERIL) 10 MG tablet, Take 1 tablet (10 mg total) by mouth 2 (two) times daily as needed for muscle spasms., Disp: 60 tablet, Rfl: 5   denosumab (XGEVA) 120 MG/1.7ML SOLN injection, Inject 120 mg into the skin every 30 (thirty) days., Disp: , Rfl:    dexamethasone 0.5 mg/5 mL - diphenhydrAMINE 12.5 mg/5 mL oral solution 2:1 mixture, Take 10 mLs by mouth 2 (two) times daily., Disp: 480 mL, Rfl:  2   HYDROcodone-acetaminophen (NORCO) 5-325 MG tablet, Take 1 tablet by mouth every 12 (twelve) hours as needed for moderate pain., Disp: 30 tablet, Rfl: 0   lansoprazole (PREVACID) 30 MG capsule, TAKE 1 CAPSULE BY MOUTH IN THE MORNING AND AT BEDTIME, Disp: 120 capsule, Rfl: 3   Misc. Devices MISC, Please provide patient with left arm compression sleeve. Diagnosis metastatic breast cancer, bilateral mastectomy, left arm lymphedema, Disp: 1 each, Rfl: 0   NAPHCON-A 0.025-0.3 % ophthalmic solution, Place 2 drops into both eyes in the morning and at bedtime., Disp: , Rfl:    pregabalin (LYRICA) 25 MG capsule, Take 1 capsule (25 mg total) by mouth 2 (two) times daily., Disp: 60 capsule, Rfl: 2   prochlorperazine (COMPAZINE) 10 MG tablet, Take 1 tablet (10 mg total) by mouth every 6 (six) hours as needed for nausea or vomiting., Disp: 60 tablet, Rfl: 2   elacestrant hydrochloride (ORSERDU) 86 MG tablet, Take 3 tablets (258 mg total) by mouth daily. Take with food., Disp: 90 tablet, Rfl: 0   furosemide (LASIX) 20 MG tablet, Take 1 tablet (20 mg total) by mouth daily as needed for edema (Take daily as needed for edema)., Disp: 30 tablet, Rfl: 2 No current facility-administered medications for this visit.  Facility-Administered Medications Ordered in Other Visits:    denosumab (XGEVA) injection 120 mg, 120 mg, Subcutaneous, Once, [name], MD   Allergies: Allergies  Allergen Reactions   Augmentin [Amoxicillin-Pot Clavulanate] Anaphylaxis   Ciprofloxacin Anaphylaxis   Claritin [Loratadine] Anaphylaxis   Gabapentin Anaphylaxis   Keflex [Cephalexin] Anaphylaxis   Levofloxacin Anaphylaxis   Lisinopril Swelling   Lisinopril Anaphylaxis   Metoprolol Swelling   Metoprolol Anaphylaxis   Nexium [Esomeprazole] Anaphylaxis   Sulfa Antibiotics Hives and Swelling   Tetracyclines & Related Anaphylaxis   Iohexol Hives    Needs pre meds  Ultram [Tramadol] Other (See Comments)    Patient states  medication irritates throat    Anastrozole Hives   Ceftin [Cefuroxime Axetil] Hives   Chromium Other (See Comments)    headache   Exemestane Hives   Iodinated Contrast Media Hives   Kisqali (200 Mg Dose) [Ribociclib Succ (200 Mg Dose)] Hives   Misc. Sulfonamide Containing Compounds Other (See Comments)   Oxycodone Itching   Sulfa Antibiotics     Possible Reaction    Latex Rash    itching   Nickel Rash    itching    REVIEW OF SYSTEMS:   Review of Systems  Constitutional:  Negative for chills, fatigue and fever.  HENT:   Negative for lump/mass, mouth sores, nosebleeds, sore throat and trouble swallowing.   Eyes:  Negative for eye problems.  Respiratory:  Positive for shortness of breath. Negative for cough.   Cardiovascular:  Negative for chest pain, leg swelling and palpitations.  Gastrointestinal:  Positive for constipation and nausea. Negative for abdominal pain, diarrhea and vomiting.  Genitourinary:  Negative for bladder incontinence, difficulty urinating, dysuria, frequency, hematuria and nocturia.   Musculoskeletal:  Negative for arthralgias, back pain, flank pain, myalgias and neck pain.  Skin:  Negative for itching and rash.  Neurological:  Positive for dizziness and numbness. Negative for headaches.  Hematological:  Does not bruise/bleed easily.  Psychiatric/Behavioral:  Negative for depression, sleep disturbance and suicidal ideas. The patient is nervous/anxious.   All other systems reviewed and are negative.    VITALS:   Blood pressure 115/74, pulse 98, temperature 98.1 F (36.7 C), temperature source Oral, resp. rate (!) 73, height 5' 4.5" (1.638 m), weight 179 lb 0.2 oz (81.2 kg), last menstrual period 06/08/2012, SpO2 99%.  Wt Readings from Last 3 Encounters:  03/10/24 179 lb 0.2 oz (81.2 kg)  02/18/24 182 lb 1.6 oz (82.6 kg)  01/15/24 182 lb 6.4 oz (82.7 kg)    Body mass index is 30.25 kg/m.  Performance status (ECOG): 1 - Symptomatic but completely  ambulatory  PHYSICAL EXAM:   Physical Exam Vitals and nursing note reviewed. Exam conducted with a chaperone present.  Constitutional:      Appearance: Normal appearance.  Cardiovascular:     Rate and Rhythm: Normal rate and regular rhythm.     Pulses: Normal pulses.     Heart sounds: Normal heart sounds.  Pulmonary:     Effort: Pulmonary effort is normal.     Breath sounds: Normal breath sounds.  Abdominal:     Palpations: Abdomen is soft. There is no hepatomegaly, splenomegaly or mass.     Tenderness: There is no abdominal tenderness.  Musculoskeletal:     Right lower leg: No edema.     Left lower leg: No edema.  Lymphadenopathy:     Cervical: No cervical adenopathy.     Right cervical: No superficial, deep or posterior cervical adenopathy.    Left cervical: No superficial, deep or posterior cervical adenopathy.     Upper Body:     Right upper body: No supraclavicular or axillary adenopathy.     Left upper body: No supraclavicular or axillary adenopathy.  Neurological:     General: No focal deficit present.     Mental Status: She is alert and oriented to person, place, and time.  Psychiatric:        Mood and Affect: Mood normal.        Behavior: Behavior normal.     LABS:   CBC  Component Value Date/Time   WBC 5.6 03/10/2024 1220   RBC 4.81 03/10/2024 1220   HGB 14.3 03/10/2024 1220   HCT 43.7 03/10/2024 1220   PLT 317 03/10/2024 1220   MCV 90.9 03/10/2024 1220   MCH 29.7 03/10/2024 1220   MCHC 32.7 03/10/2024 1220   RDW 12.7 03/10/2024 1220   LYMPHSABS 1.7 03/10/2024 1220   MONOABS 0.5 03/10/2024 1220   EOSABS 0.1 03/10/2024 1220   BASOSABS 0.1 03/10/2024 1220    CMP      Component Value Date/Time   NA 140 03/10/2024 1220   K 3.7 03/10/2024 1220   CL 106 03/10/2024 1220   CO2 24 03/10/2024 1220   GLUCOSE 94 03/10/2024 1220   BUN 14 03/10/2024 1220   CREATININE 0.81 03/10/2024 1220   CALCIUM 10.1 03/10/2024 1220   PROT 7.4 03/10/2024 1220    ALBUMIN 4.0 03/10/2024 1220   AST 27 03/10/2024 1220   ALT 25 03/10/2024 1220   ALKPHOS 58 03/10/2024 1220   BILITOT 0.5 03/10/2024 1220   GFRNONAA >60 03/10/2024 1220   GFRAA >90 06/08/2012 1251     No results found for: "CEA1", "CEA" / No results found for: "CEA1", "CEA" No results found for: "PSA1" No results found for: "ZOX096" No results found for: "CAN125"  No results found for: "TOTALPROTELP", "ALBUMINELP", "A1GS", "A2GS", "BETS", "BETA2SER", "GAMS", "MSPIKE", "SPEI" No results found for: "TIBC", "FERRITIN", "IRONPCTSAT" No results found for: "LDH"   STUDIES:   No results found.

## 2024-03-10 ENCOUNTER — Other Ambulatory Visit: Payer: Self-pay | Admitting: *Deleted

## 2024-03-10 ENCOUNTER — Inpatient Hospital Stay: Payer: Medicaid Other

## 2024-03-10 ENCOUNTER — Inpatient Hospital Stay: Payer: Medicaid Other | Attending: Hematology

## 2024-03-10 ENCOUNTER — Inpatient Hospital Stay: Payer: Medicaid Other | Admitting: Hematology

## 2024-03-10 DIAGNOSIS — C50919 Malignant neoplasm of unspecified site of unspecified female breast: Secondary | ICD-10-CM

## 2024-03-10 DIAGNOSIS — Z79811 Long term (current) use of aromatase inhibitors: Secondary | ICD-10-CM | POA: Diagnosis not present

## 2024-03-10 DIAGNOSIS — M810 Age-related osteoporosis without current pathological fracture: Secondary | ICD-10-CM

## 2024-03-10 DIAGNOSIS — Z1721 Progesterone receptor positive status: Secondary | ICD-10-CM | POA: Insufficient documentation

## 2024-03-10 DIAGNOSIS — Z170 Estrogen receptor positive status [ER+]: Secondary | ICD-10-CM | POA: Diagnosis not present

## 2024-03-10 DIAGNOSIS — Z7982 Long term (current) use of aspirin: Secondary | ICD-10-CM | POA: Diagnosis not present

## 2024-03-10 DIAGNOSIS — Z1732 Human epidermal growth factor receptor 2 negative status: Secondary | ICD-10-CM | POA: Diagnosis not present

## 2024-03-10 DIAGNOSIS — Z9013 Acquired absence of bilateral breasts and nipples: Secondary | ICD-10-CM | POA: Diagnosis not present

## 2024-03-10 DIAGNOSIS — Z79899 Other long term (current) drug therapy: Secondary | ICD-10-CM | POA: Insufficient documentation

## 2024-03-10 DIAGNOSIS — C50912 Malignant neoplasm of unspecified site of left female breast: Secondary | ICD-10-CM | POA: Insufficient documentation

## 2024-03-10 DIAGNOSIS — C7951 Secondary malignant neoplasm of bone: Secondary | ICD-10-CM | POA: Insufficient documentation

## 2024-03-10 DIAGNOSIS — F17210 Nicotine dependence, cigarettes, uncomplicated: Secondary | ICD-10-CM | POA: Insufficient documentation

## 2024-03-10 LAB — CBC WITH DIFFERENTIAL/PLATELET
Abs Immature Granulocytes: 0.02 10*3/uL (ref 0.00–0.07)
Basophils Absolute: 0.1 10*3/uL (ref 0.0–0.1)
Basophils Relative: 1 %
Eosinophils Absolute: 0.1 10*3/uL (ref 0.0–0.5)
Eosinophils Relative: 1 %
HCT: 43.7 % (ref 36.0–46.0)
Hemoglobin: 14.3 g/dL (ref 12.0–15.0)
Immature Granulocytes: 0 %
Lymphocytes Relative: 29 %
Lymphs Abs: 1.7 10*3/uL (ref 0.7–4.0)
MCH: 29.7 pg (ref 26.0–34.0)
MCHC: 32.7 g/dL (ref 30.0–36.0)
MCV: 90.9 fL (ref 80.0–100.0)
Monocytes Absolute: 0.5 10*3/uL (ref 0.1–1.0)
Monocytes Relative: 9 %
Neutro Abs: 3.3 10*3/uL (ref 1.7–7.7)
Neutrophils Relative %: 60 %
Platelets: 317 10*3/uL (ref 150–400)
RBC: 4.81 MIL/uL (ref 3.87–5.11)
RDW: 12.7 % (ref 11.5–15.5)
WBC: 5.6 10*3/uL (ref 4.0–10.5)
nRBC: 0 % (ref 0.0–0.2)

## 2024-03-10 LAB — COMPREHENSIVE METABOLIC PANEL
ALT: 25 U/L (ref 0–44)
AST: 27 U/L (ref 15–41)
Albumin: 4 g/dL (ref 3.5–5.0)
Alkaline Phosphatase: 58 U/L (ref 38–126)
Anion gap: 10 (ref 5–15)
BUN: 14 mg/dL (ref 8–23)
CO2: 24 mmol/L (ref 22–32)
Calcium: 10.1 mg/dL (ref 8.9–10.3)
Chloride: 106 mmol/L (ref 98–111)
Creatinine, Ser: 0.81 mg/dL (ref 0.44–1.00)
GFR, Estimated: >60 mL/min (ref >60)
Glucose, Bld: 94 mg/dL (ref 70–99)
Potassium: 3.7 mmol/L (ref 3.5–5.1)
Sodium: 140 mmol/L (ref 135–145)
Total Bilirubin: 0.5 mg/dL (ref 0.0–1.2)
Total Protein: 7.4 g/dL (ref 6.5–8.1)

## 2024-03-10 LAB — MAGNESIUM: Magnesium: 2 mg/dL (ref 1.7–2.4)

## 2024-03-10 MED ORDER — DENOSUMAB 120 MG/1.7ML ~~LOC~~ SOLN
120.0000 mg | Freq: Once | SUBCUTANEOUS | Status: AC
  Administered 2024-03-10: 120 mg via SUBCUTANEOUS
  Filled 2024-03-10: qty 1.7

## 2024-03-10 MED ORDER — ELACESTRANT HYDROCHLORIDE 86 MG PO TABS: 258.0000 mg | ORAL_TABLET | Freq: Every day | ORAL | 0 refills | Status: DC

## 2024-03-10 MED ORDER — FUROSEMIDE 20 MG PO TABS
20.0000 mg | ORAL_TABLET | Freq: Every day | ORAL | 2 refills | Status: AC | PRN
Start: 2024-03-10 — End: ?

## 2024-03-10 NOTE — Progress Notes (Signed)
 SOMMER SPICKARD presents today for Xgeva injection per the provider's orders.  Patient has been taking Vitamin D and Calcium, has had no dental work prior or upcoming and has had no jaw pain.  Message received from Chapman Moss RN/Dr. Ellin Saba patient okay for injection.  Stable during administration without incident; injection site WNL; see MAR for injection details.  Patient tolerated procedure well and without incident.  No questions or complaints noted at this time.

## 2024-03-10 NOTE — Progress Notes (Signed)
Patient is taking Orserdu as prescribed.  She has not missed any doses and reports no side effects at this time.

## 2024-03-10 NOTE — Patient Instructions (Signed)
 [facility] [place] - A DEPT OF [facility]  Discharge Instructions: Thank you for choosing [facility] to provide your oncology and hematology care.  If you have a lab appointment with the Cancer Center - please note that after April 8th, 2024, all labs will be drawn in the cancer center.  You do not have to check in or register with the main entrance as you have in the past but will complete your check-in in the cancer center.  Wear comfortable clothing and clothing appropriate for easy access to any Portacath or PICC line.   We strive to give you quality time with your provider. You may need to reschedule your appointment if you arrive late (15 or more minutes).  Arriving late affects you and other patients whose appointments are after yours.  Also, if you miss three or more appointments without notifying the office, you may be dismissed from the clinic at the provider's discretion.      For prescription refill requests, have your pharmacy contact our office and allow 72 hours for refills to be completed.    Today you received the following chemotherapy and/or immunotherapy agents Xgeva      To help prevent nausea and vomiting after your treatment, we encourage you to take your nausea medication as directed.  BELOW ARE SYMPTOMS THAT SHOULD BE REPORTED IMMEDIATELY: *FEVER GREATER THAN 100.4 F (38 C) OR HIGHER *CHILLS OR SWEATING *NAUSEA AND VOMITING THAT IS NOT CONTROLLED WITH YOUR NAUSEA MEDICATION *UNUSUAL SHORTNESS OF BREATH *UNUSUAL BRUISING OR BLEEDING *URINARY PROBLEMS (pain or burning when urinating, or frequent urination) *BOWEL PROBLEMS (unusual diarrhea, constipation, pain near the anus) TENDERNESS IN MOUTH AND THROAT WITH OR WITHOUT PRESENCE OF ULCERS (sore throat, sores in mouth, or a toothache) UNUSUAL RASH, SWELLING OR PAIN  UNUSUAL VAGINAL DISCHARGE OR ITCHING   Items with * indicate a potential emergency and should be followed up as  soon as possible or go to the Emergency Department if any problems should occur.  Please show the CHEMOTHERAPY ALERT CARD or IMMUNOTHERAPY ALERT CARD at check-in to the Emergency Department and triage nurse.  Should you have questions after your visit or need to cancel or reschedule your appointment, please contact [facility] [place] - A DEPT OF Eligha Brideg[facility] [phone]  and follow the prompts.  Office hours are 8:00 a.m. to 4:30 p.m. Monday - Friday. Please note that voicemails left after 4:00 p.m. may not be returned until the following business day.  We are closed weekends and major holidays. You have access to a nurse at all times for urgent questions. Please call the main number to the clinic [phone] and follow the prompts.  For any non-urgent questions, you may also contact your provider using MyChart. We now offer e-Visits for anyone 65 and older to request care online for non-urgent symptoms. For details visit [email or web address].   Also download the MyChart app! Go to the app store, search "MyChart", open the app, select [place], and log in with your MyChart username and password.

## 2024-03-10 NOTE — Patient Instructions (Signed)
 [facility] at [facility] Discharge Instructions   You were seen and examined today by Dr. Ellin Saba.  He reviewed the results of your lab work which are normal/stable.   Continue Orserdu as prescribed.   We will proceed with your Xgeva injection today.   Return as scheduled.    Thank you for choosing  Cancer Center at [facility] to provide your oncology and hematology care.  To afford each patient quality time with our provider, please arrive at least 15 minutes before your scheduled appointment time.   If you have a lab appointment with the Cancer Center please come in thru the Main Entrance and check in at the main information desk.  You need to re-schedule your appointment should you arrive 10 or more minutes late.  We strive to give you quality time with our providers, and arriving late affects you and other patients whose appointments are after yours.  Also, if you no show three or more times for appointments you may be dismissed from the clinic at the providers discretion.     Again, thank you for choosing [facility].  Our hope is that these requests will decrease the amount of time that you wait before being seen by our physicians.       _____________________________________________________________  Should you have questions after your visit to [facility], please contact our office at [phone] and follow the prompts.  Our office hours are 8:00 a.m. and 4:30 p.m. Monday - Friday.  Please note that voicemails left after 4:00 p.m. may not be returned until the following business day.  We are closed weekends and major holidays.  You do have access to a nurse 24-7, just call the main number to the clinic [phone] and do not press any options, hold on the line and a nurse will answer the phone.    For prescription refill requests, have your pharmacy contact our office and allow 72 hours.    Due to Covid,  you will need to wear a mask upon entering the hospital. If you do not have a mask, a mask will be given to you at the Main Entrance upon arrival. For doctor visits, patients may have 1 support person age 60 or older with them. For treatment visits, patients can not have anyone with them due to social distancing guidelines and our immunocompromised population.

## 2024-03-11 ENCOUNTER — Encounter: Payer: Self-pay | Admitting: Hematology

## 2024-03-11 LAB — CANCER ANTIGEN 15-3: CA 15-3: 29.7 U/mL — ABNORMAL HIGH (ref 0.0–25.0)

## 2024-03-11 LAB — CANCER ANTIGEN 27.29: CA 27.29: 38.6 U/mL (ref 0.0–38.6)

## 2024-03-11 MED ORDER — PREGABALIN 25 MG PO CAPS: 25.0000 mg | ORAL_CAPSULE | Freq: Two times a day (BID) | ORAL | 2 refills | Status: DC

## 2024-03-12 ENCOUNTER — Other Ambulatory Visit: Payer: Self-pay | Admitting: Hematology

## 2024-03-15 ENCOUNTER — Encounter: Payer: Self-pay | Admitting: Hematology

## 2024-03-19 ENCOUNTER — Ambulatory Visit: Payer: Medicaid Other | Admitting: Internal Medicine

## 2024-03-19 VITALS — BP 137/80 | HR 87 | Ht 64.5 in | Wt 178.6 lb

## 2024-03-19 DIAGNOSIS — I10 Essential (primary) hypertension: Secondary | ICD-10-CM

## 2024-03-19 DIAGNOSIS — C50919 Malignant neoplasm of unspecified site of unspecified female breast: Secondary | ICD-10-CM

## 2024-03-19 DIAGNOSIS — R1310 Dysphagia, unspecified: Secondary | ICD-10-CM | POA: Diagnosis not present

## 2024-03-19 DIAGNOSIS — E782 Mixed hyperlipidemia: Secondary | ICD-10-CM

## 2024-03-19 NOTE — Patient Instructions (Signed)
It was a pleasure to see you today.  Thank you for giving us the opportunity to be involved in your care.  Below is a brief recap of your visit and next steps.  We will plan to see you again in 3 months.  Summary No medication changes today. We will plan for follow up in 3 months.   

## 2024-03-19 NOTE — Assessment & Plan Note (Signed)
 Recently seen by gastroenterology for follow-up.  She continues to take lansoprazole, which is effective.  GI follow-up is scheduled for July.

## 2024-03-19 NOTE — Progress Notes (Signed)
 Established Patient Office Visit  Subjective   Patient ID: Wendy Zamora, female    DOB: 1961/01/30  Age: 63 y.o. MRN: [id number]  Chief Complaint  Patient presents with   Care Management    Six month follow up    vein    Veins breaking threw under armpit and below breast , very itchy    Wendy Zamora returns to care today for routine follow-up.  She was last evaluated by me in September 2024.  No medication changes were made at that time and 97-month follow-up was arranged.  In the interim, she has been seen by oncology and gastroenterology for follow-up.  There have otherwise been no acute interval events. Today she reports feeling fairly well.  She has no acute concerns to discuss.  Past Medical History:  Diagnosis Date   Allergy    Asthma    Asthma    diagnosed age 72   Bone metastasis 08/2020   was on prolia   Breast cancer (HCC)    started chemo 05/05/2019. finished 08/2019   Gangrene (HCC)    GERD (gastroesophageal reflux disease)    Hx of bilateral mastectomy 10/22/2019   Hypertension    Hypertension    started bp meds 2008   Lymphedema of arm    left arm   Osteopenia    Shingles 04/20/2020   left side   Past Surgical History:  Procedure Laterality Date   BIOPSY  01/22/2022   Procedure: BIOPSY;  Surgeon: Marguerita Merles, Reuel Boom, MD;  Location: [facility];  Service: Gastroenterology;;   Fidela Salisbury RELEASE Bilateral 2006   ESOPHAGOGASTRODUODENOSCOPY (EGD) WITH PROPOFOL N/A 01/22/2022   Procedure: ESOPHAGOGASTRODUODENOSCOPY (EGD) WITH PROPOFOL;  Surgeon: Dolores Frame, MD;  Location: [facility];  Service: Gastroenterology;  Laterality: N/A;  205   hysteroscopy     age 33   mastectomy Bilateral 10/22/2019   pt had necrosis after 1st surgery, had another surgery to remove implants 12/10/2019   MOUTH SURGERY     TUBAL LIGATION     TUBAL LIGATION Bilateral 1998   WISDOM TOOTH EXTRACTION     early 33's   Social History   Tobacco Use    Smoking status: Every Day    Current packs/day: 0.50    Average packs/day: 0.5 packs/day for 40.0 years (20.0 ttl pk-yrs)    Types: Cigarettes   Smokeless tobacco: Never   Tobacco comments:    Smokes half a pack a day for 40 years.   Vaping Use   Vaping status: Never Used  Substance Use Topics   Alcohol use: Never   Drug use: Never   Family History  Problem Relation Age of Onset   Leukemia Mother    Breast cancer Sister    Diabetes Maternal Uncle    Hyperlipidemia Maternal Uncle    Diabetes Mellitus II Maternal Grandmother    Heart disease Maternal Grandfather    Diabetes Maternal Grandfather    Breast cancer Cousin    Breast cancer Half-Sister    Osteoporosis Half-Sister    Lupus Half-Sister    Lupus Half-Sister    Osteoporosis Half-Sister    Cancer - Lung Neg Hx    Colon cancer Neg Hx    Allergies  Allergen Reactions   Augmentin [Amoxicillin-Pot Clavulanate] Anaphylaxis   Ciprofloxacin Anaphylaxis   Claritin [Loratadine] Anaphylaxis   Gabapentin Anaphylaxis   Keflex [Cephalexin] Anaphylaxis   Levofloxacin Anaphylaxis   Lisinopril Swelling   Lisinopril Anaphylaxis   Metoprolol Swelling  Metoprolol Anaphylaxis   Nexium [Esomeprazole] Anaphylaxis   Sulfa Antibiotics Hives and Swelling   Tetracyclines & Related Anaphylaxis   Iohexol Hives    Needs pre meds   Ultram [Tramadol] Other (See Comments)    Patient states medication irritates throat    Anastrozole Hives   Ceftin [Cefuroxime Axetil] Hives   Chromium Other (See Comments)    headache   Exemestane Hives   Iodinated Contrast Media Hives   Kisqali (200 Mg Dose) [Ribociclib Succ (200 Mg Dose)] Hives   Misc. Sulfonamide Containing Compounds Other (See Comments)   Oxycodone Itching   Sulfa Antibiotics     Possible Reaction    Latex Rash    itching   Nickel Rash    itching   Review of Systems  Constitutional:  Negative for chills and fever.  HENT:  Negative for sore throat.   Respiratory:  Negative  for cough and shortness of breath.   Cardiovascular:  Negative for chest pain, palpitations and leg swelling.  Gastrointestinal:  Negative for abdominal pain, blood in stool, constipation, diarrhea, nausea and vomiting.  Genitourinary:  Negative for dysuria and hematuria.  Musculoskeletal:  Negative for myalgias.  Skin:  Negative for itching and rash.  Neurological:  Negative for dizziness and headaches.  Psychiatric/Behavioral:  Negative for depression and suicidal ideas.      Objective:     BP 137/80 (BP Location: Right Arm, Patient Position: Sitting, Cuff Size: Normal)   Pulse 87   Ht 5' 4.5" (1.638 m)   Wt 178 lb 9.6 oz (81 kg)   LMP 06/08/2012   SpO2 97%   BMI 30.18 kg/m  BP Readings from Last 3 Encounters:  03/19/24 137/80  03/10/24 115/74  02/18/24 (!) 140/86   Physical Exam Vitals reviewed.  Constitutional:      General: She is not in acute distress.    Appearance: Normal appearance. She is obese. She is not toxic-appearing.  HENT:     Head: Normocephalic and atraumatic.     Right Ear: External ear normal.     Left Ear: External ear normal.     Nose: Nose normal. No congestion or rhinorrhea.     Mouth/Throat:     Mouth: Mucous membranes are moist.     Pharynx: Oropharynx is clear. No oropharyngeal exudate or posterior oropharyngeal erythema.  Eyes:     General: No scleral icterus.    Extraocular Movements: Extraocular movements intact.     Conjunctiva/sclera: Conjunctivae normal.     Pupils: Pupils are equal, round, and reactive to light.  Cardiovascular:     Rate and Rhythm: Normal rate and regular rhythm.     Pulses: Normal pulses.     Heart sounds: Normal heart sounds. No murmur heard.    No friction rub. No gallop.  Pulmonary:     Effort: Pulmonary effort is normal.     Breath sounds: Normal breath sounds. No wheezing, rhonchi or rales.  Abdominal:     General: Abdomen is flat. Bowel sounds are normal. There is no distension.     Palpations: Abdomen  is soft.     Tenderness: There is no abdominal tenderness.  Musculoskeletal:        General: No swelling. Normal range of motion.     Cervical back: Normal range of motion.     Right lower leg: No edema.     Left lower leg: No edema.     Comments: Compression sleeve on left arm  Lymphadenopathy:  Cervical: No cervical adenopathy.  Skin:    General: Skin is warm and dry.     Capillary Refill: Capillary refill takes less than 2 seconds.     Coloration: Skin is not jaundiced.  Neurological:     General: No focal deficit present.     Mental Status: She is alert and oriented to person, place, and time.  Psychiatric:        Mood and Affect: Mood normal.        Behavior: Behavior normal.   Last CBC Lab Results  Component Value Date   WBC 5.6 03/10/2024   HGB 14.3 03/10/2024   HCT 43.7 03/10/2024   MCV 90.9 03/10/2024   MCH 29.7 03/10/2024   RDW 12.7 03/10/2024   PLT 317 03/10/2024   Last metabolic panel Lab Results  Component Value Date   GLUCOSE 94 03/10/2024   NA 140 03/10/2024   K 3.7 03/10/2024   CL 106 03/10/2024   CO2 24 03/10/2024   BUN 14 03/10/2024   CREATININE 0.81 03/10/2024   GFRNONAA >60 03/10/2024   CALCIUM 10.1 03/10/2024   PROT 7.4 03/10/2024   ALBUMIN 4.0 03/10/2024   BILITOT 0.5 03/10/2024   ALKPHOS 58 03/10/2024   AST 27 03/10/2024   ALT 25 03/10/2024   ANIONGAP 10 03/10/2024   Last lipids Lab Results  Component Value Date   CHOL 225 (H) 06/10/2023   HDL 53 06/10/2023   LDLCALC 130 (H) 06/10/2023   TRIG 212 (H) 06/10/2023   CHOLHDL 4.2 06/10/2023   Last hemoglobin A1c Lab Results  Component Value Date   HGBA1C 5.3 06/10/2023   Last thyroid functions Lab Results  Component Value Date   TSH 1.584 06/10/2023   Last vitamin D Lab Results  Component Value Date   VD25OH 29.88 (L) 06/10/2023   Last vitamin B12 and Folate Lab Results  Component Value Date   VITAMINB12 657 06/10/2023   FOLATE 16.8 06/10/2023   The 10-year ASCVD  risk score (Arnett DK, et al., 2019) is: 13.4%    Assessment & Plan:   Problem List Items Addressed This Visit       Essential hypertension - Primary   Remains adequately controlled on current antihypertensive regimen.  No medication changes are indicated today.      Dysphagia   Recently seen by gastroenterology for follow-up.  She continues to take lansoprazole, which is effective.  GI follow-up is scheduled for July.      Primary malignant neoplasm of breast with metastasis (HCC)   S/p bilateral mastectomy.  Closely followed by oncology. Most recently, she started elacestrant in late February.  Her current symptoms are tolerable.  Oncology follow-up is scheduled for 4/10.      Hyperlipidemia   Lipid panel updated in June 2024.  Total cholesterol 225 and LDL 130.  Her 10-year ASCVD risk is 13.4%.  She prefers to focus on dietary changes in an effort to lower her cholesterol and hopes to avoid statin therapy.  Repeat lipid panel at follow-up in 3 months.      Return in about 3 months (around 06/19/2024).   Billie Lade, MD

## 2024-03-19 NOTE — Assessment & Plan Note (Signed)
 Remains adequately controlled on current antihypertensive regimen.  No medication changes are indicated today.

## 2024-03-19 NOTE — Assessment & Plan Note (Signed)
 S/p bilateral mastectomy.  Closely followed by oncology. Most recently, she started elacestrant in late February.  Her current symptoms are tolerable.  Oncology follow-up is scheduled for 4/10.

## 2024-03-19 NOTE — Assessment & Plan Note (Signed)
 Lipid panel updated in June 2024.  Total cholesterol 225 and LDL 130.  Her 10-year ASCVD risk is 13.4%.  She prefers to focus on dietary changes in an effort to lower her cholesterol and hopes to avoid statin therapy.  Repeat lipid panel at follow-up in 3 months.

## 2024-04-06 ENCOUNTER — Other Ambulatory Visit: Payer: Self-pay | Admitting: *Deleted

## 2024-04-06 DIAGNOSIS — C50919 Malignant neoplasm of unspecified site of unspecified female breast: Secondary | ICD-10-CM

## 2024-04-06 MED ORDER — ELACESTRANT HYDROCHLORIDE 86 MG PO TABS: 258.0000 mg | ORAL_TABLET | Freq: Every day | ORAL | 0 refills | Status: DC

## 2024-04-08 ENCOUNTER — Inpatient Hospital Stay

## 2024-04-08 ENCOUNTER — Inpatient Hospital Stay: Admitting: Hematology

## 2024-04-08 ENCOUNTER — Inpatient Hospital Stay: Attending: Hematology

## 2024-04-08 VITALS — BP 127/81 | HR 80 | Temp 98.1°F | Resp 16 | Wt 178.1 lb

## 2024-04-08 DIAGNOSIS — C7951 Secondary malignant neoplasm of bone: Secondary | ICD-10-CM | POA: Diagnosis not present

## 2024-04-08 DIAGNOSIS — Z170 Estrogen receptor positive status [ER+]: Secondary | ICD-10-CM | POA: Diagnosis not present

## 2024-04-08 DIAGNOSIS — Z9013 Acquired absence of bilateral breasts and nipples: Secondary | ICD-10-CM | POA: Insufficient documentation

## 2024-04-08 DIAGNOSIS — M810 Age-related osteoporosis without current pathological fracture: Secondary | ICD-10-CM

## 2024-04-08 DIAGNOSIS — Z1732 Human epidermal growth factor receptor 2 negative status: Secondary | ICD-10-CM | POA: Diagnosis not present

## 2024-04-08 DIAGNOSIS — Z79811 Long term (current) use of aromatase inhibitors: Secondary | ICD-10-CM | POA: Diagnosis not present

## 2024-04-08 DIAGNOSIS — Z7982 Long term (current) use of aspirin: Secondary | ICD-10-CM | POA: Insufficient documentation

## 2024-04-08 DIAGNOSIS — Z79899 Other long term (current) drug therapy: Secondary | ICD-10-CM | POA: Insufficient documentation

## 2024-04-08 DIAGNOSIS — F17210 Nicotine dependence, cigarettes, uncomplicated: Secondary | ICD-10-CM | POA: Insufficient documentation

## 2024-04-08 DIAGNOSIS — Z1721 Progesterone receptor positive status: Secondary | ICD-10-CM | POA: Insufficient documentation

## 2024-04-08 DIAGNOSIS — C50919 Malignant neoplasm of unspecified site of unspecified female breast: Secondary | ICD-10-CM

## 2024-04-08 DIAGNOSIS — M8580 Other specified disorders of bone density and structure, unspecified site: Secondary | ICD-10-CM | POA: Insufficient documentation

## 2024-04-08 DIAGNOSIS — C50912 Malignant neoplasm of unspecified site of left female breast: Secondary | ICD-10-CM | POA: Insufficient documentation

## 2024-04-08 LAB — CBC WITH DIFFERENTIAL/PLATELET
Abs Immature Granulocytes: 0.03 10*3/uL (ref 0.00–0.07)
Basophils Absolute: 0.1 10*3/uL (ref 0.0–0.1)
Basophils Relative: 1 %
Eosinophils Absolute: 0.1 10*3/uL (ref 0.0–0.5)
Eosinophils Relative: 1 %
HCT: 44.6 % (ref 36.0–46.0)
Hemoglobin: 14.8 g/dL (ref 12.0–15.0)
Immature Granulocytes: 1 %
Lymphocytes Relative: 29 %
Lymphs Abs: 1.6 10*3/uL (ref 0.7–4.0)
MCH: 30.1 pg (ref 26.0–34.0)
MCHC: 33.2 g/dL (ref 30.0–36.0)
MCV: 90.7 fL (ref 80.0–100.0)
Monocytes Absolute: 0.5 10*3/uL (ref 0.1–1.0)
Monocytes Relative: 10 %
Neutro Abs: 3.3 10*3/uL (ref 1.7–7.7)
Neutrophils Relative %: 58 %
Platelets: 329 10*3/uL (ref 150–400)
RBC: 4.92 MIL/uL (ref 3.87–5.11)
RDW: 12.7 % (ref 11.5–15.5)
WBC: 5.6 10*3/uL (ref 4.0–10.5)
nRBC: 0 % (ref 0.0–0.2)

## 2024-04-08 LAB — COMPREHENSIVE METABOLIC PANEL WITH GFR
ALT: 24 U/L (ref 0–44)
AST: 28 U/L (ref 15–41)
Albumin: 3.9 g/dL (ref 3.5–5.0)
Alkaline Phosphatase: 59 U/L (ref 38–126)
Anion gap: 11 (ref 5–15)
BUN: 15 mg/dL (ref 8–23)
CO2: 20 mmol/L — ABNORMAL LOW (ref 22–32)
Calcium: 9.9 mg/dL (ref 8.9–10.3)
Chloride: 108 mmol/L (ref 98–111)
Creatinine, Ser: 0.78 mg/dL (ref 0.44–1.00)
GFR, Estimated: >60 mL/min (ref >60)
Glucose, Bld: 106 mg/dL — ABNORMAL HIGH (ref 70–99)
Potassium: 3.8 mmol/L (ref 3.5–5.1)
Sodium: 139 mmol/L (ref 135–145)
Total Bilirubin: 0.6 mg/dL (ref 0.0–1.2)
Total Protein: 7.3 g/dL (ref 6.5–8.1)

## 2024-04-08 LAB — MAGNESIUM: Magnesium: 1.9 mg/dL (ref 1.7–2.4)

## 2024-04-08 MED ORDER — DENOSUMAB 120 MG/1.7ML ~~LOC~~ SOLN
120.0000 mg | Freq: Once | SUBCUTANEOUS | Status: AC
  Administered 2024-04-08: 120 mg via SUBCUTANEOUS
  Filled 2024-04-08: qty 1.7

## 2024-04-08 NOTE — Progress Notes (Signed)
 Patient presents today for Xgeva injection per providers order.  Vital signs and labs reviewed by MD.  Patient has been taking Calcium and Vitamin D supplements, has had no jaw pain, and no prior or up coming dental work.   Stable during administration without incident; injection site WNL; see MAR for injection details.  Patient tolerated procedure well and without incident.  No questions or complaints noted at this time.

## 2024-04-08 NOTE — Patient Instructions (Signed)
 [facility] [place] - A DEPT OF [facility]  Discharge Instructions: Thank you for choosing [facility] to provide your oncology and hematology care.  If you have a lab appointment with the Cancer Center - please note that after April 8th, 2024, all labs will be drawn in the cancer center.  You do not have to check in or register with the main entrance as you have in the past but will complete your check-in in the cancer center.  Wear comfortable clothing and clothing appropriate for easy access to any Portacath or PICC line.   We strive to give you quality time with your provider. You may need to reschedule your appointment if you arrive late (15 or more minutes).  Arriving late affects you and other patients whose appointments are after yours.  Also, if you miss three or more appointments without notifying the office, you may be dismissed from the clinic at the provider's discretion.      For prescription refill requests, have your pharmacy contact our office and allow 72 hours for refills to be completed.    Today you received the following chemotherapy and/or immunotherapy agents Xgeva      To help prevent nausea and vomiting after your treatment, we encourage you to take your nausea medication as directed.  BELOW ARE SYMPTOMS THAT SHOULD BE REPORTED IMMEDIATELY: *FEVER GREATER THAN 100.4 F (38 C) OR HIGHER *CHILLS OR SWEATING *NAUSEA AND VOMITING THAT IS NOT CONTROLLED WITH YOUR NAUSEA MEDICATION *UNUSUAL SHORTNESS OF BREATH *UNUSUAL BRUISING OR BLEEDING *URINARY PROBLEMS (pain or burning when urinating, or frequent urination) *BOWEL PROBLEMS (unusual diarrhea, constipation, pain near the anus) TENDERNESS IN MOUTH AND THROAT WITH OR WITHOUT PRESENCE OF ULCERS (sore throat, sores in mouth, or a toothache) UNUSUAL RASH, SWELLING OR PAIN  UNUSUAL VAGINAL DISCHARGE OR ITCHING   Items with * indicate a potential emergency and should be followed up as  soon as possible or go to the Emergency Department if any problems should occur.  Please show the CHEMOTHERAPY ALERT CARD or IMMUNOTHERAPY ALERT CARD at check-in to the Emergency Department and triage nurse.  Should you have questions after your visit or need to cancel or reschedule your appointment, please contact [facility] [place] - A DEPT OF Eligha Brideg[facility] [phone]  and follow the prompts.  Office hours are 8:00 a.m. to 4:30 p.m. Monday - Friday. Please note that voicemails left after 4:00 p.m. may not be returned until the following business day.  We are closed weekends and major holidays. You have access to a nurse at all times for urgent questions. Please call the main number to the clinic [phone] and follow the prompts.  For any non-urgent questions, you may also contact your provider using MyChart. We now offer e-Visits for anyone 65 and older to request care online for non-urgent symptoms. For details visit [email or web address].   Also download the MyChart app! Go to the app store, search "MyChart", open the app, select [place], and log in with your MyChart username and password.

## 2024-04-08 NOTE — Progress Notes (Signed)
 [facility] [address], [place] [address]    Clinic Day:  04/08/2024  Referring physician: Billie Lade, MD  Patient Care Team: Billie Lade, MD as PCP - General (Internal Medicine) [name], RN as Oncology Nurse Navigator (Oncology) [name], MD as Medical Oncologist (Medical Oncology)   ASSESSMENT & PLAN:   Assessment: 1.  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 - Started on Ibrance and Faslodex in December 2021 for metastatic disease. - PET scan on 08/10/2021 with no evidence of hypermetabolic metastatic disease.  Widespread scattered sclerotic bone metastasis in the spine and pelvis are stable and not metabolically active.  No lymphadenopathy.  Asymmetric muscular uptake in the left lower scalene muscles and in the intercostal muscles of the ventral upper left chest wall without discrete mass correlate on the CT images, favoring activity related uptake. [name] was dose reduced to 100 mg 3 weeks on/1 week off on 10/11/2021 due to fatigue.  Discontinued on 07/23/2023 due to progression - Ribociclib and exemestane started on 08/07/2023- 10/20/23-- held for rash - PET scan 10/30/2023: Stable exam with persistent increased radiotracer uptake in the sclerotic T9 and T7 vertebral bodies, slightly better compared to the previous scan from July. - She could not tolerate exemestane and ribociclib secondary to hives and rash.  She could not tolerate anastrozole due to hives. - Letrozole 2.5 mg daily started on 01/16/2024, discontinued after 1 dose due to irritation of the throat and tongue. - Guardant360 (01/23/2024): ESR1 D538G - Elacestrant 258 mg daily started on 02/23/2024  2.  Social/family history: - She is a retired  Engineer, structural.  She recently moved back from [place]. - Younger sister had breast cancer in her 29s.  Mother died of leukemia at age 81.  Paternal cousin also had breast cancer.  3.  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  She underwent immediate implants which were later removed on 12/10/2019 secondary to infection and necrosis. - XRT to the chest wall and regional lymph nodes, 60 Gray, from 02/29/2020 through 04/12/2020 - She was treated with adjuvant anastrozole which was started 12/09/2019 and was found to have metastatic disease in November 2021. - Previous genetic testing was reportedly negative and [place].    Plan: 1.  Metastatic breast cancer to the bones, ER/PR positive, HER2 negative: - She started elacestrant to 258 mg daily on 02/23/2024. - She reported improvement in itching.  She has on and off musculoskeletal pains in the back extending up to the knees.  Swelling is also improved.  Nausea and vomiting has also improved when she started taking the elacestrant with food. - I reviewed labs from today: Normal LFTs and creatinine.  CBC grossly normal.  CA 15-3 was 29.7, down from 31.2.  Markers from today is pending. - Continue at the same dose elacestrant 258 mg daily.  RTC 4 weeks for follow-up.  May consider increasing to full dose if she is tolerating well.  2.  Peripheral neuropathy: - Tingling and numbness in the hands and feet along with burning sensation is stable.  Continue Lyrica 25 mg twice daily.  3.  Bone metastatic disease: - Calcium is 9.9.  Continue denosumab today and monthly.  4.  Leg  swellings: - Continue Lasix daily as needed.    No orders of the defined types were placed in this encounter.     [name],acting as a Neurosurgeon for [name], MD.,have documented all relevant documentation on the behalf of [name], MD,as directed by  [name], MD while in the presence of [name], MD.  I, [name] MD, have reviewed the above documentation for accuracy and completeness, and I agree with the above.     [name], MD   4/10/20255:53 PM  CHIEF COMPLAINT:   Diagnosis: metastatic breast cancer    Cancer Staging  No matching staging information was found for the patient.    Prior Therapy: 1. Palbociclib and fulvestrant, 11/2020 - 07/23/23 2. Ribociclib every 4 weeks, 08/07/23 - 10/20/23, held for rash  Current Therapy: Elacestrant   HISTORY OF PRESENT ILLNESS:   Oncology History  Primary malignant neoplasm of breast with metastasis (HCC)  04/28/2019 Genetic Testing   Negative genetic testing on the common hereditary cancer panel through [facility].  The report date is April 28, 2019.  The Common Hereditary Gene Panel offered by [facility] includes sequencing and/or deletion duplication testing of the following 47 genes: APC, ATM, AXIN2, BARD1, BMPR1A, BRCA1, BRCA2, BRIP1, CDH1, CDK4, CDKN2A (p14ARF), CDKN2A (p16INK4a), CHEK2, CTNNA1, DICER1, EPCAM (Deletion/duplication testing only), GREM1 (promoter region deletion/duplication testing only), HOXB13, KIT, MEN1, MLH1, MSH2, MSH3, MSH6, MUTYH, NBN, NF1, NHTL1, PALB2, PDGFRA, PMS2, POLD1, POLE, PTEN, RAD50, RAD51C, RAD51D, SDHB, SDHC, SDHD, SMAD4, SMARCA4. STK11, TP53, TSC1, TSC2, and VHL.  The following genes were evaluated for sequence changes only: SDHA.    07/06/2021 Initial Diagnosis   Metastatic breast cancer (HCC)   07/18/2021 - 08/15/2022 Chemotherapy   Patient is on Treatment Plan : BREAST Palbociclib + Fulvestrant q28d     07/18/2021 - 01/14/2024  Chemotherapy   Patient is on Treatment Plan : BREAST Palbociclib D1-21 + Fulvestrant q28d        INTERVAL HISTORY:   Wendy [facility] is a 63 y.o. female presenting to clinic today for follow up of metastatic breast cancer. She was last seen by me on 03/10/24.  Today, she states that she is doing well overall. Her appetite level is at 50%. Her energy level is at 50-60%.  Filippa reports on and off muscle and joint aches from the shoulders through the lower extremities, as well as tiredness. The muscle and joint pain is not a new onset and has been present since starting Ibrance. There is associated bilateral lower extremity swelling from the knees to the feet when she is standing for long periods of times. She denies any skin rashes or itching, nausea, or vomiting. She is taking lyrica as prescribed and lasix prn.   PAST MEDICAL HISTORY:   Past Medical History: Past Medical History:  Diagnosis Date   Allergy    Asthma    Asthma    diagnosed age 32   Bone metastasis 08/2020   was on prolia   Breast cancer (HCC)    started chemo 05/05/2019. finished 08/2019   Gangrene (HCC)    GERD (gastroesophageal reflux disease)    Hx of bilateral mastectomy 10/22/2019   Hypertension    Hypertension    started bp  meds 2008   Lymphedema of arm    left arm   Osteopenia    Shingles 04/20/2020   left side    Surgical History: Past Surgical History:  Procedure Laterality Date   BIOPSY  01/22/2022   Procedure: BIOPSY;  Surgeon: Marguerita Merles, Reuel Boom, MD;  Location: [facility];  Service: Gastroenterology;;   CARPAL TUNNEL RELEASE Bilateral 2006   ESOPHAGOGASTRODUODENOSCOPY (EGD) WITH PROPOFOL N/A 01/22/2022   Procedure: ESOPHAGOGASTRODUODENOSCOPY (EGD) WITH PROPOFOL;  Surgeon: Dolores Frame, MD;  Location: [facility];  Service: Gastroenterology;  Laterality: N/A;  205   hysteroscopy     age 40   mastectomy Bilateral 10/22/2019   pt had necrosis after 1st surgery, had another surgery to  remove implants 12/10/2019   MOUTH SURGERY     TUBAL LIGATION     TUBAL LIGATION Bilateral 1998   WISDOM TOOTH EXTRACTION     early 1's    Social History: Social History   Socioeconomic History   Marital status: Married    Spouse name: Not on file   Number of children: 2   Years of education: Not on file   Highest education level: GED or equivalent  Occupational History   Occupation: Disability  Tobacco Use   Smoking status: Every Day    Current packs/day: 0.50    Average packs/day: 0.5 packs/day for 40.0 years (20.0 ttl pk-yrs)    Types: Cigarettes   Smokeless tobacco: Never   Tobacco comments:    Smokes half a pack a day for 40 years.   Vaping Use   Vaping status: Never Used  Substance and Sexual Activity   Alcohol use: Never   Drug use: Never   Sexual activity: Yes  Other Topics Concern   Not on file  Social History Narrative   Lives with her husband.    Social Drivers of Health   Financial Resource Strain: Medium Risk (03/19/2024)   Overall Financial Resource Strain (CARDIA)    Difficulty of Paying Living Expenses: Somewhat hard  Food Insecurity: Food Insecurity Present (03/19/2024)   Hunger Vital Sign    Worried About Running Out of Food in the Last Year: Sometimes true    Ran Out of Food in the Last Year: Sometimes true  Transportation Needs: No Transportation Needs (03/19/2024)   PRAPARE - Administrator, Civil Service (Medical): No    Lack of Transportation (Non-Medical): No  Physical Activity: Unknown (03/19/2024)   Exercise Vital Sign    Days of Exercise per Week: Patient declined    Minutes of Exercise per Session: 20 min  Stress: Stress Concern Present (03/19/2024)   [employer] of Occupational Health - Occupational Stress Questionnaire    Feeling of Stress : To some extent  Social Connections: Unknown (03/19/2024)   Social Connection and Isolation Panel [NHANES]    Frequency of Communication with Friends and Family: Twice a week     Frequency of Social Gatherings with Friends and Family: Once a week    Attends Religious Services: Patient declined    Database administrator or Organizations: No    Attends Banker Meetings: Not on file    Marital Status: Married  Intimate Partner Violence: Not At Risk (03/04/2022)   Received from [facility]   Humiliation, Afraid, Rape, and Kick questionnaire    Fear of Current or Ex-Partner: No    Emotionally Abused: No    Physically Abused: No  Sexually Abused: No    Family History: Family History  Problem Relation Age of Onset   Leukemia Mother    Breast cancer Sister    Diabetes Maternal Uncle    Hyperlipidemia Maternal Uncle    Diabetes Mellitus II Maternal Grandmother    Heart disease Maternal Grandfather    Diabetes Maternal Grandfather    Breast cancer Cousin    Breast cancer Half-Sister    Osteoporosis Half-Sister    Lupus Half-Sister    Lupus Half-Sister    Osteoporosis Half-Sister    Cancer - Lung Neg Hx    Colon cancer Neg Hx     Current Medications:  Current Outpatient Medications:    amLODipine (NORVASC) 5 MG tablet, Take 1 tablet by mouth once daily, Disp: 90 tablet, Rfl: 0   Ascorbic Acid (VITAMIN C) 1000 MG tablet, Take 1,000 mg by mouth daily., Disp: , Rfl:    aspirin 81 MG EC tablet, Take 81 mg by mouth daily., Disp: , Rfl:    betamethasone dipropionate 0.05 % cream, Apply topically 2 (two) times daily., Disp: 30 g, Rfl: 1   Calcium-Magnesium-Vitamin D (CALCIUM 1200+D3 PO), Take 1 tablet by mouth daily., Disp: , Rfl:    cetirizine-pseudoephedrine (ZYRTEC-D) 5-120 MG tablet, Take 1 tablet by mouth daily., Disp: , Rfl:    CINNAMON PO, Take 1,000 mg by mouth daily., Disp: , Rfl:    cyanocobalamin 1000 MCG tablet, Take 1 tablet (1,000 mcg total) by mouth daily. (Patient taking differently: Take 1,000 mcg by mouth every other day.), Disp: 30 tablet, Rfl: 6   cyclobenzaprine (FLEXERIL) 10  MG tablet, Take 1 tablet (10 mg total) by mouth 2 (two) times daily as needed for muscle spasms., Disp: 60 tablet, Rfl: 5   denosumab (XGEVA) 120 MG/1.7ML SOLN injection, Inject 120 mg into the skin every 30 (thirty) days., Disp: , Rfl:    dexamethasone 0.5 mg/5 mL - diphenhydrAMINE 12.5 mg/5 mL oral solution 2:1 mixture, Take 10 mLs by mouth 2 (two) times daily., Disp: 480 mL, Rfl: 2   elacestrant hydrochloride (ORSERDU) 86 MG tablet, Take 3 tablets (258 mg total) by mouth daily. Take with food., Disp: 90 tablet, Rfl: 0   furosemide (LASIX) 20 MG tablet, Take 1 tablet (20 mg total) by mouth daily as needed for edema (Take daily as needed for edema)., Disp: 30 tablet, Rfl: 2   HYDROcodone-acetaminophen (NORCO) 5-325 MG tablet, Take 1 tablet by mouth every 12 (twelve) hours as needed for moderate pain., Disp: 30 tablet, Rfl: 0   lansoprazole (PREVACID) 30 MG capsule, TAKE 1 CAPSULE BY MOUTH IN THE MORNING AND AT BEDTIME, Disp: 120 capsule, Rfl: 3   Misc. Devices MISC, Please provide patient with left arm compression sleeve. Diagnosis metastatic breast cancer, bilateral mastectomy, left arm lymphedema, Disp: 1 each, Rfl: 0   NAPHCON-A 0.025-0.3 % ophthalmic solution, Place 2 drops into both eyes in the morning and at bedtime., Disp: , Rfl:    pregabalin (LYRICA) 25 MG capsule, Take 1 capsule by mouth twice daily, Disp: 60 capsule, Rfl: 3   prochlorperazine (COMPAZINE) 10 MG tablet, Take 1 tablet (10 mg total) by mouth every 6 (six) hours as needed for nausea or vomiting., Disp: 60 tablet, Rfl: 2   Allergies: Allergies  Allergen Reactions   Augmentin [Amoxicillin-Pot Clavulanate] Anaphylaxis   Ciprofloxacin Anaphylaxis   Claritin [Loratadine] Anaphylaxis   Gabapentin Anaphylaxis   Keflex [Cephalexin] Anaphylaxis   Levofloxacin Anaphylaxis   Lisinopril Swelling   Lisinopril Anaphylaxis   Metoprolol  Swelling   Metoprolol Anaphylaxis   Nexium [Esomeprazole] Anaphylaxis   Sulfa Antibiotics Hives  and Swelling   Tetracyclines & Related Anaphylaxis   Iohexol Hives    Needs pre meds   Ultram [Tramadol] Other (See Comments)    Patient states medication irritates throat    Anastrozole Hives   Ceftin [Cefuroxime Axetil] Hives   Chromium Other (See Comments)    headache   Exemestane Hives   Iodinated Contrast Media Hives   Kisqali (200 Mg Dose) [Ribociclib Succ (200 Mg Dose)] Hives   Misc. Sulfonamide Containing Compounds Other (See Comments)   Oxycodone Itching   Sulfa Antibiotics     Possible Reaction    Latex Rash    itching   Nickel Rash    itching    REVIEW OF SYSTEMS:   Review of Systems  Constitutional:  Negative for chills, fatigue and fever.  HENT:   Negative for lump/mass, mouth sores, nosebleeds, sore throat and trouble swallowing.   Eyes:  Negative for eye problems.  Respiratory:  Positive for cough and shortness of breath.   Cardiovascular:  Negative for chest pain, leg swelling and palpitations.  Gastrointestinal:  Positive for constipation. Negative for abdominal pain, diarrhea, nausea and vomiting.  Genitourinary:  Negative for bladder incontinence, difficulty urinating, dysuria, frequency, hematuria and nocturia.   Musculoskeletal:  Positive for arthralgias (throughout the body, 6/10 severity) and back pain (6/10 severity). Negative for flank pain, myalgias and neck pain.       +bilateral hip pain, 6/10 severity  Skin:  Negative for itching and rash.  Neurological:  Positive for dizziness (occasional), headaches and numbness (in hands and feet).  Hematological:  Does not bruise/bleed easily.  Psychiatric/Behavioral:  Positive for sleep disturbance. Negative for depression and suicidal ideas. The patient is not nervous/anxious.   All other systems reviewed and are negative.    VITALS:   Blood pressure 127/81, pulse 80, temperature 98.1 F (36.7 C), temperature source Oral, resp. rate 16, weight 178 lb 2.1 oz (80.8 kg), last menstrual period 06/08/2012,  SpO2 96%.  Wt Readings from Last 3 Encounters:  04/08/24 178 lb 2.1 oz (80.8 kg)  03/19/24 178 lb 9.6 oz (81 kg)  03/10/24 179 lb 0.2 oz (81.2 kg)    Body mass index is 30.1 kg/m.  Performance status (ECOG): 1 - Symptomatic but completely ambulatory  PHYSICAL EXAM:   Physical Exam Vitals and nursing note reviewed. Exam conducted with a chaperone present.  Constitutional:      Appearance: Normal appearance.  Cardiovascular:     Rate and Rhythm: Normal rate and regular rhythm.     Pulses: Normal pulses.     Heart sounds: Normal heart sounds.  Pulmonary:     Effort: Pulmonary effort is normal.     Breath sounds: Normal breath sounds.  Abdominal:     Palpations: Abdomen is soft. There is no hepatomegaly, splenomegaly or mass.     Tenderness: There is no abdominal tenderness.  Musculoskeletal:     Right lower leg: No edema.     Left lower leg: No edema.  Lymphadenopathy:     Cervical: No cervical adenopathy.     Right cervical: No superficial, deep or posterior cervical adenopathy.    Left cervical: No superficial, deep or posterior cervical adenopathy.     Upper Body:     Right upper body: No supraclavicular or axillary adenopathy.     Left upper body: No supraclavicular or axillary adenopathy.  Neurological:     General:  No focal deficit present.     Mental Status: She is alert and oriented to person, place, and time.  Psychiatric:        Mood and Affect: Mood normal.        Behavior: Behavior normal.     LABS:   CBC     Component Value Date/Time   WBC 5.6 04/08/2024 0940   RBC 4.92 04/08/2024 0940   HGB 14.8 04/08/2024 0940   HCT 44.6 04/08/2024 0940   PLT 329 04/08/2024 0940   MCV 90.7 04/08/2024 0940   MCH 30.1 04/08/2024 0940   MCHC 33.2 04/08/2024 0940   RDW 12.7 04/08/2024 0940   LYMPHSABS 1.6 04/08/2024 0940   MONOABS 0.5 04/08/2024 0940   EOSABS 0.1 04/08/2024 0940   BASOSABS 0.1 04/08/2024 0940    CMP      Component Value Date/Time   NA 139  04/08/2024 0940   K 3.8 04/08/2024 0940   CL 108 04/08/2024 0940   CO2 20 (L) 04/08/2024 0940   GLUCOSE 106 (H) 04/08/2024 0940   BUN 15 04/08/2024 0940   CREATININE 0.78 04/08/2024 0940   CALCIUM 9.9 04/08/2024 0940   PROT 7.3 04/08/2024 0940   ALBUMIN 3.9 04/08/2024 0940   AST 28 04/08/2024 0940   ALT 24 04/08/2024 0940   ALKPHOS 59 04/08/2024 0940   BILITOT 0.6 04/08/2024 0940   GFRNONAA >60 04/08/2024 0940   GFRAA >90 06/08/2012 1251     No results found for: "CEA1", "CEA" / No results found for: "CEA1", "CEA" No results found for: "PSA1" No results found for: "ZOX096" No results found for: "CAN125"  No results found for: "TOTALPROTELP", "ALBUMINELP", "A1GS", "A2GS", "BETS", "BETA2SER", "GAMS", "MSPIKE", "SPEI" No results found for: "TIBC", "FERRITIN", "IRONPCTSAT" No results found for: "LDH"   STUDIES:   No results found.

## 2024-04-08 NOTE — Progress Notes (Signed)
 Patient is taking Orsurdu as prescribed. She has not missed any doses and reports no side effects at this time.

## 2024-04-08 NOTE — Patient Instructions (Addendum)
 [facility] at [facility] Discharge Instructions   You were seen and examined today by Dr. Ellin Saba.  He reviewed the results of your lab work which are normal/stable.   We will proceed with your Xgeva today.   Return as scheduled.    Thank you for choosing [facility] at [facility] to provide your oncology and hematology care.  To afford each patient quality time with our provider, please arrive at least 15 minutes before your scheduled appointment time.   If you have a lab appointment with the Cancer Center please come in thru the Main Entrance and check in at the main information desk.  You need to re-schedule your appointment should you arrive 10 or more minutes late.  We strive to give you quality time with our providers, and arriving late affects you and other patients whose appointments are after yours.  Also, if you no show three or more times for appointments you may be dismissed from the clinic at the providers discretion.     Again, thank you for choosing [facility].  Our hope is that these requests will decrease the amount of time that you wait before being seen by our physicians.       _____________________________________________________________  Should you have questions after your visit to [facility], please contact our office at [phone] and follow the prompts.  Our office hours are 8:00 a.m. and 4:30 p.m. Monday - Friday.  Please note that voicemails left after 4:00 p.m. may not be returned until the following business day.  We are closed weekends and major holidays.  You do have access to a nurse 24-7, just call the main number to the clinic [phone] and do not press any options, hold on the line and a nurse will answer the phone.    For prescription refill requests, have your pharmacy contact our office and allow 72 hours.    Due to Covid, you will need to wear a mask upon entering the  hospital. If you do not have a mask, a mask will be given to you at the Main Entrance upon arrival. For doctor visits, patients may have 1 support person age 52 or older with them. For treatment visits, patients can not have anyone with them due to social distancing guidelines and our immunocompromised population.

## 2024-04-12 ENCOUNTER — Other Ambulatory Visit: Payer: Self-pay | Admitting: *Deleted

## 2024-04-12 DIAGNOSIS — C50919 Malignant neoplasm of unspecified site of unspecified female breast: Secondary | ICD-10-CM

## 2024-04-13 LAB — CANCER ANTIGEN 15-3: CA 15-3: 38.6 U/mL — ABNORMAL HIGH (ref 0.0–25.0)

## 2024-05-03 ENCOUNTER — Other Ambulatory Visit: Payer: Self-pay | Admitting: *Deleted

## 2024-05-03 DIAGNOSIS — C50919 Malignant neoplasm of unspecified site of unspecified female breast: Secondary | ICD-10-CM

## 2024-05-03 MED ORDER — ELACESTRANT HYDROCHLORIDE 86 MG PO TABS: 258.0000 mg | ORAL_TABLET | Freq: Every day | ORAL | 0 refills | Status: DC

## 2024-05-03 NOTE — Telephone Encounter (Signed)
 Rx approved for Orserdu , per last note, patient tolerating and is to continue on this dose until follow up.

## 2024-05-05 ENCOUNTER — Other Ambulatory Visit: Payer: Self-pay | Admitting: Internal Medicine

## 2024-05-05 NOTE — Progress Notes (Signed)
 [facility] [address] [place] [address]    Clinic Day:  05/06/2024  Referring physician: Tobi Fortes, MD  Patient Care Team: Tobi Fortes, MD as PCP - General (Internal Medicine) Gerhard Knuckles, RN as Oncology Nurse Navigator (Oncology) Paulett Boros, MD as Medical Oncologist (Medical Oncology)   ASSESSMENT & PLAN:   Assessment: 1.  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 - Started on Ibrance  and Faslodex  in December 2021 for metastatic disease. - PET scan on 08/10/2021 with no evidence of hypermetabolic metastatic disease.  Widespread scattered sclerotic bone metastasis in the spine and pelvis are stable and not metabolically active.  No lymphadenopathy.  Asymmetric muscular uptake in the left lower scalene muscles and in the intercostal muscles of the ventral upper left chest wall without discrete mass correlate on the CT images, favoring activity related uptake. - Ibrance  was dose reduced to 100 mg 3 weeks on/1 week off on 10/11/2021 due to fatigue.  Discontinued on 07/23/2023 due to progression - Ribociclib  and exemestane  started on 08/07/2023- 10/20/23-- held for rash - PET scan 10/30/2023: Stable exam with persistent increased radiotracer uptake in the sclerotic T9 and T7 vertebral bodies, slightly better compared to the previous scan from July. - She could not tolerate exemestane  and ribociclib  secondary to hives and rash.  She could not tolerate anastrozole  due to hives. - Letrozole  2.5 mg daily started on 01/16/2024, discontinued after 1 dose due to irritation of the throat and tongue. - Guardant360 (01/23/2024): ESR1 D538G - Elacestrant  258 mg daily started on 02/23/2024  2.  Social/family history: - She is a retired  Engineer, structural.  She recently moved back from [place]. - Younger sister had breast cancer in her 47s.  Mother died of leukemia at age 43.  Paternal cousin also had breast cancer.  3.  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  She underwent immediate implants which were later removed on 12/10/2019 secondary to infection and necrosis. - XRT to the chest wall and regional lymph nodes, 60 Gray, from 02/29/2020 through 04/12/2020 - She was treated with adjuvant anastrozole  which was started 12/09/2019 and was found to have metastatic disease in November 2021. - Previous genetic testing was reportedly negative and [place].    Plan: 1.  Metastatic breast cancer to the bones, ER/PR positive, HER2 negative: - She was started on elacestrant  258 mg daily on 02/23/2024. - She has on and off pain which radiates from the right hip to the right knee.  She has generalized mild itching which she is slightly stable to improved since last visit.  Swelling in her extremities also improved.  Nausea and vomiting is well-controlled when she started taking elacestrant  with food. - Today labs indicate normal LFTs and CBC.  Tumor markers are pending.  Last CA 15-3 was 38.6 on 04/08/2024. - She has slight erythematous rash on the left lobe of the ear.  She also complained of some tenderness at the xiphoid  sternum area.  No mass palpable. - We talked about increasing the dose of elacestrant .  She is reluctant to do it.  She will continue elacestrant  258 mg daily.  I will see her back in 6 weeks for follow-up with repeat tumor markers.  I will also  repeat a PET scan prior to next visit.  2.  Peripheral neuropathy: - Tingling and numbness in the hands and feet along with burning sensation is stable. - Continue Lyrica  25 mg twice daily.  She is taking as needed.  3.  Bone metastatic disease: - Calcium today slightly high at 10.4.  She is taking calcium 1200+ vitamin D .  She may proceed with denosumab  today and monthly.  4.  Leg swellings: - She has 1+ edema.  Continue Lasix  daily as needed.    Orders Placed This Encounter  Procedures   NM PET Image Restage (PS) Skull Base to Thigh (F-18 FDG)    Standing Status:   Future    Expected Date:   06/06/2024    Expiration Date:   05/06/2025    If indicated for the ordered procedure, I authorize the administration of a radiopharmaceutical per Radiology protocol:   Yes    Preferred imaging location?:   [facility]   CBC with Differential    Standing Status:   Future    Expected Date:   06/10/2024    Expiration Date:   05/06/2025   Comprehensive metabolic panel    Standing Status:   Future    Expected Date:   06/10/2024    Expiration Date:   05/06/2025   Cancer antigen 27.29    Standing Status:   Future    Expected Date:   06/10/2024    Expiration Date:   05/06/2025   Cancer antigen 15-3    Standing Status:   Future    Expected Date:   06/10/2024    Expiration Date:   05/06/2025   [facility] COMMUNICATION    Schedule 1 hour injection appointment   Treatment conditions    Provider to add parameters.    Standing Status:   Standing    Number of Occurrences:   1    Did you provide treatment parameters in the comments section?:   No      I,Helena R Teague,acting as a scribe for Paulett Boros, MD.,have documented all relevant documentation on the behalf of Paulett Boros, MD,as directed by  Paulett Boros, MD while in the presence of Paulett Boros, MD.  I, Paulett Boros MD, have reviewed the above documentation for accuracy and completeness, and I agree with the  above.      Paulett Boros, MD   5/8/202512:57 PM  CHIEF COMPLAINT:   Diagnosis: metastatic breast cancer    Cancer Staging  No matching staging information was found for the patient.    Prior Therapy: 1. Palbociclib  and fulvestrant , 11/2020 - 07/23/23 2. Ribociclib  every 4 weeks, 08/07/23 - 10/20/23, held for rash  Current Therapy: Elacestrant    HISTORY OF PRESENT ILLNESS:   Oncology History  Primary malignant neoplasm of breast with metastasis (HCC)  04/28/2019 Genetic Testing   Negative genetic testing on the common hereditary cancer panel through [facility].  The report date is April 28, 2019.  The Common Hereditary Gene Panel offered by [facility] includes sequencing and/or deletion duplication testing of the following 47 genes: APC, ATM, AXIN2, BARD1, BMPR1A, BRCA1, BRCA2, BRIP1, CDH1, CDK4, CDKN2A (p14ARF), CDKN2A (p16INK4a), CHEK2, CTNNA1, DICER1, EPCAM (Deletion/duplication testing only), GREM1 (promoter region deletion/duplication testing  only), HOXB13, KIT, MEN1, MLH1, MSH2, MSH3, MSH6, MUTYH, NBN, NF1, NHTL1, PALB2, PDGFRA, PMS2, POLD1, POLE, PTEN, RAD50, RAD51C, RAD51D, SDHB, SDHC, SDHD, SMAD4, SMARCA4. STK11, TP53, TSC1, TSC2, and VHL.  The following genes were evaluated for sequence changes only: SDHA.    07/06/2021 Initial Diagnosis   Metastatic breast cancer (HCC)   07/18/2021 - 08/15/2022 Chemotherapy   Patient is on Treatment Plan : BREAST Palbociclib  + Fulvestrant  q28d     07/18/2021 - 01/14/2024 Chemotherapy   Patient is on Treatment Plan : BREAST Palbociclib  D1-21 + Fulvestrant  q28d        INTERVAL HISTORY:   Wendy Zamora is a 63 y.o. female presenting to clinic today for follow up of metastatic breast cancer. She was last seen by me on 04/08/24.  Today, she states that she is doing well overall. Her appetite level is at 50%. Her energy level is at 50%.  PAST MEDICAL HISTORY:   Past Medical History: Past Medical History:  Diagnosis Date   Allergy    Asthma     Asthma    diagnosed age 60   Bone metastasis 08/2020   was on prolia    Breast cancer (HCC)    started chemo 05/05/2019. finished 08/2019   Gangrene (HCC)    GERD (gastroesophageal reflux disease)    Hx of bilateral mastectomy 10/22/2019   Hypertension    Hypertension    started bp meds 2008   Lymphedema of arm    left arm   Osteopenia    Shingles 04/20/2020   left side    Surgical History: Past Surgical History:  Procedure Laterality Date   BIOPSY  01/22/2022   Procedure: BIOPSY;  Surgeon: Urban Garden, MD;  Location: [facility];  Service: Gastroenterology;;   Ritta Chessman RELEASE Bilateral 2006   ESOPHAGOGASTRODUODENOSCOPY (EGD) WITH PROPOFOL  N/A 01/22/2022   Procedure: ESOPHAGOGASTRODUODENOSCOPY (EGD) WITH PROPOFOL ;  Surgeon: Urban Garden, MD;  Location: [facility];  Service: Gastroenterology;  Laterality: N/A;  205   hysteroscopy     age 89   mastectomy Bilateral 10/22/2019   pt had necrosis after 1st surgery, had another surgery to remove implants 12/10/2019   MOUTH SURGERY     TUBAL LIGATION     TUBAL LIGATION Bilateral 1998   WISDOM TOOTH EXTRACTION     early 60's    Social History: Social History   Socioeconomic History   Marital status: Married    Spouse name: Not on file   Number of children: 2   Years of education: Not on file   Highest education level: GED or equivalent  Occupational History   Occupation: Disability  Tobacco Use   Smoking status: Every Day    Current packs/day: 0.50    Average packs/day: 0.5 packs/day for 40.0 years (20.0 ttl pk-yrs)    Types: Cigarettes   Smokeless tobacco: Never   Tobacco comments:    Smokes half a pack a day for 40 years.   Vaping Use   Vaping status: Never Used  Substance and Sexual Activity   Alcohol use: Never   Drug use: Never   Sexual activity: Yes  Other Topics Concern   Not on file  Social History Narrative   Lives with her husband.    Social Drivers of Health    Financial Resource Strain: Medium Risk (03/19/2024)   Overall Financial Resource Strain (CARDIA)    Difficulty of Paying Living Expenses: Somewhat hard  Food Insecurity: Food Insecurity Present (03/19/2024)   Hunger Vital Sign  Worried About Programme researcher, broadcasting/film/video in the Last Year: Sometimes true    [employer] of Food in the Last Year: Sometimes true  Transportation Needs: No Transportation Needs (03/19/2024)   PRAPARE - Administrator, Civil Service (Medical): No    Lack of Transportation (Non-Medical): No  Physical Activity: Unknown (03/19/2024)   Exercise Vital Sign    Days of Exercise per Week: Patient declined    Minutes of Exercise per Session: 20 min  Stress: Stress Concern Present (03/19/2024)   [employer] of Occupational Health - Occupational Stress Questionnaire    Feeling of Stress : To some extent  Social Connections: Unknown (03/19/2024)   Social Connection and Isolation Panel [NHANES]    Frequency of Communication with Friends and Family: Twice a week    Frequency of Social Gatherings with Friends and Family: Once a week    Attends Religious Services: Patient declined    Database administrator or Organizations: No    Attends Banker Meetings: Not on file    Marital Status: Married  Intimate Partner Violence: Not At Risk (03/04/2022)   Received from [facility]   Humiliation, Afraid, Rape, and Kick questionnaire    Fear of Current or Ex-Partner: No    Emotionally Abused: No    Physically Abused: No    Sexually Abused: No    Family History: Family History  Problem Relation Age of Onset   Leukemia Mother    Breast cancer Sister    Diabetes Maternal Uncle    Hyperlipidemia Maternal Uncle    Diabetes Mellitus II Maternal Grandmother    Heart disease Maternal Grandfather    Diabetes Maternal Grandfather    Breast cancer Cousin    Breast cancer Half-Sister    Osteoporosis Half-Sister     Lupus Half-Sister    Lupus Half-Sister    Osteoporosis Half-Sister    Cancer - Lung Neg Hx    Colon cancer Neg Hx     Current Medications:  Current Outpatient Medications:    amLODipine  (NORVASC ) 5 MG tablet, Take 1 tablet by mouth once daily, Disp: 90 tablet, Rfl: 0   Ascorbic Acid (VITAMIN C) 1000 MG tablet, Take 1,000 mg by mouth daily., Disp: , Rfl:    aspirin  81 MG EC tablet, Take 81 mg by mouth daily., Disp: , Rfl:    betamethasone  dipropionate 0.05 % cream, Apply topically 2 (two) times daily., Disp: 30 g, Rfl: 1   Calcium-Magnesium-Vitamin D  (CALCIUM 1200+D3 PO), Take 1 tablet by mouth daily., Disp: , Rfl:    cetirizine-pseudoephedrine (ZYRTEC-D) 5-120 MG tablet, Take 1 tablet by mouth daily., Disp: , Rfl:    CINNAMON PO, Take 1,000 mg by mouth daily., Disp: , Rfl:    cyanocobalamin  1000 MCG tablet, Take 1 tablet (1,000 mcg total) by mouth daily. (Patient taking differently: Take 1,000 mcg by mouth every other day.), Disp: 30 tablet, Rfl: 6   cyclobenzaprine  (FLEXERIL ) 10 MG tablet, Take 1 tablet (10 mg total) by mouth 2 (two) times daily as needed for muscle spasms., Disp: 60 tablet, Rfl: 5   denosumab  (XGEVA ) 120 MG/1.7ML SOLN injection, Inject 120 mg into the skin every 30 (thirty) days., Disp: , Rfl:    dexamethasone  0.5 mg/5 mL - diphenhydrAMINE  12.5 mg/5 mL oral solution 2:1 mixture, Take 10 mLs by mouth 2 (two) times daily., Disp: 480 mL, Rfl: 2   elacestrant  hydrochloride (ORSERDU ) 86 MG tablet, Take 3 tablets (  258 mg total) by mouth daily. Take with food., Disp: 90 tablet, Rfl: 0   furosemide  (LASIX ) 20 MG tablet, Take 1 tablet (20 mg total) by mouth daily as needed for edema (Take daily as needed for edema)., Disp: 30 tablet, Rfl: 2   HYDROcodone -acetaminophen  (NORCO) 5-325 MG tablet, Take 1 tablet by mouth every 12 (twelve) hours as needed for moderate pain., Disp: 30 tablet, Rfl: 0   lansoprazole  (PREVACID ) 30 MG capsule, TAKE 1 CAPSULE BY MOUTH IN THE MORNING AND AT  BEDTIME, Disp: 120 capsule, Rfl: 3   Misc. Devices MISC, Please provide patient with left arm compression sleeve. Diagnosis metastatic breast cancer, bilateral mastectomy, left arm lymphedema, Disp: 1 each, Rfl: 0   NAPHCON-A 0.025-0.3 % ophthalmic solution, Place 2 drops into both eyes in the morning and at bedtime., Disp: , Rfl:    pregabalin  (LYRICA ) 25 MG capsule, Take 1 capsule by mouth twice daily, Disp: 60 capsule, Rfl: 3   prochlorperazine  (COMPAZINE ) 10 MG tablet, Take 1 tablet (10 mg total) by mouth every 6 (six) hours as needed for nausea or vomiting., Disp: 60 tablet, Rfl: 2   Allergies: Allergies  Allergen Reactions   Augmentin [Amoxicillin-Pot Clavulanate] Anaphylaxis   Ciprofloxacin Anaphylaxis   Claritin [Loratadine] Anaphylaxis   Gabapentin Anaphylaxis   Keflex [Cephalexin] Anaphylaxis   Levofloxacin Anaphylaxis   Lisinopril Swelling   Lisinopril Anaphylaxis   Metoprolol Swelling   Metoprolol Anaphylaxis   Nexium [Esomeprazole] Anaphylaxis   Sulfa Antibiotics Hives and Swelling   Tetracyclines & Related Anaphylaxis   Iohexol  Hives    Needs pre meds   Ultram  [Tramadol ] Other (See Comments)    Patient states medication irritates throat    Anastrozole  Hives   Ceftin [Cefuroxime Axetil] Hives   Chromium Other (See Comments)    headache   Exemestane  Hives   Iodinated Contrast Media Hives   Kisqali  (200 Mg Dose) [Ribociclib  Succ (200 Mg Dose)] Hives   Misc. Sulfonamide Containing Compounds Other (See Comments)   Oxycodone Itching   Sulfa Antibiotics     Possible Reaction    Latex Rash    itching   Nickel Rash    itching    REVIEW OF SYSTEMS:   Review of Systems  Constitutional:  Negative for chills, fatigue and fever.  HENT:   Negative for lump/mass, mouth sores, nosebleeds, sore throat and trouble swallowing.   Eyes:  Negative for eye problems.  Respiratory:  Positive for cough and shortness of breath.   Cardiovascular:  Positive for leg swelling.  Negative for chest pain and palpitations.  Gastrointestinal:  Positive for constipation. Negative for abdominal pain, diarrhea, nausea and vomiting.  Genitourinary:  Negative for bladder incontinence, difficulty urinating, dysuria, frequency, hematuria and nocturia.   Musculoskeletal:  Negative for arthralgias, back pain, flank pain, myalgias and neck pain.  Skin:  Negative for itching and rash.  Neurological:  Positive for numbness. Negative for dizziness and headaches.  Hematological:  Does not bruise/bleed easily.  Psychiatric/Behavioral:  Positive for sleep disturbance. Negative for depression and suicidal ideas. The patient is not nervous/anxious.   All other systems reviewed and are negative.    VITALS:   Blood pressure 136/79, pulse 76, temperature (!) 97.1 F (36.2 C), temperature source Tympanic, resp. rate 18, height 5' 4.5" (1.638 m), weight 179 lb (81.2 kg), last menstrual period 06/08/2012, SpO2 100%.  Wt Readings from Last 3 Encounters:  05/06/24 179 lb (81.2 kg)  04/08/24 178 lb 2.1 oz (80.8 kg)  03/19/24 178 lb 9.6  oz (81 kg)    Body mass index is 30.25 kg/m.  Performance status (ECOG): 1 - Symptomatic but completely ambulatory  PHYSICAL EXAM:   Physical Exam Vitals and nursing note reviewed. Exam conducted with a chaperone present.  Constitutional:      Appearance: Normal appearance.  Cardiovascular:     Rate and Rhythm: Normal rate and regular rhythm.     Pulses: Normal pulses.     Heart sounds: Normal heart sounds.  Pulmonary:     Effort: Pulmonary effort is normal.     Breath sounds: Normal breath sounds.  Abdominal:     Palpations: Abdomen is soft. There is no hepatomegaly, splenomegaly or mass.     Tenderness: There is no abdominal tenderness.  Musculoskeletal:     Right lower leg: Edema present.     Left lower leg: Edema present.  Lymphadenopathy:     Cervical: No cervical adenopathy.     Right cervical: No superficial, deep or posterior cervical  adenopathy.    Left cervical: No superficial, deep or posterior cervical adenopathy.     Upper Body:     Right upper body: No supraclavicular or axillary adenopathy.     Left upper body: No supraclavicular or axillary adenopathy.  Neurological:     General: No focal deficit present.     Mental Status: She is alert and oriented to person, place, and time.  Psychiatric:        Mood and Affect: Mood normal.        Behavior: Behavior normal.     LABS:   CBC     Component Value Date/Time   WBC 5.7 05/06/2024 1033   RBC 4.71 05/06/2024 1033   HGB 14.6 05/06/2024 1033   HCT 42.5 05/06/2024 1033   PLT 342 05/06/2024 1033   MCV 90.2 05/06/2024 1033   MCH 31.0 05/06/2024 1033   MCHC 34.4 05/06/2024 1033   RDW 12.7 05/06/2024 1033   LYMPHSABS 1.8 05/06/2024 1033   MONOABS 0.6 05/06/2024 1033   EOSABS 0.1 05/06/2024 1033   BASOSABS 0.1 05/06/2024 1033    CMP      Component Value Date/Time   NA 138 05/06/2024 1033   K 4.0 05/06/2024 1033   CL 105 05/06/2024 1033   CO2 22 05/06/2024 1033   GLUCOSE 96 05/06/2024 1033   BUN 14 05/06/2024 1033   CREATININE 0.71 05/06/2024 1033   CALCIUM 10.4 (H) 05/06/2024 1033   PROT 7.2 05/06/2024 1033   ALBUMIN 4.0 05/06/2024 1033   AST 28 05/06/2024 1033   ALT 28 05/06/2024 1033   ALKPHOS 64 05/06/2024 1033   BILITOT 0.6 05/06/2024 1033   GFRNONAA >60 05/06/2024 1033   GFRAA >90 06/08/2012 1251     No results found for: "CEA1", "CEA" / No results found for: "CEA1", "CEA" No results found for: "PSA1" No results found for: "ZHY865" No results found for: "CAN125"  No results found for: "TOTALPROTELP", "ALBUMINELP", "A1GS", "A2GS", "BETS", "BETA2SER", "GAMS", "MSPIKE", "SPEI" No results found for: "TIBC", "FERRITIN", "IRONPCTSAT" No results found for: "LDH"   STUDIES:   No results found.

## 2024-05-06 ENCOUNTER — Inpatient Hospital Stay: Attending: Hematology

## 2024-05-06 ENCOUNTER — Inpatient Hospital Stay

## 2024-05-06 ENCOUNTER — Inpatient Hospital Stay: Admitting: Hematology

## 2024-05-06 VITALS — BP 136/79 | HR 76 | Temp 97.1°F | Resp 18 | Ht 64.5 in | Wt 179.0 lb

## 2024-05-06 DIAGNOSIS — Z1721 Progesterone receptor positive status: Secondary | ICD-10-CM | POA: Diagnosis not present

## 2024-05-06 DIAGNOSIS — F17210 Nicotine dependence, cigarettes, uncomplicated: Secondary | ICD-10-CM | POA: Insufficient documentation

## 2024-05-06 DIAGNOSIS — C50919 Malignant neoplasm of unspecified site of unspecified female breast: Secondary | ICD-10-CM | POA: Diagnosis not present

## 2024-05-06 DIAGNOSIS — Z1732 Human epidermal growth factor receptor 2 negative status: Secondary | ICD-10-CM | POA: Diagnosis not present

## 2024-05-06 DIAGNOSIS — C7951 Secondary malignant neoplasm of bone: Secondary | ICD-10-CM | POA: Insufficient documentation

## 2024-05-06 DIAGNOSIS — C50912 Malignant neoplasm of unspecified site of left female breast: Secondary | ICD-10-CM | POA: Diagnosis not present

## 2024-05-06 DIAGNOSIS — Z9013 Acquired absence of bilateral breasts and nipples: Secondary | ICD-10-CM | POA: Diagnosis not present

## 2024-05-06 DIAGNOSIS — M810 Age-related osteoporosis without current pathological fracture: Secondary | ICD-10-CM

## 2024-05-06 DIAGNOSIS — Z170 Estrogen receptor positive status [ER+]: Secondary | ICD-10-CM | POA: Diagnosis not present

## 2024-05-06 DIAGNOSIS — Z7982 Long term (current) use of aspirin: Secondary | ICD-10-CM | POA: Insufficient documentation

## 2024-05-06 DIAGNOSIS — M8580 Other specified disorders of bone density and structure, unspecified site: Secondary | ICD-10-CM | POA: Insufficient documentation

## 2024-05-06 DIAGNOSIS — Z79899 Other long term (current) drug therapy: Secondary | ICD-10-CM | POA: Diagnosis not present

## 2024-05-06 DIAGNOSIS — Z79811 Long term (current) use of aromatase inhibitors: Secondary | ICD-10-CM | POA: Insufficient documentation

## 2024-05-06 LAB — CBC WITH DIFFERENTIAL/PLATELET
Abs Immature Granulocytes: 0.03 10*3/uL (ref 0.00–0.07)
Basophils Absolute: 0.1 10*3/uL (ref 0.0–0.1)
Basophils Relative: 1 %
Eosinophils Absolute: 0.1 10*3/uL (ref 0.0–0.5)
Eosinophils Relative: 1 %
HCT: 42.5 % (ref 36.0–46.0)
Hemoglobin: 14.6 g/dL (ref 12.0–15.0)
Immature Granulocytes: 1 %
Lymphocytes Relative: 31 %
Lymphs Abs: 1.8 10*3/uL (ref 0.7–4.0)
MCH: 31 pg (ref 26.0–34.0)
MCHC: 34.4 g/dL (ref 30.0–36.0)
MCV: 90.2 fL (ref 80.0–100.0)
Monocytes Absolute: 0.6 10*3/uL (ref 0.1–1.0)
Monocytes Relative: 10 %
Neutro Abs: 3.2 10*3/uL (ref 1.7–7.7)
Neutrophils Relative %: 56 %
Platelets: 342 10*3/uL (ref 150–400)
RBC: 4.71 MIL/uL (ref 3.87–5.11)
RDW: 12.7 % (ref 11.5–15.5)
WBC: 5.7 10*3/uL (ref 4.0–10.5)
nRBC: 0 % (ref 0.0–0.2)

## 2024-05-06 LAB — COMPREHENSIVE METABOLIC PANEL WITH GFR
ALT: 28 U/L (ref 0–44)
AST: 28 U/L (ref 15–41)
Albumin: 4 g/dL (ref 3.5–5.0)
Alkaline Phosphatase: 64 U/L (ref 38–126)
Anion gap: 11 (ref 5–15)
BUN: 14 mg/dL (ref 8–23)
CO2: 22 mmol/L (ref 22–32)
Calcium: 10.4 mg/dL — ABNORMAL HIGH (ref 8.9–10.3)
Chloride: 105 mmol/L (ref 98–111)
Creatinine, Ser: 0.71 mg/dL (ref 0.44–1.00)
GFR, Estimated: >60 mL/min (ref >60)
Glucose, Bld: 96 mg/dL (ref 70–99)
Potassium: 4 mmol/L (ref 3.5–5.1)
Sodium: 138 mmol/L (ref 135–145)
Total Bilirubin: 0.6 mg/dL (ref 0.0–1.2)
Total Protein: 7.2 g/dL (ref 6.5–8.1)

## 2024-05-06 MED ORDER — DENOSUMAB 120 MG/1.7ML ~~LOC~~ SOLN
120.0000 mg | Freq: Once | SUBCUTANEOUS | Status: AC
  Administered 2024-05-06: 120 mg via SUBCUTANEOUS

## 2024-05-06 NOTE — Progress Notes (Signed)
Patient is taking Orserdu as prescribed.  She has not missed any doses and reports no side effects at this time.

## 2024-05-06 NOTE — Patient Instructions (Signed)
 [facility] at [facility] Discharge Instructions   You were seen and examined today by Dr. Cheree Cords.  He reviewed the results of your lab work which are normal/stable.   Continue Orserdu  as prescribed.   We will proceed with your Xgeva  today.   Return as scheduled.    Thank you for choosing [facility] at [facility] to provide your oncology and hematology care.  To afford each patient quality time with our provider, please arrive at least 15 minutes before your scheduled appointment time.   If you have a lab appointment with the Cancer Center please come in thru the Main Entrance and check in at the main information desk.  You need to re-schedule your appointment should you arrive 10 or more minutes late.  We strive to give you quality time with our providers, and arriving late affects you and other patients whose appointments are after yours.  Also, if you no show three or more times for appointments you may be dismissed from the clinic at the providers discretion.     Again, thank you for choosing [facility].  Our hope is that these requests will decrease the amount of time that you wait before being seen by our physicians.       _____________________________________________________________  Should you have questions after your visit to [facility], please contact our office at [phone] and follow the prompts.  Our office hours are 8:00 a.m. and 4:30 p.m. Monday - Friday.  Please note that voicemails left after 4:00 p.m. may not be returned until the following business day.  We are closed weekends and major holidays.  You do have access to a nurse 24-7, just call the main number to the clinic [phone] and do not press any options, hold on the line and a nurse will answer the phone.    For prescription refill requests, have your pharmacy contact our office and allow 72 hours.    Due to Covid, you will  need to wear a mask upon entering the hospital. If you do not have a mask, a mask will be given to you at the Main Entrance upon arrival. For doctor visits, patients may have 1 support person age 39 or older with them. For treatment visits, patients can not have anyone with them due to social distancing guidelines and our immunocompromised population.

## 2024-05-06 NOTE — Progress Notes (Signed)
 Xgeva injection given per orders. Patient tolerated it well without problems. Vitals stable and discharged home from clinic ambulatory. Follow up as scheduled.

## 2024-05-07 LAB — CANCER ANTIGEN 15-3: CA 15-3: 39.1 U/mL — ABNORMAL HIGH (ref 0.0–25.0)

## 2024-05-07 LAB — CANCER ANTIGEN 27.29: CA 27.29: 50.6 U/mL — ABNORMAL HIGH (ref 0.0–38.6)

## 2024-05-18 ENCOUNTER — Other Ambulatory Visit: Payer: Self-pay | Admitting: *Deleted

## 2024-05-18 DIAGNOSIS — C50919 Malignant neoplasm of unspecified site of unspecified female breast: Secondary | ICD-10-CM

## 2024-05-18 DIAGNOSIS — I890 Lymphedema, not elsewhere classified: Secondary | ICD-10-CM

## 2024-05-18 MED ORDER — MISC. DEVICES MISC: 6 refills | Status: AC

## 2024-06-03 ENCOUNTER — Inpatient Hospital Stay

## 2024-06-03 ENCOUNTER — Inpatient Hospital Stay: Attending: Hematology

## 2024-06-03 VITALS — BP 138/89 | HR 68 | Temp 98.8°F | Resp 16

## 2024-06-03 DIAGNOSIS — M8580 Other specified disorders of bone density and structure, unspecified site: Secondary | ICD-10-CM | POA: Insufficient documentation

## 2024-06-03 DIAGNOSIS — C50912 Malignant neoplasm of unspecified site of left female breast: Secondary | ICD-10-CM | POA: Diagnosis not present

## 2024-06-03 DIAGNOSIS — G629 Polyneuropathy, unspecified: Secondary | ICD-10-CM | POA: Diagnosis not present

## 2024-06-03 DIAGNOSIS — Z170 Estrogen receptor positive status [ER+]: Secondary | ICD-10-CM | POA: Diagnosis not present

## 2024-06-03 DIAGNOSIS — Z79899 Other long term (current) drug therapy: Secondary | ICD-10-CM | POA: Insufficient documentation

## 2024-06-03 DIAGNOSIS — G893 Neoplasm related pain (acute) (chronic): Secondary | ICD-10-CM | POA: Diagnosis not present

## 2024-06-03 DIAGNOSIS — Z7982 Long term (current) use of aspirin: Secondary | ICD-10-CM | POA: Diagnosis not present

## 2024-06-03 DIAGNOSIS — Z1732 Human epidermal growth factor receptor 2 negative status: Secondary | ICD-10-CM | POA: Insufficient documentation

## 2024-06-03 DIAGNOSIS — Z79811 Long term (current) use of aromatase inhibitors: Secondary | ICD-10-CM | POA: Insufficient documentation

## 2024-06-03 DIAGNOSIS — C7951 Secondary malignant neoplasm of bone: Secondary | ICD-10-CM | POA: Diagnosis not present

## 2024-06-03 DIAGNOSIS — M810 Age-related osteoporosis without current pathological fracture: Secondary | ICD-10-CM

## 2024-06-03 DIAGNOSIS — Z1721 Progesterone receptor positive status: Secondary | ICD-10-CM | POA: Insufficient documentation

## 2024-06-03 DIAGNOSIS — C50919 Malignant neoplasm of unspecified site of unspecified female breast: Secondary | ICD-10-CM

## 2024-06-03 LAB — CBC WITH DIFFERENTIAL/PLATELET
Abs Immature Granulocytes: 0.04 10*3/uL (ref 0.00–0.07)
Basophils Absolute: 0.1 10*3/uL (ref 0.0–0.1)
Basophils Relative: 1 %
Eosinophils Absolute: 0.1 10*3/uL (ref 0.0–0.5)
Eosinophils Relative: 1 %
HCT: 45.6 % (ref 36.0–46.0)
Hemoglobin: 15.3 g/dL — ABNORMAL HIGH (ref 12.0–15.0)
Immature Granulocytes: 0 %
Lymphocytes Relative: 18 %
Lymphs Abs: 1.7 10*3/uL (ref 0.7–4.0)
MCH: 30.7 pg (ref 26.0–34.0)
MCHC: 33.6 g/dL (ref 30.0–36.0)
MCV: 91.6 fL (ref 80.0–100.0)
Monocytes Absolute: 0.7 10*3/uL (ref 0.1–1.0)
Monocytes Relative: 8 %
Neutro Abs: 6.7 10*3/uL (ref 1.7–7.7)
Neutrophils Relative %: 72 %
Platelets: 332 10*3/uL (ref 150–400)
RBC: 4.98 MIL/uL (ref 3.87–5.11)
RDW: 13 % (ref 11.5–15.5)
WBC: 9.2 10*3/uL (ref 4.0–10.5)
nRBC: 0 % (ref 0.0–0.2)

## 2024-06-03 LAB — COMPREHENSIVE METABOLIC PANEL WITH GFR
ALT: 31 U/L (ref 0–44)
AST: 31 U/L (ref 15–41)
Albumin: 4.1 g/dL (ref 3.5–5.0)
Alkaline Phosphatase: 68 U/L (ref 38–126)
Anion gap: 8 (ref 5–15)
BUN: 13 mg/dL (ref 8–23)
CO2: 23 mmol/L (ref 22–32)
Calcium: 9.8 mg/dL (ref 8.9–10.3)
Chloride: 107 mmol/L (ref 98–111)
Creatinine, Ser: 0.81 mg/dL (ref 0.44–1.00)
GFR, Estimated: >60 mL/min (ref >60)
Glucose, Bld: 122 mg/dL — ABNORMAL HIGH (ref 70–99)
Potassium: 3.7 mmol/L (ref 3.5–5.1)
Sodium: 138 mmol/L (ref 135–145)
Total Bilirubin: 0.6 mg/dL (ref 0.0–1.2)
Total Protein: 7.7 g/dL (ref 6.5–8.1)

## 2024-06-03 MED ORDER — DENOSUMAB 120 MG/1.7ML ~~LOC~~ SOLN
120.0000 mg | Freq: Once | SUBCUTANEOUS | Status: AC
  Administered 2024-06-03: 120 mg via SUBCUTANEOUS
  Filled 2024-06-03: qty 1.7

## 2024-06-03 NOTE — Patient Instructions (Signed)
 [facility]  - A DEPT OF [facility]  Discharge Instructions: Thank you for choosing [facility] to provide your oncology and hematology care.  If you have a lab appointment with the Cancer Center - please note that after April 8th, 2024, all labs will be drawn in the cancer center.  You do not have to check in or register with the main entrance as you have in the past but will complete your check-in in the cancer center.  Wear comfortable clothing and clothing appropriate for easy access to any Portacath or PICC line.   We strive to give you quality time with your provider. You may need to reschedule your appointment if you arrive late (15 or more minutes).  Arriving late affects you and other patients whose appointments are after yours.  Also, if you miss three or more appointments without notifying the office, you may be dismissed from the clinic at the provider's discretion.      For prescription refill requests, have your pharmacy contact our office and allow 72 hours for refills to be completed.    Today you received the following chemotherapy and/or immunotherapy agents Xgeva Denosumab Injection (Oncology) What is this medication? DENOSUMAB (den oh SUE mab) prevents weakened bones caused by cancer. It may also be used to treat noncancerous bone tumors that cannot be removed by surgery. It can also be used to treat high calcium levels in the blood caused by cancer. It works by blocking a protein that causes bones to break down quickly. This slows down the release of calcium from bones, which lowers calcium levels in your blood. It also makes your bones stronger and less likely to break (fracture). This medicine may be used for other purposes; ask your health care provider or pharmacist if you have questions. COMMON BRAND NAME(S): XGEVA What should I tell my care team before I take this medication? They need to know if you have any of these  conditions: Dental disease Having surgery or tooth extraction Infection Kidney disease Low levels of calcium or vitamin D in the blood Malnutrition On hemodialysis Skin conditions or sensitivity Thyroid or parathyroid disease An unusual reaction to denosumab, other medications, foods, dyes, or preservatives Pregnant or trying to get pregnant Breast-feeding How should I use this medication? This medication is for injection under the skin. It is given by your care team in a hospital or clinic setting. A special MedGuide will be given to you before each treatment. Be sure to read this information carefully each time. Talk to your care team about the use of this medication in children. While it may be prescribed for children as young as 13 years for selected conditions, precautions do apply. Overdosage: If you think you have taken too much of this medicine contact a poison control center or emergency room at once. NOTE: This medicine is only for you. Do not share this medicine with others. What if I miss a dose? Keep appointments for follow-up doses. It is important not to miss your dose. Call your care team if you are unable to keep an appointment. What may interact with this medication? Do not take this medication with any of the following: Other medications containing denosumab This medication may also interact with the following: Medications that lower your chance of fighting infection Steroid medications, such as prednisone or cortisone This list may not describe all possible interactions. Give your health care provider a list of all the medicines,  herbs, non-prescription drugs, or dietary supplements you use. Also tell them if you smoke, drink alcohol, or use illegal drugs. Some items may interact with your medicine. What should I watch for while using this medication? Your condition will be monitored carefully while you are receiving this medication. You may need blood work while  taking this medication. This medication may increase your risk of getting an infection. Call your care team for advice if you get a fever, chills, sore throat, or other symptoms of a cold or flu. Do not treat yourself. Try to avoid being around people who are sick. You should make sure you get enough calcium and vitamin D while you are taking this medication, unless your care team tells you not to. Discuss the foods you eat and the vitamins you take with your care team. Some people who take this medication have severe bone, joint, or muscle pain. This medication may also increase your risk for jaw problems or a broken thigh bone. Tell your care team right away if you have severe pain in your jaw, bones, joints, or muscles. Tell your care team if you have any pain that does not go away or that gets worse. Talk to your care team if you may be pregnant. Serious birth defects can occur if you take this medication during pregnancy and for 5 months after the last dose. You will need a negative pregnancy test before starting this medication. Contraception is recommended while taking this medication and for 5 months after the last dose. Your care team can help you find the option that works for you. What side effects may I notice from receiving this medication? Side effects that you should report to your care team as soon as possible: Allergic reactions--skin rash, itching, hives, swelling of the face, lips, tongue, or throat Bone, joint, or muscle pain Low calcium level--muscle pain or cramps, confusion, tingling, or numbness in the hands or feet Osteonecrosis of the jaw--pain, swelling, or redness in the mouth, numbness of the jaw, poor healing after dental work, unusual discharge from the mouth, visible bones in the mouth Side effects that usually do not require medical attention (report to your care team if they continue or are bothersome): Cough Diarrhea Fatigue Headache Nausea This list may not  describe all possible side effects. Call your doctor for medical advice about side effects. You may report side effects to FDA at 1-800-FDA-1088. Where should I keep my medication? This medication is given in a hospital or clinic. It will not be stored at home. NOTE: This sheet is a summary. It may not cover all possible information. If you have questions about this medicine, talk to your doctor, pharmacist, or health care provider.  2024 Elsevier/Gold Standard (2022-05-08 00:00:00)      To help prevent nausea and vomiting after your treatment, we encourage you to take your nausea medication as directed.  BELOW ARE SYMPTOMS THAT SHOULD BE REPORTED IMMEDIATELY: *FEVER GREATER THAN 100.4 F (38 C) OR HIGHER *CHILLS OR SWEATING *NAUSEA AND VOMITING THAT IS NOT CONTROLLED WITH YOUR NAUSEA MEDICATION *UNUSUAL SHORTNESS OF BREATH *UNUSUAL BRUISING OR BLEEDING *URINARY PROBLEMS (pain or burning when urinating, or frequent urination) *BOWEL PROBLEMS (unusual diarrhea, constipation, pain near the anus) TENDERNESS IN MOUTH AND THROAT WITH OR WITHOUT PRESENCE OF ULCERS (sore throat, sores in mouth, or a toothache) UNUSUAL RASH, SWELLING OR PAIN  UNUSUAL VAGINAL DISCHARGE OR ITCHING   Items with * indicate a potential emergency and should be followed up as soon  as possible or go to the Emergency Department if any problems should occur.  Please show the CHEMOTHERAPY ALERT CARD or IMMUNOTHERAPY ALERT CARD at check-in to the Emergency Department and triage nurse.  Should you have questions after your visit or need to cancel or reschedule your appointment, please contact [facility] [place] - A DEPT OF [facility] [phone]  and follow the prompts.  Office hours are 8:00 a.m. to 4:30 p.m. Monday - Friday. Please note that voicemails left after 4:00 p.m. may not be returned until the following business day.  We are closed weekends and major holidays. You have access to a nurse at  all times for urgent questions. Please call the main number to the clinic [phone] and follow the prompts.  For any non-urgent questions, you may also contact your provider using MyChart. We now offer e-Visits for anyone 2 and older to request care online for non-urgent symptoms. For details visit [email or web address].   Also download the MyChart app! Go to the app store, search "MyChart", open the app, select [place], and log in with your MyChart username and password.

## 2024-06-03 NOTE — Progress Notes (Signed)
 Patient presents today for Xgeva  injection. Patient reports taking her calcium and vitamin D . Denies any recent or scheduled dental work. Patient tolerated injection in right arm with no complaints voiced.  Site clean and dry with no bruising or swelling noted.  No complaints of pain.  Discharged with vital signs stable and no signs or symptoms of distress noted.

## 2024-06-04 LAB — CANCER ANTIGEN 15-3: CA 15-3: 45.7 U/mL — ABNORMAL HIGH (ref 0.0–25.0)

## 2024-06-04 LAB — CANCER ANTIGEN 27.29: CA 27.29: 67.3 U/mL — ABNORMAL HIGH (ref 0.0–38.6)

## 2024-06-08 ENCOUNTER — Other Ambulatory Visit: Payer: Self-pay | Admitting: *Deleted

## 2024-06-08 DIAGNOSIS — C50919 Malignant neoplasm of unspecified site of unspecified female breast: Secondary | ICD-10-CM

## 2024-06-08 MED ORDER — ELACESTRANT HYDROCHLORIDE 86 MG PO TABS: 258.0000 mg | ORAL_TABLET | Freq: Every day | ORAL | 0 refills | Status: DC

## 2024-06-08 NOTE — Telephone Encounter (Signed)
 [facility]  refill approved.  Patient is tolerating and is to continue therapy at this time

## 2024-06-10 ENCOUNTER — Ambulatory Visit (HOSPITAL_COMMUNITY)
Admission: RE | Admit: 2024-06-10 | Discharge: 2024-06-10 | Disposition: A | Discharge status: Home / Self Care | Source: Ambulatory Visit | Attending: Hematology | Admitting: Hematology

## 2024-06-10 DIAGNOSIS — C7951 Secondary malignant neoplasm of bone: Secondary | ICD-10-CM | POA: Diagnosis not present

## 2024-06-10 DIAGNOSIS — C50919 Malignant neoplasm of unspecified site of unspecified female breast: Secondary | ICD-10-CM | POA: Insufficient documentation

## 2024-06-10 MED ORDER — FLUDEOXYGLUCOSE F - 18 (FDG) INJECTION
8.8400 | Freq: Once | INTRAVENOUS | Status: AC | PRN
  Administered 2024-06-10: 8.84 via INTRAVENOUS

## 2024-06-15 NOTE — Progress Notes (Signed)
 [facility] [address] [place] [address]    Clinic Day:  06/16/2024  Referring physician: Tobi Fortes, MD  Patient Care Team: Alison Irvine, FNP as PCP - General (Family Medicine) Gerhard Knuckles, RN as Oncology Nurse Navigator (Oncology) Paulett Boros, MD as Medical Oncologist (Medical Oncology)   ASSESSMENT & PLAN:   Assessment: 1.  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 - Started on Ibrance  and Faslodex  in December 2021 for metastatic disease. - PET scan on 08/10/2021 with no evidence of hypermetabolic metastatic disease.  Widespread scattered sclerotic bone metastasis in the spine and pelvis are stable and not metabolically active.  No lymphadenopathy.  Asymmetric muscular uptake in the left lower scalene muscles and in the intercostal muscles of the ventral upper left chest wall without discrete mass correlate on the CT images, favoring activity related uptake. - Ibrance  was dose reduced to 100 mg 3 weeks on/1 week off on 10/11/2021 due to fatigue.  Discontinued on 07/23/2023 due to progression - Ribociclib  and exemestane  started on 08/07/2023- 10/20/23-- held for rash - PET scan 10/30/2023: Stable exam with persistent increased radiotracer uptake in the sclerotic T9 and T7 vertebral bodies, slightly better compared to the previous scan from July. - She could not tolerate exemestane  and ribociclib  secondary to hives and rash.  She could not tolerate anastrozole  due to hives. - Letrozole  2.5 mg daily started on 01/16/2024, discontinued after 1 dose due to irritation of the throat and tongue. - Guardant360 (01/23/2024): ESR1 D538G - Elacestrant  258 mg daily started on 02/23/2024, discontinued on 06/16/2024 due to progression. -  Guardant360 (06/16/2024): - Enhertu started on  2.  Social/family history: - She is a retired Engineer, structural.  She recently moved back from [place]. - Younger sister had breast cancer in her 36s.  Mother died of leukemia at age 70.  Paternal cousin also had breast cancer.  3.  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  She underwent immediate implants which were later removed on 12/10/2019 secondary to infection and necrosis. - XRT to the chest wall and regional lymph nodes, 60 Gray, from 02/29/2020 through 04/12/2020 - She was treated with adjuvant anastrozole  which was started 12/09/2019 and was found to have metastatic disease in November 2021. - Previous genetic testing was reportedly negative and [place].    Plan: 1.  Metastatic breast cancer to the bones, ER/PR positive, HER2 negative: - She is taking elacestrant  258 mg daily. - We reviewed labs from 06/03/2024: Normal LFTs and creatinine.  CBC was normal.  CA 15-3 was 45.7, from 39.  CA 27-29 was up at 67, from 50. - We reviewed PET scan images (06/10/2024): Progression of bone metastatic disease with multiple new lesions in the spine, pelvis.  No visible metastatic disease. - Recommend discontinuation of elacestrant . - Will send Guardant360. - I reviewed her previous pathology which showed HER2 2+.  I have recommended change in therapy to Enhertu.  We will  obtain baseline 2D echocardiogram.  We will also request IR for port placement. - We discussed side effects of Enhertu including nausea/vomiting, hair thinning, rare chance of interstitial lung  disease/pneumonitis, decrease in ejection fraction and cytopenias among others.  She is agreeable. - We will schedule her first treatment in 3 weeks.  2.  Peripheral neuropathy: - Tingling or numbness in the hands and feet along with burning sensation is stable. - Continue Lyrica  25 mg twice daily.  She is taking it as needed.  3.  Bone metastatic disease: - Continue calcium 1200 mg plus vitamin D .  Calcium is normal at 9.8.  Continue denosumab  monthly.  4.  Leg swellings: - Continue Lasix  daily as needed.  5.  Cancer related pains: - She reports worsening pains in the back and hip region.  Tylenol  is not helping. - Will start her on hydrocodone  5/325 every 8 hours as needed.    Orders Placed This Encounter  Procedures   IR IMAGING GUIDED PORT INSERTION    Standing Status:   Future    Expected Date:   06/23/2024    Expiration Date:   06/16/2025    Reason for Exam (SYMPTOM  OR DIAGNOSIS REQUIRED):   chemotherapy administration    Preferred Imaging Location?:   [facility]   Cancer antigen 15-3    Standing Status:   Future    Expected Date:   06/30/2024    Expiration Date:   06/30/2025   Magnesium    Standing Status:   Future    Expected Date:   06/30/2024    Expiration Date:   06/30/2025   CBC with Differential    Standing Status:   Future    Expected Date:   06/30/2024    Expiration Date:   06/30/2025   Comprehensive metabolic panel    Standing Status:   Future    Expected Date:   06/30/2024    Expiration Date:   06/30/2025   Magnesium    Standing Status:   Future    Expected Date:   07/21/2024    Expiration Date:   07/21/2025   CBC with Differential    Standing Status:   Future    Expected Date:   07/21/2024    Expiration Date:   07/21/2025   Comprehensive metabolic panel    Standing Status:   Future    Expected Date:   07/21/2024    Expiration Date:   07/21/2025   Cancer antigen 15-3    Standing Status:   Future    Expected Date:   08/11/2024    Expiration Date:   08/11/2025    Magnesium    Standing Status:   Future    Expected Date:   08/11/2024    Expiration Date:   08/11/2025   CBC with Differential    Standing Status:   Future    Expected Date:   08/11/2024    Expiration Date:   08/11/2025   Comprehensive metabolic panel    Standing Status:   Future    Expected Date:   08/11/2024    Expiration Date:   08/11/2025   ECHOCARDIOGRAM COMPLETE    Standing Status:   Future    Expected Date:   06/30/2024    Expiration Date:   06/16/2025    Where should this test be performed:   [facility]    Perflutren DEFINITY (image enhancing agent) should be administered unless hypersensitivity or allergy exist:   Administer Perflutren  Reason for exam-Echo:   Chemo  Z09     I,Helena R Teague,acting as a Neurosurgeon for Doris Gruhn, MD.,have documented all relevant documentation on the behalf of Paulett Boros, MD,as directed by  Paulett Boros, MD while in the presence of Paulett Boros, MD.  I, Paulett Boros MD, have reviewed the above documentation for accuracy and completeness, and I agree with the above.     Paulett Boros, MD   6/18/202512:26 PM  CHIEF COMPLAINT:   Diagnosis: metastatic breast cancer    Cancer Staging  No matching staging information was found for the patient.    Prior Therapy: 1. Palbociclib  and fulvestrant , 11/2020 - 07/23/23 2. Ribociclib  every 4 weeks, 08/07/23 - 10/20/23, held for rash  Current Therapy: Elacestrant    HISTORY OF PRESENT ILLNESS:   Oncology History  Primary malignant neoplasm of breast with metastasis (HCC)  04/28/2019 Genetic Testing   Negative genetic testing on the common hereditary cancer panel through [facility].  The report date is April 28, 2019.  The Common Hereditary Gene Panel offered by [facility] includes sequencing and/or deletion duplication testing of the following 47 genes: APC, ATM, AXIN2, BARD1, BMPR1A, BRCA1, BRCA2, BRIP1, CDH1, CDK4, CDKN2A (p14ARF), CDKN2A (p16INK4a), CHEK2,  CTNNA1, DICER1, EPCAM (Deletion/duplication testing only), GREM1 (promoter region deletion/duplication testing only), HOXB13, KIT, MEN1, MLH1, MSH2, MSH3, MSH6, MUTYH, NBN, NF1, NHTL1, PALB2, PDGFRA, PMS2, POLD1, POLE, PTEN, RAD50, RAD51C, RAD51D, SDHB, SDHC, SDHD, SMAD4, SMARCA4. STK11, TP53, TSC1, TSC2, and VHL.  The following genes were evaluated for sequence changes only: SDHA.    07/06/2021 Initial Diagnosis   Metastatic breast cancer (HCC)   07/18/2021 - 08/15/2022 Chemotherapy   Patient is on Treatment Plan : BREAST Palbociclib  + Fulvestrant  q28d     07/18/2021 - 01/14/2024 Chemotherapy   Patient is on Treatment Plan : BREAST Palbociclib  D1-21 + Fulvestrant  q28d     06/30/2024 -  Chemotherapy   Patient is on Treatment Plan : BREAST Fam-Trastuzumab Deruxtecan-nxki (Enhertu) (5.4) q21d        INTERVAL HISTORY:   Wendy Zamora is a 63 y.o. female presenting to clinic today for follow up of metastatic breast cancer. She was last seen by me on 05/06/24.  Since her last visit, she underwent restaging PET on 06/10/24.   Today, she states that she is doing well overall. Her appetite level is at 70%. Her energy level is at 40%.  PAST MEDICAL HISTORY:   Past Medical History: Past Medical History:  Diagnosis Date   Allergy    Asthma    Asthma    diagnosed age 39   Bone metastasis 08/2020   was on prolia    Breast cancer (HCC)    started chemo 05/05/2019. finished 08/2019   Gangrene (HCC)    GERD (gastroesophageal reflux disease)    Hx of bilateral mastectomy 10/22/2019   Hypertension    Hypertension    started bp meds 2008   Lymphedema of arm    left arm   Osteopenia    Shingles 04/20/2020   left side    Surgical History: Past Surgical History:  Procedure Laterality Date   BIOPSY  01/22/2022   Procedure: BIOPSY;  Surgeon: Urban Garden, MD;  Location: [facility];  Service: Gastroenterology;;   Ritta Chessman RELEASE Bilateral 2006   ESOPHAGOGASTRODUODENOSCOPY (EGD) WITH  PROPOFOL  N/A 01/22/2022   Procedure: ESOPHAGOGASTRODUODENOSCOPY (EGD) WITH PROPOFOL ;  Surgeon: Urban Garden, MD;  Location: [facility];  Service: Gastroenterology;  Laterality: N/A;  205   hysteroscopy  age 75   mastectomy Bilateral 10/22/2019   pt had necrosis after 1st surgery, had another surgery to remove implants 12/10/2019   MOUTH SURGERY     TUBAL LIGATION     TUBAL LIGATION Bilateral 1998   WISDOM TOOTH EXTRACTION     early 57's    Social History: Social History   Socioeconomic History   Marital status: Married    Spouse name: Not on file   Number of children: 2   Years of education: Not on file   Highest education level: GED or equivalent  Occupational History   Occupation: Disability  Tobacco Use   Smoking status: Every Day    Current packs/day: 0.50    Average packs/day: 0.5 packs/day for 40.0 years (20.0 ttl pk-yrs)    Types: Cigarettes   Smokeless tobacco: Never   Tobacco comments:    Smokes half a pack a day for 40 years.   Vaping Use   Vaping status: Never Used  Substance and Sexual Activity   Alcohol use: Never   Drug use: Never   Sexual activity: Yes  Other Topics Concern   Not on file  Social History Narrative   Lives with her husband.    Social Drivers of Health   Financial Resource Strain: Medium Risk (03/19/2024)   Overall Financial Resource Strain (CARDIA)    Difficulty of Paying Living Expenses: Somewhat hard  Food Insecurity: Food Insecurity Present (03/19/2024)   Hunger Vital Sign    Worried About Running Out of Food in the Last Year: Sometimes true    Ran Out of Food in the Last Year: Sometimes true  Transportation Needs: No Transportation Needs (03/19/2024)   PRAPARE - Administrator, Civil Service (Medical): No    Lack of Transportation (Non-Medical): No  Physical Activity: Unknown (03/19/2024)   Exercise Vital Sign    Days of Exercise per Week: Patient declined    Minutes of Exercise per Session: Not on  file  Stress: Stress Concern Present (03/19/2024)   [employer] of Occupational Health - Occupational Stress Questionnaire    Feeling of Stress : To some extent  Social Connections: Unknown (03/19/2024)   Social Connection and Isolation Panel    Frequency of Communication with Friends and Family: Twice a week    Frequency of Social Gatherings with Friends and Family: Once a week    Attends Religious Services: Patient declined    Database administrator or Organizations: No    Attends Banker Meetings: Not on file    Marital Status: Married  Intimate Partner Violence: Not At Risk (03/04/2022)   Received from [facility] ([facility]   Humiliation, Afraid, Rape, and Kick questionnaire    Within the last year, have you been afraid of your partner or ex-partner?: No    Within the last year, have you been humiliated or emotionally abused in other ways by your partner or ex-partner?: No    Within the last year, have you been kicked, hit, slapped, or otherwise physically hurt by your partner or ex-partner?: No    Within the last year, have you been raped or forced to have any kind of sexual activity by your partner or ex-partner?: No    Family History: Family History  Problem Relation Age of Onset   Leukemia Mother    Breast cancer Sister    Diabetes Maternal Uncle    Hyperlipidemia Maternal Uncle    Diabetes Mellitus II Maternal Grandmother  Heart disease Maternal Grandfather    Diabetes Maternal Grandfather    Breast cancer Cousin    Breast cancer Half-Sister    Osteoporosis Half-Sister    Lupus Half-Sister    Lupus Half-Sister    Osteoporosis Half-Sister    Cancer - Lung Neg Hx    Colon cancer Neg Hx     Current Medications:  Current Outpatient Medications:    amLODipine  (NORVASC ) 5 MG tablet, Take 1 tablet by mouth once daily, Disp: 90 tablet, Rfl: 0   Ascorbic Acid (VITAMIN C) 1000 MG tablet, Take 1,000 mg by mouth daily., Disp: , Rfl:    aspirin   81 MG EC tablet, Take 81 mg by mouth daily., Disp: , Rfl:    betamethasone  dipropionate 0.05 % cream, Apply topically 2 (two) times daily., Disp: 30 g, Rfl: 1   Calcium-Magnesium-Vitamin D  (CALCIUM 1200+D3 PO), Take 1 tablet by mouth daily., Disp: , Rfl:    cetirizine-pseudoephedrine (ZYRTEC-D) 5-120 MG tablet, Take 1 tablet by mouth daily., Disp: , Rfl:    CINNAMON PO, Take 1,000 mg by mouth daily., Disp: , Rfl:    cyanocobalamin  1000 MCG tablet, Take 1 tablet (1,000 mcg total) by mouth daily. (Patient taking differently: Take 1,000 mcg by mouth every other day.), Disp: 30 tablet, Rfl: 6   cyclobenzaprine  (FLEXERIL ) 10 MG tablet, Take 1 tablet (10 mg total) by mouth 2 (two) times daily as needed for muscle spasms., Disp: 60 tablet, Rfl: 5   denosumab  (XGEVA ) 120 MG/1.7ML SOLN injection, Inject 120 mg into the skin every 30 (thirty) days., Disp: , Rfl:    dexamethasone  0.5 mg/5 mL - diphenhydrAMINE  12.5 mg/5 mL oral solution 2:1 mixture, Take 10 mLs by mouth 2 (two) times daily., Disp: 480 mL, Rfl: 2   elacestrant  hydrochloride (ORSERDU ) 86 MG tablet, Take 3 tablets (258 mg total) by mouth daily. Take with food., Disp: 90 tablet, Rfl: 0   furosemide  (LASIX ) 20 MG tablet, Take 1 tablet (20 mg total) by mouth daily as needed for edema (Take daily as needed for edema)., Disp: 30 tablet, Rfl: 2   HYDROcodone -acetaminophen  (NORCO) 5-325 MG tablet, Take 1 tablet by mouth every 12 (twelve) hours as needed for moderate pain., Disp: 30 tablet, Rfl: 0   lansoprazole  (PREVACID ) 30 MG capsule, TAKE 1 CAPSULE BY MOUTH IN THE MORNING AND AT BEDTIME, Disp: 120 capsule, Rfl: 3   Misc. Devices MISC, Please provide patient with left arm compression sleeve. Diagnosis metastatic breast cancer, bilateral mastectomy, left arm lymphedema, Disp: 1 each, Rfl: 6   Misc. Devices MISC, Please provide with prosthetic mastectomy bra, Disp: 1 each, Rfl: 6   NAPHCON-A 0.025-0.3 % ophthalmic solution, Place 2 drops into both eyes in  the morning and at bedtime., Disp: , Rfl:    pregabalin  (LYRICA ) 25 MG capsule, Take 1 capsule by mouth twice daily, Disp: 60 capsule, Rfl: 3   prochlorperazine  (COMPAZINE ) 10 MG tablet, Take 1 tablet (10 mg total) by mouth every 6 (six) hours as needed for nausea or vomiting., Disp: 60 tablet, Rfl: 2   Allergies: Allergies  Allergen Reactions   Augmentin [Amoxicillin-Pot Clavulanate] Anaphylaxis   Ciprofloxacin Anaphylaxis   Claritin [Loratadine] Anaphylaxis   Gabapentin Anaphylaxis   Keflex [Cephalexin] Anaphylaxis   Levofloxacin Anaphylaxis   Lisinopril Swelling   Lisinopril Anaphylaxis   Metoprolol Swelling   Metoprolol Anaphylaxis   Nexium [Esomeprazole] Anaphylaxis   Sulfa Antibiotics Hives and Swelling   Tetracyclines & Related Anaphylaxis   Iohexol  Hives    Needs pre  meds   Ultram  [Tramadol ] Other (See Comments)    Patient states medication irritates throat    Anastrozole  Hives   Ceftin [Cefuroxime Axetil] Hives   Chromium Other (See Comments)    headache   Exemestane  Hives   Iodinated Contrast Media Hives   Kisqali  (200 Mg Dose) [Ribociclib  Succ (200 Mg Dose)] Hives   Misc. Sulfonamide Containing Compounds Other (See Comments)   Oxycodone Itching   Sulfa Antibiotics     Possible Reaction    Latex Rash    itching   Nickel Rash    itching    REVIEW OF SYSTEMS:   Review of Systems  Constitutional:  Negative for chills, fatigue and fever.  HENT:   Negative for lump/mass, mouth sores, nosebleeds, sore throat and trouble swallowing.   Eyes:  Negative for eye problems.  Respiratory:  Positive for cough and shortness of breath.   Cardiovascular:  Negative for chest pain, leg swelling and palpitations.  Gastrointestinal:  Positive for constipation. Negative for abdominal pain, diarrhea, nausea and vomiting.  Genitourinary:  Negative for bladder incontinence, difficulty urinating, dysuria, frequency, hematuria and nocturia.   Musculoskeletal:  Positive for back pain.  Negative for arthralgias, flank pain, myalgias and neck pain.  Skin:  Negative for itching and rash.  Neurological:  Positive for headaches. Negative for dizziness and numbness.  Hematological:  Does not bruise/bleed easily.  Psychiatric/Behavioral:  Negative for depression, sleep disturbance and suicidal ideas. The patient is nervous/anxious.   All other systems reviewed and are negative.    VITALS:   Blood pressure 137/88, pulse 84, temperature 97.9 F (36.6 C), temperature source Oral, resp. rate 19, weight 177 lb 8 oz (80.5 kg), last menstrual period 06/08/2012, SpO2 97%.  Wt Readings from Last 3 Encounters:  06/16/24 177 lb 8 oz (80.5 kg)  05/06/24 179 lb (81.2 kg)  04/08/24 178 lb 2.1 oz (80.8 kg)    Body mass index is 30 kg/m.  Performance status (ECOG): 1 - Symptomatic but completely ambulatory  PHYSICAL EXAM:   Physical Exam Vitals and nursing note reviewed. Exam conducted with a chaperone present.  Constitutional:      Appearance: Normal appearance.   Cardiovascular:     Rate and Rhythm: Normal rate and regular rhythm.     Pulses: Normal pulses.     Heart sounds: Normal heart sounds.  Pulmonary:     Effort: Pulmonary effort is normal.     Breath sounds: Normal breath sounds.  Abdominal:     Palpations: Abdomen is soft. There is no hepatomegaly, splenomegaly or mass.     Tenderness: There is no abdominal tenderness.   Musculoskeletal:     Right lower leg: No edema.     Left lower leg: No edema.  Lymphadenopathy:     Cervical: No cervical adenopathy.     Right cervical: No superficial, deep or posterior cervical adenopathy.    Left cervical: No superficial, deep or posterior cervical adenopathy.     Upper Body:     Right upper body: No supraclavicular or axillary adenopathy.     Left upper body: No supraclavicular or axillary adenopathy.   Neurological:     General: No focal deficit present.     Mental Status: She is alert and oriented to person, place,  and time.   Psychiatric:        Mood and Affect: Mood normal.        Behavior: Behavior normal.     LABS:   CBC  Component Value Date/Time   WBC 9.2 06/03/2024 1022   RBC 4.98 06/03/2024 1022   HGB 15.3 (H) 06/03/2024 1022   HCT 45.6 06/03/2024 1022   PLT 332 06/03/2024 1022   MCV 91.6 06/03/2024 1022   MCH 30.7 06/03/2024 1022   MCHC 33.6 06/03/2024 1022   RDW 13.0 06/03/2024 1022   LYMPHSABS 1.7 06/03/2024 1022   MONOABS 0.7 06/03/2024 1022   EOSABS 0.1 06/03/2024 1022   BASOSABS 0.1 06/03/2024 1022    CMP      Component Value Date/Time   NA 138 06/03/2024 1022   K 3.7 06/03/2024 1022   CL 107 06/03/2024 1022   CO2 23 06/03/2024 1022   GLUCOSE 122 (H) 06/03/2024 1022   BUN 13 06/03/2024 1022   CREATININE 0.81 06/03/2024 1022   CALCIUM 9.8 06/03/2024 1022   PROT 7.7 06/03/2024 1022   ALBUMIN 4.1 06/03/2024 1022   AST 31 06/03/2024 1022   ALT 31 06/03/2024 1022   ALKPHOS 68 06/03/2024 1022   BILITOT 0.6 06/03/2024 1022   GFRNONAA >60 06/03/2024 1022   GFRAA >90 06/08/2012 1251     No results found for: CEA1, CEA / No results found for: CEA1, CEA No results found for: PSA1 No results found for: NWG956 No results found for: CAN125  No results found for: TOTALPROTELP, ALBUMINELP, A1GS, A2GS, BETS, BETA2SER, GAMS, MSPIKE, SPEI No results found for: TIBC, FERRITIN, IRONPCTSAT No results found for: LDH   STUDIES:   NM PET Image Restage (PS) Skull Base to Thigh (F-18 FDG) Result Date: 06/15/2024 CLINICAL DATA:  Subsequent treatment strategy for breast carcinoma. Skeletal metastasis. EXAM: NUCLEAR MEDICINE PET SKULL BASE TO THIGH TECHNIQUE: 8.8 mCi F-18 FDG was injected intravenously. Full-ring PET imaging was performed from the skull base to thigh after the radiotracer. CT data was obtained and used for attenuation correction and anatomic localization. Fasting blood glucose: 105 mg/dl COMPARISON:  PET scan 21/30/8657  FINDINGS: NECK: No hypermetabolic lymph nodes in the neck. Incidental CT findings: None. CHEST: Post mastectomy anatomy. Subpleural thickening in the LEFT upper lobe related to radiation treatment. No suspicious nodules. No hypermetabolic mediastinal lymph nodes. No hypermetabolic axillary lymph nodes. Incidental CT findings: ABDOMEN/PELVIS: No abnormal hypermetabolic activity within the liver, pancreas, adrenal glands, or spleen. No hypermetabolic lymph nodes in the abdomen or pelvis. Incidental CT findings: None. SKELETON: Multiple new hypermetabolic skeletal metastasis within the pelvis, spine and ribs. For example new lesions in the RIGHT iliac bone with SUV max equal 6.7 on image 130. New lesion in the LEFT iliac bone on image 124 is also hypermetabolic. New lesions in the RIGHT sacral ala with SUV max 78. Multiple new spinal lesions. Example lesion L4 SUV max 7.5 retrospect a subtle there on comparison exam. Previously described lesions at T7 and T9 again demonstrated. T9 lesion with SUV max equal 10.9 compared SUV max equal 7. RIGHT scapular lesion on image 54 Incidental CT findings: None. IMPRESSION: 1. 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 Previously 2 distinct thoracic spine lesions. 2. Stable lesions in the thoracic spine. 3. No evidence of visceral metastasis. Electronically Signed   By: Deboraha Fallow M.D.   On: 06/15/2024 14:56

## 2024-06-16 ENCOUNTER — Other Ambulatory Visit: Payer: Self-pay | Admitting: Radiology

## 2024-06-16 ENCOUNTER — Inpatient Hospital Stay

## 2024-06-16 ENCOUNTER — Inpatient Hospital Stay: Admitting: Hematology

## 2024-06-16 ENCOUNTER — Other Ambulatory Visit: Payer: Self-pay | Admitting: *Deleted

## 2024-06-16 VITALS — BP 137/88 | HR 84 | Temp 97.9°F | Resp 19 | Wt 177.5 lb

## 2024-06-16 DIAGNOSIS — Z79811 Long term (current) use of aromatase inhibitors: Secondary | ICD-10-CM | POA: Diagnosis not present

## 2024-06-16 DIAGNOSIS — G893 Neoplasm related pain (acute) (chronic): Secondary | ICD-10-CM | POA: Diagnosis not present

## 2024-06-16 DIAGNOSIS — C50919 Malignant neoplasm of unspecified site of unspecified female breast: Secondary | ICD-10-CM

## 2024-06-16 DIAGNOSIS — C50912 Malignant neoplasm of unspecified site of left female breast: Secondary | ICD-10-CM | POA: Diagnosis not present

## 2024-06-16 DIAGNOSIS — Z79899 Other long term (current) drug therapy: Secondary | ICD-10-CM | POA: Diagnosis not present

## 2024-06-16 DIAGNOSIS — G629 Polyneuropathy, unspecified: Secondary | ICD-10-CM | POA: Diagnosis not present

## 2024-06-16 DIAGNOSIS — C7951 Secondary malignant neoplasm of bone: Secondary | ICD-10-CM | POA: Diagnosis not present

## 2024-06-16 DIAGNOSIS — Z170 Estrogen receptor positive status [ER+]: Secondary | ICD-10-CM | POA: Diagnosis not present

## 2024-06-16 DIAGNOSIS — M8580 Other specified disorders of bone density and structure, unspecified site: Secondary | ICD-10-CM | POA: Diagnosis not present

## 2024-06-16 DIAGNOSIS — Z1721 Progesterone receptor positive status: Secondary | ICD-10-CM | POA: Diagnosis not present

## 2024-06-16 DIAGNOSIS — Z7982 Long term (current) use of aspirin: Secondary | ICD-10-CM | POA: Diagnosis not present

## 2024-06-16 DIAGNOSIS — Z1732 Human epidermal growth factor receptor 2 negative status: Secondary | ICD-10-CM | POA: Diagnosis not present

## 2024-06-16 MED ORDER — HYDROCODONE-ACETAMINOPHEN 5-325 MG PO TABS: 1.0000 | ORAL_TABLET | Freq: Three times a day (TID) | ORAL | 0 refills | Status: DC | PRN

## 2024-06-16 MED ORDER — LIDOCAINE-PRILOCAINE 2.5-2.5 % EX CREA: 1.0000 | TOPICAL_CREAM | CUTANEOUS | 6 refills | Status: AC | PRN

## 2024-06-16 NOTE — Progress Notes (Signed)
 Patient is taking Orsurdu as prescribed. She has not missed any doses and reports no side effects at this time.

## 2024-06-16 NOTE — Patient Instructions (Addendum)
 [facility] at [facility] Discharge Instructions   You were seen and examined today by Dr. Cheree Cords.  He reviewed the results of your lab work which are normal/stable.   He reviewed the results of your PET scan. It is showing progression of the cancer in the bones. There are multiple new hypermetabolic lesions in the pelvis, spine and there are approximately 20 skeletal lesions now. Previously only 2 distinct thoracic spine lesions were seen.   We will send special testing on blood work to see if there are any mutations to target for treatment on the cancer. Stop taking Orserdu .   We will see you back in 3 weeks.   Return as scheduled.    Thank you for choosing [facility] at [facility] to provide your oncology and hematology care.  To afford each patient quality time with our provider, please arrive at least 15 minutes before your scheduled appointment time.   If you have a lab appointment with the Cancer Center please come in thru the Main Entrance and check in at the main information desk.  You need to re-schedule your appointment should you arrive 10 or more minutes late.  We strive to give you quality time with our providers, and arriving late affects you and other patients whose appointments are after yours.  Also, if you no show three or more times for appointments you may be dismissed from the clinic at the providers discretion.     Again, thank you for choosing [facility].  Our hope is that these requests will decrease the amount of time that you wait before being seen by our physicians.       _____________________________________________________________  Should you have questions after your visit to [facility], please contact our office at [phone] and follow the prompts.  Our office hours are 8:00 a.m. and 4:30 p.m. Monday - Friday.  Please note that voicemails left after 4:00 p.m. may not be  returned until the following business day.  We are closed weekends and major holidays.  You do have access to a nurse 24-7, just call the main number to the clinic [phone] and do not press any options, hold on the line and a nurse will answer the phone.    For prescription refill requests, have your pharmacy contact our office and allow 72 hours.    Due to Covid, you will need to wear a mask upon entering the hospital. If you do not have a mask, a mask will be given to you at the Main Entrance upon arrival. For doctor visits, patients may have 1 support person age 4 or older with them. For treatment visits, patients can not have anyone with them due to social distancing guidelines and our immunocompromised population.

## 2024-06-16 NOTE — Progress Notes (Signed)
 DISCONTINUE ON PATHWAY REGIMEN - Breast     Cycle 1: A cycle is 28 days:     Palbociclib       Fulvestrant     Cycles 2 and beyond: A cycle is every 28 days:     Palbociclib       Fulvestrant    **Always confirm dose/schedule in your pharmacy ordering system**  PRIOR TREATMENT: [id number]: Palbociclib  125 mg D1-21 + Fulvestrant  500 mg q28 Days  START ON PATHWAY REGIMEN - Breast     A cycle is every 21 days:     Fam-trastuzumab deruxtecan-nxki   **Always confirm dose/schedule in your pharmacy ordering system**  Patient Characteristics: Distant Metastases or Locoregional Recurrent Disease - Unresected, M0 or Locally Advanced Unresectable Disease Progressing after Neoadjuvant and Local Therapies, M0, HER2 Negative/Ultralow/Low, ER Positive, Chemotherapy, HER2 Ultralow/Low, Second Line,  gBRCA Wildtype or Not a Candidate for Molecular Targeted Therapy Therapeutic Status: Distant Metastases HER2 Status: Low ER Status: Positive (+) PR Status: Positive (+) Therapy Approach Indicated: Standard Chemotherapy/Endocrine Therapy Line of Therapy: Second Line Intent of Therapy: Non-Curative / Palliative Intent, Discussed with Patient

## 2024-06-17 ENCOUNTER — Other Ambulatory Visit: Payer: Self-pay

## 2024-06-17 ENCOUNTER — Ambulatory Visit (HOSPITAL_COMMUNITY)
Admission: RE | Admit: 2024-06-17 | Discharge: 2024-06-17 | Disposition: A | Discharge status: Home / Self Care | Source: Ambulatory Visit | Attending: Hematology | Admitting: Hematology

## 2024-06-17 ENCOUNTER — Other Ambulatory Visit: Payer: Self-pay | Admitting: *Deleted

## 2024-06-17 DIAGNOSIS — Z5941 Food insecurity: Secondary | ICD-10-CM | POA: Insufficient documentation

## 2024-06-17 DIAGNOSIS — C50919 Malignant neoplasm of unspecified site of unspecified female breast: Secondary | ICD-10-CM | POA: Insufficient documentation

## 2024-06-17 DIAGNOSIS — Z5986 Financial insecurity: Secondary | ICD-10-CM | POA: Diagnosis not present

## 2024-06-17 DIAGNOSIS — Z9013 Acquired absence of bilateral breasts and nipples: Secondary | ICD-10-CM | POA: Diagnosis not present

## 2024-06-17 DIAGNOSIS — F17210 Nicotine dependence, cigarettes, uncomplicated: Secondary | ICD-10-CM | POA: Insufficient documentation

## 2024-06-17 HISTORY — PX: IR IMAGING GUIDED PORT INSERTION: IMG5740

## 2024-06-17 MED ORDER — MIDAZOLAM HCL 2 MG/2ML IJ SOLN
INTRAMUSCULAR | Status: AC
Start: 2024-06-17 — End: 2024-06-17
  Filled 2024-06-17: qty 2

## 2024-06-17 MED ORDER — MIDAZOLAM HCL 2 MG/2ML IJ SOLN
INTRAMUSCULAR | Status: AC | PRN
  Administered 2024-06-17 (×2): 1 mg via INTRAVENOUS

## 2024-06-17 MED ORDER — LIDOCAINE HCL 1 % IJ SOLN
INTRAMUSCULAR | Status: AC
Start: 2024-06-17 — End: 2024-06-17
  Filled 2024-06-17: qty 20

## 2024-06-17 MED ORDER — SODIUM CHLORIDE 0.9 % IV SOLN: INTRAVENOUS | Status: DC

## 2024-06-17 MED ORDER — FENTANYL CITRATE (PF) 100 MCG/2ML IJ SOLN
INTRAMUSCULAR | Status: AC | PRN
  Administered 2024-06-17: 50 ug via INTRAVENOUS
  Administered 2024-06-17: 100 ug via INTRAVENOUS

## 2024-06-17 MED ORDER — FENTANYL CITRATE (PF) 100 MCG/2ML IJ SOLN
INTRAMUSCULAR | Status: AC
Start: 2024-06-17 — End: 2024-06-17
  Filled 2024-06-17: qty 2

## 2024-06-17 MED ORDER — LIDOCAINE HCL 1 % IJ SOLN
20.0000 mL | Freq: Once | INTRAMUSCULAR | Status: AC
  Administered 2024-06-17: 20 mL via INTRADERMAL

## 2024-06-17 MED ORDER — HEPARIN SOD (PORK) LOCK FLUSH 100 UNIT/ML IV SOLN
INTRAVENOUS | Status: AC
  Filled 2024-06-17: qty 5

## 2024-06-17 NOTE — Procedures (Signed)
 Interventional Radiology Procedure Note  Procedure: Single Lumen Power Port Placement    Access:  Right IJ vein.  Findings: Catheter tip positioned at SVC/RA junction. Port is ready for immediate use.   Complications: None  EBL: < 10 mL  Recommendations:  - Ok to shower in 24 hours - Do not submerge for 7 days - Routine line care   Wendy Zamora T. Fredia Sorrow, M.D Pager:  [phone]

## 2024-06-17 NOTE — H&P (Signed)
 Chief Complaint: Patient was seen in consultation today for port placement   Referring Physician(s): Katragadda,Sreedhar  Supervising Physician: Erica Hau  Patient Status: [facility] - Out-pt  History of Present Illness: Wendy Zamora is a 63 y.o. female with metastatic breast cancer. She had previously had a port several years ago for systemic treatment but that was removed. She is referred for new port placement to begin new treatment cycle.  PMHx, meds, labs, imaging, allergies reviewed. Feels well, no recent fevers, chills, illness. Has been NPO today as directed.   Past Medical History:  Diagnosis Date   Allergy    Asthma    Asthma    diagnosed age 22   Bone metastasis 08/2020   was on prolia    Breast cancer (HCC)    started chemo 05/05/2019. finished 08/2019   Gangrene (HCC)    GERD (gastroesophageal reflux disease)    Hx of bilateral mastectomy 10/22/2019   Hypertension    Hypertension    started bp meds 2008   Lymphedema of arm    left arm   Osteopenia    Shingles 04/20/2020   left side    Past Surgical History:  Procedure Laterality Date   BIOPSY  01/22/2022   Procedure: BIOPSY;  Surgeon: Urban Garden, MD;  Location: [facility];  Service: Gastroenterology;;   Ritta Chessman RELEASE Bilateral 2006   ESOPHAGOGASTRODUODENOSCOPY (EGD) WITH PROPOFOL  N/A 01/22/2022   Procedure: ESOPHAGOGASTRODUODENOSCOPY (EGD) WITH PROPOFOL ;  Surgeon: Urban Garden, MD;  Location: [facility];  Service: Gastroenterology;  Laterality: N/A;  205   hysteroscopy     age 45   mastectomy Bilateral 10/22/2019   pt had necrosis after 1st surgery, had another surgery to remove implants 12/10/2019   MOUTH SURGERY     TUBAL LIGATION     TUBAL LIGATION Bilateral 1998   WISDOM TOOTH EXTRACTION     early 30's    Allergies: Augmentin [amoxicillin-pot clavulanate], Ciprofloxacin, Claritin [loratadine], Gabapentin, Keflex [cephalexin], Levofloxacin,  Lisinopril, Lisinopril, Metoprolol, Metoprolol, Nexium [esomeprazole], Sulfa antibiotics, Tetracyclines & related, Iohexol , Ultram  [tramadol ], Anastrozole , Ceftin [cefuroxime axetil], Chromium, Exemestane , Iodinated contrast media, Kisqali  (200 mg dose) [ribociclib  succ (200 mg dose)], Misc. sulfonamide containing compounds, Oxycodone, Sulfa antibiotics, Latex, and Nickel  Medications: Prior to Admission medications   Medication Sig Start Date End Date Taking? Authorizing Provider  amLODipine  (NORVASC ) 5 MG tablet Take 1 tablet by mouth once daily 05/05/24  Yes Dixon, Phillip E, MD  Ascorbic Acid (VITAMIN C) 1000 MG tablet Take 1,000 mg by mouth daily.   Yes [provider]  aspirin  81 MG EC tablet Take 81 mg by mouth daily.   Yes [provider]  Calcium-Magnesium-Vitamin D  (CALCIUM 1200+D3 PO) Take 1 tablet by mouth daily.   Yes [provider]  cetirizine-pseudoephedrine (ZYRTEC-D) 5-120 MG tablet Take 1 tablet by mouth daily.   Yes [provider]  CINNAMON PO Take 1,000 mg by mouth daily.   Yes [provider]  cyclobenzaprine  (FLEXERIL ) 10 MG tablet Take 1 tablet (10 mg total) by mouth 2 (two) times daily as needed for muscle spasms. 01/14/23  Yes Paulett Boros, MD  furosemide  (LASIX ) 20 MG tablet Take 1 tablet (20 mg total) by mouth daily as needed for edema (Take daily as needed for edema). 03/10/24  Yes Paulett Boros, MD  HYDROcodone -acetaminophen  (NORCO/VICODIN) 5-325 MG tablet Take 1 tablet by mouth every 8 (eight) hours as needed for moderate pain (pain score 4-6). 06/16/24  Yes Paulett Boros, MD  lansoprazole  (PREVACID ) 30 MG capsule TAKE 1 CAPSULE BY MOUTH IN THE MORNING AND AT BEDTIME 07/15/23  Yes Carlan, Chelsea L, NP  pregabalin  (LYRICA ) 25 MG capsule Take 1 capsule by mouth twice daily 03/15/24  Yes Katragadda, Sreedhar, MD  betamethasone  dipropionate 0.05 % cream Apply topically 2 (two) times daily. 01/14/24   Paulett Boros, MD  cyanocobalamin  1000 MCG tablet Take 1 tablet (1,000 mcg total) by mouth daily. Patient taking differently: Take 1,000 mcg by mouth every other day. 09/13/21   Paulett Boros, MD  denosumab  (XGEVA ) 120 MG/1.7ML SOLN injection Inject 120 mg into the skin every 30 (thirty) days.    [provider]  dexamethasone  0.5 mg/5 mL - diphenhydrAMINE  12.5 mg/5 mL oral solution 2:1 mixture Take 10 mLs by mouth 2 (two) times daily. 12/17/23   Paulett Boros, MD  elacestrant  hydrochloride (ORSERDU ) 86 MG tablet Take 3 tablets (258 mg total) by mouth daily. Take with food. 06/08/24   Paulett Boros, MD  lidocaine -prilocaine (EMLA) cream Apply 1 Application topically as needed (Apply to port site prior to port access). 06/16/24   Paulett Boros, MD  Misc. Devices MISC Please provide patient with left arm compression sleeve. Diagnosis metastatic breast cancer, bilateral mastectomy, left arm lymphedema 05/18/24   Paulett Boros, MD  Misc. Devices MISC Please provide with prosthetic mastectomy bra 05/18/24   Paulett Boros, MD  NAPHCON-A 0.025-0.3 % ophthalmic solution Place 2 drops into both eyes in the morning and at bedtime. 04/25/21   [provider]  prochlorperazine  (COMPAZINE ) 10 MG tablet Take 1 tablet (10 mg total) by mouth every 6 (six) hours as needed for nausea or vomiting. 02/18/24   Paulett Boros, MD     Family History  Problem Relation Age of Onset   Leukemia Mother    Breast cancer Sister    Diabetes Maternal Uncle    Hyperlipidemia Maternal Uncle    Diabetes Mellitus II Maternal Grandmother    Heart disease Maternal Grandfather    Diabetes Maternal Grandfather    Breast cancer Cousin    Breast cancer Half-Sister    Osteoporosis Half-Sister    Lupus Half-Sister    Lupus Half-Sister    Osteoporosis Half-Sister    Cancer - Lung Neg Hx    Colon cancer Neg Hx     Social History   Socioeconomic History   Marital status:  Married    Spouse name: Not on file   Number of children: 2   Years of education: Not on file   Highest education level: GED or equivalent  Occupational History   Occupation: Disability  Tobacco Use   Smoking status: Every Day    Current packs/day: 0.50    Average packs/day: 0.5 packs/day for 40.0 years (20.0 ttl pk-yrs)    Types: Cigarettes   Smokeless tobacco: Never   Tobacco comments:    Smokes half a pack a day for 40 years.   Vaping Use   Vaping status: Never Used  Substance and Sexual Activity   Alcohol use: Never   Drug use: Never   Sexual activity: Yes  Other Topics Concern   Not on file  Social History Narrative   Lives with her husband.    Social Drivers of Health   Financial Resource Strain: Medium Risk (03/19/2024)   Overall Financial Resource Strain (CARDIA)    Difficulty of Paying Living Expenses: Somewhat hard  Food Insecurity: Food Insecurity Present (03/19/2024)   Hunger Vital Sign    Worried About Running Out of  Food in the Last Year: Sometimes true    Ran Out of Food in the Last Year: Sometimes true  Transportation Needs: No Transportation Needs (03/19/2024)   PRAPARE - Administrator, Civil Service (Medical): No    Lack of Transportation (Non-Medical): No  Physical Activity: Unknown (03/19/2024)   Exercise Vital Sign    Days of Exercise per Week: Patient declined    Minutes of Exercise per Session: Not on file  Stress: Stress Concern Present (03/19/2024)   [employer] of Occupational Health - Occupational Stress Questionnaire    Feeling of Stress : To some extent  Social Connections: Unknown (03/19/2024)   Social Connection and Isolation Panel    Frequency of Communication with Friends and Family: Twice a week    Frequency of Social Gatherings with Friends and Family: Once a week    Attends Religious Services: Patient declined    Database administrator or Organizations: No    Attends Engineer, structural: Not on file     Marital Status: Married    Review of Systems: A 12 point ROS discussed and pertinent positives are indicated in the HPI above.  All other systems are negative.  Review of Systems  Vital Signs: BP 138/86   Pulse 80   Temp 98 F (36.7 C) (Oral)   Resp 16   Ht 5' 4.5 (1.638 m)   Wt 177 lb (80.3 kg)   LMP 06/08/2012   SpO2 98%   BMI 29.91 kg/m   Physical Exam HENT:     Mouth/Throat:     Mouth: Mucous membranes are moist.     Pharynx: Oropharynx is clear.   Cardiovascular:     Rate and Rhythm: Normal rate and regular rhythm.     Heart sounds: Normal heart sounds.  Pulmonary:     Effort: Pulmonary effort is normal. No respiratory distress.     Breath sounds: Normal breath sounds.   Skin:    General: Skin is warm and dry.   Neurological:     General: No focal deficit present.     Mental Status: She is alert and oriented to person, place, and time.   Psychiatric:        Mood and Affect: Mood normal.        Thought Content: Thought content normal.     Imaging: NM PET Image Restage (PS) Skull Base to Thigh (F-18 FDG) Result Date: 06/15/2024 CLINICAL DATA:  Subsequent treatment strategy for breast carcinoma. Skeletal metastasis. EXAM: NUCLEAR MEDICINE PET SKULL BASE TO THIGH TECHNIQUE: 8.8 mCi F-18 FDG was injected intravenously. Full-ring PET imaging was performed from the skull base to thigh after the radiotracer. CT data was obtained and used for attenuation correction and anatomic localization. Fasting blood glucose: 105 mg/dl COMPARISON:  PET scan 08/65/7846 FINDINGS: NECK: No hypermetabolic lymph nodes in the neck. Incidental CT findings: None. CHEST: Post mastectomy anatomy. Subpleural thickening in the LEFT upper lobe related to radiation treatment. No suspicious nodules. No hypermetabolic mediastinal lymph nodes. No hypermetabolic axillary lymph nodes. Incidental CT findings: ABDOMEN/PELVIS: No abnormal hypermetabolic activity within the liver, pancreas, adrenal glands,  or spleen. No hypermetabolic lymph nodes in the abdomen or pelvis. Incidental CT findings: None. SKELETON: Multiple new hypermetabolic skeletal metastasis within the pelvis, spine and ribs. For example new lesions in the RIGHT iliac bone with SUV max equal 6.7 on image 130. New lesion in the LEFT iliac bone on image 124 is also hypermetabolic. New lesions in the RIGHT  sacral ala with SUV max 78. Multiple new spinal lesions. Example lesion L4 SUV max 7.5 retrospect a subtle there on comparison exam. Previously described lesions at T7 and T9 again demonstrated. T9 lesion with SUV max equal 10.9 compared SUV max equal 7. RIGHT scapular lesion on image 54 Incidental CT findings: None. IMPRESSION: 1. 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 Previously 2 distinct thoracic spine lesions. 2. Stable lesions in the thoracic spine. 3. No evidence of visceral metastasis. Electronically Signed   By: Deboraha Fallow M.D.   On: 06/15/2024 14:56    Labs:  CBC: Recent Labs    03/10/24 1220 04/08/24 0940 05/06/24 1033 06/03/24 1022  WBC 5.6 5.6 5.7 9.2  HGB 14.3 14.8 14.6 15.3*  HCT 43.7 44.6 42.5 45.6  PLT 317 329 342 332    COAGS: No results for input(s): INR, APTT in the last 8760 hours.  BMP: Recent Labs    03/10/24 1220 04/08/24 0940 05/06/24 1033 06/03/24 1022  NA 140 139 138 138  K 3.7 3.8 4.0 3.7  CL 106 108 105 107  CO2 24 20* 22 23  GLUCOSE 94 106* 96 122*  BUN 14 15 14 13   CALCIUM 10.1 9.9 10.4* 9.8  CREATININE 0.81 0.78 0.71 0.81  GFRNONAA >60 >60 >60 >60    LIVER FUNCTION TESTS: Recent Labs    03/10/24 1220 04/08/24 0940 05/06/24 1033 06/03/24 1022  BILITOT 0.5 0.6 0.6 0.6  AST 27 28 28 31   ALT 25 24 28 31   ALKPHOS 58 59 64 68  PROT 7.4 7.3 7.2 7.7  ALBUMIN 4.0 3.9 4.0 4.1     Assessment and Plan: Metastatic breast cancer. For port placement Risks and  benefits of image guided port-a-catheter placement was discussed with the patient including, but not limited to bleeding, infection, pneumothorax, or fibrin sheath development and need for additional procedures.  All of the patient's questions were answered, patient is agreeable to proceed. Consent signed and in chart.    Electronically Signed: Prudence Brown, PA-C 06/17/2024, 10:10 AM   I spent a total of 20 minutes in face to face in clinical consultation, greater than 50% of which was counseling/coordinating care for port

## 2024-06-22 ENCOUNTER — Ambulatory Visit

## 2024-06-22 VITALS — BP 123/81 | HR 99 | Ht 64.0 in | Wt 180.1 lb

## 2024-06-22 DIAGNOSIS — I10 Essential (primary) hypertension: Secondary | ICD-10-CM | POA: Diagnosis not present

## 2024-06-22 DIAGNOSIS — E782 Mixed hyperlipidemia: Secondary | ICD-10-CM

## 2024-06-22 NOTE — Progress Notes (Unsigned)
 Established Patient Office Visit  Subjective   Patient ID: Wendy Zamora, female    DOB: 10/02/1961  Age: 63 y.o. MRN: [id number]  Chief Complaint  Patient presents with   Medical Management of Chronic Issues    3 month follow up    HPI  Patient Active Problem List   Diagnosis Date Noted   Genetic testing 01/20/2024   Constipation 01/15/2024   Bilateral swelling of feet 09/29/2023   Carpal tunnel syndrome of right wrist 06/16/2023   Insomnia 03/04/2023   Chronic musculoskeletal pain 03/04/2023   Annual physical exam 04/18/2022   Chest pain, musculoskeletal 04/18/2022   Abdominal pain 04/18/2022   Hyperlipidemia 04/18/2022   Dizziness 01/19/2022   Obesity (BMI 30-39.9) 01/19/2022   Vitamin B 12 deficiency 01/19/2022   Current every day smoker 01/19/2022   Lymphedema of arm 01/18/2022   Essential hypertension 01/18/2022   Asthma 01/18/2022   Migraine 01/18/2022   Allergies 01/18/2022   Osteoporosis 01/18/2022   GERD (gastroesophageal reflux disease) 12/27/2021   Dysphagia 12/27/2021   Primary malignant neoplasm of breast with metastasis (HCC) 07/06/2021   History of bilateral mastectomy 01/05/2020      ROS    Objective:     BP 123/81   Pulse 99   Ht 5' 4 (1.626 m)   Wt 180 lb 1.3 oz (81.7 kg)   LMP 06/08/2012   SpO2 96%   BMI 30.91 kg/m  BP Readings from Last 3 Encounters:  06/22/24 123/81  06/17/24 (!) 162/64  06/16/24 137/88   Wt Readings from Last 3 Encounters:  06/22/24 180 lb 1.3 oz (81.7 kg)  06/17/24 177 lb (80.3 kg)  06/16/24 177 lb 8 oz (80.5 kg)     Physical Exam Vitals and nursing note reviewed.  Constitutional:      Appearance: Normal appearance.   Eyes:     Extraocular Movements: Extraocular movements intact.     Pupils: Pupils are equal, round, and reactive to light.    Cardiovascular:     Rate and Rhythm: Normal rate and regular rhythm.  Pulmonary:     Effort: Pulmonary effort is normal.     Breath sounds: Normal  breath sounds.   Musculoskeletal:        General: Normal range of motion.     Cervical back: Normal range of motion and neck supple.   Neurological:     Mental Status: She is alert and oriented to person, place, and time.   Psychiatric:        Mood and Affect: Mood normal.        Thought Content: Thought content normal.      No results found for any visits on 06/22/24.    The 10-year ASCVD risk score (Arnett DK, et al., 2019) is: 10.9%    Assessment & Plan:   Problem List Items Addressed This Visit       Cardiovascular and Mediastinum   Essential hypertension - Primary   Remains adequately controlled on current antihypertensive regimen.  No medication changes are indicated today. No refills needed at this time.         Other   Hyperlipidemia   Lipid panel updated in June 2024.  Total cholesterol 225 and LDL 130.  Her 10-year ASCVD risk is 13.4%.  She prefers to focus on dietary changes in an effort to lower her cholesterol and hopes to avoid statin therapy.  She will return for fasting lipid panel.       Relevant Orders  Lipid Profile    Return in about 6 months (around 12/22/2024) for chronic follow-up with PCP.    Leita Longs, FNP

## 2024-06-23 ENCOUNTER — Other Ambulatory Visit: Payer: Self-pay

## 2024-06-23 NOTE — Assessment & Plan Note (Signed)
 Lipid panel updated in June 2024.  Total cholesterol 225 and LDL 130.  Her 10-year ASCVD risk is 13.4%.  She prefers to focus on dietary changes in an effort to lower her cholesterol and hopes to avoid statin therapy.  She will return for fasting lipid panel.

## 2024-06-23 NOTE — Assessment & Plan Note (Signed)
 Remains adequately controlled on current antihypertensive regimen.  No medication changes are indicated today. No refills needed at this time.

## 2024-06-25 ENCOUNTER — Other Ambulatory Visit: Payer: Self-pay | Admitting: Oncology

## 2024-06-25 ENCOUNTER — Telehealth: Payer: Self-pay

## 2024-06-25 MED ORDER — TRIAMCINOLONE 0.1 % CREAM:EUCERIN CREAM 1:1: 1.0000 | TOPICAL_CREAM | Freq: Three times a day (TID) | CUTANEOUS | 0 refills | Status: DC | PRN

## 2024-06-25 MED ORDER — TRIAMCINOLONE ACETONIDE 0.1 % EX CREA: 1.0000 | TOPICAL_CREAM | Freq: Two times a day (BID) | CUTANEOUS | 0 refills | Status: DC

## 2024-06-25 NOTE — Telephone Encounter (Signed)
 Patient called clinic today stating her right chest, shoulder, back of neck and shoulder is broke out in hives. She had her port placed on the 19th, this started shortly after that. Patient states she has tried all kinds of creams and benadryl . States nothing is working.   Informed Dr. Davonna, she will send something into her pharmacy, Nurse called pt to let her know and if she is not better to call us  back on Monday.

## 2024-06-29 NOTE — Progress Notes (Signed)
 Pharmacist Chemotherapy Monitoring - Initial Assessment    Anticipated start date: 07/07/24   The following has been reviewed per standard work regarding the patient's treatment regimen: The patient's diagnosis, treatment plan and drug doses, and organ/hematologic function Lab orders and baseline tests specific to treatment regimen  The treatment plan start date, drug sequencing, and pre-medications Prior authorization status  Patient's documented medication list, including drug-drug interaction screen and prescriptions for anti-emetics and supportive care specific to the treatment regimen The drug concentrations, fluid compatibility, administration routes, and timing of the medications to be used The patient's access for treatment and lifetime cumulative dose history, if applicable  The patient's medication allergies and previous infusion related reactions, if applicable   Changes made to treatment plan:  N/A  Discontinue diphenhydramine  from oncology treatment plan --> Add Quzyttir (cetirizine) 10 mg IVPush x 1 as premedication for oncology treatment plan.  T.O. Dr Theadore Zamora, PharmD   Follow up needed:  ECHO - scheduled 06/30/24   Wendy Zamora, [facility], 06/29/2024  4:19 PM

## 2024-06-30 ENCOUNTER — Ambulatory Visit (HOSPITAL_COMMUNITY)
Admission: RE | Admit: 2024-06-30 | Discharge: 2024-06-30 | Disposition: A | Discharge status: Home / Self Care | Source: Ambulatory Visit | Attending: Hematology | Admitting: Hematology

## 2024-06-30 DIAGNOSIS — C50919 Malignant neoplasm of unspecified site of unspecified female breast: Secondary | ICD-10-CM | POA: Diagnosis not present

## 2024-06-30 DIAGNOSIS — Z08 Encounter for follow-up examination after completed treatment for malignant neoplasm: Secondary | ICD-10-CM | POA: Insufficient documentation

## 2024-06-30 DIAGNOSIS — Z79899 Other long term (current) drug therapy: Secondary | ICD-10-CM | POA: Diagnosis not present

## 2024-06-30 LAB — ECHOCARDIOGRAM COMPLETE
AR max vel: 2.73 cm2
AV Area VTI: 2.59 cm2
AV Area mean vel: 2.61 cm2
AV Mean grad: 4 mmHg
AV Peak grad: 7.6 mmHg
Ao pk vel: 1.38 m/s
Area-P 1/2: 4.49 cm2
Calc EF: 67.5 %
S' Lateral: 2.4 cm
Single Plane A2C EF: 66.9 %
Single Plane A4C EF: 67.2 %

## 2024-06-30 NOTE — Progress Notes (Signed)
*  PRELIMINARY RESULTS* Echocardiogram 2D Echocardiogram has been performed.  Wendy Zamora 06/30/2024, 3:54 PM

## 2024-07-06 ENCOUNTER — Encounter: Payer: Self-pay | Admitting: Hematology

## 2024-07-06 NOTE — Progress Notes (Signed)
 [facility] [address] [place] [address]    Clinic Day:  07/07/2024  Referring physician: Bevely Doffing, FNP  Patient Care Team: Bevely Doffing, FNP as PCP - General (Family Medicine) Celestia Joesph SQUIBB, RN as Oncology Nurse Navigator (Oncology) Rogers Hai, MD as Medical Oncologist (Medical Oncology)   ASSESSMENT & PLAN:   Assessment: 1.  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 - Started on Ibrance  and Faslodex  in December 2021 for metastatic disease. - PET scan on 08/10/2021 with no evidence of hypermetabolic metastatic disease.  Widespread scattered sclerotic bone metastasis in the spine and pelvis are stable and not metabolically active.  No lymphadenopathy.  Asymmetric muscular uptake in the left lower scalene muscles and in the intercostal muscles of the ventral upper left chest wall without discrete mass correlate on the CT images, favoring activity related uptake. - Ibrance  was dose reduced to 100 mg 3 weeks on/1 week off on 10/11/2021 due to fatigue.  Discontinued on 07/23/2023 due to progression - Ribociclib  and exemestane  started on 08/07/2023- 10/20/23-- held for rash - PET scan 10/30/2023: Stable exam with persistent increased radiotracer uptake in the sclerotic T9 and T7 vertebral bodies, slightly better compared to the previous scan from July. - She could not tolerate exemestane  and ribociclib  secondary to hives and rash.  She could not tolerate anastrozole  due to hives. - Letrozole  2.5 mg daily started on 01/16/2024, discontinued after 1 dose due to irritation of the throat and tongue. - Guardant360 (01/23/2024): ESR1 D538G - Elacestrant  258 mg daily started on 02/23/2024, discontinued on 06/16/2024 due to progression. -  Guardant360 (06/16/2024): ESR1 D538G, MSI-high not detected.  HRD negative.  TMB 13.29 mut/MB. - Enhertu  started on 07/07/2024  2.  Social/family history: - She is a retired Engineer, structural.  She recently moved back from [place]. - Younger sister had breast cancer in her 95s.  Mother died of leukemia at age 41.  Paternal cousin also had breast cancer.  3.  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  She underwent immediate implants which were later removed on 12/10/2019 secondary to infection and necrosis. - XRT to the chest wall and regional lymph nodes, 60 Gray, from 02/29/2020 through 04/12/2020 - She was treated with adjuvant anastrozole  which was started 12/09/2019 and was found to have metastatic disease in November 2021. - Previous genetic testing was reportedly negative and [place].    Plan: 1.  Metastatic breast cancer to the bones, ER/PR positive, HER2 negative: - PET scan (06/10/2021): Progression of bone metastatic disease with multiple new lesions in the spine and pelvis.  No visible metastatic disease. - At last visit, we have discontinued elacestrant . - I have reviewed echocardiogram results.  I have also reviewed Guardant360 results. - Labs today: Normal LFTs and creatinine.  CBC grossly normal. - She will start her cycle 1 of Enhertu  today.  We discussed side effects in detail including but not limited to nausea/vomiting, hair loss, pneumonitis, decrease  in ejection fraction among others.  She will proceed with full dose today.  RTC 3 weeks for follow-up.  2.  Peripheral neuropathy: - She  has tingling/numbness in the hands and feet along with burning sensation which is stable.  Continue Lyrica  25 mg twice daily.  3.  Bone metastatic disease: - Continue calcium 1200 mg plus vitamin D .  Calcium level is normal at 9.2.  Will change her denosumab  to every 6 weeks.  4.  Leg swellings: - Continue Lasix  daily as needed.  5.  Cancer related pains: - She had worsening pains in the back and hip region. - At last visit we have started her on hydrocodone  10/325 as needed which is helping control the pain.  6.  High risk drug monitoring: - Reviewed 2D echocardiogram from 06/30/2024: EF 55 to 60%.    No orders of the defined types were placed in this encounter.    LILLETTE Verneta SAUNDERS Teague,acting as a Neurosurgeon for Alean Stands, MD.,have documented all relevant documentation on the behalf of Alean Stands, MD,as directed by  Alean Stands, MD while in the presence of Alean Stands, MD.  I, Alean Stands MD, have reviewed the above documentation for accuracy and completeness, and I agree with the above.      Alean Stands, MD   7/9/20251:02 PM  CHIEF COMPLAINT:   Diagnosis: metastatic breast cancer    Cancer Staging  No matching staging information was found for the patient.    Prior Therapy: 1. Palbociclib  and fulvestrant , 11/2020 - 07/23/23 2. Ribociclib  every 4 weeks, 08/07/23 - 10/20/23, held for rash  Current Therapy: Elacestrant    HISTORY OF PRESENT ILLNESS:   Oncology History  Primary malignant neoplasm of breast with metastasis (HCC)  04/28/2019 Genetic Testing   Negative genetic testing on the common hereditary cancer panel through [facility].  The report date is April 28, 2019.  The Common Hereditary Gene Panel offered by [facility] includes sequencing and/or deletion duplication testing of the following 47 genes: APC, ATM, AXIN2, BARD1, BMPR1A, BRCA1, BRCA2, BRIP1, CDH1, CDK4, CDKN2A (p14ARF), CDKN2A (p16INK4a), CHEK2, CTNNA1, DICER1,  EPCAM (Deletion/duplication testing only), GREM1 (promoter region deletion/duplication testing only), HOXB13, KIT, MEN1, MLH1, MSH2, MSH3, MSH6, MUTYH, NBN, NF1, NHTL1, PALB2, PDGFRA, PMS2, POLD1, POLE, PTEN, RAD50, RAD51C, RAD51D, SDHB, SDHC, SDHD, SMAD4, SMARCA4. STK11, TP53, TSC1, TSC2, and VHL.  The following genes were evaluated for sequence changes only: SDHA.    07/06/2021 Initial Diagnosis   Metastatic breast cancer (HCC)   07/18/2021 - 08/15/2022 Chemotherapy   Patient is on Treatment Plan : BREAST Palbociclib  + Fulvestrant  q28d     07/18/2021 - 01/14/2024 Chemotherapy   Patient is on Treatment Plan : BREAST Palbociclib  D1-21 + Fulvestrant  q28d     07/07/2024 -  Chemotherapy   Patient is on Treatment Plan : BREAST Fam-Trastuzumab Deruxtecan-nxki  (Enhertu ) (5.4) q21d        INTERVAL HISTORY:   Valena is a 63 y.o. female presenting to clinic today for follow up of metastatic breast cancer. She was last seen by me on 06/16/24.  Today, she states that she is doing well overall. Her appetite level is at 70%. Her energy level is at 70%.  PAST MEDICAL HISTORY:   Past Medical History: Past Medical History:  Diagnosis Date   Allergy    Asthma    Asthma    diagnosed age 62   Bone metastasis 08/2020   was on prolia    Breast cancer (HCC)    started chemo 05/05/2019. finished 08/2019  Gangrene (HCC)    GERD (gastroesophageal reflux disease)    Hx of bilateral mastectomy 10/22/2019   Hypertension    Hypertension    started bp meds 2008   Lymphedema of arm    left arm   Osteopenia    Shingles 04/20/2020   left side    Surgical History: Past Surgical History:  Procedure Laterality Date   BIOPSY  01/22/2022   Procedure: BIOPSY;  Surgeon: Eartha Angelia Sieving, MD;  Location: [facility];  Service: Gastroenterology;;   ORIN MEDIATE RELEASE Bilateral 2006   ESOPHAGOGASTRODUODENOSCOPY (EGD) WITH PROPOFOL  N/A 01/22/2022   Procedure: ESOPHAGOGASTRODUODENOSCOPY (EGD) WITH PROPOFOL ;   Surgeon: Eartha Angelia Sieving, MD;  Location: [facility];  Service: Gastroenterology;  Laterality: N/A;  205   hysteroscopy     age 12   IR IMAGING GUIDED PORT INSERTION  06/17/2024   mastectomy Bilateral 10/22/2019   pt had necrosis after 1st surgery, had another surgery to remove implants 12/10/2019   MOUTH SURGERY     TUBAL LIGATION     TUBAL LIGATION Bilateral 1998   WISDOM TOOTH EXTRACTION     early 25's    Social History: Social History   Socioeconomic History   Marital status: Married    Spouse name: Not on file   Number of children: 2   Years of education: Not on file   Highest education level: GED or equivalent  Occupational History   Occupation: Disability  Tobacco Use   Smoking status: Every Day    Current packs/day: 0.50    Average packs/day: 0.5 packs/day for 40.0 years (20.0 ttl pk-yrs)    Types: Cigarettes   Smokeless tobacco: Never   Tobacco comments:    Smokes half a pack a day for 40 years.   Vaping Use   Vaping status: Never Used  Substance and Sexual Activity   Alcohol use: Never   Drug use: Never   Sexual activity: Yes  Other Topics Concern   Not on file  Social History Narrative   Lives with her husband.    Social Drivers of Health   Financial Resource Strain: Medium Risk (06/18/2024)   Overall Financial Resource Strain (CARDIA)    Difficulty of Paying Living Expenses: Somewhat hard  Food Insecurity: Food Insecurity Present (06/18/2024)   Hunger Vital Sign    Worried About Running Out of Food in the Last Year: Sometimes true    Ran Out of Food in the Last Year: Not on file  Transportation Needs: No Transportation Needs (06/18/2024)   PRAPARE - Administrator, Civil Service (Medical): No    Lack of Transportation (Non-Medical): No  Physical Activity: Unknown (03/19/2024)   Exercise Vital Sign    Days of Exercise per Week: Patient declined    Minutes of Exercise per Session: Not on file  Stress: Stress Concern Present  (06/18/2024)   [employer] of Occupational Health - Occupational Stress Questionnaire    Feeling of Stress: To some extent  Social Connections: Unknown (06/18/2024)   Social Connection and Isolation Panel    Frequency of Communication with Friends and Family: Three times a week    Frequency of Social Gatherings with Friends and Family: Once a week    Attends Religious Services: Not on file    Active Member of Clubs or Organizations: No    Attends Banker Meetings: Not on file    Marital Status: Married  Intimate Partner Violence: Not At Risk (03/04/2022)   Received from  [facility] ([facility])   Humiliation, Afraid, Rape, and Kick questionnaire    Within the last year, have you been afraid of your partner or ex-partner?: No    Within the last year, have you been humiliated or emotionally abused in other ways by your partner or ex-partner?: No    Within the last year, have you been kicked, hit, slapped, or otherwise physically hurt by your partner or ex-partner?: No    Within the last year, have you been raped or forced to have any kind of sexual activity by your partner or ex-partner?: No    Family History: Family History  Problem Relation Age of Onset   Leukemia Mother    Breast cancer Sister    Diabetes Maternal Uncle    Hyperlipidemia Maternal Uncle    Diabetes Mellitus II Maternal Grandmother    Heart disease Maternal Grandfather    Diabetes Maternal Grandfather    Breast cancer Cousin    Breast cancer Half-Sister    Osteoporosis Half-Sister    Lupus Half-Sister    Lupus Half-Sister    Osteoporosis Half-Sister    Cancer - Lung Neg Hx    Colon cancer Neg Hx     Current Medications:  Current Outpatient Medications:    amLODipine  (NORVASC ) 5 MG tablet, Take 1 tablet by mouth once daily, Disp: 90 tablet, Rfl: 0   Ascorbic Acid (VITAMIN C) 1000 MG tablet, Take 1,000 mg by mouth daily., Disp: , Rfl:    aspirin  81 MG EC tablet, Take 81 mg by mouth  daily., Disp: , Rfl:    betamethasone  dipropionate 0.05 % cream, Apply topically 2 (two) times daily., Disp: 30 g, Rfl: 1   Calcium-Magnesium-Vitamin D  (CALCIUM 1200+D3 PO), Take 1 tablet by mouth daily., Disp: , Rfl:    cetirizine -pseudoephedrine (ZYRTEC -D) 5-120 MG tablet, Take 1 tablet by mouth daily., Disp: , Rfl:    CINNAMON PO, Take 1,000 mg by mouth daily., Disp: , Rfl:    cyanocobalamin  1000 MCG tablet, Take 1 tablet (1,000 mcg total) by mouth daily., Disp: 30 tablet, Rfl: 6   cyclobenzaprine  (FLEXERIL ) 10 MG tablet, Take 1 tablet (10 mg total) by mouth 2 (two) times daily as needed for muscle spasms., Disp: 60 tablet, Rfl: 5   denosumab  (XGEVA ) 120 MG/1.7ML SOLN injection, Inject 120 mg into the skin every 30 (thirty) days., Disp: , Rfl:    dexamethasone  0.5 mg/5 mL - diphenhydrAMINE  12.5 mg/5 mL oral solution 2:1 mixture, Take 10 mLs by mouth 2 (two) times daily., Disp: 480 mL, Rfl: 2   furosemide  (LASIX ) 20 MG tablet, Take 1 tablet (20 mg total) by mouth daily as needed for edema (Take daily as needed for edema)., Disp: 30 tablet, Rfl: 2   HYDROcodone -acetaminophen  (NORCO/VICODIN) 5-325 MG tablet, Take 1 tablet by mouth every 8 (eight) hours as needed for moderate pain (pain score 4-6)., Disp: 90 tablet, Rfl: 0   lansoprazole  (PREVACID ) 30 MG capsule, TAKE 1 CAPSULE BY MOUTH IN THE MORNING AND AT BEDTIME, Disp: 120 capsule, Rfl: 3   lidocaine -prilocaine  (EMLA ) cream, Apply 1 Application topically as needed (Apply to port site prior to port access)., Disp: 30 g, Rfl: 6   Misc. Devices MISC, Please provide patient with left arm compression sleeve. Diagnosis metastatic breast cancer, bilateral mastectomy, left arm lymphedema, Disp: 1 each, Rfl: 6   Misc. Devices MISC, Please provide with prosthetic mastectomy bra, Disp: 1 each, Rfl: 6   NAPHCON-A 0.025-0.3 % ophthalmic solution, Place 2 drops into both  eyes in the morning and at bedtime., Disp: , Rfl:    pregabalin  (LYRICA ) 25 MG capsule,  Take 1 capsule by mouth twice daily, Disp: 60 capsule, Rfl: 3   prochlorperazine  (COMPAZINE ) 10 MG tablet, Take 1 tablet (10 mg total) by mouth every 6 (six) hours as needed for nausea or vomiting., Disp: 60 tablet, Rfl: 2   Triamcinolone  Acetonide (TRIAMCINOLONE  0.1 % CREAM : EUCERIN) CREA, Apply 1 Application topically 3 (three) times daily as needed., Disp: 1 each, Rfl: 0   triamcinolone  cream (KENALOG ) 0.1 %, Apply 1 Application topically 2 (two) times daily., Disp: 30 g, Rfl: 0 No current facility-administered medications for this visit.  Facility-Administered Medications Ordered in Other Visits:    denosumab  (XGEVA ) injection 120 mg, 120 mg, Subcutaneous, Once, Kempton Milne, MD   dextrose  5 % solution, , Intravenous, Continuous, Rogers Hai, MD, Last Rate: 10 mL/hr at 07/07/24 1058, New Bag at 07/07/24 1058   fam-trastuzumab deruxtecan-nxki  (ENHERTU ) 400 mg in dextrose  5 % 100 mL chemo infusion, 5.4 mg/kg (Treatment Plan Recorded), Intravenous, Once, Rogers Hai, MD, Last Rate: 80 mL/hr at 07/07/24 1212, 400 mg at 07/07/24 1212   heparin  lock flush 100 unit/mL, 500 Units, Intracatheter, Once PRN, Paxson Harrower, MD   sodium chloride  flush (NS) 0.9 % injection 10 mL, 10 mL, Intracatheter, PRN, Daisja Kessinger, MD   Allergies: Allergies  Allergen Reactions   Augmentin [Amoxicillin-Pot Clavulanate] Anaphylaxis   Ciprofloxacin Anaphylaxis   Claritin [Loratadine] Anaphylaxis   Gabapentin Anaphylaxis   Keflex [Cephalexin] Anaphylaxis   Levofloxacin Anaphylaxis   Lisinopril Swelling   Lisinopril Anaphylaxis   Metoprolol Swelling   Metoprolol Anaphylaxis   Nexium [Esomeprazole] Anaphylaxis   Sulfa Antibiotics Hives and Swelling   Tetracyclines & Related Anaphylaxis   Iohexol  Hives    Needs pre meds   Ultram  [Tramadol ] Other (See Comments)    Patient states medication irritates throat    Anastrozole  Hives   Ceftin [Cefuroxime Axetil] Hives    Chromium Other (See Comments)    headache   Exemestane  Hives   Iodinated Contrast Media Hives   Kisqali  (200 Mg Dose) [Ribociclib  Succ (200 Mg Dose)] Hives   Misc. Sulfonamide Containing Compounds Other (See Comments)   Oxycodone Itching   Sulfa Antibiotics     Possible Reaction    Latex Rash    itching   Nickel Rash    itching    REVIEW OF SYSTEMS:   Review of Systems  Constitutional:  Negative for chills, fatigue and fever.  HENT:   Negative for lump/mass, mouth sores, nosebleeds, sore throat and trouble swallowing.   Eyes:  Negative for eye problems.  Respiratory:  Positive for cough and shortness of breath.   Cardiovascular:  Negative for chest pain, leg swelling and palpitations.  Gastrointestinal:  Positive for constipation. Negative for abdominal pain, diarrhea, nausea and vomiting.  Genitourinary:  Negative for bladder incontinence, difficulty urinating, dysuria, frequency, hematuria and nocturia.   Musculoskeletal:  Negative for arthralgias, back pain, flank pain, myalgias and neck pain.  Skin:  Negative for itching and rash.  Neurological:  Positive for headaches. Negative for dizziness and numbness.  Hematological:  Does not bruise/bleed easily.  Psychiatric/Behavioral:  Negative for depression, sleep disturbance and suicidal ideas. The patient is not nervous/anxious.   All other systems reviewed and are negative.    VITALS:   Last menstrual period 06/08/2012.  Wt Readings from Last 3 Encounters:  07/07/24 175 lb (79.4 kg)  06/22/24 180 lb 1.3 oz (81.7 kg)  06/17/24  177 lb (80.3 kg)    There is no height or weight on file to calculate BMI.  Performance status (ECOG): 1 - Symptomatic but completely ambulatory  PHYSICAL EXAM:   Physical Exam Vitals and nursing note reviewed. Exam conducted with a chaperone present.  Constitutional:      Appearance: Normal appearance.  Cardiovascular:     Rate and Rhythm: Normal rate and regular rhythm.     Pulses: Normal  pulses.     Heart sounds: Normal heart sounds.  Pulmonary:     Effort: Pulmonary effort is normal.     Breath sounds: Normal breath sounds.  Abdominal:     Palpations: Abdomen is soft. There is no hepatomegaly, splenomegaly or mass.     Tenderness: There is no abdominal tenderness.  Musculoskeletal:     Right lower leg: No edema.     Left lower leg: No edema.  Lymphadenopathy:     Cervical: No cervical adenopathy.     Right cervical: No superficial, deep or posterior cervical adenopathy.    Left cervical: No superficial, deep or posterior cervical adenopathy.     Upper Body:     Right upper body: No supraclavicular or axillary adenopathy.     Left upper body: No supraclavicular or axillary adenopathy.  Neurological:     General: No focal deficit present.     Mental Status: She is alert and oriented to person, place, and time.  Psychiatric:        Mood and Affect: Mood normal.        Behavior: Behavior normal.     LABS:   CBC     Component Value Date/Time   WBC 6.1 07/07/2024 0921   RBC 4.69 07/07/2024 0921   HGB 14.2 07/07/2024 0921   HCT 42.7 07/07/2024 0921   PLT 326 07/07/2024 0921   MCV 91.0 07/07/2024 0921   MCH 30.3 07/07/2024 0921   MCHC 33.3 07/07/2024 0921   RDW 12.9 07/07/2024 0921   LYMPHSABS 1.6 07/07/2024 0921   MONOABS 0.5 07/07/2024 0921   EOSABS 0.1 07/07/2024 0921   BASOSABS 0.0 07/07/2024 0921    CMP      Component Value Date/Time   NA 138 07/07/2024 0921   K 3.6 07/07/2024 0921   CL 108 07/07/2024 0921   CO2 22 07/07/2024 0921   GLUCOSE 120 (H) 07/07/2024 0921   BUN 12 07/07/2024 0921   CREATININE 0.71 07/07/2024 0921   CALCIUM 9.2 07/07/2024 0921   PROT 7.0 07/07/2024 0921   ALBUMIN 3.7 07/07/2024 0921   AST 28 07/07/2024 0921   ALT 35 07/07/2024 0921   ALKPHOS 65 07/07/2024 0921   BILITOT 0.6 07/07/2024 0921   GFRNONAA >60 07/07/2024 0921   GFRAA >90 06/08/2012 1251     No results found for: CEA1, CEA / No results found  for: CEA1, CEA No results found for: PSA1 No results found for: CAN199 No results found for: CAN125  No results found for: TOTALPROTELP, ALBUMINELP, A1GS, A2GS, BETS, BETA2SER, GAMS, MSPIKE, SPEI No results found for: TIBC, FERRITIN, IRONPCTSAT No results found for: LDH   STUDIES:   ECHOCARDIOGRAM COMPLETE Result Date: 06/30/2024    ECHOCARDIOGRAM REPORT   Patient Name:   Wendy Zamora Date of Exam: 06/30/2024 Medical Rec #:  [id number]       Height:       64.0 in Accession #:    [id number]      Weight:       180.1 lb Date  of Birth:  July 07, 1961       BSA:          1.871 m Patient Age:    63 years        BP:           124/80 mmHg Patient Gender: F               HR:           79 bpm. Exam Location:  Inpatient Procedure: 2D Echo, Color Doppler and Cardiac Doppler (Both Spectral and Color            Flow Doppler were utilized during procedure). Indications:    Chemo Z09  History:        Patient has no prior history of Echocardiogram examinations.  Sonographer:    Benard Stallion Referring Phys: [id number] Suad Autrey IMPRESSIONS  1. Left ventricular ejection fraction, by estimation, is 55 to 60%. The left ventricle has normal function. The left ventricle has no regional wall motion abnormalities. Left ventricular diastolic parameters were normal.  2. Right ventricular systolic function is normal. The right ventricular size is normal.  3. The mitral valve is normal in structure. No evidence of mitral valve regurgitation. No evidence of mitral stenosis.  4. The aortic valve is normal in structure. Aortic valve regurgitation is not visualized. No aortic stenosis is present.  5. The inferior vena cava is normal in size with greater than 50% respiratory variability, suggesting right atrial pressure of 3 mmHg. FINDINGS  Left Ventricle: Left ventricular ejection fraction, by estimation, is 55 to 60%. The left ventricle has normal function. The left ventricle has no regional  wall motion abnormalities. The left ventricular internal cavity size was normal in size. There is  no left ventricular hypertrophy. Left ventricular diastolic parameters were normal. Right Ventricle: The right ventricular size is normal. No increase in right ventricular wall thickness. Right ventricular systolic function is normal. Left Atrium: Left atrial size was normal in size. Right Atrium: Right atrial size was normal in size. Pericardium: There is no evidence of pericardial effusion. Mitral Valve: The mitral valve is normal in structure. No evidence of mitral valve regurgitation. No evidence of mitral valve stenosis. Tricuspid Valve: The tricuspid valve is normal in structure. Tricuspid valve regurgitation is not demonstrated. No evidence of tricuspid stenosis. Aortic Valve: The aortic valve is normal in structure. Aortic valve regurgitation is not visualized. No aortic stenosis is present. Aortic valve mean gradient measures 4.0 mmHg. Aortic valve peak gradient measures 7.6 mmHg. Aortic valve area, by VTI measures 2.59 cm. Pulmonic Valve: The pulmonic valve was normal in structure. Pulmonic valve regurgitation is not visualized. No evidence of pulmonic stenosis. Aorta: The aortic root is normal in size and structure. Venous: The inferior vena cava is normal in size with greater than 50% respiratory variability, suggesting right atrial pressure of 3 mmHg. IAS/Shunts: No atrial level shunt detected by color flow Doppler.  LEFT VENTRICLE PLAX 2D LVIDd:         3.70 cm     Diastology LVIDs:         2.40 cm     LV e' medial:    12.00 cm/s LV PW:         1.10 cm     LV E/e' medial:  8.2 LV IVS:        1.10 cm     LV e' lateral:   10.90 cm/s LVOT diam:     2.00 cm  LV E/e' lateral: 9.1 LV SV:         72 LV SV Index:   39 LVOT Area:     3.14 cm  LV Volumes (MOD) LV vol d, MOD A2C: 51.9 ml LV vol d, MOD A4C: 47.8 ml LV vol s, MOD A2C: 17.2 ml LV vol s, MOD A4C: 15.7 ml LV SV MOD A2C:     34.7 ml LV SV MOD A4C:      47.8 ml LV SV MOD BP:      34.1 ml RIGHT VENTRICLE RV Basal diam:  3.10 cm RV Mid diam:    2.70 cm RV S prime:     10.20 cm/s TAPSE (M-mode): 1.8 cm LEFT ATRIUM             Index        RIGHT ATRIUM          Index LA diam:        2.90 cm 1.55 cm/m   RA Area:     8.61 cm LA Vol (A2C):   24.8 ml 13.25 ml/m  RA Volume:   14.80 ml 7.91 ml/m LA Vol (A4C):   23.7 ml 12.67 ml/m LA Biplane Vol: 24.7 ml 13.20 ml/m  AORTIC VALVE AV Area (Vmax):    2.73 cm AV Area (Vmean):   2.61 cm AV Area (VTI):     2.59 cm AV Vmax:           138.00 cm/s AV Vmean:          93.700 cm/s AV VTI:            0.279 m AV Peak Grad:      7.6 mmHg AV Mean Grad:      4.0 mmHg LVOT Vmax:         120.00 cm/s LVOT Vmean:        77.800 cm/s LVOT VTI:          0.230 m LVOT/AV VTI ratio: 0.82  AORTA Ao Root diam: 2.80 cm Ao Asc diam:  3.50 cm MITRAL VALVE MV Area (PHT): 4.49 cm    SHUNTS MV Decel Time: 169 msec    Systemic VTI:  0.23 m MV E velocity: 98.80 cm/s  Systemic Diam: 2.00 cm MV A velocity: 75.80 cm/s MV E/A ratio:  1.30 Aditya Sabharwal Electronically signed by Ria Commander Signature Date/Time: 06/30/2024/4:47:53 PM    Final    IR IMAGING GUIDED PORT INSERTION Result Date: 06/17/2024 CLINICAL DATA:  Recurrent metastatic breast carcinoma and need for porta cath for additional chemotherapy. EXAM: IMPLANTED PORT A CATH PLACEMENT WITH ULTRASOUND AND FLUOROSCOPIC GUIDANCE ANESTHESIA/SEDATION: Moderate (conscious) sedation was employed during this procedure. A total of Versed  1.0 mg and Fentanyl  100 mcg was administered intravenously. Moderate Sedation Time: 35 minutes. The patient's level of consciousness and vital signs were monitored continuously by radiology nursing throughout the procedure under my direct supervision. FLUOROSCOPY: Radiation Exposure Index: 1.3 mGy Kerma PROCEDURE: The procedure, risks, benefits, and alternatives were explained to the patient. Questions regarding the procedure were encouraged and answered. The  patient understands and consents to the procedure. A time-out was performed prior to initiating the procedure. Ultrasound was utilized to confirm patency of the right internal jugular vein. An ultrasound image was saved and recorded. The right neck and chest were prepped with chlorhexidine in a sterile fashion, and a sterile drape was applied covering the operative field. Maximum barrier sterile technique with sterile gowns and gloves were used for the procedure.  Local anesthesia was provided with 1% lidocaine . After creating a small venotomy incision, a 21 gauge needle was advanced into the right internal jugular vein under direct, real-time ultrasound guidance. Ultrasound image documentation was performed. After securing guidewire access, an 8 Fr dilator was placed. A J-wire was kinked to measure appropriate catheter length. A subcutaneous port pocket was then created along the upper chest wall utilizing sharp and blunt dissection. Portable cautery was utilized. The pocket was irrigated with sterile saline. A single lumen power injectable port was chosen for placement. The 8 Fr catheter was tunneled from the port pocket site to the venotomy incision. The port was placed in the pocket. External catheter was trimmed to appropriate length based on guidewire measurement. At the venotomy, an 8 Fr peel-away sheath was placed over a guidewire. The catheter was then placed through the sheath and the sheath removed. Final catheter positioning was confirmed and documented with a fluoroscopic spot image. The port was accessed with a needle and aspirated and flushed with heparinized saline. The access needle was removed. The venotomy and port pocket incisions were closed with subcutaneous 3-0 Monocryl and subcuticular 4-0 Vicryl. Dermabond was applied to both incisions. COMPLICATIONS: COMPLICATIONS None FINDINGS: After catheter placement, the tip lies at the cavo-atrial junction. The catheter aspirates normally and is ready  for immediate use. IMPRESSION: Placement of single lumen port a cath via right internal jugular vein. The catheter tip lies at the cavo-atrial junction. A power injectable port a cath was placed and is ready for immediate use. Electronically Signed   By: Marcey Moan M.D.   On: 06/17/2024 11:59   NM PET Image Restage (PS) Skull Base to Thigh (F-18 FDG) Result Date: 06/15/2024 CLINICAL DATA:  Subsequent treatment strategy for breast carcinoma. Skeletal metastasis. EXAM: NUCLEAR MEDICINE PET SKULL BASE TO THIGH TECHNIQUE: 8.8 mCi F-18 FDG was injected intravenously. Full-ring PET imaging was performed from the skull base to thigh after the radiotracer. CT data was obtained and used for attenuation correction and anatomic localization. Fasting blood glucose: 105 mg/dl COMPARISON:  PET scan 92/81/7975 FINDINGS: NECK: No hypermetabolic lymph nodes in the neck. Incidental CT findings: None. CHEST: Post mastectomy anatomy. Subpleural thickening in the LEFT upper lobe related to radiation treatment. No suspicious nodules. No hypermetabolic mediastinal lymph nodes. No hypermetabolic axillary lymph nodes. Incidental CT findings: ABDOMEN/PELVIS: No abnormal hypermetabolic activity within the liver, pancreas, adrenal glands, or spleen. No hypermetabolic lymph nodes in the abdomen or pelvis. Incidental CT findings: None. SKELETON: Multiple new hypermetabolic skeletal metastasis within the pelvis, spine and ribs. For example new lesions in the RIGHT iliac bone with SUV max equal 6.7 on image 130. New lesion in the LEFT iliac bone on image 124 is also hypermetabolic. New lesions in the RIGHT sacral ala with SUV max 78. Multiple new spinal lesions. Example lesion L4 SUV max 7.5 retrospect a subtle there on comparison exam. Previously described lesions at T7 and T9 again demonstrated. T9 lesion with SUV max equal 10.9 compared SUV max equal 7. RIGHT scapular lesion on image 54 Incidental CT findings: None. IMPRESSION: 1.  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 Previously 2 distinct thoracic spine lesions. 2. Stable lesions in the thoracic spine. 3. No evidence of visceral metastasis. Electronically Signed   By: Jackquline Boxer M.D.   On: 06/15/2024 14:56

## 2024-07-07 ENCOUNTER — Inpatient Hospital Stay

## 2024-07-07 ENCOUNTER — Ambulatory Visit

## 2024-07-07 ENCOUNTER — Ambulatory Visit: Admitting: Hematology

## 2024-07-07 ENCOUNTER — Inpatient Hospital Stay: Attending: Hematology

## 2024-07-07 ENCOUNTER — Other Ambulatory Visit

## 2024-07-07 ENCOUNTER — Inpatient Hospital Stay: Admitting: Hematology

## 2024-07-07 VITALS — BP 118/68 | HR 75 | Temp 98.0°F | Resp 18 | Wt 175.0 lb

## 2024-07-07 DIAGNOSIS — G893 Neoplasm related pain (acute) (chronic): Secondary | ICD-10-CM | POA: Insufficient documentation

## 2024-07-07 DIAGNOSIS — Z9013 Acquired absence of bilateral breasts and nipples: Secondary | ICD-10-CM | POA: Diagnosis not present

## 2024-07-07 DIAGNOSIS — C50912 Malignant neoplasm of unspecified site of left female breast: Secondary | ICD-10-CM | POA: Diagnosis present

## 2024-07-07 DIAGNOSIS — Z170 Estrogen receptor positive status [ER+]: Secondary | ICD-10-CM | POA: Diagnosis not present

## 2024-07-07 DIAGNOSIS — C50919 Malignant neoplasm of unspecified site of unspecified female breast: Secondary | ICD-10-CM

## 2024-07-07 DIAGNOSIS — Z1721 Progesterone receptor positive status: Secondary | ICD-10-CM | POA: Diagnosis not present

## 2024-07-07 DIAGNOSIS — Z5112 Encounter for antineoplastic immunotherapy: Secondary | ICD-10-CM | POA: Diagnosis present

## 2024-07-07 DIAGNOSIS — Z79811 Long term (current) use of aromatase inhibitors: Secondary | ICD-10-CM | POA: Insufficient documentation

## 2024-07-07 DIAGNOSIS — Z79899 Other long term (current) drug therapy: Secondary | ICD-10-CM | POA: Insufficient documentation

## 2024-07-07 DIAGNOSIS — Z7982 Long term (current) use of aspirin: Secondary | ICD-10-CM | POA: Insufficient documentation

## 2024-07-07 DIAGNOSIS — Z1732 Human epidermal growth factor receptor 2 negative status: Secondary | ICD-10-CM | POA: Insufficient documentation

## 2024-07-07 DIAGNOSIS — F17210 Nicotine dependence, cigarettes, uncomplicated: Secondary | ICD-10-CM | POA: Diagnosis not present

## 2024-07-07 DIAGNOSIS — C7951 Secondary malignant neoplasm of bone: Secondary | ICD-10-CM | POA: Insufficient documentation

## 2024-07-07 DIAGNOSIS — M810 Age-related osteoporosis without current pathological fracture: Secondary | ICD-10-CM

## 2024-07-07 LAB — CBC WITH DIFFERENTIAL/PLATELET
Abs Immature Granulocytes: 0.02 K/uL (ref 0.00–0.07)
Basophils Absolute: 0 K/uL (ref 0.0–0.1)
Basophils Relative: 1 %
Eosinophils Absolute: 0.1 K/uL (ref 0.0–0.5)
Eosinophils Relative: 2 %
HCT: 42.7 % (ref 36.0–46.0)
Hemoglobin: 14.2 g/dL (ref 12.0–15.0)
Immature Granulocytes: 0 %
Lymphocytes Relative: 26 %
Lymphs Abs: 1.6 K/uL (ref 0.7–4.0)
MCH: 30.3 pg (ref 26.0–34.0)
MCHC: 33.3 g/dL (ref 30.0–36.0)
MCV: 91 fL (ref 80.0–100.0)
Monocytes Absolute: 0.5 K/uL (ref 0.1–1.0)
Monocytes Relative: 8 %
Neutro Abs: 3.9 K/uL (ref 1.7–7.7)
Neutrophils Relative %: 63 %
Platelets: 326 K/uL (ref 150–400)
RBC: 4.69 MIL/uL (ref 3.87–5.11)
RDW: 12.9 % (ref 11.5–15.5)
WBC: 6.1 K/uL (ref 4.0–10.5)
nRBC: 0 % (ref 0.0–0.2)

## 2024-07-07 LAB — COMPREHENSIVE METABOLIC PANEL WITH GFR
ALT: 35 U/L (ref 0–44)
AST: 28 U/L (ref 15–41)
Albumin: 3.7 g/dL (ref 3.5–5.0)
Alkaline Phosphatase: 65 U/L (ref 38–126)
Anion gap: 8 (ref 5–15)
BUN: 12 mg/dL (ref 8–23)
CO2: 22 mmol/L (ref 22–32)
Calcium: 9.2 mg/dL (ref 8.9–10.3)
Chloride: 108 mmol/L (ref 98–111)
Creatinine, Ser: 0.71 mg/dL (ref 0.44–1.00)
GFR, Estimated: >60 mL/min (ref >60)
Glucose, Bld: 120 mg/dL — ABNORMAL HIGH (ref 70–99)
Potassium: 3.6 mmol/L (ref 3.5–5.1)
Sodium: 138 mmol/L (ref 135–145)
Total Bilirubin: 0.6 mg/dL (ref 0.0–1.2)
Total Protein: 7 g/dL (ref 6.5–8.1)

## 2024-07-07 LAB — MAGNESIUM: Magnesium: 2 mg/dL (ref 1.7–2.4)

## 2024-07-07 MED ORDER — ACETAMINOPHEN 325 MG PO TABS
650.0000 mg | ORAL_TABLET | Freq: Once | ORAL | Status: AC
  Administered 2024-07-07: 650 mg via ORAL
  Filled 2024-07-07: qty 2

## 2024-07-07 MED ORDER — DEXAMETHASONE SODIUM PHOSPHATE 10 MG/ML IJ SOLN
10.0000 mg | Freq: Once | INTRAMUSCULAR | Status: AC
  Administered 2024-07-07: 10 mg via INTRAVENOUS
  Filled 2024-07-07: qty 1

## 2024-07-07 MED ORDER — PALONOSETRON HCL INJECTION 0.25 MG/5ML
0.2500 mg | Freq: Once | INTRAVENOUS | Status: AC
  Administered 2024-07-07: 0.25 mg via INTRAVENOUS
  Filled 2024-07-07: qty 5

## 2024-07-07 MED ORDER — DENOSUMAB 120 MG/1.7ML ~~LOC~~ SOLN
120.0000 mg | Freq: Once | SUBCUTANEOUS | Status: AC
  Administered 2024-07-07: 120 mg via SUBCUTANEOUS
  Filled 2024-07-07: qty 1.7

## 2024-07-07 MED ORDER — SODIUM CHLORIDE 0.9 % IV SOLN
150.0000 mg | Freq: Once | INTRAVENOUS | Status: AC
  Administered 2024-07-07: 150 mg via INTRAVENOUS
  Filled 2024-07-07: qty 150

## 2024-07-07 MED ORDER — SODIUM CHLORIDE 0.9% FLUSH
10.0000 mL | Freq: Once | INTRAVENOUS | Status: AC
  Administered 2024-07-07: 10 mL via INTRAVENOUS

## 2024-07-07 MED ORDER — CETIRIZINE HCL 10 MG/ML IV SOLN
10.0000 mg | Freq: Once | INTRAVENOUS | Status: AC
  Administered 2024-07-07: 10 mg via INTRAVENOUS
  Filled 2024-07-07: qty 1

## 2024-07-07 MED ORDER — FAM-TRASTUZUMAB DERUXTECAN-NXKI CHEMO 100 MG IV SOLR
5.4000 mg/kg | Freq: Once | INTRAVENOUS | Status: AC
  Administered 2024-07-07: 400 mg via INTRAVENOUS
  Filled 2024-07-07: qty 20

## 2024-07-07 MED ORDER — HEPARIN SOD (PORK) LOCK FLUSH 100 UNIT/ML IV SOLN
500.0000 [IU] | Freq: Once | INTRAVENOUS | Status: AC | PRN
  Administered 2024-07-07: 500 [IU]

## 2024-07-07 MED ORDER — SODIUM CHLORIDE 0.9% FLUSH
10.0000 mL | INTRAVENOUS | Status: DC | PRN
  Administered 2024-07-07: 10 mL

## 2024-07-07 MED ORDER — DEXTROSE 5 % IV SOLN
INTRAVENOUS | Status: DC
Start: 2024-07-07 — End: 2024-07-07

## 2024-07-07 NOTE — Progress Notes (Signed)
 Patient has been examined by Dr. Ellin Saba. Vital signs and labs have been reviewed by MD - ANC, Creatinine, LFTs, hemoglobin, and platelets are within treatment parameters per M.D. - pt may proceed with treatment.  Primary RN and pharmacy notified.

## 2024-07-07 NOTE — Patient Instructions (Signed)

## 2024-07-07 NOTE — Progress Notes (Signed)
 Patient presents today for C1D1 Enhertu  infusion and Xgeva  injection. Patient is in satisfactory condition with no new complaints voiced.  Vital signs are stable.  Labs reviewed by Dr. Rogers during the office visit and all labs are within treatment parameters.  We will proceed with treatment per MD orders. Patient will receive Xgeva  every 6 weeks per Dr.Katragadda.   Patient denies any tooth or jaw pain. No recent or future dental appointments at this time. Patient reports taking Calcium/Vit D supplements  Treatment given today per MD orders. Tolerated infusion without adverse affects. Vital signs stable. No complaints at this time. Discharged from clinic ambulatory in stable condition. Alert and oriented x 3. F/U with [facility] as scheduled.

## 2024-07-07 NOTE — Patient Instructions (Signed)
 [facility] - A DEPT OF [facility]  Discharge Instructions: Thank you for choosing [facility] to provide your oncology and hematology care.  If you have a lab appointment with the Cancer Center - please note that after April 8th, 2024, all labs will be drawn in the cancer center.  You do not have to check in or register with the main entrance as you have in the past but will complete your check-in in the cancer center.  Wear comfortable clothing and clothing appropriate for easy access to any Portacath or PICC line.   We strive to give you quality time with your provider. You may need to reschedule your appointment if you arrive late (15 or more minutes).  Arriving late affects you and other patients whose appointments are after yours.  Also, if you miss three or more appointments without notifying the office, you may be dismissed from the clinic at the provider's discretion.      For prescription refill requests, have your pharmacy contact our office and allow 72 hours for refills to be completed.    Today you received the following chemotherapy and/or immunotherapy agents Enhertu  and Xgeva  120 mg   To help prevent nausea and vomiting after your treatment, we encourage you to take your nausea medication as directed.  Fam-Trastuzumab  Deruxtecan Injection What is this medication? FAM-TRASTUZUMAB  DERUXTECAN (fam-tras TOOZ eu mab DER ux TEE kan) treats some types of cancer. It works by blocking a protein that causes cancer cells to grow and multiply. This helps to slow or stop the spread of cancer cells. This medicine may be used for other purposes; ask your health care provider or pharmacist if you have questions. COMMON BRAND NAME(S): ENHERTU  What should I tell my care team before I take this medication? They need to know if you have any of these conditions: Heart disease Heart failure Infection, especially a viral infection, such as chickenpox,  cold sores, or herpes Liver disease Lung or breathing disease, such as asthma or COPD An unusual or allergic reaction to fam-trastuzumab  deruxtecan, other medications, foods, dyes, or preservatives Pregnant or trying to get pregnant Breast-feeding How should I use this medication? This medication is injected into a vein. It is given by your care team in a hospital or clinic setting. A special MedGuide will be given to you before each treatment. Be sure to read this information carefully each time. Talk to your care team about the use of this medication in children. Special care may be needed. Overdosage: If you think you have taken too much of this medicine contact a poison control center or emergency room at once. NOTE: This medicine is only for you. Do not share this medicine with others. What if I miss a dose? It is important not to miss your dose. Call your care team if you are unable to keep an appointment. What may interact with this medication? Interactions are not expected. This list may not describe all possible interactions. Give your health care provider a list of all the medicines, herbs, non-prescription drugs, or dietary supplements you use. Also tell them if you smoke, drink alcohol, or use illegal drugs. Some items may interact with your medicine. What should I watch for while using this medication? Visit your care team for regular checks on your progress. Tell your care team if your symptoms do not start to get better or if they get worse. This medication may increase your risk of  getting an infection. Call your care team for advice if you get a fever, chills, sore throat, or other symptoms of a cold or flu. Do not treat yourself. Try to avoid being around people who are sick. Avoid taking medications that contain aspirin , acetaminophen , ibuprofen, naproxen, or ketoprofen unless instructed by your care team. These medications may hide a fever. Be careful brushing or flossing  your teeth or using a toothpick because you may get an infection or bleed more easily. If you have any dental work done, tell your dentist you are receiving this medication. This medication may cause dry eyes and blurred vision. If you wear contact lenses, you may feel some discomfort. Lubricating eye drops may help. See your care team if the problem does not go away or is severe. Talk to your care team if you may be pregnant. Serious birth defects can occur if you take this medication during pregnancy and for 7 months after the last dose. If your partner can get pregnant, use a condom during sex while taking this medication and for 4 months after the last dose. Do not breastfeed while taking this medication and for 7 months after the last dose. This medication may cause infertility. Talk to your care team if you are concerned about your fertility. What side effects may I notice from receiving this medication? Side effects that you should report to your care team as soon as possible: Allergic reactions--skin rash, itching, hives, swelling of the face, lips, tongue, or throat Dry cough, shortness of breath or trouble breathing Infection--fever, chills, cough, sore throat, wounds that don't heal, pain or trouble when passing urine, general feeling of discomfort or being unwell Heart failure--shortness of breath, swelling of the ankles, feet, or hands, sudden weight gain, unusual weakness or fatigue Unusual bruising or bleeding Side effects that usually do not require medical attention (report these to your care team if they continue or are bothersome): Constipation Diarrhea Hair loss Muscle pain Nausea Vomiting This list may not describe all possible side effects. Call your doctor for medical advice about side effects. You may report side effects to FDA at 1-800-FDA-1088. Where should I keep my medication? This medication is given in a hospital or clinic. It will not be stored at home. NOTE:  This sheet is a summary. It may not cover all possible information. If you have questions about this medicine, talk to your doctor, pharmacist, or health care provider.  2024 Elsevier/Gold Standard (2023-08-15 00:00:00)  BELOW ARE SYMPTOMS THAT SHOULD BE REPORTED IMMEDIATELY: *FEVER GREATER THAN 100.4 F (38 C) OR HIGHER *CHILLS OR SWEATING *NAUSEA AND VOMITING THAT IS NOT CONTROLLED WITH YOUR NAUSEA MEDICATION *UNUSUAL SHORTNESS OF BREATH *UNUSUAL BRUISING OR BLEEDING *URINARY PROBLEMS (pain or burning when urinating, or frequent urination) *BOWEL PROBLEMS (unusual diarrhea, constipation, pain near the anus) TENDERNESS IN MOUTH AND THROAT WITH OR WITHOUT PRESENCE OF ULCERS (sore throat, sores in mouth, or a toothache) UNUSUAL RASH, SWELLING OR PAIN  UNUSUAL VAGINAL DISCHARGE OR ITCHING   Items with * indicate a potential emergency and should be followed up as soon as possible or go to the Emergency Department if any problems should occur.  Please show the CHEMOTHERAPY ALERT CARD or IMMUNOTHERAPY ALERT CARD at check-in to the Emergency Department and triage nurse.  Should you have questions after your visit or need to cancel or reschedule your appointment, please contact [facility]  - A DEPT OF [facility] [phone]  and follow the prompts.  Office hours are 8:00 a.m. to 4:30 p.m. Monday - Friday. Please note that voicemails left after 4:00 p.m. may not be returned until the following business day.  We are closed weekends and major holidays. You have access to a nurse at all times for urgent questions. Please call the main number to the clinic [phone] and follow the prompts.  For any non-urgent questions, you may also contact your provider using MyChart. We now offer e-Visits for anyone 68 and older to request care online for non-urgent symptoms. For details visit [email or web address].   Also download the MyChart app! Go to the app store, search  MyChart, open the app, select [facility], and log in with your MyChart username and password.

## 2024-07-08 ENCOUNTER — Telehealth: Payer: Self-pay

## 2024-07-08 LAB — CANCER ANTIGEN 27.29: CA 27.29: 70.1 U/mL — ABNORMAL HIGH (ref 0.0–38.6)

## 2024-07-08 LAB — CANCER ANTIGEN 15-3: CA 15-3: 47.1 U/mL — ABNORMAL HIGH (ref 0.0–25.0)

## 2024-07-08 NOTE — Telephone Encounter (Signed)
 Patient called for 24-hour follow-up patient reports getting nauseous last night, she took at nausea pill and felt better. Patient made aware to call cancer center and let us  know if she needed anything.

## 2024-07-09 NOTE — Progress Notes (Signed)
 Patient called complaining of hiccups and burping since last chemo treatment.   Spoke with Mickiel Dry, MD and she wants patient to try taking compazine  since she is already taking prevacid  at the highest dose and see if that helps.  Patient aware and is agreeable with plan and will try the compazine  to see if she can get some relief.

## 2024-07-10 ENCOUNTER — Other Ambulatory Visit (INDEPENDENT_AMBULATORY_CARE_PROVIDER_SITE_OTHER): Payer: Self-pay | Admitting: Gastroenterology

## 2024-07-10 DIAGNOSIS — K219 Gastro-esophageal reflux disease without esophagitis: Secondary | ICD-10-CM

## 2024-07-10 DIAGNOSIS — R1310 Dysphagia, unspecified: Secondary | ICD-10-CM

## 2024-07-13 ENCOUNTER — Encounter (INDEPENDENT_AMBULATORY_CARE_PROVIDER_SITE_OTHER): Payer: Self-pay | Admitting: Gastroenterology

## 2024-07-13 ENCOUNTER — Ambulatory Visit (INDEPENDENT_AMBULATORY_CARE_PROVIDER_SITE_OTHER): Payer: Medicaid Other | Admitting: Gastroenterology

## 2024-07-13 VITALS — BP 119/69 | HR 84 | Temp 97.5°F | Ht 64.5 in | Wt 179.4 lb

## 2024-07-13 DIAGNOSIS — Z860100 Personal history of colon polyps, unspecified: Secondary | ICD-10-CM | POA: Diagnosis not present

## 2024-07-13 DIAGNOSIS — R1310 Dysphagia, unspecified: Secondary | ICD-10-CM | POA: Diagnosis not present

## 2024-07-13 DIAGNOSIS — K5909 Other constipation: Secondary | ICD-10-CM | POA: Diagnosis not present

## 2024-07-13 DIAGNOSIS — K219 Gastro-esophageal reflux disease without esophagitis: Secondary | ICD-10-CM | POA: Diagnosis not present

## 2024-07-13 DIAGNOSIS — K5900 Constipation, unspecified: Secondary | ICD-10-CM

## 2024-07-13 NOTE — Progress Notes (Signed)
 Referring Provider: Melvenia Manus BRAVO, MD Primary Care Physician:  Bevely Doffing, FNP Primary GI Physician: Dr. Eartha   Chief Complaint  Patient presents with   Follow-up    Patient here today for a follow up on Dysphagia. Patient says she is still having issues with this. She is currently taking Lansoprazole  30 mg bid.    HPI:   Wendy Zamora is a 63 y.o. female with past medical history of metastatic breast cancer s/p bilateral mastectomy, radiation therapy and currently about to restart chemotherapy   Patient presenting today for:  Follow up of dysphagia, GERD and constipation  Last seen January, at that time having dysphagia for the past few months with liquids and foods. Having heartburn frequently on prevacid  BID a few times per week , does better on days she takes her PPI BID. Has not tried famotidine. Having some constipation, eating cheerios which seem to help. Tried metamucil and miralax without improvement.   Recommended continue prevacid  30mg  BID, consider famotidine if having breakthrough with PPI BID dosing, schedule EGD, start benefiber, increased water, fruits, veggie intake  Present: States recently had port placed to start chemotherapy as her bone mets had increased on recent imaging, previously on chemo pill. Still with some dysphagia at times, she is trying to be very careful with swallowing. She feels she needs to hold off on EGD at this time given her upcoming chemotherapy. She is taking prevacid  30mg  BID, but notes still having symptoms of heartburn and acid regurgitation almost daily. She is not taking anything for breakthrough symptoms. She did not tolerate nexium in the past. Pepcid did not provide any results in the past either. She has occasional epigastric discomfort as well as some rib pain but she wonders if this is related to her bone mets as she has more lesions noted on recent imaging.   Still with some issues with constipation. She is not taking  anything for this as she has tried metamucil and miralax in the past without good results. She states she has learned that certain foods like corn/wheat cereals will help. She is having a BM on average about  3 times per week. Sometimes has to strain but she tries to avoid doing this due to her hemorrhoids. She is trying to drink plenty of water. No rectal bleeding or melena.   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  modified barium swallow that showed decreased muscle contraction but there was no official report about this.  Last EGD: 01/22/22 - No endoscopic esophageal abnormality to explain patient's dysphagia. Dilated. Biopsied. - 2 cm hiatal hernia. - Erosive gastropathy with no stigmata of recent bleeding. Biopsied. - Normal examined duodenum. Last Colonoscopy: 11/03/2020 cecal and rectal polyp between 3 mm to 1 cm in size removed with a hot snare. No pathology available (had been recommended to repeat in 5 years)    Path A. STOMACH, BIOPSY:  - Gastric antral mucosa with nonspecific reactive gastropathy  - Gastric oxyntic mucosa with parietal cell hyperplasia as can be seen  in hypergastrinemic states such as PPI therapy.  - Helicobacter pylori-like organisms are not identified on routine HE  stain  B. ESOPHAGUS, DISTAL, BIOPSY:  - Esophageal squamous mucosa with no specific histopathologic changes  - Negative for increased intraepithelial eosinophils C. ESOPHAGUS,  MID, BIOPSY:  - Esophageal squamous mucosa with no specific histopathologic changes  - Negative for increased intraepithelial eosinophils   Filed Weights   07/13/24 1140  Weight: 179 lb 6.4 oz (81.4 kg)     Past Medical History:  Diagnosis Date   Allergy    Asthma    Asthma     diagnosed age 71   Bone metastasis 08/2020   was on prolia    Breast cancer (HCC)    started chemo 05/05/2019. finished 08/2019   Gangrene (HCC)    GERD (gastroesophageal reflux disease)    Hx of bilateral mastectomy 10/22/2019   Hypertension    Hypertension    started bp meds 2008   Lymphedema of arm    left arm   Osteopenia    Shingles 04/20/2020   left side    Past Surgical History:  Procedure Laterality Date   BIOPSY  01/22/2022   Procedure: BIOPSY;  Surgeon: Eartha Angelia Sieving, MD;  [facility];  Service: Gastroenterology;;   ORIN MEDIATE RELEASE Bilateral 2006   ESOPHAGOGASTRODUODENOSCOPY (EGD) WITH PROPOFOL  N/A 01/22/2022   Procedure: ESOPHAGOGASTRODUODENOSCOPY (EGD) WITH PROPOFOL ;  Surgeon: Eartha Angelia Sieving, MD;  Location: [facility];  Service: Gastroenterology;  Laterality: N/A;  205   hysteroscopy     age 79   IR IMAGING GUIDED PORT INSERTION  06/17/2024   mastectomy Bilateral 10/22/2019   pt had necrosis after 1st surgery, had another surgery to remove implants 12/10/2019   MOUTH SURGERY     TUBAL LIGATION     TUBAL LIGATION Bilateral 1998   WISDOM TOOTH EXTRACTION     early 30's    Current Outpatient Medications  Medication Sig Dispense Refill   amLODipine  (NORVASC ) 5 MG tablet Take 1 tablet by mouth once daily 90 tablet 0   Ascorbic Acid (VITAMIN C) 1000 MG tablet Take 1,000 mg by mouth daily.     aspirin  81 MG EC tablet Take 81 mg by mouth daily.     betamethasone  dipropionate 0.05 % cream Apply topically 2 (two) times daily. 30 g 1   Calcium-Magnesium-Vitamin D  (CALCIUM 1200+D3 PO) Take 1 tablet by mouth daily.     cetirizine -pseudoephedrine (ZYRTEC -D) 5-120 MG tablet Take 1 tablet by mouth daily.     CINNAMON PO Take 1,000 mg by mouth daily.     cyanocobalamin  1000 MCG tablet Take 1 tablet (1,000 mcg total) by mouth daily. (Patient taking differently: Take 1,000 mcg by mouth every other day.) 30 tablet 6   cyclobenzaprine   (FLEXERIL ) 10 MG tablet Take 1 tablet (10 mg total) by mouth 2 (two) times daily as needed for muscle spasms. 60 tablet 5   denosumab  (XGEVA ) 120 MG/1.7ML SOLN injection Inject 120 mg into the skin every 30 (thirty) days.     furosemide  (LASIX ) 20 MG tablet Take 1 tablet (20 mg total) by mouth daily as needed for edema (Take daily as needed for edema). 30 tablet 2   HYDROcodone -acetaminophen  (NORCO/VICODIN) 5-325 MG tablet Take 1 tablet by mouth every 8 (eight) hours as needed for moderate pain (pain score 4-6). 90 tablet 0   lansoprazole  (PREVACID ) 30 MG capsule TAKE 1 CAPSULE BY MOUTH IN THE MORNING AND AT BEDTIME 30 capsule 0   lidocaine -prilocaine  (EMLA ) cream Apply 1 Application topically as needed (Apply to port site prior to port access). 30 g 6   Misc. Devices MISC Please provide patient with left arm compression sleeve. Diagnosis metastatic breast cancer, bilateral mastectomy, left arm lymphedema 1 each  6   Misc. Devices MISC Please provide with prosthetic mastectomy bra 1 each 6   NAPHCON-A 0.025-0.3 % ophthalmic solution Place 2 drops into both eyes in the morning and at bedtime.     pregabalin  (LYRICA ) 25 MG capsule Take 1 capsule by mouth twice daily (Patient taking differently: Take 25 mg by mouth 2 (two) times daily. As needed.) 60 capsule 3   prochlorperazine  (COMPAZINE ) 10 MG tablet Take 1 tablet (10 mg total) by mouth every 6 (six) hours as needed for nausea or vomiting. 60 tablet 2   Triamcinolone  Acetonide (TRIAMCINOLONE  0.1 % CREAM : EUCERIN) CREA Apply 1 Application topically 3 (three) times daily as needed. 1 each 0   triamcinolone  cream (KENALOG ) 0.1 % Apply 1 Application topically 2 (two) times daily. 30 g 0   No current facility-administered medications for this visit.    Allergies as of 07/13/2024 - Review Complete 07/13/2024  Allergen Reaction Noted   Augmentin [amoxicillin-pot clavulanate] Anaphylaxis 07/03/2021   Ciprofloxacin Anaphylaxis 07/03/2021   Claritin  [loratadine] Anaphylaxis 07/03/2021   Gabapentin Anaphylaxis 07/03/2021   Keflex [cephalexin] Anaphylaxis 07/03/2021   Levofloxacin Anaphylaxis 07/03/2021   Lisinopril Swelling 06/08/2012   Lisinopril Anaphylaxis 07/03/2021   Metoprolol Swelling 06/08/2012   Metoprolol Anaphylaxis 07/03/2021   Nexium [esomeprazole] Anaphylaxis 07/03/2021   Sulfa antibiotics Hives and Swelling 06/08/2012   Tetracyclines & related Anaphylaxis 07/03/2021   Iohexol  Hives 09/16/2022   Ultram  [tramadol ] Other (See Comments) 03/18/2023   Anastrozole  Hives 01/14/2024   Ceftin [cefuroxime axetil] Hives 06/08/2012   Chromium Other (See Comments) 07/03/2021   Exemestane  Hives 01/14/2024   Iodinated contrast media Hives 03/04/2023   Kisqali  (200 mg dose) [ribociclib  succ (200 mg dose)] Hives 01/14/2024   Misc. sulfonamide containing compounds Other (See Comments) 10/22/2022   Oxycodone Itching 07/03/2021   Sulfa antibiotics  04/03/2015   Latex Rash 07/03/2021   Nickel Rash 07/03/2021    Social History   Socioeconomic History   Marital status: Married    Spouse name: Not on file   Number of children: 2   Years of education: Not on file   Highest education level: GED or equivalent  Occupational History   Occupation: Disability  Tobacco Use   Smoking status: Every Day    Current packs/day: 0.50    Average packs/day: 0.5 packs/day for 40.0 years (20.0 ttl pk-yrs)    Types: Cigarettes   Smokeless tobacco: Never   Tobacco comments:    Smokes half a pack a day for 40 years.   Vaping Use   Vaping status: Never Used  Substance and Sexual Activity   Alcohol use: Never   Drug use: Never   Sexual activity: Yes  Other Topics Concern   Not on file  Social History Narrative   Lives with her husband.    Social Drivers of Health   Financial Resource Strain: Medium Risk (06/18/2024)   Overall Financial Resource Strain (CARDIA)    Difficulty of Paying Living Expenses: Somewhat hard  Food Insecurity: Food  Insecurity Present (06/18/2024)   Hunger Vital Sign    Worried About Running Out of Food in the Last Year: Sometimes true    Ran Out of Food in the Last Year: Not on file  Transportation Needs: No Transportation Needs (06/18/2024)   PRAPARE - Administrator, Civil Service (Medical): No    Lack of Transportation (Non-Medical): No  Physical Activity: Unknown (03/19/2024)   Exercise Vital Sign    Days of Exercise per Week: Patient  declined    Minutes of Exercise per Session: Not on file  Stress: Stress Concern Present (06/18/2024)   [employer] of Occupational Health - Occupational Stress Questionnaire    Feeling of Stress: To some extent  Social Connections: Unknown (06/18/2024)   Social Connection and Isolation Panel    Frequency of Communication with Friends and Family: Three times a week    Frequency of Social Gatherings with Friends and Family: Once a week    Attends Religious Services: Not on Marketing executive or Organizations: No    Attends Engineer, structural: Not on file    Marital Status: Married    Review of systems General: negative for malaise, night sweats, fever, chills, weight los Neck: Negative for lumps, goiter, pain and significant neck swelling Resp: Negative for cough, wheezing, dyspnea at rest CV: Negative for chest pain, leg swelling, palpitations, orthopnea GI: denies melena, hematochezia, nausea, vomiting, diarrhea, odyonophagia, early satiety or unintentional weight loss. +constipation +GERD +dysphagia  MSK: Negative for joint pain or swelling, back pain, and muscle pain. Derm: Negative for itching or rash Psych: Denies depression, anxiety, memory loss, confusion. No homicidal or suicidal ideation.  Heme: Negative for prolonged bleeding, bruising easily, and swollen nodes. Endocrine: Negative for cold or heat intolerance, polyuria, polydipsia and goiter. Neuro: negative for tremor, gait imbalance, syncope and seizures. The  remainder of the review of systems is noncontributory.  Physical Exam: BP 119/69 (BP Location: Right Arm, Patient Position: Sitting, Cuff Size: Large)   Pulse 84   Temp (!) 97.5 F (36.4 C) (Temporal)   Ht 5' 4.5 (1.638 m)   Wt 179 lb 6.4 oz (81.4 kg)   LMP 06/08/2012   BMI 30.32 kg/m  General:   Alert and oriented. No distress noted. Pleasant and cooperative.  Head:  Normocephalic and atraumatic. Eyes:  Conjuctiva clear without scleral icterus. Mouth:  Oral mucosa pink and moist. Good dentition. No lesions. Heart: Normal rate and rhythm, s1 and s2 heart sounds present.  Lungs: Clear lung sounds in all lobes. Respirations equal and unlabored. Abdomen:  +BS, soft, non-tender and non-distended. No rebound or guarding. No HSM or masses noted. Derm: No palmar erythema or jaundice Msk:  Symmetrical without gross deformities. Normal posture. Extremities:  Without edema. Neurologic:  Alert and  oriented x4 Psych:  Alert and cooperative. Normal mood and affect.  Invalid input(s): 6 MONTHS   ASSESSMENT: Wendy Zamora is a 63 y.o. female presenting today for follow up of GERD, dysphagia, Constipation  GERD: Still having breakthrough symptoms multiple times per week on prevacid  30mg  BID. She has declined change in PPI therapy at this time due to concern for any side effects, especially with upcoming chemotherapy. States pepcid did not provide any results in the past. I did discuss importance of good reflux control to avoid potential adverse effects.  I did encourage her to be mindful of good reflux precautions. Will continue with prevacid  30mg  BID for now, if she wishes to trial another PPI, which I encouraged her to do, she will make me aware.    Dysphagia: Endoflip in November as outlined above with EGJOO.  Still with some issues with dysphagia, foods sticking in mid throat/chest though no worsening of symptoms.  Recommended proceeding with upper endoscopy for further evaluation at last  visit though due to other health issues she deferred this, she is now having to restart IV chemotherapy due to worsening of bone mets. We again discussed proceeding with  EGD, given recent endoflip concerning for EGJOO and ongoing dysphagia. She does not wish to proceed with EGD at this time. I recommend she let me know if dysphagia worsens or if she decides she wants to pursue EGD, should continue with good chewing precautions, avoid eating drier, thicker foods as well.   Constipation:  chronic since being chemotherapy a few years back.  Recent CMP and TSH were within normal limits.  No rectal bleeding, melena, weight loss, abdominal pain.  usually can eat high-fiber/roughage, increase water intake which helps her to move her bowels.  she has failed Metamucil and MiraLAX in the past. Does not wish to start any prescription laxatives at this time due to upcoming chemotherapy. Should continue with good water intake, high fiber diet. She can let me know if she changes in her mind about other constipation therapies.  History of colonic polyps: last TCS done at outside facility in 2021, polyps found, though no path report available. Pt reported she was told to repeat in 5 years, will be due in November 2026.   PLAN:  -continue prevacid  30mg  BID for now -pt to make me aware if she wishes to trial another PPI -Good reflux/chewing precautions -Increase water intake, aim for atleast 64 oz per day -Increase fruits, veggies and whole grains, kiwi and prunes are especially good for constipation -pt to make me aware of when she is ready to schedule EGD  -repeat TCS November 2026  All questions were answered, patient verbalized understanding and is in agreement with plan as outlined above.   Follow Up: 4 months  Wendy Borelli L. Jaimes Eckert, MSN, APRN, AGNP-C Adult-Gerontology Nurse Practitioner [facility]  I have reviewed the note and agree with the APP's assessment as described in this  progress note  If presenting persistent symptoms, may consider scheduling EGD with Bravo placement with Dr. Cinderella while on PPI.  She may undergo repeat empiric dilation with a Savary at that time.  Toribio Fortune, MD Gastroenterology and Hepatology [facility]

## 2024-07-13 NOTE — Patient Instructions (Signed)
 We can continue with prevacid  30mg  twice daily for now Please let me know if you wish to try a different acid reflux medication, as we discussed You can also let me know if you wish to proceed with EGD for your swallowing Continue to take small bites, chew well, take sips of liquid between bites, avoid drier, thicker foods Increase water intake, aim for atleast 64 oz per day Increase fruits, veggies and whole grains, kiwi and prunes are especially good for constipation  Follow up 4 months  It was a pleasure to see you today. I want to create trusting relationships with patients and provide genuine, compassionate, and quality care. I truly value your feedback! please be on the lookout for a survey regarding your visit with me today. I appreciate your input about our visit and your time in completing this!    Danita Proud L. Muskan Bolla, MSN, APRN, AGNP-C Adult-Gerontology Nurse Practitioner [facility] at [facility]

## 2024-07-15 ENCOUNTER — Telehealth (INDEPENDENT_AMBULATORY_CARE_PROVIDER_SITE_OTHER): Payer: Self-pay | Admitting: Gastroenterology

## 2024-07-15 ENCOUNTER — Other Ambulatory Visit (INDEPENDENT_AMBULATORY_CARE_PROVIDER_SITE_OTHER): Payer: Self-pay | Admitting: Gastroenterology

## 2024-07-15 DIAGNOSIS — K219 Gastro-esophageal reflux disease without esophagitis: Secondary | ICD-10-CM

## 2024-07-15 DIAGNOSIS — R1310 Dysphagia, unspecified: Secondary | ICD-10-CM

## 2024-07-15 MED ORDER — LANSOPRAZOLE 30 MG PO CPDR: 30.0000 mg | DELAYED_RELEASE_CAPSULE | Freq: Two times a day (BID) | ORAL | 3 refills | Status: AC

## 2024-07-15 NOTE — Telephone Encounter (Signed)
 Pt came to front desk. She had seen [facility] on Tuesday. She said she is taking Lansoprazole  twice a day, but the prescription said to take once a day. She is asking for a 30 day supply for twice a day. She uses [facility]. [phone]

## 2024-07-15 NOTE — Telephone Encounter (Signed)
 Chelsea, can we please send in one bid for this patient # [phone] her script says one bid # 30. Please advise.

## 2024-07-16 NOTE — Telephone Encounter (Signed)
 I spoke with the patient and made her aware we have sent in #[phone] with 3 refills. Patient states understanding.

## 2024-07-28 ENCOUNTER — Inpatient Hospital Stay: Admitting: Hematology

## 2024-07-28 ENCOUNTER — Inpatient Hospital Stay

## 2024-07-28 VITALS — BP 111/68 | HR 63 | Temp 98.1°F | Resp 19 | Wt 179.2 lb

## 2024-07-28 DIAGNOSIS — C50919 Malignant neoplasm of unspecified site of unspecified female breast: Secondary | ICD-10-CM

## 2024-07-28 DIAGNOSIS — Z5112 Encounter for antineoplastic immunotherapy: Secondary | ICD-10-CM | POA: Diagnosis not present

## 2024-07-28 LAB — CBC WITH DIFFERENTIAL/PLATELET
Abs Immature Granulocytes: 0.01 K/uL (ref 0.00–0.07)
Basophils Absolute: 0.1 K/uL (ref 0.0–0.1)
Basophils Relative: 2 %
Eosinophils Absolute: 0.2 K/uL (ref 0.0–0.5)
Eosinophils Relative: 4 %
HCT: 40 % (ref 36.0–46.0)
Hemoglobin: 13.6 g/dL (ref 12.0–15.0)
Immature Granulocytes: 0 %
Lymphocytes Relative: 34 %
Lymphs Abs: 1.6 K/uL (ref 0.7–4.0)
MCH: 31.1 pg (ref 26.0–34.0)
MCHC: 34 g/dL (ref 30.0–36.0)
MCV: 91.5 fL (ref 80.0–100.0)
Monocytes Absolute: 0.5 K/uL (ref 0.1–1.0)
Monocytes Relative: 11 %
Neutro Abs: 2.3 K/uL (ref 1.7–7.7)
Neutrophils Relative %: 49 %
Platelets: 414 K/uL — ABNORMAL HIGH (ref 150–400)
RBC: 4.37 MIL/uL (ref 3.87–5.11)
RDW: 13.3 % (ref 11.5–15.5)
WBC: 4.7 K/uL (ref 4.0–10.5)
nRBC: 0 % (ref 0.0–0.2)

## 2024-07-28 LAB — COMPREHENSIVE METABOLIC PANEL WITH GFR
ALT: 32 U/L (ref 0–44)
AST: 28 U/L (ref 15–41)
Albumin: 3.7 g/dL (ref 3.5–5.0)
Alkaline Phosphatase: 79 U/L (ref 38–126)
Anion gap: 11 (ref 5–15)
BUN: 16 mg/dL (ref 8–23)
CO2: 22 mmol/L (ref 22–32)
Calcium: 9.4 mg/dL (ref 8.9–10.3)
Chloride: 107 mmol/L (ref 98–111)
Creatinine, Ser: 0.75 mg/dL (ref 0.44–1.00)
GFR, Estimated: >60 mL/min (ref >60)
Glucose, Bld: 111 mg/dL — ABNORMAL HIGH (ref 70–99)
Potassium: 3.8 mmol/L (ref 3.5–5.1)
Sodium: 140 mmol/L (ref 135–145)
Total Bilirubin: 0.6 mg/dL (ref 0.0–1.2)
Total Protein: 6.9 g/dL (ref 6.5–8.1)

## 2024-07-28 LAB — MAGNESIUM: Magnesium: 2.1 mg/dL (ref 1.7–2.4)

## 2024-07-28 MED ORDER — DEXAMETHASONE SODIUM PHOSPHATE 10 MG/ML IJ SOLN
10.0000 mg | Freq: Once | INTRAMUSCULAR | Status: AC
  Administered 2024-07-28: 10 mg via INTRAVENOUS
  Filled 2024-07-28: qty 1

## 2024-07-28 MED ORDER — FAM-TRASTUZUMAB DERUXTECAN-NXKI CHEMO 100 MG IV SOLR
5.4000 mg/kg | Freq: Once | INTRAVENOUS | Status: AC
  Administered 2024-07-28: 400 mg via INTRAVENOUS
  Filled 2024-07-28: qty 20

## 2024-07-28 MED ORDER — HEPARIN SOD (PORK) LOCK FLUSH 100 UNIT/ML IV SOLN
500.0000 [IU] | Freq: Once | INTRAVENOUS | Status: AC | PRN
Start: 2024-07-28 — End: 2024-07-28
  Administered 2024-07-28: 500 [IU]

## 2024-07-28 MED ORDER — SODIUM CHLORIDE 0.9 % IV SOLN
150.0000 mg | Freq: Once | INTRAVENOUS | Status: AC
  Administered 2024-07-28: 150 mg via INTRAVENOUS
  Filled 2024-07-28: qty 150

## 2024-07-28 MED ORDER — PALONOSETRON HCL INJECTION 0.25 MG/5ML
0.2500 mg | Freq: Once | INTRAVENOUS | Status: AC
  Administered 2024-07-28: 0.25 mg via INTRAVENOUS
  Filled 2024-07-28: qty 5

## 2024-07-28 MED ORDER — CETIRIZINE HCL 10 MG/ML IV SOLN
10.0000 mg | Freq: Once | INTRAVENOUS | Status: AC
Start: 2024-07-28 — End: 2024-07-28
  Administered 2024-07-28: 10 mg via INTRAVENOUS
  Filled 2024-07-28: qty 1

## 2024-07-28 MED ORDER — DEXTROSE 5 % IV SOLN: INTRAVENOUS | Status: DC

## 2024-07-28 MED ORDER — ACETAMINOPHEN 325 MG PO TABS
650.0000 mg | ORAL_TABLET | Freq: Once | ORAL | Status: AC
  Administered 2024-07-28: 650 mg via ORAL
  Filled 2024-07-28: qty 2

## 2024-07-28 NOTE — Progress Notes (Signed)
 Patient tolerated chemotherapy with no complaints voiced.  Side effects with management reviewed with understanding verbalized.  Port site clean and dry with no bruising or swelling noted at site.  Good blood return noted before and after administration of chemotherapy.  Band aid applied.  Patient left in satisfactory condition with VSS and no s/s of distress noted. All follow ups as scheduled.   Wendy Zamora [employer]

## 2024-07-28 NOTE — Patient Instructions (Signed)
 [facility] [place] - A DEPT OF [facility]  Discharge Instructions: Thank you for choosing [facility] to provide your oncology and hematology care.  If you have a lab appointment with the Cancer Center - please note that after April 8th, 2024, all labs will be drawn in the cancer center.  You do not have to check in or register with the main entrance as you have in the past but will complete your check-in in the cancer center.  Wear comfortable clothing and clothing appropriate for easy access to any Portacath or PICC line.   We strive to give you quality time with your provider. You may need to reschedule your appointment if you arrive late (15 or more minutes).  Arriving late affects you and other patients whose appointments are after yours.  Also, if you miss three or more appointments without notifying the office, you may be dismissed from the clinic at the provider's discretion.      For prescription refill requests, have your pharmacy contact our office and allow 72 hours for refills to be completed.    Today you received the following chemotherapy and/or immunotherapy agents enhertu      To help prevent nausea and vomiting after your treatment, we encourage you to take your nausea medication as directed.  BELOW ARE SYMPTOMS THAT SHOULD BE REPORTED IMMEDIATELY: *FEVER GREATER THAN 100.4 F (38 C) OR HIGHER *CHILLS OR SWEATING *NAUSEA AND VOMITING THAT IS NOT CONTROLLED WITH YOUR NAUSEA MEDICATION *UNUSUAL SHORTNESS OF BREATH *UNUSUAL BRUISING OR BLEEDING *URINARY PROBLEMS (pain or burning when urinating, or frequent urination) *BOWEL PROBLEMS (unusual diarrhea, constipation, pain near the anus) TENDERNESS IN MOUTH AND THROAT WITH OR WITHOUT PRESENCE OF ULCERS (sore throat, sores in mouth, or a toothache) UNUSUAL RASH, SWELLING OR PAIN  UNUSUAL VAGINAL DISCHARGE OR ITCHING   Items with * indicate a potential emergency and should be followed up  as soon as possible or go to the Emergency Department if any problems should occur.  Please show the CHEMOTHERAPY ALERT CARD or IMMUNOTHERAPY ALERT CARD at check-in to the Emergency Department and triage nurse.  Should you have questions after your visit or need to cancel or reschedule your appointment, please contact [facility] [place] - A DEPT OF Eligha Brideg[facility] [phone]  and follow the prompts.  Office hours are 8:00 a.m. to 4:30 p.m. Monday - Friday. Please note that voicemails left after 4:00 p.m. may not be returned until the following business day.  We are closed weekends and major holidays. You have access to a nurse at all times for urgent questions. Please call the main number to the clinic [phone] and follow the prompts.  For any non-urgent questions, you may also contact your provider using MyChart. We now offer e-Visits for anyone 59 and older to request care online for non-urgent symptoms. For details visit [email or web address].   Also download the MyChart app! Go to the app store, search "MyChart", open the app, select [place], and log in with your MyChart username and password.

## 2024-07-30 ENCOUNTER — Telehealth (INDEPENDENT_AMBULATORY_CARE_PROVIDER_SITE_OTHER): Payer: Self-pay | Admitting: Gastroenterology

## 2024-07-30 ENCOUNTER — Other Ambulatory Visit: Payer: Self-pay | Admitting: Internal Medicine

## 2024-07-30 NOTE — Telephone Encounter (Signed)
 It appears that she has been presenting these episodes of hiccups after radiation.  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  This is a non-gastroenterological condition most likely.  Possibility will be to try a medication called baclofen 5 mg three times a day, which I can initially prescribe for a month.  However, she will need to follow-up with her PCP closely and discuss refilling or following with her oncologist as well. Please ask her if she wants me to prescribe this medication for now.

## 2024-07-30 NOTE — Telephone Encounter (Signed)
 Pt called in and wants to know what she can do to prevent hiccups after radiation. Pt states she had chemo on 07/07/24 and had hiccups for 3-4 days afterwards. Had chemo 07/28/24 and they tried to come back. Pt is wanting to know what she can do to not have hiccups after radiation. Pt states she has been taking chemo for 5 years and never had this happen. Please advise. Thank you!

## 2024-08-02 NOTE — Telephone Encounter (Signed)
 Pt contacted. Pt states she had not had the hiccups at this time. She had been vomiting and having diarrhea since yesterday. Pt states she was constipated for about a week. Pt states she was given something for nausea but that has not helped. Pt wants to hold off on Baclofen at this time.

## 2024-08-03 ENCOUNTER — Other Ambulatory Visit: Payer: Self-pay | Admitting: Gastroenterology

## 2024-08-03 DIAGNOSIS — K581 Irritable bowel syndrome with constipation: Secondary | ICD-10-CM

## 2024-08-03 MED ORDER — LINACLOTIDE 72 MCG PO CAPS
72.0000 ug | ORAL_CAPSULE | Freq: Every day | ORAL | 3 refills | Status: AC
Start: 2024-08-03 — End: ?

## 2024-08-03 NOTE — Telephone Encounter (Signed)
 Noted

## 2024-08-03 NOTE — Telephone Encounter (Signed)
 Noted; do we need to give her samples?

## 2024-08-03 NOTE — Telephone Encounter (Signed)
 We do not, if not covered by her insurance we can try other agent like Amitiza or Ibsrela

## 2024-08-03 NOTE — Telephone Encounter (Signed)
 Spoke with the patient today regarding her symptoms.  States that she had a bout of persistent hiccups after her first chemotherapy regimen but with the second 1 she did not present this, although she felt it was close to happen.  She wanted to know if there was any medication she could try to prevent this.  I advised her that usually we do not prescribe medications to prevent hiccups.  However if she were to have recurrent hiccups, we could do a trial of baclofen as previously advised.  She understood.  We discussed about the potential medications that could have led to this.  Palosetron has been reported to because he comes in less than 1% of patients.  She is endorsing persistent constipation despite trying MiraLAX and Metamucil on a regular basis.  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  Will start her on Linzess  72 mcg every day.

## 2024-08-18 ENCOUNTER — Inpatient Hospital Stay: Attending: Oncology

## 2024-08-18 ENCOUNTER — Inpatient Hospital Stay

## 2024-08-18 ENCOUNTER — Inpatient Hospital Stay: Admitting: Oncology

## 2024-08-18 VITALS — BP 132/83 | HR 69 | Temp 98.2°F | Resp 18

## 2024-08-18 DIAGNOSIS — G6289 Other specified polyneuropathies: Secondary | ICD-10-CM | POA: Diagnosis not present

## 2024-08-18 DIAGNOSIS — Z1732 Human epidermal growth factor receptor 2 negative status: Secondary | ICD-10-CM | POA: Insufficient documentation

## 2024-08-18 DIAGNOSIS — G893 Neoplasm related pain (acute) (chronic): Secondary | ICD-10-CM | POA: Diagnosis not present

## 2024-08-18 DIAGNOSIS — C7951 Secondary malignant neoplasm of bone: Secondary | ICD-10-CM | POA: Diagnosis not present

## 2024-08-18 DIAGNOSIS — Z5112 Encounter for antineoplastic immunotherapy: Secondary | ICD-10-CM | POA: Insufficient documentation

## 2024-08-18 DIAGNOSIS — C50912 Malignant neoplasm of unspecified site of left female breast: Secondary | ICD-10-CM | POA: Insufficient documentation

## 2024-08-18 DIAGNOSIS — Z170 Estrogen receptor positive status [ER+]: Secondary | ICD-10-CM | POA: Insufficient documentation

## 2024-08-18 DIAGNOSIS — D75839 Thrombocytosis, unspecified: Secondary | ICD-10-CM | POA: Diagnosis not present

## 2024-08-18 DIAGNOSIS — C50919 Malignant neoplasm of unspecified site of unspecified female breast: Secondary | ICD-10-CM

## 2024-08-18 DIAGNOSIS — M810 Age-related osteoporosis without current pathological fracture: Secondary | ICD-10-CM

## 2024-08-18 DIAGNOSIS — Z1721 Progesterone receptor positive status: Secondary | ICD-10-CM | POA: Diagnosis not present

## 2024-08-18 LAB — COMPREHENSIVE METABOLIC PANEL WITH GFR
ALT: 36 U/L (ref 0–44)
AST: 31 U/L (ref 15–41)
Albumin: 3.5 g/dL (ref 3.5–5.0)
Alkaline Phosphatase: 69 U/L (ref 38–126)
Anion gap: 11 (ref 5–15)
BUN: 13 mg/dL (ref 8–23)
CO2: 23 mmol/L (ref 22–32)
Calcium: 9.1 mg/dL (ref 8.9–10.3)
Chloride: 107 mmol/L (ref 98–111)
Creatinine, Ser: 0.77 mg/dL (ref 0.44–1.00)
GFR, Estimated: >60 mL/min (ref >60)
Glucose, Bld: 112 mg/dL — ABNORMAL HIGH (ref 70–99)
Potassium: 3.6 mmol/L (ref 3.5–5.1)
Sodium: 141 mmol/L (ref 135–145)
Total Bilirubin: 0.5 mg/dL (ref 0.0–1.2)
Total Protein: 6.8 g/dL (ref 6.5–8.1)

## 2024-08-18 LAB — CBC WITH DIFFERENTIAL/PLATELET
Abs Immature Granulocytes: 0.01 K/uL (ref 0.00–0.07)
Basophils Absolute: 0.1 K/uL (ref 0.0–0.1)
Basophils Relative: 1 %
Eosinophils Absolute: 0.1 K/uL (ref 0.0–0.5)
Eosinophils Relative: 2 %
HCT: 40.8 % (ref 36.0–46.0)
Hemoglobin: 13.8 g/dL (ref 12.0–15.0)
Immature Granulocytes: 0 %
Lymphocytes Relative: 28 %
Lymphs Abs: 1.1 K/uL (ref 0.7–4.0)
MCH: 30.9 pg (ref 26.0–34.0)
MCHC: 33.8 g/dL (ref 30.0–36.0)
MCV: 91.5 fL (ref 80.0–100.0)
Monocytes Absolute: 0.4 K/uL (ref 0.1–1.0)
Monocytes Relative: 11 %
Neutro Abs: 2.3 K/uL (ref 1.7–7.7)
Neutrophils Relative %: 58 %
Platelets: 422 K/uL — ABNORMAL HIGH (ref 150–400)
RBC: 4.46 MIL/uL (ref 3.87–5.11)
RDW: 14.3 % (ref 11.5–15.5)
WBC: 3.9 K/uL — ABNORMAL LOW (ref 4.0–10.5)
nRBC: 0 % (ref 0.0–0.2)

## 2024-08-18 LAB — VITAMIN B12: Vitamin B-12: 654 pg/mL (ref 180–914)

## 2024-08-18 LAB — FOLATE: Folate: 17 ng/mL (ref >5.9)

## 2024-08-18 LAB — IRON AND TIBC
Iron: 76 ug/dL (ref 28–170)
Saturation Ratios: 19 % (ref 10.4–31.8)
TIBC: 393 ug/dL (ref 250–450)
UIBC: 317 ug/dL

## 2024-08-18 LAB — FERRITIN: Ferritin: 37 ng/mL (ref 11–307)

## 2024-08-18 LAB — MAGNESIUM: Magnesium: 1.9 mg/dL (ref 1.7–2.4)

## 2024-08-18 MED ORDER — DEXTROSE 5 % IV SOLN: INTRAVENOUS | Status: DC

## 2024-08-18 MED ORDER — DENOSUMAB 120 MG/1.7ML ~~LOC~~ SOLN
120.0000 mg | Freq: Once | SUBCUTANEOUS | Status: AC
  Administered 2024-08-18: 120 mg via SUBCUTANEOUS
  Filled 2024-08-18: qty 1.7

## 2024-08-18 MED ORDER — CETIRIZINE HCL 10 MG/ML IV SOLN
10.0000 mg | Freq: Once | INTRAVENOUS | Status: AC
  Administered 2024-08-18: 10 mg via INTRAVENOUS
  Filled 2024-08-18: qty 1

## 2024-08-18 MED ORDER — DEXAMETHASONE SODIUM PHOSPHATE 10 MG/ML IJ SOLN
10.0000 mg | Freq: Once | INTRAMUSCULAR | Status: AC
  Administered 2024-08-18: 10 mg via INTRAVENOUS
  Filled 2024-08-18: qty 1

## 2024-08-18 MED ORDER — ACETAMINOPHEN 325 MG PO TABS
650.0000 mg | ORAL_TABLET | Freq: Once | ORAL | Status: AC
  Administered 2024-08-18: 650 mg via ORAL
  Filled 2024-08-18: qty 2

## 2024-08-18 MED ORDER — FAM-TRASTUZUMAB DERUXTECAN-NXKI CHEMO 100 MG IV SOLR
5.4000 mg/kg | Freq: Once | INTRAVENOUS | Status: AC
  Administered 2024-08-18: 400 mg via INTRAVENOUS
  Filled 2024-08-18: qty 20

## 2024-08-18 MED ORDER — PALONOSETRON HCL INJECTION 0.25 MG/5ML
0.2500 mg | Freq: Once | INTRAVENOUS | Status: AC
  Administered 2024-08-18: 0.25 mg via INTRAVENOUS
  Filled 2024-08-18: qty 5

## 2024-08-18 MED ORDER — SODIUM CHLORIDE 0.9 % IV SOLN
150.0000 mg | Freq: Once | INTRAVENOUS | Status: AC
  Administered 2024-08-18: 150 mg via INTRAVENOUS
  Filled 2024-08-18: qty 150

## 2024-08-18 NOTE — Progress Notes (Signed)
 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 female was transferred to my care after her prior physician has left.   ASSESSMENT & PLAN:   Assessment & Plan: Wendy Zamora  is a 63 y.o. 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  - Continue Enhertu  as scheduled.  Patient tolerated previous cycles well and reports no complaints from it. C3D1 today. - Labs reviewed today: CMP: WNL, CBC: Mild leukopenia, normal hemoglobin, mild thrombocytosis with platelets of 422.  CA 15-3: 36-improved from prior -Physical exam stable, can proceed with treatment today. - Will repeat a PET scan 08/2024 as last one was from 05/2024 -Will repeat an echocardiogram in 3 months that is October 2025  Return to clinic in 6 weeks with PET scan to discuss results and further management Metastasis to bone [facility]) Metastatic disease to the bone On denosumab  currently  - Continue denosumab  monthly shots - Continue vitamin D  and calcium supplementation Thrombocytosis Likely secondary to iron deficiency Iron panel obtained today consistent with TSAT of less than 20  - Recommend patient to start taking oral iron every other day.  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  - Continue  Lyrica  25 mg twice daily    The patient understands the plans discussed today and is in agreement with them.  She knows to contact our office if she develops concerns prior to her next appointment.  120 minutes of total time was spent for this patient encounter, including preparation, review of records , face-to-face counseling with the patient and coordination of care, physical exam, and documentation of the encounter.   Mickiel Dry, MD  [facility] [place] - A DEPT OF [facility] [address] [place] [address] Dept: [phone] Dept Fax: [phone]   Orders Placed This Encounter  Procedures   NM PET Image Restag (PS) Skull Base To Thigh    Standing Status:   Future    Expected Date:   09/18/2024    Expiration Date:   08/18/2025    If indicated for the ordered procedure, I authorize the administration of a radiopharmaceutical per Radiology protocol:   Yes    Preferred imaging location?:   [facility]   Iron and TIBC ([facility] ONLY)   Ferritin   Vitamin B12   Folate     ONCOLOGY HISTORY:   I have reviewed her chart and materials related to her cancer extensively and collaborated history with the patient. 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 Baseball size lump palpated in left breast (from documentation) -04/09.2020: Mammogram: Abnormal density in the left breast measuring 94 x 60 x 60  mm. A number of concerning lymph nodes were noted also. Skin was thickened. An ultrasound showed a 14 x 10 x 16 mm irregular mass and a number of concerning lymph nodes with cortical thickening. (from documentation) -04/14/2019: Left breast biopsy 2:00: Invasive carcinoma (ductal type, with lobular  features), grade 2, total score: 6, ER positive, PR positive, HER2 negative [facility]) - 04/28/2019: [facility] germline testing: Negative -05/05/2019-09/16/2019: 4 cycles of Cyclophosphamide and dd epirubicin followed by 12 cycles of weekly paclitaxel  (from documentation) -10/02/2019: Bilateral mastectomies. Pathology:  -Right breast was benign.  Left breast showed residual multicentric invasive ductal cancer, grade 2.  Largest residual tumor mass was at least 8 cm.  Fibrosis was noted, consistent with chemotherapy effect.  Lymphovascular invasion identified.  Negative surgical margins.  3 of 10 lymph nodes showed metastases as well as 1 of 2 sentinel lymph nodes. -Sentinel node #1 had a 6 mm metastasis with extracapsular extension.  Sentinel node 2 was negative.  1 additional node with extracapsular extension.  Another node had fibrosis consistent with chemotherapy effect. -At least 5 foci of cancer were described.  Nuclear pleomorphism score 2, mitotic rate score 2, glandular differentiation score 2.  ER positive, strong intensity staining, 75% of cells and PR positive, strong intensity staining, 95% of cells.  HER-2/neu 2+ by IHC so equivocal.  Also equivocal by FISH.  HER-2 signals 5.5 and CEN 17 signals 3.7 so ratio 1.5.(from documentation, not available under media) -12/09/2019- 11/2020: Anastrazole 1mg  daily. Discontinued on progression -02/03/2020-Current: Monthly Denosumab  -02/29/2020-04/12/2020: RT to regional lymph nodes and chest wall -11/08/2020: CT chest: New sclerotic lesions noted mid thoracic spine. Previously noted left hilar lymph nodes have resolved. No new lymphadenopathy.  -11/24/2020: PET: Bilateral mastectomies with low-grade mildly heterogeneous tracer uptake in the left anterior chest wall reflecting post radiation/treatment changes.  Widespread osseous metastasis in T7, T8, T9, T12, L2, L3, L4, L5, midsternum, right acetabulum, right femoral neck, right  proximal femur, left acetabulum, right anterior fifth rib. The most intense uptake is seen in the right L4 vertebral body with 10.4 SUV. No pathologic fracture is appreciated at this time.  -12/05/2020- 07/23/2023: Palbociclib  + Fulvestrant . Ibrance  was dose reduced to 100 mg 3 weeks on/1 week off on 10/11/2021 due to fatigue.  Discontinued due to progression  -04/13/2021: PET: Widespread hypermetabolic osseous metastatic disease on prior exam shows a marked interval metabolic improvement. On today's exam no metabolic activity is demonstrated above normal osseous background. Some of the sclerotic lesions do show some progression with regard to size and density.  -07/06/2021: CA 15-3:34, CA 27.29:48.7 -08/10/2021: PET: No evidence of hypermetabolic metastatic disease. Widespread scattered sclerotic bone metastases in the spine and pelvis are stable and not metabolically active. No lymphadenopathy.  -03/21/2022: CT CAP: No evidence of lymphadenopathy or soft tissue metastatic disease in the chest, abdomen, or pelvis. Unchanged sclerotic osseous metastatic disease involving the vertebral bodies, bony pelvis, and right femoral neck. Status post left mastectomy. -03/21/2022: NM bone scan: Increased radiotracer uptake in the thoracolumbar spine and RIGHT anterolateral fifth rib consistent with known osseous metastatic disease -09/16/2022: NM bone scan: Right mid rib focus of increased activity again noted. Thoracic and lumbar spine focal areas of increased activity again noted. These findings are consistent with known metastatic disease. Similar findings noted on prior exam. A tiny new focus of increased activity in an upper lumbar vertebral body cannot be excluded. -10/04/2022: CT CAP: Unchanged sclerotic osseous metastatic disease involving the thoracic spine, lumbar spine, pelvis, and  right femoral neck. No evidence of new or progressive metastatic disease in the chest, abdomen or pelvis. -04/02/2023: NM bone  scan: Thoracolumbar and right rib metastases, unchanged. -04/07/2023: CA 15-3:35.4, CA 27.29:39 -04/07/2023: CT CAP: Unchanged sclerotic osseous metastases throughout the ribs, thoracic and lumbar vertebral bodies, pelvis, and right femoral neck. No evidence of new metastatic disease in the chest, abdomen, or pelvis. Status post bilateral mastectomy.  -07/17/2023:PET: Intense metabolic activity associated with sclerotic lesions in the T7 and T9 vertebral bodies consistent with active metastatic disease (breast cancer skeletal metastasis recurrence). No metabolic activity associated with sclerotic lesion in the T8 or L4 vertebral body. Mild activity associated with sclerotic lesion in the posterior RIGHT acetabulum. No soft tissue metastasis.  No lymphadenopathy -08/07/2023-10/20/2023: Ribociclib  + exemestane . Held for rash -10/30/2023: Stable exam. Persistent increased radiotracer uptake within the sclerotic T9 and T7 vertebral metastasis. No new or progressive sites of disease identified. -01/16/2024: Letrozole  2.5mg  daily. Discontinued after 1 dose due to irritation of the throat and tongue.  -02/03/2024-06/16/2024:Elacestrant  258 mg daily. Discontinued due to progression  -06/03/2024: CA 15-3:45.7, CA 27.29:67.3 -06/10/2024: EZU:Emnhmzddpnw of skeletal metastasis with multiple new hypermetabolic lesions in the pelvis, spine and there are approximately 20 skeletal lesions now. Previously 2 distinct thoracic spine lesions.Stable lesions in the thoracic spine. No evidence of visceral metastasis. -06/30/2024: ESR1 D538G. TMB: 13.29mut/Mb, MSI-H: not detected, HRD: Negative. -06/30/2024: ECHO: LVEF:55-60% -07/07/2024-Current: Enhertu  5.4mg /kg every 21 days -08/18/2024: CA 15-3:36, CA 27.29:NA    Current Treatment:  Enhertu  5.4mg /kg every 21 days   INTERVAL HISTORY:   CHARLSIE FLEEGER is here today for follow up.   Patient is tolerating Enhertu  well.  She reports mild fatigue and baseline  peripheral neuropathy.  She denies any new complaints.  Overall, is doing well.   I have reviewed the past medical history, past surgical history, social history and family history with the patient and they are unchanged from previous note.  ALLERGIES:  is allergic to augmentin [amoxicillin-pot clavulanate], ciprofloxacin, claritin [loratadine], gabapentin, keflex [cephalexin], levofloxacin, lisinopril, lisinopril, metoprolol, metoprolol, nexium [esomeprazole], sulfa antibiotics, tetracyclines & related, iohexol , ultram  [tramadol ], anastrozole , ceftin [cefuroxime axetil], chromium, exemestane , iodinated contrast media, kisqali  (200 mg dose) [ribociclib  succ (200 mg dose)], misc. sulfonamide containing compounds, oxycodone, sulfa antibiotics, latex, and nickel.  MEDICATIONS:  Current Outpatient Medications  Medication Sig Dispense Refill   amLODipine  (NORVASC ) 5 MG tablet Take 1 tablet by mouth once daily 90 tablet 0   Ascorbic Acid (VITAMIN C) 1000 MG tablet Take 1,000 mg by mouth daily.     aspirin  81 MG EC tablet Take 81 mg by mouth daily.     betamethasone  dipropionate 0.05 % cream Apply topically 2 (two) times daily. 30 g 1   Calcium-Magnesium-Vitamin D  (CALCIUM 1200+D3 PO) Take 1 tablet by mouth daily.     cetirizine -pseudoephedrine (ZYRTEC -D) 5-120 MG tablet Take 1 tablet by mouth daily.     CINNAMON PO Take 1,000 mg by mouth daily.     cyanocobalamin  1000 MCG tablet Take 1 tablet (1,000 mcg total) by mouth daily. (Patient taking differently: Take 1,000 mcg by mouth every other day.) 30 tablet 6   cyclobenzaprine  (FLEXERIL ) 10 MG tablet Take 1 tablet (10 mg total) by mouth 2 (two) times daily as needed for muscle spasms. 60 tablet 5   denosumab  (XGEVA ) 120 MG/1.7ML SOLN injection Inject 120 mg into the skin every 30 (thirty) days.     furosemide  (LASIX ) 20 MG tablet Take 1 tablet (20 mg total) by mouth daily as  needed for edema (Take daily as needed for edema). 30 tablet 2    HYDROcodone -acetaminophen  (NORCO/VICODIN) 5-325 MG tablet Take 1 tablet by mouth every 8 (eight) hours as needed for moderate pain (pain score 4-6). 90 tablet 0   lansoprazole  (PREVACID ) 30 MG capsule Take 1 capsule (30 mg total) by mouth 2 (two) times daily. TAKE 1 CAPSULE BY MOUTH IN THE MORNING AND AT BEDTIME 180 capsule 3   lidocaine -prilocaine  (EMLA ) cream Apply 1 Application topically as needed (Apply to port site prior to port access). 30 g 6   linaclotide  (LINZESS ) 72 MCG capsule Take 1 capsule (72 mcg total) by mouth daily before breakfast. 90 capsule 3   Misc. Devices MISC Please provide patient with left arm compression sleeve. Diagnosis metastatic breast cancer, bilateral mastectomy, left arm lymphedema 1 each 6   Misc. Devices MISC Please provide with prosthetic mastectomy bra 1 each 6   NAPHCON-A 0.025-0.3 % ophthalmic solution Place 2 drops into both eyes in the morning and at bedtime.     pregabalin  (LYRICA ) 25 MG capsule Take 1 capsule by mouth twice daily (Patient taking differently: Take 25 mg by mouth 2 (two) times daily. As needed.) 60 capsule 3   prochlorperazine  (COMPAZINE ) 10 MG tablet Take 1 tablet (10 mg total) by mouth every 6 (six) hours as needed for nausea or vomiting. 60 tablet 2   Triamcinolone  Acetonide (TRIAMCINOLONE  0.1 % CREAM : EUCERIN) CREA Apply 1 Application topically 3 (three) times daily as needed. 1 each 0   triamcinolone  cream (KENALOG ) 0.1 % Apply 1 Application topically 2 (two) times daily. 30 g 0   No current facility-administered medications for this visit.    REVIEW OF SYSTEMS:   Constitutional: Denies fevers, chills or abnormal weight loss Eyes: Denies blurriness of vision Ears, nose, mouth, throat, and face: Denies mucositis or sore throat Respiratory: Denies cough, dyspnea or wheezes Cardiovascular: Denies palpitation, chest discomfort or lower extremity swelling Gastrointestinal:  Denies nausea, heartburn or change in bowel habits Skin:  Denies abnormal skin rashes Lymphatics: Denies new lymphadenopathy or easy bruising Neurological:Denies numbness, tingling or new weaknesses Behavioral/Psych: Mood is stable, no new changes  All other systems were reviewed with the patient and are negative.   VITALS:  Last menstrual period 06/08/2012.  Wt Readings from Last 3 Encounters:  08/18/24 177 lb 9.6 oz (80.6 kg)  07/28/24 179 lb 3.7 oz (81.3 kg)  07/13/24 179 lb 6.4 oz (81.4 kg)    There is no height or weight on file to calculate BMI.  Performance status (ECOG): 1 - Symptomatic but completely ambulatory  PHYSICAL EXAM:   GENERAL:alert, no distress and comfortable SKIN: skin color, texture, turgor are normal, no rashes or significant lesions BREAST: Bilateral mastectomy. No masses palpated. No lymphadenopathy palpated LYMPH:  no palpable lymphadenopathy in the cervical, axillary or inguinal LUNGS: clear to auscultation and percussion with normal breathing effort HEART: regular rate & rhythm and no murmurs and no lower extremity edema ABDOMEN:abdomen soft, non-tender and normal bowel sounds Musculoskeletal:no cyanosis of digits and no clubbing  NEURO: alert & oriented x 3 with fluent speech, no focal motor/sensory deficits  LABORATORY DATA:  I have reviewed the data as listed    Component Value Date/Time   NA 141 08/18/2024 0908   K 3.6 08/18/2024 0908   CL 107 08/18/2024 0908   CO2 23 08/18/2024 0908   GLUCOSE 112 (H) 08/18/2024 0908   BUN 13 08/18/2024 0908   CREATININE 0.77 08/18/2024 0908   CALCIUM  9.1 08/18/2024 0908   PROT 6.8 08/18/2024 0908   ALBUMIN 3.5 08/18/2024 0908   AST 31 08/18/2024 0908   ALT 36 08/18/2024 0908   ALKPHOS 69 08/18/2024 0908   BILITOT 0.5 08/18/2024 0908   GFRNONAA >60 08/18/2024 0908   GFRAA >90 06/08/2012 1251    Lab Results  Component Value Date   WBC 3.9 (L) 08/18/2024   NEUTROABS 2.3 08/18/2024   HGB 13.8 08/18/2024   HCT 40.8 08/18/2024   MCV 91.5 08/18/2024   PLT  422 (H) 08/18/2024      Chemistry      Component Value Date/Time   NA 141 08/18/2024 0908   K 3.6 08/18/2024 0908   CL 107 08/18/2024 0908   CO2 23 08/18/2024 0908   BUN 13 08/18/2024 0908   CREATININE 0.77 08/18/2024 0908      Component Value Date/Time   CALCIUM 9.1 08/18/2024 0908   ALKPHOS 69 08/18/2024 0908   AST 31 08/18/2024 0908   ALT 36 08/18/2024 0908   BILITOT 0.5 08/18/2024 0908      Latest Reference Range & Units 08/18/24 09:08  Iron 28 - 170 ug/dL 76  UIBC ug/dL 682  TIBC 749 - 549 ug/dL 606  Saturation Ratios 10.4 - 31.8 % 19  Ferritin 11 - 307 ng/mL 37  Folate >5.9 ng/mL 17.0  Vitamin B12 180 - 914 pg/mL 654    Latest Reference Range & Units 08/18/24 09:08  CA 15-3 0.0 - 25.0 U/mL 36.0 (H)  (H): Data is abnormally high  RADIOGRAPHIC STUDIES: I have personally reviewed the radiological report as listed below  ECHOCARDIOGRAM COMPLETE    ECHOCARDIOGRAM REPORT       Patient Name:   DENISSE WHITENACK Date of Exam: 06/30/2024 Medical Rec #:  [id number]       Height:       64.0 in Accession #:    [id number]      Weight:       180.1 lb Date of Birth:  [birth year]       BSA:          1.871 m Patient Age:    63 years        BP:           124/80 mmHg Patient Gender: F               HR:           79 bpm. Exam Location:  Inpatient  Procedure: 2D Echo, Color Doppler and Cardiac Doppler (Both Spectral and Color            Flow Doppler were utilized during procedure).  Indications:    Chemo Z09   History:        Patient has no prior history of Echocardiogram examinations.   Sonographer:    Benard Stallion Referring Phys: [phone] SREEDHAR KATRAGADDA  IMPRESSIONS   1. Left ventricular ejection fraction, by estimation, is 55 to 60%. The left ventricle has normal function. The left ventricle has no regional wall motion abnormalities. Left ventricular diastolic parameters were normal.  2. Right ventricular systolic function is normal. The right ventricular size is  normal.  3. The mitral valve is normal in structure. No evidence of mitral valve regurgitation. No evidence of mitral stenosis.  4. The aortic valve is normal in structure. Aortic valve regurgitation is not visualized. No aortic stenosis is present.  5. The inferior vena cava is normal in size with greater than 50% respiratory  variability, suggesting right atrial pressure of 3 mmHg.  FINDINGS  Left Ventricle: Left ventricular ejection fraction, by estimation, is 55 to 60%. The left ventricle has normal function. The left ventricle has no regional wall motion abnormalities. The left ventricular internal cavity size was normal in size. There is  no left ventricular hypertrophy. Left ventricular diastolic parameters were normal.  Right Ventricle: The right ventricular size is normal. No increase in right ventricular wall thickness. Right ventricular systolic function is normal.  Left Atrium: Left atrial size was normal in size.  Right Atrium: Right atrial size was normal in size.  Pericardium: There is no evidence of pericardial effusion.  Mitral Valve: The mitral valve is normal in structure. No evidence of mitral valve regurgitation. No evidence of mitral valve stenosis.  Tricuspid Valve: The tricuspid valve is normal in structure. Tricuspid valve regurgitation is not demonstrated. No evidence of tricuspid stenosis.  Aortic Valve: The aortic valve is normal in structure. Aortic valve regurgitation is not visualized. No aortic stenosis is present. Aortic valve mean gradient measures 4.0 mmHg. Aortic valve peak gradient measures 7.6 mmHg. Aortic valve area, by VTI  measures 2.59 cm.  Pulmonic Valve: The pulmonic valve was normal in structure. Pulmonic valve regurgitation is not visualized. No evidence of pulmonic stenosis.  Aorta: The aortic root is normal in size and structure.  Venous: The inferior vena cava is normal in size with greater than 50% respiratory variability, suggesting right  atrial pressure of 3 mmHg.  IAS/Shunts: No atrial level shunt detected by color flow Doppler.    LEFT VENTRICLE PLAX 2D LVIDd:         3.70 cm     Diastology LVIDs:         2.40 cm     LV e' medial:    12.00 cm/s LV PW:         1.10 cm     LV E/e' medial:  8.2 LV IVS:        1.10 cm     LV e' lateral:   10.90 cm/s LVOT diam:     2.00 cm     LV E/e' lateral: 9.1 LV SV:         72 LV SV Index:   39 LVOT Area:     3.14 cm   LV Volumes (MOD) LV vol d, MOD A2C: 51.9 ml LV vol d, MOD A4C: 47.8 ml LV vol s, MOD A2C: 17.2 ml LV vol s, MOD A4C: 15.7 ml LV SV MOD A2C:     34.7 ml LV SV MOD A4C:     47.8 ml LV SV MOD BP:      34.1 ml  RIGHT VENTRICLE RV Basal diam:  3.10 cm RV Mid diam:    2.70 cm RV S prime:     10.20 cm/s TAPSE (M-mode): 1.8 cm  LEFT ATRIUM             Index        RIGHT ATRIUM          Index LA diam:        2.90 cm 1.55 cm/m   RA Area:     8.61 cm LA Vol (A2C):   24.8 ml 13.25 ml/m  RA Volume:   14.80 ml 7.91 ml/m LA Vol (A4C):   23.7 ml 12.67 ml/m LA Biplane Vol: 24.7 ml 13.20 ml/m  AORTIC VALVE AV Area (Vmax):    2.73 cm AV Area (Vmean):   2.61 cm  AV Area (VTI):     2.59 cm AV Vmax:           138.00 cm/s AV Vmean:          93.700 cm/s AV VTI:            0.279 m AV Peak Grad:      7.6 mmHg AV Mean Grad:      4.0 mmHg LVOT Vmax:         120.00 cm/s LVOT Vmean:        77.800 cm/s LVOT VTI:          0.230 m LVOT/AV VTI ratio: 0.82   AORTA Ao Root diam: 2.80 cm Ao Asc diam:  3.50 cm  MITRAL VALVE MV Area (PHT): 4.49 cm    SHUNTS MV Decel Time: 169 msec    Systemic VTI:  0.23 m MV E velocity: 98.80 cm/s  Systemic Diam: 2.00 cm MV A velocity: 75.80 cm/s MV E/A ratio:  1.30  Aditya Sabharwal Electronically signed by Ria Commander Signature Date/Time: 06/30/2024/4:47:53 PM      Final

## 2024-08-18 NOTE — Patient Instructions (Signed)
 [facility] - A DEPT OF [place]  Discharge Instructions: Thank you for choosing [facility] to provide your oncology and hematology care.  If you have a lab appointment with the Cancer Center - please note that after April 8th, 2024, all labs will be drawn in the cancer center.  You do not have to check in or register with the main entrance as you have in the past but will complete your check-in in the cancer center.  Wear comfortable clothing and clothing appropriate for easy access to any Portacath or PICC line.   We strive to give you quality time with your provider. You may need to reschedule your appointment if you arrive late (15 or more minutes).  Arriving late affects you and other patients whose appointments are after yours.  Also, if you miss three or more appointments without notifying the office, you may be dismissed from the clinic at the provider's discretion.      For prescription refill requests, have your pharmacy contact our office and allow 72 hours for refills to be completed.    Today you received the following chemotherapy and/or immunotherapy agents Enhertu  and Xgeva .  Fam-Trastuzumab  Deruxtecan Injection What is this medication? FAM-TRASTUZUMAB  DERUXTECAN (fam-tras TOOZ eu mab DER ux TEE kan) treats some types of cancer. It works by blocking a protein that causes cancer cells to grow and multiply. This helps to slow or stop the spread of cancer cells. This medicine may be used for other purposes; ask your health care provider or pharmacist if you have questions. COMMON BRAND NAME(S): ENHERTU  What should I tell my care team before I take this medication? They need to know if you have any of these conditions: Heart disease Heart failure Infection, especially a viral infection, such as chickenpox, cold sores, or herpes Liver disease Lung or breathing disease, such as asthma or COPD An unusual or allergic reaction to  fam-trastuzumab  deruxtecan, other medications, foods, dyes, or preservatives Pregnant or trying to get pregnant Breast-feeding How should I use this medication? This medication is injected into a vein. It is given by your care team in a hospital or clinic setting. A special MedGuide will be given to you before each treatment. Be sure to read this information carefully each time. Talk to your care team about the use of this medication in children. Special care may be needed. Overdosage: If you think you have taken too much of this medicine contact a poison control center or emergency room at once. NOTE: This medicine is only for you. Do not share this medicine with others. What if I miss a dose? It is important not to miss your dose. Call your care team if you are unable to keep an appointment. What may interact with this medication? Interactions are not expected. This list may not describe all possible interactions. Give your health care provider a list of all the medicines, herbs, non-prescription drugs, or dietary supplements you use. Also tell them if you smoke, drink alcohol, or use illegal drugs. Some items may interact with your medicine. What should I watch for while using this medication? Visit your care team for regular checks on your progress. Tell your care team if your symptoms do not start to get better or if they get worse. This medication may increase your risk of getting an infection. Call your care team for advice if you get a fever, chills, sore throat, or other symptoms of a cold  or flu. Do not treat yourself. Try to avoid being around people who are sick. Avoid taking medications that contain aspirin , acetaminophen , ibuprofen, naproxen, or ketoprofen unless instructed by your care team. These medications may hide a fever. Be careful brushing or flossing your teeth or using a toothpick because you may get an infection or bleed more easily. If you have any dental work done, tell  your dentist you are receiving this medication. This medication may cause dry eyes and blurred vision. If you wear contact lenses, you may feel some discomfort. Lubricating eye drops may help. See your care team if the problem does not go away or is severe. Talk to your care team if you may be pregnant. Serious birth defects can occur if you take this medication during pregnancy and for 7 months after the last dose. If your partner can get pregnant, use a condom during sex while taking this medication and for 4 months after the last dose. Do not breastfeed while taking this medication and for 7 months after the last dose. This medication may cause infertility. Talk to your care team if you are concerned about your fertility. What side effects may I notice from receiving this medication? Side effects that you should report to your care team as soon as possible: Allergic reactions--skin rash, itching, hives, swelling of the face, lips, tongue, or throat Dry cough, shortness of breath or trouble breathing Infection--fever, chills, cough, sore throat, wounds that don't heal, pain or trouble when passing urine, general feeling of discomfort or being unwell Heart failure--shortness of breath, swelling of the ankles, feet, or hands, sudden weight gain, unusual weakness or fatigue Unusual bruising or bleeding Side effects that usually do not require medical attention (report these to your care team if they continue or are bothersome): Constipation Diarrhea Hair loss Muscle pain Nausea Vomiting This list may not describe all possible side effects. Call your doctor for medical advice about side effects. You may report side effects to FDA at 1-800-FDA-1088. Where should I keep my medication? This medication is given in a hospital or clinic. It will not be stored at home. NOTE: This sheet is a summary. It may not cover all possible information. If you have questions about this medicine, talk to your  doctor, pharmacist, or health care provider.  2024 Elsevier/Gold Standard (2023-08-15 00:00:00)  Denosumab  Injection (Oncology) What is this medication? DENOSUMAB  (den oh SUE mab) prevents weakened bones caused by cancer. It may also be used to treat noncancerous bone tumors that cannot be removed by surgery. It can also be used to treat high calcium levels in the blood caused by cancer. It works by blocking a protein that causes bones to break down quickly. This slows down the release of calcium from bones, which lowers calcium levels in your blood. It also makes your bones stronger and less likely to break (fracture). This medicine may be used for other purposes; ask your health care provider or pharmacist if you have questions. COMMON BRAND NAME(S): XGEVA  What should I tell my care team before I take this medication? They need to know if you have any of these conditions: Dental disease Having surgery or tooth extraction Infection Kidney disease Low levels of calcium or vitamin D  in the blood Malnutrition On hemodialysis Skin conditions or sensitivity Thyroid  or parathyroid disease An unusual reaction to denosumab , other medications, foods, dyes, or preservatives Pregnant or trying to get pregnant Breast-feeding How should I use this medication? This medication is for injection under  the skin. It is given by your care team in a hospital or clinic setting. A special MedGuide will be given to you before each treatment. Be sure to read this information carefully each time. Talk to your care team about the use of this medication in children. While it may be prescribed for children as young as 13 years for selected conditions, precautions do apply. Overdosage: If you think you have taken too much of this medicine contact a poison control center or emergency room at once. NOTE: This medicine is only for you. Do not share this medicine with others. What if I miss a dose? Keep appointments  for follow-up doses. It is important not to miss your dose. Call your care team if you are unable to keep an appointment. What may interact with this medication? Do not take this medication with any of the following: Other medications containing denosumab  This medication may also interact with the following: Medications that lower your chance of fighting infection Steroid medications, such as prednisone  or cortisone This list may not describe all possible interactions. Give your health care provider a list of all the medicines, herbs, non-prescription drugs, or dietary supplements you use. Also tell them if you smoke, drink alcohol, or use illegal drugs. Some items may interact with your medicine. What should I watch for while using this medication? Your condition will be monitored carefully while you are receiving this medication. You may need blood work while taking this medication. This medication may increase your risk of getting an infection. Call your care team for advice if you get a fever, chills, sore throat, or other symptoms of a cold or flu. Do not treat yourself. Try to avoid being around people who are sick. You should make sure you get enough calcium and vitamin D  while you are taking this medication, unless your care team tells you not to. Discuss the foods you eat and the vitamins you take with your care team. Some people who take this medication have severe bone, joint, or muscle pain. This medication may also increase your risk for jaw problems or a broken thigh bone. Tell your care team right away if you have severe pain in your jaw, bones, joints, or muscles. Tell your care team if you have any pain that does not go away or that gets worse. Talk to your care team if you may be pregnant. Serious birth defects can occur if you take this medication during pregnancy and for 5 months after the last dose. You will need a negative pregnancy test before starting this medication.  Contraception is recommended while taking this medication and for 5 months after the last dose. Your care team can help you find the option that works for you. What side effects may I notice from receiving this medication? Side effects that you should report to your care team as soon as possible: Allergic reactions--skin rash, itching, hives, swelling of the face, lips, tongue, or throat Bone, joint, or muscle pain Low calcium level--muscle pain or cramps, confusion, tingling, or numbness in the hands or feet Osteonecrosis of the jaw--pain, swelling, or redness in the mouth, numbness of the jaw, poor healing after dental work, unusual discharge from the mouth, visible bones in the mouth Side effects that usually do not require medical attention (report to your care team if they continue or are bothersome): Cough Diarrhea Fatigue Headache Nausea This list may not describe all possible side effects. Call your doctor for medical advice about side effects.  You may report side effects to FDA at 1-800-FDA-1088. Where should I keep my medication? This medication is given in a hospital or clinic. It will not be stored at home. NOTE: This sheet is a summary. It may not cover all possible information. If you have questions about this medicine, talk to your doctor, pharmacist, or health care provider.  2024 Elsevier/Gold Standard (2022-05-08 00:00:00)   To help prevent nausea and vomiting after your treatment, we encourage you to take your nausea medication as directed.  BELOW ARE SYMPTOMS THAT SHOULD BE REPORTED IMMEDIATELY: *FEVER GREATER THAN 100.4 F (38 C) OR HIGHER *CHILLS OR SWEATING *NAUSEA AND VOMITING THAT IS NOT CONTROLLED WITH YOUR NAUSEA MEDICATION *UNUSUAL SHORTNESS OF BREATH *UNUSUAL BRUISING OR BLEEDING *URINARY PROBLEMS (pain or burning when urinating, or frequent urination) *BOWEL PROBLEMS (unusual diarrhea, constipation, pain near the anus) TENDERNESS IN MOUTH AND THROAT WITH OR  WITHOUT PRESENCE OF ULCERS (sore throat, sores in mouth, or a toothache) UNUSUAL RASH, SWELLING OR PAIN  UNUSUAL VAGINAL DISCHARGE OR ITCHING   Items with * indicate a potential emergency and should be followed up as soon as possible or go to the Emergency Department if any problems should occur.  Please show the CHEMOTHERAPY ALERT CARD or IMMUNOTHERAPY ALERT CARD at check-in to the Emergency Department and triage nurse.  Should you have questions after your visit or need to cancel or reschedule your appointment, please contact [facility] [place] - A DEPT OF [facility] [phone]  and follow the prompts.  Office hours are 8:00 a.m. to 4:30 p.m. Monday - Friday. Please note that voicemails left after 4:00 p.m. may not be returned until the following business day.  We are closed weekends and major holidays. You have access to a nurse at all times for urgent questions. Please call the main number to the clinic [phone] and follow the prompts.  For any non-urgent questions, you may also contact your provider using MyChart. We now offer e-Visits for anyone 15 and older to request care online for non-urgent symptoms. For details visit [email or web address].   Also download the MyChart app! Go to the app store, search MyChart, open the app, select , and log in with your MyChart username and password.

## 2024-08-18 NOTE — Progress Notes (Signed)
 Patient presents today for chemotherapy infusion of Enhertu  and Xgeva  injection. Patient is in satisfactory condition with no new complaints voiced.  Vital signs are stable.  Labs reviewed by Dr. Davonna during the office visit and all labs are within treatment parameters.  We will proceed with treatment per MD orders.   Patient tolerated treatment well with no complaints voiced. Patient tolerated injection in right arm with no complaints voiced.  Site clean and dry with no bruising or swelling noted.  No complaints of pain.  Discharged with vital signs stable and no signs or symptoms of distress noted.

## 2024-08-18 NOTE — Patient Instructions (Signed)

## 2024-08-19 ENCOUNTER — Other Ambulatory Visit: Payer: Self-pay | Admitting: *Deleted

## 2024-08-19 ENCOUNTER — Ambulatory Visit

## 2024-08-19 DIAGNOSIS — R42 Dizziness and giddiness: Secondary | ICD-10-CM

## 2024-08-19 DIAGNOSIS — I10 Essential (primary) hypertension: Secondary | ICD-10-CM

## 2024-08-19 LAB — CANCER ANTIGEN 15-3: CA 15-3: 36 U/mL — ABNORMAL HIGH (ref 0.0–25.0)

## 2024-08-29 ENCOUNTER — Encounter: Payer: Self-pay | Admitting: Oncology

## 2024-08-29 DIAGNOSIS — G893 Neoplasm related pain (acute) (chronic): Secondary | ICD-10-CM | POA: Insufficient documentation

## 2024-08-29 DIAGNOSIS — G629 Polyneuropathy, unspecified: Secondary | ICD-10-CM | POA: Insufficient documentation

## 2024-08-29 DIAGNOSIS — C7951 Secondary malignant neoplasm of bone: Secondary | ICD-10-CM | POA: Insufficient documentation

## 2024-08-29 NOTE — Assessment & Plan Note (Signed)
 Patient has cancer related pain  - Continue hydrocodone  as prescribed as needed

## 2024-08-29 NOTE — Assessment & Plan Note (Signed)
 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  - Continue Lyrica  25 mg twice daily

## 2024-08-29 NOTE — Assessment & Plan Note (Addendum)
 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  - Continue Enhertu  as scheduled.  Patient tolerated previous cycles well and reports no complaints from it. C3D1 today. - Labs reviewed today: CMP: WNL, CBC: Mild leukopenia, normal hemoglobin, mild thrombocytosis with platelets of 422.  CA 15-3: 36-improved from prior -Physical exam stable, can proceed with treatment today. - Will repeat a PET scan 08/2024 as last one was from 05/2024 -Will repeat an echocardiogram in 3 months that is October 2025  Return to clinic in 6 weeks with PET scan to discuss results and further management

## 2024-08-29 NOTE — Assessment & Plan Note (Signed)
 Metastatic disease to the bone On denosumab  currently  - Continue denosumab  monthly shots - Continue vitamin D  and calcium supplementation

## 2024-08-29 NOTE — Progress Notes (Incomplete)
 Patient Care Team: Bevely Doffing, FNP as PCP - General (Family Medicine) Celestia Joesph SQUIBB, RN as Oncology Nurse Navigator (Oncology)  Clinic Day:  08/18/2024  Referring physician: Bevely Doffing, FNP   CHIEF COMPLAINT:  CC: ***  Wendy Zamora 63 y.o. female was transferred to my care after her prior physician has left.   Wendy & PLAN:   Wendy Zamora  is a 63 y.o. female with ***  Wendy & Plan     The patient understands the plans discussed today and is in agreement with them.  She knows to contact our office if she develops concerns prior to her next appointment.  *** minutes of total time was spent for this patient encounter, including preparation, face-to-face counseling with the patient and coordination of care, physical exam, and documentation of the encounter. > 50% of the time was spent on counseling as documented under my Wendy and plan.    Mickiel Dry, MD  [facility] [place] - A DEPT OF [facility] [address] [place] [address] Dept: [phone] Dept Fax: [phone]   No orders of the defined types were placed in this encounter.    ONCOLOGY HISTORY:   I have reviewed her chart and materials related to her cancer extensively and collaborated history with the patient. Summary of oncologic history is as follows:    Metastatic breast cancer to the bones, ER/PR positive and HER2 negative:  -04/14/2019: Left breast biopsy 2:00: Invasive carcinoma(ductal type, with lobular features), grade 2, total score: 6, ER positive, PR positive, HER2 negative [facility]) - 04/28/2019: [facility] germline testing: Negative -06/10/2024: EZU:Emnhmzddpnw of skeletal metastasis with multiple new hypermetabolic lesions in the pelvis, spine and there are approximately 20 skeletal lesions now. Previously 2 distinct thoracic spine lesions.Stable lesions in  the thoracic spine. No evidence of visceral metastasis.     Current Treatment:  ***  INTERVAL HISTORY:   Wendy Zamora is here today for follow up. Patient is accompanied by *** .     I have reviewed the past medical history, past surgical history, social history and family history with the patient and they are unchanged from previous note.  ALLERGIES:  is allergic to augmentin [amoxicillin-pot clavulanate], ciprofloxacin, claritin [loratadine], gabapentin, keflex [cephalexin], levofloxacin, lisinopril, lisinopril, metoprolol, metoprolol, nexium [esomeprazole], sulfa antibiotics, tetracyclines & related, iohexol , ultram  [tramadol ], anastrozole , ceftin [cefuroxime axetil], chromium, exemestane , iodinated contrast media, kisqali  (200 mg dose) [ribociclib  succ (200 mg dose)], misc. sulfonamide containing compounds, oxycodone, sulfa antibiotics, latex, and nickel.  MEDICATIONS:  Current Outpatient Medications  Medication Sig Dispense Refill  . amLODipine  (NORVASC ) 5 MG tablet Take 1 tablet by mouth once daily 90 tablet 0  . Ascorbic Acid (VITAMIN C) 1000 MG tablet Take 1,000 mg by mouth daily.    . aspirin  81 MG EC tablet Take 81 mg by mouth daily.    . betamethasone  dipropionate 0.05 % cream Apply topically 2 (two) times daily. 30 g 1  . Calcium-Magnesium-Vitamin D  (CALCIUM 1200+D3 PO) Take 1 tablet by mouth daily.    . cetirizine -pseudoephedrine (ZYRTEC -D) 5-120 MG tablet Take 1 tablet by mouth daily.    SABRA CINNAMON PO Take 1,000 mg by mouth daily.    . cyanocobalamin  1000 MCG tablet Take 1 tablet (1,000 mcg total) by mouth daily. (Patient taking differently: Take 1,000 mcg by mouth every other day.) 30 tablet 6  . cyclobenzaprine  (FLEXERIL ) 10 MG tablet Take 1  tablet (10 mg total) by mouth 2 (two) times daily as needed for muscle spasms. 60 tablet 5  . denosumab  (XGEVA ) 120 MG/1.7ML SOLN injection Inject 120 mg into the skin every 30 (thirty) days.    . furosemide  (LASIX ) 20 MG tablet  Take 1 tablet (20 mg total) by mouth daily as needed for edema (Take daily as needed for edema). 30 tablet 2  . HYDROcodone -acetaminophen  (NORCO/VICODIN) 5-325 MG tablet Take 1 tablet by mouth every 8 (eight) hours as needed for moderate pain (pain score 4-6). 90 tablet 0  . lansoprazole  (PREVACID ) 30 MG capsule Take 1 capsule (30 mg total) by mouth 2 (two) times daily. TAKE 1 CAPSULE BY MOUTH IN THE MORNING AND AT BEDTIME 180 capsule 3  . lidocaine -prilocaine  (EMLA ) cream Apply 1 Application topically as needed (Apply to port site prior to port access). 30 g 6  . linaclotide  (LINZESS ) 72 MCG capsule Take 1 capsule (72 mcg total) by mouth daily before breakfast. 90 capsule 3  . Misc. Devices MISC Please provide patient with left arm compression sleeve. Diagnosis metastatic breast cancer, bilateral mastectomy, left arm lymphedema 1 each 6  . Misc. Devices MISC Please provide with prosthetic mastectomy bra 1 each 6  . NAPHCON-A 0.025-0.3 % ophthalmic solution Place 2 drops into both eyes in the morning and at bedtime.    . pregabalin  (LYRICA ) 25 MG capsule Take 1 capsule by mouth twice daily (Patient taking differently: Take 25 mg by mouth 2 (two) times daily. As needed.) 60 capsule 3  . prochlorperazine  (COMPAZINE ) 10 MG tablet Take 1 tablet (10 mg total) by mouth every 6 (six) hours as needed for nausea or vomiting. 60 tablet 2  . Triamcinolone  Acetonide (TRIAMCINOLONE  0.1 % CREAM : EUCERIN) CREA Apply 1 Application topically 3 (three) times daily as needed. 1 each 0  . triamcinolone  cream (KENALOG ) 0.1 % Apply 1 Application topically 2 (two) times daily. 30 g 0   No current facility-administered medications for this visit.    REVIEW OF SYSTEMS:   Constitutional: Denies fevers, chills or abnormal weight loss Eyes: Denies blurriness of vision Ears, nose, mouth, throat, and face: Denies mucositis or sore throat Respiratory: Denies cough, dyspnea or wheezes Cardiovascular: Denies palpitation,  chest discomfort or lower extremity swelling Gastrointestinal:  Denies nausea, heartburn or change in bowel habits Skin: Denies abnormal skin rashes Lymphatics: Denies new lymphadenopathy or easy bruising Neurological:Denies numbness, tingling or new weaknesses Behavioral/Psych: Mood is stable, no new changes  All other systems were reviewed with the patient and are negative.   VITALS:  Last menstrual period 06/08/2012.  Wt Readings from Last 3 Encounters:  07/28/24 179 lb 3.7 oz (81.3 kg)  07/13/24 179 lb 6.4 oz (81.4 kg)  07/07/24 175 lb (79.4 kg)    There is no height or weight on file to calculate BMI.  Performance status (ECOG): {CHL ONC [id number]  PHYSICAL EXAM:   GENERAL:alert, no distress and comfortable SKIN: skin color, texture, turgor are normal, no rashes or significant lesions EYES: normal, Conjunctiva are pink and non-injected, sclera clear OROPHARYNX:no exudate, no erythema and lips, buccal mucosa, and tongue normal  NECK: supple, thyroid  normal size, non-tender, without nodularity LYMPH:  no palpable lymphadenopathy in the cervical, axillary or inguinal LUNGS: clear to auscultation and percussion with normal breathing effort HEART: regular rate & rhythm and no murmurs and no lower extremity edema ABDOMEN:abdomen soft, non-tender and normal bowel sounds Musculoskeletal:no cyanosis of digits and no clubbing  NEURO: alert & oriented x 3  with fluent speech, no focal motor/sensory deficits  LABORATORY DATA:  I have reviewed the data as listed    Component Value Date/Time   NA 140 07/28/2024 1229   K 3.8 07/28/2024 1229   CL 107 07/28/2024 1229   CO2 22 07/28/2024 1229   GLUCOSE 111 (H) 07/28/2024 1229   BUN 16 07/28/2024 1229   CREATININE 0.75 07/28/2024 1229   CALCIUM 9.4 07/28/2024 1229   PROT 6.9 07/28/2024 1229   ALBUMIN 3.7 07/28/2024 1229   AST 28 07/28/2024 1229   ALT 32 07/28/2024 1229   ALKPHOS 79 07/28/2024 1229   BILITOT 0.6 07/28/2024  1229   GFRNONAA >60 07/28/2024 1229   GFRAA >90 06/08/2012 1251    Lab Results  Component Value Date   WBC 4.7 07/28/2024   NEUTROABS 2.3 07/28/2024   HGB 13.6 07/28/2024   HCT 40.0 07/28/2024   MCV 91.5 07/28/2024   PLT 414 (H) 07/28/2024      Chemistry      Component Value Date/Time   NA 140 07/28/2024 1229   K 3.8 07/28/2024 1229   CL 107 07/28/2024 1229   CO2 22 07/28/2024 1229   BUN 16 07/28/2024 1229   CREATININE 0.75 07/28/2024 1229      Component Value Date/Time   CALCIUM 9.4 07/28/2024 1229   ALKPHOS 79 07/28/2024 1229   AST 28 07/28/2024 1229   ALT 32 07/28/2024 1229   BILITOT 0.6 07/28/2024 1229       RADIOGRAPHIC STUDIES: I have personally reviewed the radiological images as listed and agreed with the findings in the report.  ECHOCARDIOGRAM COMPLETE    ECHOCARDIOGRAM REPORT       Patient Name:   ANYELY CUNNING Date of Exam: 06/30/2024 Medical Rec #:  [id number]       Height:       64.0 in Accession #:    [id number]      Weight:       180.1 lb Date of Birth:  1961/10/06       BSA:          1.871 m Patient Age:    63 years        BP:           124/80 mmHg Patient Gender: F               HR:           79 bpm. Exam Location:  Inpatient  Procedure: 2D Echo, Color Doppler and Cardiac Doppler (Both Spectral and Color            Flow Doppler were utilized during procedure).  Indications:    Chemo Z09   History:        Patient has no prior history of Echocardiogram examinations.   Sonographer:    Benard Stallion Referring Phys: [phone] SREEDHAR KATRAGADDA  IMPRESSIONS   1. Left ventricular ejection fraction, by estimation, is 55 to 60%. The left ventricle has normal function. The left ventricle has no regional wall motion abnormalities. Left ventricular diastolic parameters were normal.  2. Right ventricular systolic function is normal. The right ventricular size is normal.  3. The mitral valve is normal in structure. No evidence of mitral valve  regurgitation. No evidence of mitral stenosis.  4. The aortic valve is normal in structure. Aortic valve regurgitation is not visualized. No aortic stenosis is present.  5. The inferior vena cava is normal in size with greater than 50% respiratory variability, suggesting  right atrial pressure of 3 mmHg.  FINDINGS  Left Ventricle: Left ventricular ejection fraction, by estimation, is 55 to 60%. The left ventricle has normal function. The left ventricle has no regional wall motion abnormalities. The left ventricular internal cavity size was normal in size. There is  no left ventricular hypertrophy. Left ventricular diastolic parameters were normal.  Right Ventricle: The right ventricular size is normal. No increase in right ventricular wall thickness. Right ventricular systolic function is normal.  Left Atrium: Left atrial size was normal in size.  Right Atrium: Right atrial size was normal in size.  Pericardium: There is no evidence of pericardial effusion.  Mitral Valve: The mitral valve is normal in structure. No evidence of mitral valve regurgitation. No evidence of mitral valve stenosis.  Tricuspid Valve: The tricuspid valve is normal in structure. Tricuspid valve regurgitation is not demonstrated. No evidence of tricuspid stenosis.  Aortic Valve: The aortic valve is normal in structure. Aortic valve regurgitation is not visualized. No aortic stenosis is present. Aortic valve mean gradient measures 4.0 mmHg. Aortic valve peak gradient measures 7.6 mmHg. Aortic valve area, by VTI  measures 2.59 cm.  Pulmonic Valve: The pulmonic valve was normal in structure. Pulmonic valve regurgitation is not visualized. No evidence of pulmonic stenosis.  Aorta: The aortic root is normal in size and structure.  Venous: The inferior vena cava is normal in size with greater than 50% respiratory variability, suggesting right atrial pressure of 3 mmHg.  IAS/Shunts: No atrial level shunt detected by color  flow Doppler.    LEFT VENTRICLE PLAX 2D LVIDd:         3.70 cm     Diastology LVIDs:         2.40 cm     LV e' medial:    12.00 cm/s LV PW:         1.10 cm     LV E/e' medial:  8.2 LV IVS:        1.10 cm     LV e' lateral:   10.90 cm/s LVOT diam:     2.00 cm     LV E/e' lateral: 9.1 LV SV:         72 LV SV Index:   39 LVOT Area:     3.14 cm   LV Volumes (MOD) LV vol d, MOD A2C: 51.9 ml LV vol d, MOD A4C: 47.8 ml LV vol s, MOD A2C: 17.2 ml LV vol s, MOD A4C: 15.7 ml LV SV MOD A2C:     34.7 ml LV SV MOD A4C:     47.8 ml LV SV MOD BP:      34.1 ml  RIGHT VENTRICLE RV Basal diam:  3.10 cm RV Mid diam:    2.70 cm RV S prime:     10.20 cm/s TAPSE (M-mode): 1.8 cm  LEFT ATRIUM             Index        RIGHT ATRIUM          Index LA diam:        2.90 cm 1.55 cm/m   RA Area:     8.61 cm LA Vol (A2C):   24.8 ml 13.25 ml/m  RA Volume:   14.80 ml 7.91 ml/m LA Vol (A4C):   23.7 ml 12.67 ml/m LA Biplane Vol: 24.7 ml 13.20 ml/m  AORTIC VALVE AV Area (Vmax):    2.73 cm AV Area (Vmean):   2.61 cm AV Area (  VTI):     2.59 cm AV Vmax:           138.00 cm/s AV Vmean:          93.700 cm/s AV VTI:            0.279 m AV Peak Grad:      7.6 mmHg AV Mean Grad:      4.0 mmHg LVOT Vmax:         120.00 cm/s LVOT Vmean:        77.800 cm/s LVOT VTI:          0.230 m LVOT/AV VTI ratio: 0.82   AORTA Ao Root diam: 2.80 cm Ao Asc diam:  3.50 cm  MITRAL VALVE MV Area (PHT): 4.49 cm    SHUNTS MV Decel Time: 169 msec    Systemic VTI:  0.23 m MV E velocity: 98.80 cm/s  Systemic Diam: 2.00 cm MV A velocity: 75.80 cm/s MV E/A ratio:  1.30  Aditya Sabharwal Electronically signed by Ria Commander Signature Date/Time: 06/30/2024/4:47:53 PM      Final

## 2024-09-08 ENCOUNTER — Other Ambulatory Visit: Payer: Self-pay | Admitting: *Deleted

## 2024-09-08 ENCOUNTER — Inpatient Hospital Stay: Attending: Oncology

## 2024-09-08 ENCOUNTER — Inpatient Hospital Stay

## 2024-09-08 VITALS — BP 126/68 | HR 76 | Temp 98.6°F | Resp 18

## 2024-09-08 DIAGNOSIS — C7951 Secondary malignant neoplasm of bone: Secondary | ICD-10-CM | POA: Diagnosis present

## 2024-09-08 DIAGNOSIS — Z1731 Human epidermal growth factor receptor 2 positive status: Secondary | ICD-10-CM | POA: Diagnosis not present

## 2024-09-08 DIAGNOSIS — Z5112 Encounter for antineoplastic immunotherapy: Secondary | ICD-10-CM | POA: Diagnosis present

## 2024-09-08 DIAGNOSIS — C50919 Malignant neoplasm of unspecified site of unspecified female breast: Secondary | ICD-10-CM

## 2024-09-08 DIAGNOSIS — C50912 Malignant neoplasm of unspecified site of left female breast: Secondary | ICD-10-CM | POA: Diagnosis present

## 2024-09-08 DIAGNOSIS — Z170 Estrogen receptor positive status [ER+]: Secondary | ICD-10-CM | POA: Diagnosis not present

## 2024-09-08 DIAGNOSIS — Z1721 Progesterone receptor positive status: Secondary | ICD-10-CM | POA: Insufficient documentation

## 2024-09-08 LAB — CBC WITH DIFFERENTIAL/PLATELET
Abs Immature Granulocytes: 0.01 K/uL (ref 0.00–0.07)
Basophils Absolute: 0.1 K/uL (ref 0.0–0.1)
Basophils Relative: 1 %
Eosinophils Absolute: 0.1 K/uL (ref 0.0–0.5)
Eosinophils Relative: 2 %
HCT: 39.3 % (ref 36.0–46.0)
Hemoglobin: 13.4 g/dL (ref 12.0–15.0)
Immature Granulocytes: 0 %
Lymphocytes Relative: 34 %
Lymphs Abs: 1.7 K/uL (ref 0.7–4.0)
MCH: 31.8 pg (ref 26.0–34.0)
MCHC: 34.1 g/dL (ref 30.0–36.0)
MCV: 93.1 fL (ref 80.0–100.0)
Monocytes Absolute: 0.5 K/uL (ref 0.1–1.0)
Monocytes Relative: 10 %
Neutro Abs: 2.7 K/uL (ref 1.7–7.7)
Neutrophils Relative %: 53 %
Platelets: 389 K/uL (ref 150–400)
RBC: 4.22 MIL/uL (ref 3.87–5.11)
RDW: 15.5 % (ref 11.5–15.5)
WBC: 5.1 K/uL (ref 4.0–10.5)
nRBC: 0 % (ref 0.0–0.2)

## 2024-09-08 LAB — COMPREHENSIVE METABOLIC PANEL WITH GFR
ALT: 42 U/L (ref 0–44)
AST: 34 U/L (ref 15–41)
Albumin: 3.6 g/dL (ref 3.5–5.0)
Alkaline Phosphatase: 65 U/L (ref 38–126)
Anion gap: 12 (ref 5–15)
BUN: 14 mg/dL (ref 8–23)
CO2: 22 mmol/L (ref 22–32)
Calcium: 9 mg/dL (ref 8.9–10.3)
Chloride: 108 mmol/L (ref 98–111)
Creatinine, Ser: 0.7 mg/dL (ref 0.44–1.00)
GFR, Estimated: >60 mL/min (ref >60)
Glucose, Bld: 123 mg/dL — ABNORMAL HIGH (ref 70–99)
Potassium: 3.7 mmol/L (ref 3.5–5.1)
Sodium: 142 mmol/L (ref 135–145)
Total Bilirubin: 0.7 mg/dL (ref 0.0–1.2)
Total Protein: 6.8 g/dL (ref 6.5–8.1)

## 2024-09-08 LAB — MAGNESIUM: Magnesium: 2 mg/dL (ref 1.7–2.4)

## 2024-09-08 MED ORDER — SODIUM CHLORIDE 0.9 % IV SOLN
150.0000 mg | Freq: Once | INTRAVENOUS | Status: AC
  Administered 2024-09-08: 150 mg via INTRAVENOUS
  Filled 2024-09-08: qty 150

## 2024-09-08 MED ORDER — CETIRIZINE HCL 10 MG/ML IV SOLN
10.0000 mg | Freq: Once | INTRAVENOUS | Status: AC
  Administered 2024-09-08: 10 mg via INTRAVENOUS
  Filled 2024-09-08: qty 1

## 2024-09-08 MED ORDER — PALONOSETRON HCL INJECTION 0.25 MG/5ML
0.2500 mg | Freq: Once | INTRAVENOUS | Status: AC
  Administered 2024-09-08: 0.25 mg via INTRAVENOUS
  Filled 2024-09-08: qty 5

## 2024-09-08 MED ORDER — DEXAMETHASONE SODIUM PHOSPHATE 10 MG/ML IJ SOLN
10.0000 mg | Freq: Once | INTRAMUSCULAR | Status: AC
  Administered 2024-09-08: 10 mg via INTRAVENOUS
  Filled 2024-09-08: qty 1

## 2024-09-08 MED ORDER — ACETAMINOPHEN 325 MG PO TABS
650.0000 mg | ORAL_TABLET | Freq: Once | ORAL | Status: AC
  Administered 2024-09-08: 650 mg via ORAL
  Filled 2024-09-08: qty 2

## 2024-09-08 MED ORDER — DEXTROSE 5 % IV SOLN: INTRAVENOUS | Status: DC

## 2024-09-08 MED ORDER — FAM-TRASTUZUMAB DERUXTECAN-NXKI CHEMO 100 MG IV SOLR
5.4000 mg/kg | Freq: Once | INTRAVENOUS | Status: AC
  Administered 2024-09-08: 400 mg via INTRAVENOUS
  Filled 2024-09-08: qty 20

## 2024-09-08 NOTE — Patient Instructions (Signed)
 [facility] [place] - A DEPT OF [facility]  Discharge Instructions: Thank you for choosing [facility] to provide your oncology and hematology care.  If you have a lab appointment with the Cancer Center - please note that after April 8th, 2024, all labs will be drawn in the cancer center.  You do not have to check in or register with the main entrance as you have in the past but will complete your check-in in the cancer center.  Wear comfortable clothing and clothing appropriate for easy access to any Portacath or PICC line.   We strive to give you quality time with your provider. You may need to reschedule your appointment if you arrive late (15 or more minutes).  Arriving late affects you and other patients whose appointments are after yours.  Also, if you miss three or more appointments without notifying the office, you may be dismissed from the clinic at the provider's discretion.      For prescription refill requests, have your pharmacy contact our office and allow 72 hours for refills to be completed.    Today you received the following Enhertu , return as scheduled.   To help prevent nausea and vomiting after your treatment, we encourage you to take your nausea medication as directed.  BELOW ARE SYMPTOMS THAT SHOULD BE REPORTED IMMEDIATELY: *FEVER GREATER THAN 100.4 F (38 C) OR HIGHER *CHILLS OR SWEATING *NAUSEA AND VOMITING THAT IS NOT CONTROLLED WITH YOUR NAUSEA MEDICATION *UNUSUAL SHORTNESS OF BREATH *UNUSUAL BRUISING OR BLEEDING *URINARY PROBLEMS (pain or burning when urinating, or frequent urination) *BOWEL PROBLEMS (unusual diarrhea, constipation, pain near the anus) TENDERNESS IN MOUTH AND THROAT WITH OR WITHOUT PRESENCE OF ULCERS (sore throat, sores in mouth, or a toothache) UNUSUAL RASH, SWELLING OR PAIN  UNUSUAL VAGINAL DISCHARGE OR ITCHING   Items with * indicate a potential emergency and should be followed up as soon as possible or  go to the Emergency Department if any problems should occur.  Please show the CHEMOTHERAPY ALERT CARD or IMMUNOTHERAPY ALERT CARD at check-in to the Emergency Department and triage nurse.  Should you have questions after your visit or need to cancel or reschedule your appointment, please contact [facility] [place] - A DEPT OF [facility] [phone]  and follow the prompts.  Office hours are 8:00 a.m. to 4:30 p.m. Monday - Friday. Please note that voicemails left after 4:00 p.m. may not be returned until the following business day.  We are closed weekends and major holidays. You have access to a nurse at all times for urgent questions. Please call the main number to the clinic [phone] and follow the prompts.  For any non-urgent questions, you may also contact your provider using MyChart. We now offer e-Visits for anyone 74 and older to request care online for non-urgent symptoms. For details visit [email or web address].   Also download the MyChart app! Go to the app store, search MyChart, open the app, select [facility], and log in with your MyChart username and password.

## 2024-09-08 NOTE — Progress Notes (Signed)
 Patient's labs within normal limits. Patient tolerated therapy with no complaints voiced. Side effects with management reviewed with understanding verbalized. Port site clean and dry with no bruising or swelling noted at site. Good blood return noted before and after administration of therapy. Band aid applied. Patient left in satisfactory condition with VSS and no s/s of distress noted.

## 2024-09-09 LAB — CANCER ANTIGEN 27.29: CA 27.29: 50.3 U/mL — ABNORMAL HIGH (ref 0.0–38.6)

## 2024-09-10 ENCOUNTER — Inpatient Hospital Stay

## 2024-09-14 ENCOUNTER — Other Ambulatory Visit: Payer: Self-pay | Admitting: *Deleted

## 2024-09-14 DIAGNOSIS — C50919 Malignant neoplasm of unspecified site of unspecified female breast: Secondary | ICD-10-CM

## 2024-09-14 MED ORDER — CYCLOBENZAPRINE HCL 10 MG PO TABS: 10.0000 mg | ORAL_TABLET | Freq: Two times a day (BID) | ORAL | 5 refills | Status: AC | PRN

## 2024-09-23 ENCOUNTER — Ambulatory Visit (HOSPITAL_COMMUNITY)
Admission: RE | Admit: 2024-09-23 | Discharge: 2024-09-23 | Disposition: A | Discharge status: Home / Self Care | Source: Ambulatory Visit | Attending: Oncology | Admitting: Oncology

## 2024-09-23 ENCOUNTER — Other Ambulatory Visit: Payer: Self-pay | Admitting: Oncology

## 2024-09-23 DIAGNOSIS — C50919 Malignant neoplasm of unspecified site of unspecified female breast: Secondary | ICD-10-CM

## 2024-09-23 MED ORDER — FLUDEOXYGLUCOSE F - 18 (FDG) INJECTION
9.1900 | Freq: Once | INTRAVENOUS | Status: AC | PRN
  Administered 2024-09-23: 9.19 via INTRAVENOUS

## 2024-09-29 ENCOUNTER — Inpatient Hospital Stay

## 2024-09-29 ENCOUNTER — Inpatient Hospital Stay: Attending: Hematology | Admitting: Oncology

## 2024-09-29 DIAGNOSIS — C7951 Secondary malignant neoplasm of bone: Secondary | ICD-10-CM | POA: Insufficient documentation

## 2024-09-29 DIAGNOSIS — Z9013 Acquired absence of bilateral breasts and nipples: Secondary | ICD-10-CM | POA: Diagnosis not present

## 2024-09-29 DIAGNOSIS — Z1721 Progesterone receptor positive status: Secondary | ICD-10-CM | POA: Diagnosis not present

## 2024-09-29 DIAGNOSIS — C50919 Malignant neoplasm of unspecified site of unspecified female breast: Secondary | ICD-10-CM

## 2024-09-29 DIAGNOSIS — Z1732 Human epidermal growth factor receptor 2 negative status: Secondary | ICD-10-CM | POA: Diagnosis not present

## 2024-09-29 DIAGNOSIS — E611 Iron deficiency: Secondary | ICD-10-CM | POA: Insufficient documentation

## 2024-09-29 DIAGNOSIS — Z170 Estrogen receptor positive status [ER+]: Secondary | ICD-10-CM | POA: Insufficient documentation

## 2024-09-29 DIAGNOSIS — Z5112 Encounter for antineoplastic immunotherapy: Secondary | ICD-10-CM | POA: Insufficient documentation

## 2024-09-29 DIAGNOSIS — C773 Secondary and unspecified malignant neoplasm of axilla and upper limb lymph nodes: Secondary | ICD-10-CM | POA: Insufficient documentation

## 2024-09-29 DIAGNOSIS — Z79811 Long term (current) use of aromatase inhibitors: Secondary | ICD-10-CM | POA: Diagnosis not present

## 2024-09-29 DIAGNOSIS — D75839 Thrombocytosis, unspecified: Secondary | ICD-10-CM | POA: Diagnosis not present

## 2024-09-29 DIAGNOSIS — Z79899 Other long term (current) drug therapy: Secondary | ICD-10-CM | POA: Insufficient documentation

## 2024-09-29 DIAGNOSIS — C50412 Malignant neoplasm of upper-outer quadrant of left female breast: Secondary | ICD-10-CM | POA: Diagnosis not present

## 2024-09-29 DIAGNOSIS — M810 Age-related osteoporosis without current pathological fracture: Secondary | ICD-10-CM

## 2024-09-29 DIAGNOSIS — Z7982 Long term (current) use of aspirin: Secondary | ICD-10-CM | POA: Insufficient documentation

## 2024-09-29 DIAGNOSIS — C50912 Malignant neoplasm of unspecified site of left female breast: Secondary | ICD-10-CM | POA: Diagnosis present

## 2024-09-29 LAB — CBC WITH DIFFERENTIAL/PLATELET
Abs Immature Granulocytes: 0.02 K/uL (ref 0.00–0.07)
Basophils Absolute: 0.1 K/uL (ref 0.0–0.1)
Basophils Relative: 1 %
Eosinophils Absolute: 0.1 K/uL (ref 0.0–0.5)
Eosinophils Relative: 2 %
HCT: 40.3 % (ref 36.0–46.0)
Hemoglobin: 13.7 g/dL (ref 12.0–15.0)
Immature Granulocytes: 0 %
Lymphocytes Relative: 27 %
Lymphs Abs: 1.2 K/uL (ref 0.7–4.0)
MCH: 31.7 pg (ref 26.0–34.0)
MCHC: 34 g/dL (ref 30.0–36.0)
MCV: 93.3 fL (ref 80.0–100.0)
Monocytes Absolute: 0.5 K/uL (ref 0.1–1.0)
Monocytes Relative: 11 %
Neutro Abs: 2.7 K/uL (ref 1.7–7.7)
Neutrophils Relative %: 59 %
Platelets: 390 K/uL (ref 150–400)
RBC: 4.32 MIL/uL (ref 3.87–5.11)
RDW: 15.9 % — ABNORMAL HIGH (ref 11.5–15.5)
WBC: 4.6 K/uL (ref 4.0–10.5)
nRBC: 0 % (ref 0.0–0.2)

## 2024-09-29 LAB — COMPREHENSIVE METABOLIC PANEL WITH GFR
ALT: 40 U/L (ref 0–44)
AST: 36 U/L (ref 15–41)
Albumin: 4.1 g/dL (ref 3.5–5.0)
Alkaline Phosphatase: 77 U/L (ref 38–126)
Anion gap: 13 (ref 5–15)
BUN: 15 mg/dL (ref 8–23)
CO2: 22 mmol/L (ref 22–32)
Calcium: 9.9 mg/dL (ref 8.9–10.3)
Chloride: 109 mmol/L (ref 98–111)
Creatinine, Ser: 0.8 mg/dL (ref 0.44–1.00)
GFR, Estimated: >60 mL/min (ref >60)
Glucose, Bld: 97 mg/dL (ref 70–99)
Potassium: 3.8 mmol/L (ref 3.5–5.1)
Sodium: 143 mmol/L (ref 135–145)
Total Bilirubin: 0.3 mg/dL (ref 0.0–1.2)
Total Protein: 6.8 g/dL (ref 6.5–8.1)

## 2024-09-29 LAB — MAGNESIUM: Magnesium: 2 mg/dL (ref 1.7–2.4)

## 2024-09-29 MED ORDER — SODIUM CHLORIDE 0.9 % IV SOLN
150.0000 mg | Freq: Once | INTRAVENOUS | Status: AC
  Administered 2024-09-29: 150 mg via INTRAVENOUS
  Filled 2024-09-29: qty 150

## 2024-09-29 MED ORDER — DEXAMETHASONE SODIUM PHOSPHATE 10 MG/ML IJ SOLN
10.0000 mg | Freq: Once | INTRAMUSCULAR | Status: AC
  Administered 2024-09-29: 10 mg via INTRAVENOUS
  Filled 2024-09-29: qty 1

## 2024-09-29 MED ORDER — DENOSUMAB 120 MG/1.7ML ~~LOC~~ SOLN
120.0000 mg | Freq: Once | SUBCUTANEOUS | Status: AC
  Administered 2024-09-29: 120 mg via SUBCUTANEOUS
  Filled 2024-09-29: qty 1.7

## 2024-09-29 MED ORDER — PALONOSETRON HCL INJECTION 0.25 MG/5ML
0.2500 mg | Freq: Once | INTRAVENOUS | Status: AC
  Administered 2024-09-29: 0.25 mg via INTRAVENOUS
  Filled 2024-09-29: qty 5

## 2024-09-29 MED ORDER — FAM-TRASTUZUMAB DERUXTECAN-NXKI CHEMO 100 MG IV SOLR
5.4000 mg/kg | Freq: Once | INTRAVENOUS | Status: AC
  Administered 2024-09-29: 400 mg via INTRAVENOUS
  Filled 2024-09-29: qty 20

## 2024-09-29 MED ORDER — CETIRIZINE HCL 10 MG/ML IV SOLN
10.0000 mg | Freq: Once | INTRAVENOUS | Status: AC
  Administered 2024-09-29: 10 mg via INTRAVENOUS
  Filled 2024-09-29: qty 1

## 2024-09-29 MED ORDER — DEXTROSE 5 % IV SOLN: INTRAVENOUS | Status: DC

## 2024-09-29 MED ORDER — ACETAMINOPHEN 325 MG PO TABS
650.0000 mg | ORAL_TABLET | Freq: Once | ORAL | Status: AC
  Administered 2024-09-29: 650 mg via ORAL
  Filled 2024-09-29: qty 2

## 2024-09-29 NOTE — Progress Notes (Signed)
 Patient presents today for follow up visit with Dr. Davonna and treatment. (Enhertu , and Xgeva  injection. Vital signs and lab work are within parameters for treatment.   Enhertu  given today per MD orders. Tolerated infusion without adverse affects. Vital signs stable. No complaints at this time. Discharged from clinic ambulatory in stable condition. Alert and oriented x 3. F/U with [facility] as scheduled.

## 2024-09-29 NOTE — Patient Instructions (Signed)
 [facility] [place] - A DEPT OF [facility]  Discharge Instructions: Thank you for choosing [facility] to provide your oncology and hematology care.  If you have a lab appointment with the Cancer Center - please note that after April 8th, 2024, all labs will be drawn in the cancer center.  You do not have to check in or register with the main entrance as you have in the past but will complete your check-in in the cancer center.  Wear comfortable clothing and clothing appropriate for easy access to any Portacath or PICC line.   We strive to give you quality time with your provider. You may need to reschedule your appointment if you arrive late (15 or more minutes).  Arriving late affects you and other patients whose appointments are after yours.  Also, if you miss three or more appointments without notifying the office, you may be dismissed from the clinic at the provider's discretion.      For prescription refill requests, have your pharmacy contact our office and allow 72 hours for refills to be completed.    Today you received the following chemotherapy and/or immunotherapy agents Enhertu. Fam-Trastuzumab Deruxtecan Injection What is this medication? FAM-TRASTUZUMAB DERUXTECAN (fam-tras TOOZ eu mab DER ux TEE kan) treats some types of cancer. It works by blocking a protein that causes cancer cells to grow and multiply. This helps to slow or stop the spread of cancer cells. This medicine may be used for other purposes; ask your health care provider or pharmacist if you have questions. COMMON BRAND NAME(S): ENHERTU What should I tell my care team before I take this medication? They need to know if you have any of these conditions: Heart disease Heart failure Infection, especially a viral infection, such as chickenpox, cold sores, or herpes Liver disease Lung or breathing disease, such as asthma or COPD An unusual or allergic reaction to fam-trastuzumab  deruxtecan, other medications, foods, dyes, or preservatives Pregnant or trying to get pregnant Breast-feeding How should I use this medication? This medication is injected into a vein. It is given by your care team in a hospital or clinic setting. A special MedGuide will be given to you before each treatment. Be sure to read this information carefully each time. Talk to your care team about the use of this medication in children. Special care may be needed. Overdosage: If you think you have taken too much of this medicine contact a poison control center or emergency room at once. NOTE: This medicine is only for you. Do not share this medicine with others. What if I miss a dose? It is important not to miss your dose. Call your care team if you are unable to keep an appointment. What may interact with this medication? Interactions are not expected. This list may not describe all possible interactions. Give your health care provider a list of all the medicines, herbs, non-prescription drugs, or dietary supplements you use. Also tell them if you smoke, drink alcohol, or use illegal drugs. Some items may interact with your medicine. What should I watch for while using this medication? Visit your care team for regular checks on your progress. Tell your care team if your symptoms do not start to get better or if they get worse. This medication may increase your risk of getting an infection. Call your care team for advice if you get a fever, chills, sore throat, or other symptoms of a cold or flu. Do  not treat yourself. Try to avoid being around people who are sick. Avoid taking medications that contain aspirin, acetaminophen, ibuprofen, naproxen, or ketoprofen unless instructed by your care team. These medications may hide a fever. Be careful brushing or flossing your teeth or using a toothpick because you may get an infection or bleed more easily. If you have any dental work done, tell your dentist you  are receiving this medication. This medication may cause dry eyes and blurred vision. If you wear contact lenses, you may feel some discomfort. Lubricating eye drops may help. See your care team if the problem does not go away or is severe. Talk to your care team if you may be pregnant. Serious birth defects can occur if you take this medication during pregnancy and for 7 months after the last dose. If your partner can get pregnant, use a condom during sex while taking this medication and for 4 months after the last dose. Do not breastfeed while taking this medication and for 7 months after the last dose. This medication may cause infertility. Talk to your care team if you are concerned about your fertility. What side effects may I notice from receiving this medication? Side effects that you should report to your care team as soon as possible: Allergic reactions--skin rash, itching, hives, swelling of the face, lips, tongue, or throat Dry cough, shortness of breath or trouble breathing Infection--fever, chills, cough, sore throat, wounds that don't heal, pain or trouble when passing urine, general feeling of discomfort or being unwell Heart failure--shortness of breath, swelling of the ankles, feet, or hands, sudden weight gain, unusual weakness or fatigue Unusual bruising or bleeding Side effects that usually do not require medical attention (report these to your care team if they continue or are bothersome): Constipation Diarrhea Hair loss Muscle pain Nausea Vomiting This list may not describe all possible side effects. Call your doctor for medical advice about side effects. You may report side effects to FDA at 1-800-FDA-1088. Where should I keep my medication? This medication is given in a hospital or clinic. It will not be stored at home. NOTE: This sheet is a summary. It may not cover all possible information. If you have questions about this medicine, talk to your doctor, pharmacist, or  health care provider.  2024 Elsevier/Gold Standard (2023-08-15 00:00:00)      To help prevent nausea and vomiting after your treatment, we encourage you to take your nausea medication as directed.  BELOW ARE SYMPTOMS THAT SHOULD BE REPORTED IMMEDIATELY: *FEVER GREATER THAN 100.4 F (38 C) OR HIGHER *CHILLS OR SWEATING *NAUSEA AND VOMITING THAT IS NOT CONTROLLED WITH YOUR NAUSEA MEDICATION *UNUSUAL SHORTNESS OF BREATH *UNUSUAL BRUISING OR BLEEDING *URINARY PROBLEMS (pain or burning when urinating, or frequent urination) *BOWEL PROBLEMS (unusual diarrhea, constipation, pain near the anus) TENDERNESS IN MOUTH AND THROAT WITH OR WITHOUT PRESENCE OF ULCERS (sore throat, sores in mouth, or a toothache) UNUSUAL RASH, SWELLING OR PAIN  UNUSUAL VAGINAL DISCHARGE OR ITCHING   Items with * indicate a potential emergency and should be followed up as soon as possible or go to the Emergency Department if any problems should occur.  Please show the CHEMOTHERAPY ALERT CARD or IMMUNOTHERAPY ALERT CARD at check-in to the Emergency Department and triage nurse.  Should you have questions after your visit or need to cancel or reschedule your appointment, please contact [facility] [place] - A DEPT OF [facility]igh[facility] [phone]  and follow the prompts.  Office  hours are 8:00 a.m. to 4:30 p.m. Monday - Friday. Please note that voicemails left after 4:00 p.m. may not be returned until the following business day.  We are closed weekends and major holidays. You have access to a nurse at all times for urgent questions. Please call the main number to the clinic [phone] and follow the prompts.  For any non-urgent questions, you may also contact your provider using MyChart. We now offer e-Visits for anyone 55 and older to request care online for non-urgent symptoms. For details visit [email or web address].   Also download the MyChart app! Go to the app store, search "MyChart", open the app,  select [place], and log in with your MyChart username and password.

## 2024-09-29 NOTE — Progress Notes (Signed)
 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 female with metastatic breast cancer to the bones, ER/PR positive and HER2 negative:  Assessment and Plan Assessment & Plan Metastatic breast cancer to bone Metastatic breast cancer with bone involvement. ER/PR positive and HER2 negative. Oncology history below  Patient currently is on Enhertu  every 21 days.   - We reviewed the PET scan results together.  PET scan shows good response to Enhertu  with great response in bone lesions.  - No significant side effects from Enhertu  except for occasional rashes.  - Continue Enhertu  every 3 weeks - Repeat PET scan in December that is in 3 months - Schedule echocardiogram every three months while on Enhertu . Next one scheduled on October 23rd. -Labs reviewed today: CMP: WNL, CBC: WNL, CA 15-3: Pending - Physical exam stable.  Proceed with Enhertu  treatment today.  Return to clinic in 6 weeks for follow-up.  Chronic pain due to bone metastases Chronic pain in lower back and ribs due to bone metastases She prefers to avoid frequent use of pain medication.  - Continue Flexeril  and hydrocodone  as needed for pain management. - Monitor pain levels and adjust treatment as necessary.  Rash, likely drug-related Rash with itching and erythematous rash, likely related to Enhertu . Managed with hydrocortisone and Benadryl  creams. Rash is improving.  - Continue hydrocortisone and Benadryl  creams for rash management.  Chemotherapy-induced peripheral neuropathy Peripheral neuropathy present since initial chemotherapy, managed with Lyrica . No worsening of symptoms reported.  -  Continue Lyrica  for neuropathy management.  Iron deficiency Iron deficiency with saturation ratio less than 20. Iron levels are low but within range.  - Hold off on starting iron supplements as patient is worried about constipation - Monitor iron levels and reassess need for supplementation.  Thrombocytosis Likely secondary to iron deficiency.  Resolved at this time.   The patient understands the plans discussed today and is in agreement with them.  She knows to contact our office if she develops concerns prior to her next appointment.  40 minutes of total time was spent for this patient encounter, including preparation, review of records , face-to-face counseling with the patient and coordination of care, physical exam, and documentation of the encounter.   Mickiel Dry, MD  [facility] [place] - A DEPT OF [facility] [address] [place] [address] Dept: [phone] Dept Fax: [phone]   Orders Placed This Encounter  Procedures   ECHOCARDIOGRAM COMPLETE    Standing Status:   Future    Expected Date:   10/06/2024    Expiration Date:   09/29/2025    Where should this test be performed:   [facility]    Perflutren DEFINITY (image enhancing agent) should be administered unless hypersensitivity or allergy exist:   Administer Perflutren    Reason for exam-Echo:   Chemo  Z09     ONCOLOGY HISTORY:   I have reviewed her chart and materials related to her cancer extensively and collaborated history with the patient. 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 Baseball size lump palpated in left breast (from documentation) -04/09.2020: Mammogram: Abnormal density in the left breast measuring 94 x 60 x 60 mm. A  number of concerning lymph nodes were noted also. Skin was thickened. An ultrasound showed a 14 x 10 x 16 mm irregular mass and a number of concerning lymph nodes with cortical thickening. (from documentation) -04/14/2019: Left breast biopsy 2:00: Invasive carcinoma (ductal type, with lobular features), grade 2, total score: 6, ER positive, PR positive, HER2 negative [facility]) - 04/28/2019: [facility] germline testing: Negative -05/05/2019-09/16/2019: 4 cycles of Cyclophosphamide and dd epirubicin followed by 12 cycles of weekly paclitaxel  (from documentation) -10/02/2019: Bilateral mastectomies. Pathology:  -Right breast was benign.  Left breast showed residual multicentric invasive ductal cancer, grade 2.  Largest residual tumor mass was at least 8 cm.  Fibrosis was noted, consistent with chemotherapy effect.  Lymphovascular invasion identified.  Negative surgical margins.  3 of 10 lymph nodes showed metastases as well as 1 of 2 sentinel lymph nodes. -Sentinel node #1 had a 6 mm metastasis with extracapsular extension.  Sentinel node 2 was negative.  1 additional node with extracapsular extension.  Another node had fibrosis consistent with chemotherapy effect. -At least 5 foci of cancer were described.  Nuclear pleomorphism score 2, mitotic rate score 2, glandular differentiation score 2.  ER positive, strong intensity staining, 75% of cells and PR positive, strong intensity staining, 95% of cells.  HER-2/neu 2+ by IHC so equivocal.  Also equivocal by FISH.  HER-2 signals 5.5 and CEN 17 signals 3.7 so ratio 1.5.(from documentation, not available under media) -12/09/2019- 11/2020: Anastrazole 1mg  daily. Discontinued on progression -02/03/2020-Current: Monthly Denosumab  -02/29/2020-04/12/2020: RT to regional lymph nodes and chest wall -11/08/2020: CT chest: New sclerotic lesions noted mid thoracic spine. Previously noted left hilar lymph nodes have resolved. No new lymphadenopathy.   -11/24/2020: PET: Bilateral mastectomies with low-grade mildly heterogeneous tracer uptake in the left anterior chest wall reflecting post radiation/treatment changes.  Widespread osseous metastasis in T7, T8, T9, T12, L2, L3, L4, L5, midsternum, right acetabulum, right femoral neck, right proximal femur, left acetabulum, right anterior fifth rib. The most intense uptake is seen in the right L4 vertebral body with 10.4 SUV. No pathologic fracture is appreciated at this time.  -12/05/2020- 07/23/2023: Palbociclib  + Fulvestrant . Ibrance  was dose reduced to 100 mg 3 weeks on/1 week off on 10/11/2021 due to fatigue.  Discontinued due to progression  -04/13/2021: PET: Widespread hypermetabolic osseous metastatic disease on prior exam shows a marked interval metabolic improvement. On today's exam no metabolic activity is demonstrated above normal osseous background. Some of the sclerotic lesions do show some progression with regard to size and density.  -07/06/2021: CA 15-3:34, CA 27.29:48.7 -08/10/2021: PET: No evidence of hypermetabolic metastatic disease. Widespread scattered sclerotic bone metastases in the spine and pelvis are stable and not metabolically active. No lymphadenopathy.  -03/21/2022: CT CAP: No evidence of lymphadenopathy or soft tissue metastatic disease in the chest, abdomen, or pelvis. Unchanged sclerotic osseous metastatic disease involving the vertebral bodies, bony pelvis, and right femoral neck. Status post left mastectomy. -03/21/2022: NM bone scan: Increased radiotracer uptake in the thoracolumbar spine and RIGHT anterolateral fifth rib consistent with known osseous metastatic disease -09/16/2022: NM bone scan: Right mid rib focus of increased activity again noted. Thoracic and lumbar spine focal areas of increased activity again noted. These findings are consistent with known metastatic  disease. Similar findings noted on prior exam. A tiny new focus of increased activity in an upper  lumbar vertebral body cannot be excluded. -10/04/2022: CT CAP: Unchanged sclerotic osseous metastatic disease involving the thoracic spine, lumbar spine, pelvis, and right femoral neck. No evidence of new or progressive metastatic disease in the chest, abdomen or pelvis. -04/02/2023: NM bone scan: Thoracolumbar and right rib metastases, unchanged. -04/07/2023: CA 15-3:35.4, CA 27.29:39 -04/07/2023: CT CAP: Unchanged sclerotic osseous metastases throughout the ribs, thoracic and lumbar vertebral bodies, pelvis, and right femoral neck. No evidence of new metastatic disease in the chest, abdomen, or pelvis. Status post bilateral mastectomy.  -07/17/2023:PET: Intense metabolic activity associated with sclerotic lesions in the T7 and T9 vertebral bodies consistent with active metastatic disease (breast cancer skeletal metastasis recurrence). No metabolic activity associated with sclerotic lesion in the T8 or L4 vertebral body. Mild activity associated with sclerotic lesion in the posterior RIGHT acetabulum. No soft tissue metastasis.  No lymphadenopathy -08/07/2023-10/20/2023: Ribociclib  + exemestane . Held for rash -10/30/2023: Stable exam. Persistent increased radiotracer uptake within the sclerotic T9 and T7 vertebral metastasis. No new or progressive sites of disease identified. -01/16/2024: Letrozole  2.5mg  daily. Discontinued after 1 dose due to irritation of the throat and tongue.  -02/03/2024-06/16/2024:Elacestrant  258 mg daily. Discontinued due to progression  -06/03/2024: CA 15-3:45.7, CA 27.29:67.3 -06/10/2024: EZU:Emnhmzddpnw of skeletal metastasis with multiple new hypermetabolic lesions in the pelvis, spine and there are approximately 20 skeletal lesions now. Previously 2 distinct thoracic spine lesions.Stable lesions in the thoracic spine. No evidence of visceral metastasis. -06/30/2024: ESR1 D538G. TMB: 13.29mut/Mb, MSI-H: not detected, HRD: Negative. -06/30/2024: ECHO:  LVEF:55-60% -07/07/2024-Current: Enhertu  5.4mg /kg every 21 days -08/18/2024: CA 15-3:36, CA 27.29:NA  -09/08/2024: CA 27.29: 50.3 - 09/23/2024: PET scan: Marked interval improvement in skeletal metastasis. Majority of the skeletal metastasis have resolved completely. Two hypermetabolic lesions remain in the RIGHT sacrum and RIGHT pubic ramus. No evidence of soft tissue metastasis.   Current Treatment:  Enhertu  5.4mg /kg every 21 days   INTERVAL HISTORY:    Discussed the use of AI scribe software for clinical note transcription with the patient, who gave verbal consent to proceed.  History of Present Illness VELLA COLQUITT is a 63 year old female with metastatic breast cancer who presents for follow-up and evaluation of treatment response.  She has a history of breast cancer with metastasis to the bones and is currently undergoing treatment with Enhertu .   She experiences a rash with red spots following her last treatment. The rash is itchy, and she uses hydrocortisone and Benadryl  creams for relief. The rash has improved but was initially very red and itchy. Additionally, she had a small spot over her eyebrow that resolved on its own.  She reports muscle pain from her back down to her calf, described as a constant ache. The pain sometimes worsens, and she uses Flexeril  and hydrocodone  for severe pain, although she tries to avoid taking them frequently. She also experiences numbness and tingling since her first round of chemotherapy, for which she takes Lyrica .  She has a history of shortness of breath and occasional cough since her initial chemotherapy treatment. No new shortness of breath or cough. She also mentions a tender spot at the bottom of her sternum that is sometimes sore.   I have reviewed the past medical history, past surgical history, social history and family history with the patient and they are unchanged from previous note.  ALLERGIES:  is allergic to augmentin  [amoxicillin-pot clavulanate], ciprofloxacin, claritin [  loratadine], gabapentin, keflex [cephalexin], levofloxacin, lisinopril, lisinopril, metoprolol, metoprolol, nexium [esomeprazole], sulfa antibiotics, tetracyclines & related, iohexol , ultram  [tramadol ], anastrozole , ceftin [cefuroxime axetil], chromium, exemestane , iodinated contrast media, kisqali  (200 mg dose) [ribociclib  succ (200 mg dose)], misc. sulfonamide containing compounds, oxycodone, sulfa antibiotics, latex, and nickel.  MEDICATIONS:  Current Outpatient Medications  Medication Sig Dispense Refill   amLODipine  (NORVASC ) 5 MG tablet Take 1 tablet by mouth once daily 90 tablet 0   Ascorbic Acid (VITAMIN C) 1000 MG tablet Take 1,000 mg by mouth daily.     aspirin  81 MG EC tablet Take 81 mg by mouth daily.     betamethasone  dipropionate 0.05 % cream Apply topically 2 (two) times daily. 30 g 1   Calcium-Magnesium-Vitamin D  (CALCIUM 1200+D3 PO) Take 1 tablet by mouth daily.     cetirizine -pseudoephedrine (ZYRTEC -D) 5-120 MG tablet Take 1 tablet by mouth daily.     CINNAMON PO Take 1,000 mg by mouth daily.     cyanocobalamin  1000 MCG tablet Take 1 tablet (1,000 mcg total) by mouth daily. (Patient taking differently: Take 1,000 mcg by mouth every other day.) 30 tablet 6   cyclobenzaprine  (FLEXERIL ) 10 MG tablet Take 1 tablet (10 mg total) by mouth 2 (two) times daily as needed for muscle spasms. 60 tablet 5   denosumab  (XGEVA ) 120 MG/1.7ML SOLN injection Inject 120 mg into the skin every 30 (thirty) days.     furosemide  (LASIX ) 20 MG tablet Take 1 tablet (20 mg total) by mouth daily as needed for edema (Take daily as needed for edema). 30 tablet 2   HYDROcodone -acetaminophen  (NORCO/VICODIN) 5-325 MG tablet Take 1 tablet by mouth every 8 (eight) hours as needed for moderate pain (pain score 4-6). 90 tablet 0   lansoprazole  (PREVACID ) 30 MG capsule Take 1 capsule (30 mg total) by mouth 2 (two) times daily. TAKE 1 CAPSULE BY MOUTH IN THE  MORNING AND AT BEDTIME 180 capsule 3   lidocaine -prilocaine  (EMLA ) cream Apply 1 Application topically as needed (Apply to port site prior to port access). 30 g 6   linaclotide  (LINZESS ) 72 MCG capsule Take 1 capsule (72 mcg total) by mouth daily before breakfast. 90 capsule 3   Misc. Devices MISC Please provide patient with left arm compression sleeve. Diagnosis metastatic breast cancer, bilateral mastectomy, left arm lymphedema 1 each 6   Misc. Devices MISC Please provide with prosthetic mastectomy bra 1 each 6   NAPHCON-A 0.025-0.3 % ophthalmic solution Place 2 drops into both eyes in the morning and at bedtime.     pregabalin  (LYRICA ) 25 MG capsule Take 1 capsule by mouth twice daily (Patient taking differently: Take 25 mg by mouth 2 (two) times daily. As needed.) 60 capsule 3   prochlorperazine  (COMPAZINE ) 10 MG tablet Take 1 tablet (10 mg total) by mouth every 6 (six) hours as needed for nausea or vomiting. 60 tablet 2   Triamcinolone  Acetonide (TRIAMCINOLONE  0.1 % CREAM : EUCERIN) CREA Apply 1 Application topically 3 (three) times daily as needed. 1 each 0   triamcinolone  cream (KENALOG ) 0.1 % Apply 1 Application topically 2 (two) times daily. 30 g 0   No current facility-administered medications for this visit.    REVIEW OF SYSTEMS:   Constitutional: Denies fevers, chills or abnormal weight loss Eyes: Denies blurriness of vision Ears, nose, mouth, throat, and face: Denies mucositis or sore throat Respiratory: Denies cough, dyspnea or wheezes Cardiovascular: Denies palpitation, chest discomfort or lower extremity swelling Gastrointestinal:  Denies nausea, heartburn or change in bowel  habits Skin: Denies abnormal skin rashes Lymphatics: Denies new lymphadenopathy or easy bruising Neurological:Denies numbness, tingling or new weaknesses Behavioral/Psych: Mood is stable, no new changes  All other systems were reviewed with the patient and are negative.   VITALS:  Last menstrual  period 06/08/2012.  Wt Readings from Last 3 Encounters:  09/29/24 175 lb 12.8 oz (79.7 kg)  09/08/24 177 lb 0.5 oz (80.3 kg)  08/18/24 177 lb 9.6 oz (80.6 kg)    There is no height or weight on file to calculate BMI.  Performance status (ECOG): 1 - Symptomatic but completely ambulatory  PHYSICAL EXAM:   GENERAL:alert, no distress and comfortable SKIN: skin color, texture, turgor are normal, no rashes or significant lesions BREAST: Bilateral mastectomy. No masses palpated. No lymphadenopathy palpated LYMPH:  no palpable lymphadenopathy in the cervical, axillary or inguinal LUNGS: clear to auscultation and percussion with normal breathing effort HEART: regular rate & rhythm and no murmurs and no lower extremity edema ABDOMEN:abdomen soft, non-tender and normal bowel sounds Musculoskeletal:no cyanosis of digits and no clubbing  NEURO: alert & oriented x 3 with fluent speech, no focal motor/sensory deficits  LABORATORY DATA:  I have reviewed the data as listed    Component Value Date/Time   NA 143 09/29/2024 0930   K 3.8 09/29/2024 0930   CL 109 09/29/2024 0930   CO2 22 09/29/2024 0930   GLUCOSE 97 09/29/2024 0930   BUN 15 09/29/2024 0930   CREATININE 0.80 09/29/2024 0930   CALCIUM 9.9 09/29/2024 0930   PROT 6.8 09/29/2024 0930   ALBUMIN 4.1 09/29/2024 0930   AST 36 09/29/2024 0930   ALT 40 09/29/2024 0930   ALKPHOS 77 09/29/2024 0930   BILITOT 0.3 09/29/2024 0930   GFRNONAA >60 09/29/2024 0930   GFRAA >90 06/08/2012 1251    Lab Results  Component Value Date   WBC 4.6 09/29/2024   NEUTROABS 2.7 09/29/2024   HGB 13.7 09/29/2024   HCT 40.3 09/29/2024   MCV 93.3 09/29/2024   PLT 390 09/29/2024      Chemistry      Component Value Date/Time   NA 143 09/29/2024 0930   K 3.8 09/29/2024 0930   CL 109 09/29/2024 0930   CO2 22 09/29/2024 0930   BUN 15 09/29/2024 0930   CREATININE 0.80 09/29/2024 0930      Component Value Date/Time   CALCIUM 9.9 09/29/2024 0930    ALKPHOS 77 09/29/2024 0930   AST 36 09/29/2024 0930   ALT 40 09/29/2024 0930   BILITOT 0.3 09/29/2024 0930      Latest Reference Range & Units 08/18/24 09:08  Iron 28 - 170 ug/dL 76  UIBC ug/dL 682  TIBC 749 - 549 ug/dL 606  Saturation Ratios 10.4 - 31.8 % 19  Ferritin 11 - 307 ng/mL 37  Folate >5.9 ng/mL 17.0  Vitamin B12 180 - 914 pg/mL 654    Latest Reference Range & Units 08/18/24 09:08  CA 15-3 0.0 - 25.0 U/mL 36.0 (H)  (H): Data is abnormally high  RADIOGRAPHIC STUDIES: I have personally reviewed the radiological report as listed below  NM PET Image Restag (PS) Skull Base To Thigh CLINICAL DATA:  Subsequent treatment strategy for breast carcinoma.  EXAM: NUCLEAR MEDICINE PET SKULL BASE TO THIGH  TECHNIQUE: 9.9 mCi F-18 FDG was injected intravenously. Full-ring PET imaging was performed from the skull base to thigh after the radiotracer. CT data was obtained and used for attenuation correction and anatomic localization.  Fasting blood glucose: 111  mg/dl  COMPARISON:  PET-CT 93/87/7974  FINDINGS: NECK: No hypermetabolic lymph nodes in the neck.  Incidental CT findings: None.  CHEST: No hypermetabolic mediastinal or hilar nodes. No suspicious pulmonary nodules on the CT scan. Subpleural thickening in the LEFT upper lobe related to radiation change. Calcified granulomas in the lung and hila.  Incidental CT findings: Bilateral mastectomy anatomy. Port in the anterior chest wall with tip in distal SVC.  ABDOMEN/PELVIS: No abnormal hypermetabolic activity within the liver, pancreas, adrenal glands, or spleen. No hypermetabolic lymph nodes in the abdomen or pelvis.  Incidental CT findings: None.  SKELETON: Marked interval improvement in skeletal metastasis. Majority of the thoracic and lumbar lesions have resolved completely. Interval resolution lesions within the pelvis. Only 2 hypermetabolic lesions remain where previously there were proximally 15.  Hypermetabolic lesion in the RIGHT sacrum with SUV max equal 5.0 on image 124. Lesion inferior RIGHT pubic ramus with SUV max equal 6.3 on image 151. Both lesions above are present on comparison exam.  Incidental CT findings: None.  IMPRESSION: 1. Marked interval improvement in skeletal metastasis. Majority of the skeletal metastasis have resolved completely. Two hypermetabolic lesions remain in the RIGHT sacrum and RIGHT pubic ramus. 2. No evidence of soft tissue metastasis.  Electronically Signed   By: Jackquline Boxer M.D.   On: 09/25/2024 17:52

## 2024-09-29 NOTE — Progress Notes (Signed)
 Wendy Zamora presents today for injection per the provider's orders.  Xgeva  administration without incident; injection site WNL; see MAR for injection details.  Patient tolerated procedure well and without incident.  No questions or complaints noted at this time.

## 2024-09-29 NOTE — Patient Instructions (Signed)
 [facility] - A DEPT OF [facility]  Discharge Instructions: Thank you for choosing [facility] to provide your oncology and hematology care.  If you have a lab appointment with the Cancer Center - please note that after April 8th, 2024, all labs will be drawn in the cancer center.  You do not have to check in or register with the main entrance as you have in the past but will complete your check-in in the cancer center.  Wear comfortable clothing and clothing appropriate for easy access to any Portacath or PICC line.   We strive to give you quality time with your provider. You may need to reschedule your appointment if you arrive late (15 or more minutes).  Arriving late affects you and other patients whose appointments are after yours.  Also, if you miss three or more appointments without notifying the office, you may be dismissed from the clinic at the provider's discretion.      For prescription refill requests, have your pharmacy contact our office and allow 72 hours for refills to be completed.    Today you received the following chemotherapy and/or immunotherapy agents Xgeva injection. Denosumab Injection (Oncology) What is this medication? DENOSUMAB (den oh SUE mab) prevents weakened bones caused by cancer. It may also be used to treat noncancerous bone tumors that cannot be removed by surgery. It can also be used to treat high calcium levels in the blood caused by cancer. It works by blocking a protein that causes bones to break down quickly. This slows down the release of calcium from bones, which lowers calcium levels in your blood. It also makes your bones stronger and less likely to break (fracture). This medicine may be used for other purposes; ask your health care provider or pharmacist if you have questions. COMMON BRAND NAME(S): XGEVA What should I tell my care team before I take this medication? They need to know if you have any of  these conditions: Dental disease Having surgery or tooth extraction Infection Kidney disease Low levels of calcium or vitamin D in the blood Malnutrition On hemodialysis Skin conditions or sensitivity Thyroid or parathyroid disease An unusual reaction to denosumab, other medications, foods, dyes, or preservatives Pregnant or trying to get pregnant Breast-feeding How should I use this medication? This medication is for injection under the skin. It is given by your care team in a hospital or clinic setting. A special MedGuide will be given to you before each treatment. Be sure to read this information carefully each time. Talk to your care team about the use of this medication in children. While it may be prescribed for children as young as 13 years for selected conditions, precautions do apply. Overdosage: If you think you have taken too much of this medicine contact a poison control center or emergency room at once. NOTE: This medicine is only for you. Do not share this medicine with others. What if I miss a dose? Keep appointments for follow-up doses. It is important not to miss your dose. Call your care team if you are unable to keep an appointment. What may interact with this medication? Do not take this medication with any of the following: Other medications containing denosumab This medication may also interact with the following: Medications that lower your chance of fighting infection Steroid medications, such as prednisone or cortisone This list may not describe all possible interactions. Give your health care provider a list of all the  medicines, herbs, non-prescription drugs, or dietary supplements you use. Also tell them if you smoke, drink alcohol, or use illegal drugs. Some items may interact with your medicine. What should I watch for while using this medication? Your condition will be monitored carefully while you are receiving this medication. You may need blood work  while taking this medication. This medication may increase your risk of getting an infection. Call your care team for advice if you get a fever, chills, sore throat, or other symptoms of a cold or flu. Do not treat yourself. Try to avoid being around people who are sick. You should make sure you get enough calcium and vitamin D while you are taking this medication, unless your care team tells you not to. Discuss the foods you eat and the vitamins you take with your care team. Some people who take this medication have severe bone, joint, or muscle pain. This medication may also increase your risk for jaw problems or a broken thigh bone. Tell your care team right away if you have severe pain in your jaw, bones, joints, or muscles. Tell your care team if you have any pain that does not go away or that gets worse. Talk to your care team if you may be pregnant. Serious birth defects can occur if you take this medication during pregnancy and for 5 months after the last dose. You will need a negative pregnancy test before starting this medication. Contraception is recommended while taking this medication and for 5 months after the last dose. Your care team can help you find the option that works for you. What side effects may I notice from receiving this medication? Side effects that you should report to your care team as soon as possible: Allergic reactions--skin rash, itching, hives, swelling of the face, lips, tongue, or throat Bone, joint, or muscle pain Low calcium level--muscle pain or cramps, confusion, tingling, or numbness in the hands or feet Osteonecrosis of the jaw--pain, swelling, or redness in the mouth, numbness of the jaw, poor healing after dental work, unusual discharge from the mouth, visible bones in the mouth Side effects that usually do not require medical attention (report to your care team if they continue or are bothersome): Cough Diarrhea Fatigue Headache Nausea This list may not  describe all possible side effects. Call your doctor for medical advice about side effects. You may report side effects to FDA at 1-800-FDA-1088. Where should I keep my medication? This medication is given in a hospital or clinic. It will not be stored at home. NOTE: This sheet is a summary. It may not cover all possible information. If you have questions about this medicine, talk to your doctor, pharmacist, or health care provider.  2024 Elsevier/Gold Standard (2022-05-08 00:00:00)      To help prevent nausea and vomiting after your treatment, we encourage you to take your nausea medication as directed.  BELOW ARE SYMPTOMS THAT SHOULD BE REPORTED IMMEDIATELY: *FEVER GREATER THAN 100.4 F (38 C) OR HIGHER *CHILLS OR SWEATING *NAUSEA AND VOMITING THAT IS NOT CONTROLLED WITH YOUR NAUSEA MEDICATION *UNUSUAL SHORTNESS OF BREATH *UNUSUAL BRUISING OR BLEEDING *URINARY PROBLEMS (pain or burning when urinating, or frequent urination) *BOWEL PROBLEMS (unusual diarrhea, constipation, pain near the anus) TENDERNESS IN MOUTH AND THROAT WITH OR WITHOUT PRESENCE OF ULCERS (sore throat, sores in mouth, or a toothache) UNUSUAL RASH, SWELLING OR PAIN  UNUSUAL VAGINAL DISCHARGE OR ITCHING   Items with * indicate a potential emergency and should be followed up as  soon as possible or go to the Emergency Department if any problems should occur.  Please show the CHEMOTHERAPY ALERT CARD or IMMUNOTHERAPY ALERT CARD at check-in to the Emergency Department and triage nurse.  Should you have questions after your visit or need to cancel or reschedule your appointment, please contact [facility] [place] - A DEPT OF [facility] [phone]  and follow the prompts.  Office hours are 8:00 a.m. to 4:30 p.m. Monday - Friday. Please note that voicemails left after 4:00 p.m. may not be returned until the following business day.  We are closed weekends and major holidays. You have access to a nurse at  all times for urgent questions. Please call the main number to the clinic [phone] and follow the prompts.  For any non-urgent questions, you may also contact your provider using MyChart. We now offer e-Visits for anyone 96 and older to request care online for non-urgent symptoms. For details visit [email or web address].   Also download the MyChart app! Go to the app store, search "MyChart", open the app, select [facility], and log in with your MyChart username and password.

## 2024-09-30 ENCOUNTER — Other Ambulatory Visit: Payer: Self-pay | Admitting: *Deleted

## 2024-09-30 LAB — CANCER ANTIGEN 15-3: CA 15-3: 35.1 U/mL — ABNORMAL HIGH (ref 0.0–25.0)

## 2024-09-30 MED ORDER — PREGABALIN 25 MG PO CAPS: 25.0000 mg | ORAL_CAPSULE | Freq: Two times a day (BID) | ORAL | 3 refills | Status: AC

## 2024-10-01 ENCOUNTER — Inpatient Hospital Stay

## 2024-10-13 ENCOUNTER — Encounter (INDEPENDENT_AMBULATORY_CARE_PROVIDER_SITE_OTHER): Payer: Self-pay | Admitting: Gastroenterology

## 2024-10-18 ENCOUNTER — Other Ambulatory Visit: Payer: Self-pay | Admitting: *Deleted

## 2024-10-20 ENCOUNTER — Inpatient Hospital Stay

## 2024-10-20 VITALS — BP 115/62 | HR 77 | Temp 96.6°F | Resp 20

## 2024-10-20 DIAGNOSIS — C50919 Malignant neoplasm of unspecified site of unspecified female breast: Secondary | ICD-10-CM

## 2024-10-20 DIAGNOSIS — Z5112 Encounter for antineoplastic immunotherapy: Secondary | ICD-10-CM | POA: Diagnosis not present

## 2024-10-20 LAB — CBC WITH DIFFERENTIAL/PLATELET
Abs Immature Granulocytes: 0.02 K/uL (ref 0.00–0.07)
Basophils Absolute: 0.1 K/uL (ref 0.0–0.1)
Basophils Relative: 1 %
Eosinophils Absolute: 0.1 K/uL (ref 0.0–0.5)
Eosinophils Relative: 2 %
HCT: 39.8 % (ref 36.0–46.0)
Hemoglobin: 13.5 g/dL (ref 12.0–15.0)
Immature Granulocytes: 1 %
Lymphocytes Relative: 30 %
Lymphs Abs: 1.2 K/uL (ref 0.7–4.0)
MCH: 32.2 pg (ref 26.0–34.0)
MCHC: 33.9 g/dL (ref 30.0–36.0)
MCV: 95 fL (ref 80.0–100.0)
Monocytes Absolute: 0.5 K/uL (ref 0.1–1.0)
Monocytes Relative: 11 %
Neutro Abs: 2.3 K/uL (ref 1.7–7.7)
Neutrophils Relative %: 55 %
Platelets: 367 K/uL (ref 150–400)
RBC: 4.19 MIL/uL (ref 3.87–5.11)
RDW: 15.8 % — ABNORMAL HIGH (ref 11.5–15.5)
WBC: 4.2 K/uL (ref 4.0–10.5)
nRBC: 0 % (ref 0.0–0.2)

## 2024-10-20 LAB — COMPREHENSIVE METABOLIC PANEL WITH GFR
ALT: 40 U/L (ref 0–44)
AST: 36 U/L (ref 15–41)
Albumin: 4 g/dL (ref 3.5–5.0)
Alkaline Phosphatase: 72 U/L (ref 38–126)
Anion gap: 10 (ref 5–15)
BUN: 12 mg/dL (ref 8–23)
CO2: 24 mmol/L (ref 22–32)
Calcium: 9.4 mg/dL (ref 8.9–10.3)
Chloride: 107 mmol/L (ref 98–111)
Creatinine, Ser: 0.7 mg/dL (ref 0.44–1.00)
GFR, Estimated: >60 mL/min (ref >60)
Glucose, Bld: 101 mg/dL — ABNORMAL HIGH (ref 70–99)
Potassium: 3.7 mmol/L (ref 3.5–5.1)
Sodium: 142 mmol/L (ref 135–145)
Total Bilirubin: 0.3 mg/dL (ref 0.0–1.2)
Total Protein: 6.7 g/dL (ref 6.5–8.1)

## 2024-10-20 LAB — MAGNESIUM: Magnesium: 2.1 mg/dL (ref 1.7–2.4)

## 2024-10-20 MED ORDER — SODIUM CHLORIDE 0.9 % IV SOLN
150.0000 mg | Freq: Once | INTRAVENOUS | Status: AC
  Administered 2024-10-20: 150 mg via INTRAVENOUS
  Filled 2024-10-20: qty 150

## 2024-10-20 MED ORDER — PALONOSETRON HCL INJECTION 0.25 MG/5ML
0.2500 mg | Freq: Once | INTRAVENOUS | Status: AC
  Administered 2024-10-20: 0.25 mg via INTRAVENOUS
  Filled 2024-10-20: qty 5

## 2024-10-20 MED ORDER — DEXAMETHASONE SOD PHOSPHATE PF 10 MG/ML IJ SOLN
10.0000 mg | Freq: Once | INTRAMUSCULAR | Status: AC
  Administered 2024-10-20: 10 mg via INTRAVENOUS

## 2024-10-20 MED ORDER — CETIRIZINE HCL 10 MG/ML IV SOLN
10.0000 mg | Freq: Once | INTRAVENOUS | Status: AC
  Administered 2024-10-20: 10 mg via INTRAVENOUS
  Filled 2024-10-20: qty 1

## 2024-10-20 MED ORDER — FAM-TRASTUZUMAB DERUXTECAN-NXKI CHEMO 100 MG IV SOLR
5.4000 mg/kg | Freq: Once | INTRAVENOUS | Status: AC
  Administered 2024-10-20: 400 mg via INTRAVENOUS
  Filled 2024-10-20: qty 20

## 2024-10-20 MED ORDER — ACETAMINOPHEN 325 MG PO TABS
650.0000 mg | ORAL_TABLET | Freq: Once | ORAL | Status: AC
  Administered 2024-10-20: 650 mg via ORAL
  Filled 2024-10-20: qty 2

## 2024-10-20 MED ORDER — DEXTROSE 5 % IV SOLN: INTRAVENOUS | Status: DC

## 2024-10-20 NOTE — Progress Notes (Signed)
 Labs reviewed today. No new issues to report, 2DECHO will be done tomorrow. Ok to treat as planned  Treatment given per orders. Patient tolerated it well without problems. Vitals stable and discharged home from clinic ambulatory. Follow up as scheduled.

## 2024-10-20 NOTE — Patient Instructions (Signed)
 [facility] [place] - A DEPT OF [facility]  Discharge Instructions: Thank you for choosing [facility] to provide your oncology and hematology care.  If you have a lab appointment with the Cancer Center - please note that after April 8th, 2024, all labs will be drawn in the cancer center.  You do not have to check in or register with the main entrance as you have in the past but will complete your check-in in the cancer center.  Wear comfortable clothing and clothing appropriate for easy access to any Portacath or PICC line.   We strive to give you quality time with your provider. You may need to reschedule your appointment if you arrive late (15 or more minutes).  Arriving late affects you and other patients whose appointments are after yours.  Also, if you miss three or more appointments without notifying the office, you may be dismissed from the clinic at the provider's discretion.      For prescription refill requests, have your pharmacy contact our office and allow 72 hours for refills to be completed.    Today you received the following chemotherapy and/or immunotherapy agents Enhertu   To help prevent nausea and vomiting after your treatment, we encourage you to take your nausea medication as directed.  BELOW ARE SYMPTOMS THAT SHOULD BE REPORTED IMMEDIATELY: *FEVER GREATER THAN 100.4 F (38 C) OR HIGHER *CHILLS OR SWEATING *NAUSEA AND VOMITING THAT IS NOT CONTROLLED WITH YOUR NAUSEA MEDICATION *UNUSUAL SHORTNESS OF BREATH *UNUSUAL BRUISING OR BLEEDING *URINARY PROBLEMS (pain or burning when urinating, or frequent urination) *BOWEL PROBLEMS (unusual diarrhea, constipation, pain near the anus) TENDERNESS IN MOUTH AND THROAT WITH OR WITHOUT PRESENCE OF ULCERS (sore throat, sores in mouth, or a toothache) UNUSUAL RASH, SWELLING OR PAIN  UNUSUAL VAGINAL DISCHARGE OR ITCHING   Items with * indicate a potential emergency and should be followed up as  soon as possible or go to the Emergency Department if any problems should occur.  Please show the CHEMOTHERAPY ALERT CARD or IMMUNOTHERAPY ALERT CARD at check-in to the Emergency Department and triage nurse.  Should you have questions after your visit or need to cancel or reschedule your appointment, please contact [facility] [place] - A DEPT OF [facility] [phone]  and follow the prompts.  Office hours are 8:00 a.m. to 4:30 p.m. Monday - Friday. Please note that voicemails left after 4:00 p.m. may not be returned until the following business day.  We are closed weekends and major holidays. You have access to a nurse at all times for urgent questions. Please call the main number to the clinic [phone] and follow the prompts.  For any non-urgent questions, you may also contact your provider using MyChart. We now offer e-Visits for anyone 41 and older to request care online for non-urgent symptoms. For details visit [email or web address].   Also download the MyChart app! Go to the app store, search "MyChart", open the app, select , and log in with your MyChart username and password.

## 2024-10-21 ENCOUNTER — Ambulatory Visit (HOSPITAL_COMMUNITY)
Admission: RE | Admit: 2024-10-21 | Discharge: 2024-10-21 | Disposition: A | Discharge status: Home / Self Care | Source: Ambulatory Visit | Attending: Oncology | Admitting: Oncology

## 2024-10-21 DIAGNOSIS — Z0189 Encounter for other specified special examinations: Secondary | ICD-10-CM

## 2024-10-21 DIAGNOSIS — C50919 Malignant neoplasm of unspecified site of unspecified female breast: Secondary | ICD-10-CM | POA: Diagnosis not present

## 2024-10-21 LAB — CANCER ANTIGEN 27.29: CA 27.29: 44.5 U/mL — ABNORMAL HIGH (ref 0.0–38.6)

## 2024-10-21 LAB — CANCER ANTIGEN 15-3: CA 15-3: 35.2 U/mL — ABNORMAL HIGH (ref 0.0–25.0)

## 2024-10-21 LAB — ECHOCARDIOGRAM COMPLETE
Area-P 1/2: 3.72 cm2
S' Lateral: 2.5 cm

## 2024-10-21 NOTE — Progress Notes (Signed)
*  PRELIMINARY RESULTS* Echocardiogram 2D Echocardiogram has been performed.  Wendy Zamora 10/21/2024, 10:12 AM

## 2024-10-22 ENCOUNTER — Other Ambulatory Visit: Payer: Self-pay

## 2024-10-22 MED ORDER — HYDROCODONE-ACETAMINOPHEN 5-325 MG PO TABS: 1.0000 | ORAL_TABLET | Freq: Three times a day (TID) | ORAL | 0 refills | Status: AC | PRN

## 2024-10-24 ENCOUNTER — Other Ambulatory Visit: Payer: Self-pay

## 2024-11-02 ENCOUNTER — Telehealth: Payer: Self-pay | Admitting: *Deleted

## 2024-11-02 ENCOUNTER — Other Ambulatory Visit: Payer: Self-pay | Admitting: Oncology

## 2024-11-02 MED ORDER — AZITHROMYCIN 250 MG PO TABS: ORAL_TABLET | ORAL | 0 refills | Status: DC

## 2024-11-02 NOTE — Telephone Encounter (Addendum)
 Patient called with 2 day history of sore throat and earache.  Denies fever at this time.  Instructed to take COVI/Flu test and call back with results.  Patient followed up and testing was negative. Consulted with Delon Hope, NP and Z-Pack was called into pharmacy.  Patient aware and verbalized understanding.  Will contact us  if symptoms worsen.

## 2024-11-02 NOTE — Progress Notes (Signed)
 Re: Cough, ST and Ear ache  Covid, Flu negative.  Reports subjective fever.  Onset > 5 days.  Discussed trying azithromycin 200 mg tablets.  Take 2 tabs on day 1 followed by 1 tablet until complete. Return to clinic if no improvement of symptoms.  Rx azithromycin sent to pharmacy.  Delon Hope, NP 11/02/2024 3:59 PM

## 2024-11-03 ENCOUNTER — Other Ambulatory Visit: Payer: Self-pay | Admitting: Oncology

## 2024-11-03 DIAGNOSIS — C50919 Malignant neoplasm of unspecified site of unspecified female breast: Secondary | ICD-10-CM

## 2024-11-09 NOTE — Progress Notes (Signed)
 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 female with metastatic breast cancer to the bones, ER/PR positive and HER2 negative:  Assessment and Plan Assessment & Plan Metastatic breast cancer to bone Metastatic breast cancer with bone involvement. ER/PR positive and HER2 negative. Oncology history below  Patient currently is on Enhertu  every 21 days.   - We reviewed the PET scan results together.  PET scan shows good response to Enhertu  with great response in bone lesions.  - No significant side effects from Enhertu  except for occasional rashes.  - Continue Enhertu  every 3 weeks - Repeat PET scan in December that is in 3 months - Schedule echocardiogram every three months while on Enhertu . Next one scheduled on October 23rd. -Labs reviewed today: CMP: WNL, CBC: WNL, CA 15-3: Pending - Physical exam stable.  Proceed with Enhertu  treatment today.  Return to clinic in 6 weeks for follow-up.  Chronic pain due to bone metastases Chronic pain in lower back and ribs due to bone metastases She prefers to avoid frequent use of pain medication.  - Continue Flexeril  and hydrocodone  as needed for pain management. - Monitor pain levels and adjust treatment as necessary.  Rash, likely drug-related Rash with itching and erythematous rash, likely related to Enhertu . Managed with hydrocortisone and Benadryl  creams. Rash is improving.  - Continue hydrocortisone and Benadryl  creams for rash management.  Chemotherapy-induced peripheral neuropathy Peripheral neuropathy present since initial chemotherapy, managed with Lyrica . No worsening of symptoms reported.  -  Continue Lyrica  for neuropathy management.  Iron deficiency Iron deficiency with saturation ratio less than 20. Iron levels are low but within range.  - Hold off on starting iron supplements as patient is worried about constipation - Monitor iron levels and reassess need for supplementation.  Thrombocytosis Likely secondary to iron deficiency.  Resolved at this time.   The patient understands the plans discussed today and is in agreement with them.  She knows to contact our office if she develops concerns prior to her next appointment.  40 minutes of total time was spent for this patient encounter, including preparation, review of records , face-to-face counseling with the patient and coordination of care, physical exam, and documentation of the encounter.   Mickiel Dry, MD   [facility] [place] - A DEPT OF [facility] [address] [place] [address] Dept: [phone] Dept Fax: [phone]   No orders of the defined types were placed in this encounter.    ONCOLOGY HISTORY:   I have reviewed her chart and materials related to her cancer extensively and collaborated history with the patient. 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 Baseball size lump palpated in left breast (from documentation) -04/09.2020: Mammogram: Abnormal density in the left breast measuring 94 x 60 x 60 mm. A number of concerning lymph nodes were noted also. Skin was thickened. An ultrasound showed a 14 x 10 x 16 mm irregular mass and a number of  concerning lymph nodes with cortical thickening. (from documentation) -04/14/2019: Left breast biopsy 2:00: Invasive carcinoma (ductal type, with lobular features), grade 2, total score: 6, ER  positive, PR positive, HER2 negative [facility]) - 04/28/2019: [facility] germline testing: Negative -05/05/2019-09/16/2019: 4 cycles of Cyclophosphamide and dd epirubicin followed by 12 cycles of weekly paclitaxel  (from documentation) -10/02/2019: Bilateral mastectomies. Pathology:  -Right breast was benign.  Left breast showed residual multicentric invasive ductal cancer, grade 2.  Largest residual tumor mass was at least 8 cm.  Fibrosis was noted, consistent with chemotherapy effect.  Lymphovascular invasion identified.  Negative surgical margins.  3 of 10 lymph nodes showed metastases as well as 1 of 2 sentinel lymph nodes. -Sentinel node #1 had a 6 mm metastasis with extracapsular extension.  Sentinel node 2 was negative.  1 additional node with extracapsular extension.  Another node had fibrosis consistent with chemotherapy effect. -At least 5 foci of cancer were described.  Nuclear pleomorphism score 2, mitotic rate score 2, glandular differentiation score 2.  ER positive, strong intensity staining, 75% of cells and PR positive, strong intensity staining, 95% of cells.  HER-2/neu 2+ by IHC so equivocal.  Also equivocal by FISH.  HER-2 signals 5.5 and CEN 17 signals 3.7 so ratio 1.5.(from documentation, not available under media) -12/09/2019- 11/2020: Anastrazole 1mg  daily. Discontinued on progression -02/03/2020-Current: Monthly Denosumab  -02/29/2020-04/12/2020: RT to regional lymph nodes and chest wall -11/08/2020: CT chest: New sclerotic lesions noted mid thoracic spine. Previously noted left hilar lymph nodes have resolved. No new lymphadenopathy.  -11/24/2020: PET: Bilateral mastectomies with low-grade mildly heterogeneous tracer uptake in the left anterior chest wall reflecting post radiation/treatment changes.  Widespread osseous metastasis in T7, T8, T9, T12, L2, L3, L4, L5, midsternum, right acetabulum, right femoral neck, right proximal femur, left acetabulum, right  anterior fifth rib. The most intense uptake is seen in the right L4 vertebral body with 10.4 SUV. No pathologic fracture is appreciated at this time.  -12/05/2020- 07/23/2023: Palbociclib  + Fulvestrant . Ibrance  was dose reduced to 100 mg 3 weeks on/1 week off on 10/11/2021 due to fatigue.  Discontinued due to progression  -04/13/2021: PET: Widespread hypermetabolic osseous metastatic disease on prior exam shows a marked interval metabolic improvement. On today's exam no metabolic activity is demonstrated above normal osseous background. Some of the sclerotic lesions do show some progression with regard to size and density.  -07/06/2021: CA 15-3:34, CA 27.29:48.7 -08/10/2021: PET: No evidence of hypermetabolic metastatic disease. Widespread scattered sclerotic bone metastases in the spine and pelvis are stable and not metabolically active. No lymphadenopathy.  -03/21/2022: CT CAP: No evidence of lymphadenopathy or soft tissue metastatic disease in the chest, abdomen, or pelvis. Unchanged sclerotic osseous metastatic disease involving the vertebral bodies, bony pelvis, and right femoral neck. Status post left mastectomy. -03/21/2022: NM bone scan: Increased radiotracer uptake in the thoracolumbar spine and RIGHT anterolateral fifth rib consistent with known osseous metastatic disease -09/16/2022: NM bone scan: Right mid rib focus of increased activity again noted. Thoracic and lumbar spine focal areas of increased activity again noted. These findings are consistent with known metastatic disease. Similar findings noted on prior exam. A tiny new focus of increased activity in an upper lumbar vertebral body cannot be excluded. -10/04/2022: CT CAP: Unchanged sclerotic osseous metastatic disease involving the thoracic spine, lumbar spine, pelvis, and right femoral neck. No evidence of new or progressive metastatic disease in the chest, abdomen or pelvis. -04/02/2023: NM bone scan: Thoracolumbar and right rib  metastases, unchanged. -04/07/2023:  CA 15-3:35.4, CA 27.29:39 -04/07/2023: CT CAP: Unchanged sclerotic osseous metastases throughout the ribs, thoracic and lumbar vertebral bodies, pelvis, and right femoral neck. No evidence of new metastatic disease in the chest, abdomen, or pelvis. Status post bilateral mastectomy.  -07/17/2023:PET: Intense metabolic activity associated with sclerotic lesions in the T7 and T9 vertebral bodies consistent with active metastatic disease (breast cancer skeletal metastasis recurrence). No metabolic activity associated with sclerotic lesion in the T8 or L4 vertebral body. Mild activity associated with sclerotic lesion in the posterior RIGHT acetabulum. No soft tissue metastasis.  No lymphadenopathy -08/07/2023-10/20/2023: Ribociclib  + exemestane . Held for rash -10/30/2023: Stable exam. Persistent increased radiotracer uptake within the sclerotic T9 and T7 vertebral metastasis. No new or progressive sites of disease identified. -01/16/2024: Letrozole  2.5mg  daily. Discontinued after 1 dose due to irritation of the throat and tongue.  -02/03/2024-06/16/2024:Elacestrant  258 mg daily. Discontinued due to progression  -06/03/2024: CA 15-3:45.7, CA 27.29:67.3 -06/10/2024: EZU:Emnhmzddpnw of skeletal metastasis with multiple new hypermetabolic lesions in the pelvis, spine and there are approximately 20 skeletal lesions now. Previously 2 distinct thoracic spine lesions.Stable lesions in the thoracic spine. No evidence of visceral metastasis. -06/30/2024: ESR1 D538G. TMB: 13.29mut/Mb, MSI-H: not detected, HRD: Negative. -06/30/2024: ECHO: LVEF:55-60% -07/07/2024-Current: Enhertu  5.4mg /kg every 21 days -08/18/2024: CA 15-3:36, CA 27.29:NA  -09/08/2024: CA 27.29: 50.3 - 09/23/2024: PET scan: Marked interval improvement in skeletal metastasis. Majority of the skeletal metastasis have resolved completely. Two hypermetabolic lesions remain in the RIGHT sacrum and RIGHT pubic ramus. No  evidence of soft tissue metastasis.   Current Treatment:  Enhertu  5.4mg /kg every 21 days   INTERVAL HISTORY:    Discussed the use of AI scribe software for clinical note transcription with the patient, who gave verbal consent to proceed.  History of Present Illness Wendy Zamora is a 63 year old female with metastatic breast cancer who presents for follow-up and evaluation of treatment response.  She has a history of breast cancer with metastasis to the bones and is currently undergoing treatment with Enhertu .   She experiences a rash with red spots following her last treatment. The rash is itchy, and she uses hydrocortisone and Benadryl  creams for relief. The rash has improved but was initially very red and itchy. Additionally, she had a small spot over her eyebrow that resolved on its own.  She reports muscle pain from her back down to her calf, described as a constant ache. The pain sometimes worsens, and she uses Flexeril  and hydrocodone  for severe pain, although she tries to avoid taking them frequently. She also experiences numbness and tingling since her first round of chemotherapy, for which she takes Lyrica .  She has a history of shortness of breath and occasional cough since her initial chemotherapy treatment. No new shortness of breath or cough. She also mentions a tender spot at the bottom of her sternum that is sometimes sore.   I have reviewed the past medical history, past surgical history, social history and family history with the patient and they are unchanged from previous note.  ALLERGIES:  is allergic to augmentin [amoxicillin-pot clavulanate], ciprofloxacin, claritin [loratadine], gabapentin, keflex [cephalexin], levofloxacin, lisinopril, lisinopril, metoprolol, metoprolol, nexium [esomeprazole], sulfa antibiotics, tetracyclines & related, iohexol , ultram  [tramadol ], anastrozole , ceftin [cefuroxime axetil], chromium, exemestane , iodinated contrast media, kisqali  (200  mg dose) [ribociclib  succ (200 mg dose)], misc. sulfonamide containing compounds, oxycodone, sulfa antibiotics, latex, and nickel.  MEDICATIONS:  Current Outpatient Medications  Medication Sig Dispense Refill   amLODipine  (NORVASC ) 5 MG tablet Take 1 tablet  by mouth once daily 90 tablet 0   Ascorbic Acid (VITAMIN C) 1000 MG tablet Take 1,000 mg by mouth daily.     aspirin  81 MG EC tablet Take 81 mg by mouth daily.     azithromycin (ZITHROMAX) 250 MG tablet Take 2 tabs on day 1, followed by 1 tab daily until complete. 6 each 0   betamethasone  dipropionate 0.05 % cream Apply topically 2 (two) times daily. 30 g 1   Calcium-Magnesium-Vitamin D  (CALCIUM 1200+D3 PO) Take 1 tablet by mouth daily.     cetirizine -pseudoephedrine (ZYRTEC -D) 5-120 MG tablet Take 1 tablet by mouth daily.     CINNAMON PO Take 1,000 mg by mouth daily.     cyanocobalamin  1000 MCG tablet Take 1 tablet (1,000 mcg total) by mouth daily. (Patient taking differently: Take 1,000 mcg by mouth every other day.) 30 tablet 6   cyclobenzaprine  (FLEXERIL ) 10 MG tablet Take 1 tablet (10 mg total) by mouth 2 (two) times daily as needed for muscle spasms. 60 tablet 5   denosumab  (XGEVA ) 120 MG/1.7ML SOLN injection Inject 120 mg into the skin every 30 (thirty) days.     furosemide  (LASIX ) 20 MG tablet Take 1 tablet (20 mg total) by mouth daily as needed for edema (Take daily as needed for edema). 30 tablet 2   HYDROcodone -acetaminophen  (NORCO/VICODIN) 5-325 MG tablet Take 1 tablet by mouth every 8 (eight) hours as needed for moderate pain (pain score 4-6). 90 tablet 0   lansoprazole  (PREVACID ) 30 MG capsule Take 1 capsule (30 mg total) by mouth 2 (two) times daily. TAKE 1 CAPSULE BY MOUTH IN THE MORNING AND AT BEDTIME 180 capsule 3   lidocaine -prilocaine  (EMLA ) cream Apply 1 Application topically as needed (Apply to port site prior to port access). 30 g 6   linaclotide  (LINZESS ) 72 MCG capsule Take 1 capsule (72 mcg total) by mouth daily  before breakfast. 90 capsule 3   Misc. Devices MISC Please provide patient with left arm compression sleeve. Diagnosis metastatic breast cancer, bilateral mastectomy, left arm lymphedema 1 each 6   Misc. Devices MISC Please provide with prosthetic mastectomy bra 1 each 6   NAPHCON-A 0.025-0.3 % ophthalmic solution Place 2 drops into both eyes in the morning and at bedtime.     pregabalin  (LYRICA ) 25 MG capsule Take 1 capsule (25 mg total) by mouth 2 (two) times daily. As needed. 60 capsule 3   prochlorperazine  (COMPAZINE ) 10 MG tablet Take 1 tablet (10 mg total) by mouth every 6 (six) hours as needed for nausea or vomiting. 60 tablet 2   Triamcinolone  Acetonide (TRIAMCINOLONE  0.1 % CREAM : EUCERIN) CREA Apply 1 Application topically 3 (three) times daily as needed. 1 each 0   triamcinolone  cream (KENALOG ) 0.1 % Apply 1 Application topically 2 (two) times daily. 30 g 0   No current facility-administered medications for this visit.    REVIEW OF SYSTEMS:   Constitutional: Denies fevers, chills or abnormal weight loss Eyes: Denies blurriness of vision Ears, nose, mouth, throat, and face: Denies mucositis or sore throat Respiratory: Denies cough, dyspnea or wheezes Cardiovascular: Denies palpitation, chest discomfort or lower extremity swelling Gastrointestinal:  Denies nausea, heartburn or change in bowel habits Skin: Denies abnormal skin rashes Lymphatics: Denies new lymphadenopathy or easy bruising Neurological:Denies numbness, tingling or new weaknesses Behavioral/Psych: Mood is stable, no new changes  All other systems were reviewed with the patient and are negative.   VITALS:  Last menstrual period 06/08/2012.  Wt Readings from Last 3  Encounters:  10/20/24 174 lb 6.4 oz (79.1 kg)  09/29/24 175 lb 12.8 oz (79.7 kg)  09/08/24 177 lb 0.5 oz (80.3 kg)    There is no height or weight on file to calculate BMI.  Performance status (ECOG): 1 - Symptomatic but completely  ambulatory  PHYSICAL EXAM:   GENERAL:alert, no distress and comfortable SKIN: skin color, texture, turgor are normal, no rashes or significant lesions BREAST: Bilateral mastectomy. No masses palpated. No lymphadenopathy palpated LYMPH:  no palpable lymphadenopathy in the cervical, axillary or inguinal LUNGS: clear to auscultation and percussion with normal breathing effort HEART: regular rate & rhythm and no murmurs and no lower extremity edema ABDOMEN:abdomen soft, non-tender and normal bowel sounds Musculoskeletal:no cyanosis of digits and no clubbing  NEURO: alert & oriented x 3 with fluent speech, no focal motor/sensory deficits  LABORATORY DATA:  I have reviewed the data as listed    Component Value Date/Time   NA 142 10/20/2024 1054   K 3.7 10/20/2024 1054   CL 107 10/20/2024 1054   CO2 24 10/20/2024 1054   GLUCOSE 101 (H) 10/20/2024 1054   BUN 12 10/20/2024 1054   CREATININE 0.70 10/20/2024 1054   CALCIUM 9.4 10/20/2024 1054   PROT 6.7 10/20/2024 1054   ALBUMIN 4.0 10/20/2024 1054   AST 36 10/20/2024 1054   ALT 40 10/20/2024 1054   ALKPHOS 72 10/20/2024 1054   BILITOT 0.3 10/20/2024 1054   GFRNONAA >60 10/20/2024 1054   GFRAA >90 06/08/2012 1251    Lab Results  Component Value Date   WBC 4.2 10/20/2024   NEUTROABS 2.3 10/20/2024   HGB 13.5 10/20/2024   HCT 39.8 10/20/2024   MCV 95.0 10/20/2024   PLT 367 10/20/2024      Chemistry      Component Value Date/Time   NA 142 10/20/2024 1054   K 3.7 10/20/2024 1054   CL 107 10/20/2024 1054   CO2 24 10/20/2024 1054   BUN 12 10/20/2024 1054   CREATININE 0.70 10/20/2024 1054      Component Value Date/Time   CALCIUM 9.4 10/20/2024 1054   ALKPHOS 72 10/20/2024 1054   AST 36 10/20/2024 1054   ALT 40 10/20/2024 1054   BILITOT 0.3 10/20/2024 1054      Latest Reference Range & Units 08/18/24 09:08  Iron 28 - 170 ug/dL 76  UIBC ug/dL 682  TIBC 749 - 549 ug/dL 606  Saturation Ratios 10.4 - 31.8 % 19  Ferritin  11 - 307 ng/mL 37  Folate >5.9 ng/mL 17.0  Vitamin B12 180 - 914 pg/mL 654    Latest Reference Range & Units 08/18/24 09:08  CA 15-3 0.0 - 25.0 U/mL 36.0 (H)  (H): Data is abnormally high  RADIOGRAPHIC STUDIES: I have personally reviewed the radiological report as listed below  ECHOCARDIOGRAM COMPLETE    ECHOCARDIOGRAM REPORT       Patient Name:   HAYDN CUSH Date of Exam: 10/21/2024 Medical Rec #:  [id number]       Height:       64.5 in Accession #:    [id number]      Weight:       174.4 lb Date of Birth:  1961/03/21       BSA:          1.856 m Patient Age:    63 years        BP:           137/84 mmHg Patient Gender: F  HR:           88 bpm. Exam Location:  [facility]  Procedure: 2D Echo, Cardiac Doppler and Color Doppler (Both Spectral and Color            Flow Doppler were utilized during procedure).  Indications:    Chemo Z09   History:        Patient has prior history of Echocardiogram examinations, most                 recent 06/30/2024. Risk Factors:Hypertension and Dyslipidemia.                 Current every day smoker per H&P. Primary malignant neoplasm of                 breast with metastasis (HCC) with Hx of bilateral mastectomy.   Sonographer:    Aida Pizza RCS Referring Phys: [id number] Gabryella Murfin  IMPRESSIONS   1. Left ventricular ejection fraction, by estimation, is 65 to 70%. The left ventricle has normal function. The left ventricle has no regional wall motion abnormalities. Left ventricular diastolic parameters are consistent with Grade I diastolic  dysfunction (impaired relaxation).  2. Right ventricular systolic function is normal. The right ventricular size is normal. Tricuspid regurgitation signal is inadequate for assessing PA pressure.  3. The mitral valve is normal in structure. No evidence of mitral valve regurgitation. No evidence of mitral stenosis.  4. The aortic valve is tricuspid. Aortic valve regurgitation is not  visualized. No aortic stenosis is present.  5. The inferior vena cava is normal in size with greater than 50% respiratory variability, suggesting right atrial pressure of 3 mmHg.  Comparison(s): No significant change from prior study.  FINDINGS  Left Ventricle: Left ventricular ejection fraction, by estimation, is 65 to 70%. The left ventricle has normal function. The left ventricle has no regional wall motion abnormalities. Strain was performed and the global longitudinal strain is  indeterminate. The left ventricular internal cavity size was normal in size. There is no left ventricular hypertrophy. Left ventricular diastolic parameters are consistent with Grade I diastolic dysfunction (impaired relaxation).  Right Ventricle: The right ventricular size is normal. No increase in right ventricular wall thickness. Right ventricular systolic function is normal. Tricuspid regurgitation signal is inadequate for assessing PA pressure.  Left Atrium: Left atrial size was normal in size.  Right Atrium: Right atrial size was normal in size.  Pericardium: There is no evidence of pericardial effusion.  Mitral Valve: The mitral valve is normal in structure. No evidence of mitral valve regurgitation. No evidence of mitral valve stenosis.  Tricuspid Valve: The tricuspid valve is normal in structure. Tricuspid valve regurgitation is not demonstrated. No evidence of tricuspid stenosis.  Aortic Valve: The aortic valve is tricuspid. Aortic valve regurgitation is not visualized. No aortic stenosis is present.  Pulmonic Valve: The pulmonic valve was not well visualized. Pulmonic valve regurgitation is trivial. No evidence of pulmonic stenosis.  Aorta: The aortic root is normal in size and structure.  Venous: The inferior vena cava is normal in size with greater than 50% respiratory variability, suggesting right atrial pressure of 3 mmHg.  IAS/Shunts: No atrial level shunt detected by color flow  Doppler.  Additional Comments: 3D was performed not requiring image post processing on an independent workstation and was indeterminate.    LEFT VENTRICLE PLAX 2D LVIDd:         4.10 cm   Diastology LVIDs:  2.50 cm   LV e' medial:    7.58 cm/s LV PW:         1.10 cm   LV E/e' medial:  12.5 LV IVS:        1.10 cm   LV e' lateral:   10.80 cm/s LVOT diam:     1.80 cm   LV E/e' lateral: 8.8 LV SV:         65 LV SV Index:   35 LVOT Area:     2.54 cm    RIGHT VENTRICLE RV S prime:     14.00 cm/s TAPSE (M-mode): 2.4 cm  LEFT ATRIUM             Index        RIGHT ATRIUM           Index LA diam:        2.50 cm 1.35 cm/m   RA Area:     12.00 cm LA Vol (A2C):   33.7 ml 18.16 ml/m  RA Volume:   27.40 ml  14.77 ml/m LA Vol (A4C):   30.2 ml 16.28 ml/m LA Biplane Vol: 32.8 ml 17.68 ml/m  AORTIC VALVE LVOT Vmax:   134.00 cm/s LVOT Vmean:  89.600 cm/s LVOT VTI:    0.257 m   AORTA Ao Root diam: 3.40 cm  MITRAL VALVE MV Area (PHT): 3.72 cm    SHUNTS MV Decel Time: 204 msec    Systemic VTI:  0.26 m MV E velocity: 94.80 cm/s  Systemic Diam: 1.80 cm MV A velocity: 80.90 cm/s MV E/A ratio:  1.17  Vishnu Priya Mallipeddi Electronically signed by Diannah Late Mallipeddi Signature Date/Time: 10/21/2024/3:18:43 PM      Final

## 2024-11-10 ENCOUNTER — Inpatient Hospital Stay

## 2024-11-10 ENCOUNTER — Inpatient Hospital Stay: Admitting: Oncology

## 2024-11-10 ENCOUNTER — Inpatient Hospital Stay: Attending: Hematology

## 2024-11-10 ENCOUNTER — Encounter: Payer: Self-pay | Admitting: Oncology

## 2024-11-10 VITALS — BP 109/66 | HR 79 | Temp 97.7°F | Resp 18

## 2024-11-10 DIAGNOSIS — E611 Iron deficiency: Secondary | ICD-10-CM | POA: Diagnosis not present

## 2024-11-10 DIAGNOSIS — F17210 Nicotine dependence, cigarettes, uncomplicated: Secondary | ICD-10-CM | POA: Diagnosis not present

## 2024-11-10 DIAGNOSIS — Z170 Estrogen receptor positive status [ER+]: Secondary | ICD-10-CM | POA: Insufficient documentation

## 2024-11-10 DIAGNOSIS — C50412 Malignant neoplasm of upper-outer quadrant of left female breast: Secondary | ICD-10-CM | POA: Diagnosis present

## 2024-11-10 DIAGNOSIS — Z5112 Encounter for antineoplastic immunotherapy: Secondary | ICD-10-CM | POA: Diagnosis present

## 2024-11-10 DIAGNOSIS — G893 Neoplasm related pain (acute) (chronic): Secondary | ICD-10-CM | POA: Insufficient documentation

## 2024-11-10 DIAGNOSIS — C50919 Malignant neoplasm of unspecified site of unspecified female breast: Secondary | ICD-10-CM

## 2024-11-10 DIAGNOSIS — Z1721 Progesterone receptor positive status: Secondary | ICD-10-CM | POA: Insufficient documentation

## 2024-11-10 DIAGNOSIS — Z1732 Human epidermal growth factor receptor 2 negative status: Secondary | ICD-10-CM | POA: Diagnosis not present

## 2024-11-10 DIAGNOSIS — D75839 Thrombocytosis, unspecified: Secondary | ICD-10-CM | POA: Diagnosis not present

## 2024-11-10 DIAGNOSIS — Z7982 Long term (current) use of aspirin: Secondary | ICD-10-CM | POA: Diagnosis not present

## 2024-11-10 DIAGNOSIS — G6289 Other specified polyneuropathies: Secondary | ICD-10-CM | POA: Diagnosis not present

## 2024-11-10 DIAGNOSIS — M810 Age-related osteoporosis without current pathological fracture: Secondary | ICD-10-CM

## 2024-11-10 DIAGNOSIS — R052 Subacute cough: Secondary | ICD-10-CM

## 2024-11-10 DIAGNOSIS — Z79899 Other long term (current) drug therapy: Secondary | ICD-10-CM | POA: Diagnosis not present

## 2024-11-10 DIAGNOSIS — Z923 Personal history of irradiation: Secondary | ICD-10-CM | POA: Diagnosis not present

## 2024-11-10 DIAGNOSIS — Z79811 Long term (current) use of aromatase inhibitors: Secondary | ICD-10-CM | POA: Diagnosis not present

## 2024-11-10 DIAGNOSIS — C773 Secondary and unspecified malignant neoplasm of axilla and upper limb lymph nodes: Secondary | ICD-10-CM | POA: Diagnosis not present

## 2024-11-10 DIAGNOSIS — R21 Rash and other nonspecific skin eruption: Secondary | ICD-10-CM | POA: Diagnosis not present

## 2024-11-10 DIAGNOSIS — Z9013 Acquired absence of bilateral breasts and nipples: Secondary | ICD-10-CM | POA: Diagnosis not present

## 2024-11-10 DIAGNOSIS — C7951 Secondary malignant neoplasm of bone: Secondary | ICD-10-CM | POA: Diagnosis not present

## 2024-11-10 LAB — CBC WITH DIFFERENTIAL/PLATELET
Abs Immature Granulocytes: 0.04 K/uL (ref 0.00–0.07)
Basophils Absolute: 0.1 K/uL (ref 0.0–0.1)
Basophils Relative: 1 %
Eosinophils Absolute: 0.2 K/uL (ref 0.0–0.5)
Eosinophils Relative: 3 %
HCT: 38.9 % (ref 36.0–46.0)
Hemoglobin: 13.1 g/dL (ref 12.0–15.0)
Immature Granulocytes: 1 %
Lymphocytes Relative: 16 %
Lymphs Abs: 1.1 K/uL (ref 0.7–4.0)
MCH: 32.4 pg (ref 26.0–34.0)
MCHC: 33.7 g/dL (ref 30.0–36.0)
MCV: 96.3 fL (ref 80.0–100.0)
Monocytes Absolute: 0.8 K/uL (ref 0.1–1.0)
Monocytes Relative: 12 %
Neutro Abs: 4.8 K/uL (ref 1.7–7.7)
Neutrophils Relative %: 67 %
Platelets: 454 K/uL — ABNORMAL HIGH (ref 150–400)
RBC: 4.04 MIL/uL (ref 3.87–5.11)
RDW: 15.6 % — ABNORMAL HIGH (ref 11.5–15.5)
WBC: 7.1 K/uL (ref 4.0–10.5)
nRBC: 0 % (ref 0.0–0.2)

## 2024-11-10 LAB — COMPREHENSIVE METABOLIC PANEL WITH GFR
ALT: 39 U/L (ref 0–44)
AST: 39 U/L (ref 15–41)
Albumin: 4.1 g/dL (ref 3.5–5.0)
Alkaline Phosphatase: 77 U/L (ref 38–126)
Anion gap: 11 (ref 5–15)
BUN: 15 mg/dL (ref 8–23)
CO2: 25 mmol/L (ref 22–32)
Calcium: 9.5 mg/dL (ref 8.9–10.3)
Chloride: 106 mmol/L (ref 98–111)
Creatinine, Ser: 0.68 mg/dL (ref 0.44–1.00)
GFR, Estimated: >60 mL/min (ref >60)
Glucose, Bld: 86 mg/dL (ref 70–99)
Potassium: 4 mmol/L (ref 3.5–5.1)
Sodium: 142 mmol/L (ref 135–145)
Total Bilirubin: 0.3 mg/dL (ref 0.0–1.2)
Total Protein: 6.9 g/dL (ref 6.5–8.1)

## 2024-11-10 LAB — MAGNESIUM: Magnesium: 2.2 mg/dL (ref 1.7–2.4)

## 2024-11-10 MED ORDER — DENOSUMAB 120 MG/1.7ML ~~LOC~~ SOLN
120.0000 mg | Freq: Once | SUBCUTANEOUS | Status: AC
  Administered 2024-11-10: 120 mg via SUBCUTANEOUS
  Filled 2024-11-10: qty 1.7

## 2024-11-10 MED ORDER — ACETAMINOPHEN 325 MG PO TABS
650.0000 mg | ORAL_TABLET | Freq: Once | ORAL | Status: AC
  Administered 2024-11-10: 650 mg via ORAL
  Filled 2024-11-10: qty 2

## 2024-11-10 MED ORDER — FAM-TRASTUZUMAB DERUXTECAN-NXKI CHEMO 100 MG IV SOLR
5.4000 mg/kg | Freq: Once | INTRAVENOUS | Status: AC
  Administered 2024-11-10: 400 mg via INTRAVENOUS
  Filled 2024-11-10: qty 20

## 2024-11-10 MED ORDER — SODIUM CHLORIDE 0.9 % IV SOLN
150.0000 mg | Freq: Once | INTRAVENOUS | Status: AC
  Administered 2024-11-10: 150 mg via INTRAVENOUS
  Filled 2024-11-10: qty 150

## 2024-11-10 MED ORDER — DEXAMETHASONE SOD PHOSPHATE PF 10 MG/ML IJ SOLN
10.0000 mg | Freq: Once | INTRAMUSCULAR | Status: AC
  Administered 2024-11-10: 10 mg via INTRAVENOUS

## 2024-11-10 MED ORDER — PALONOSETRON HCL INJECTION 0.25 MG/5ML
0.2500 mg | Freq: Once | INTRAVENOUS | Status: AC
  Administered 2024-11-10: 0.25 mg via INTRAVENOUS
  Filled 2024-11-10: qty 5

## 2024-11-10 MED ORDER — DEXTROSE 5 % IV SOLN: INTRAVENOUS | Status: DC

## 2024-11-10 MED ORDER — CETIRIZINE HCL 10 MG/ML IV SOLN
10.0000 mg | Freq: Once | INTRAVENOUS | Status: AC
  Administered 2024-11-10: 10 mg via INTRAVENOUS
  Filled 2024-11-10: qty 1

## 2024-11-10 NOTE — Patient Instructions (Signed)
 [facility] [place] - A DEPT OF [facility]  Discharge Instructions: Thank you for choosing [facility] to provide your oncology and hematology care.  If you have a lab appointment with the Cancer Center - please note that after April 8th, 2024, all labs will be drawn in the cancer center.  You do not have to check in or register with the main entrance as you have in the past but will complete your check-in in the cancer center.  Wear comfortable clothing and clothing appropriate for easy access to any Portacath or PICC line.   We strive to give you quality time with your provider. You may need to reschedule your appointment if you arrive late (15 or more minutes).  Arriving late affects you and other patients whose appointments are after yours.  Also, if you miss three or more appointments without notifying the office, you may be dismissed from the clinic at the provider's discretion.      For prescription refill requests, have your pharmacy contact our office and allow 72 hours for refills to be completed.    Today you received the following chemotherapy and/or immunotherapy agents Enhertu   To help prevent nausea and vomiting after your treatment, we encourage you to take your nausea medication as directed.  BELOW ARE SYMPTOMS THAT SHOULD BE REPORTED IMMEDIATELY: *FEVER GREATER THAN 100.4 F (38 C) OR HIGHER *CHILLS OR SWEATING *NAUSEA AND VOMITING THAT IS NOT CONTROLLED WITH YOUR NAUSEA MEDICATION *UNUSUAL SHORTNESS OF BREATH *UNUSUAL BRUISING OR BLEEDING *URINARY PROBLEMS (pain or burning when urinating, or frequent urination) *BOWEL PROBLEMS (unusual diarrhea, constipation, pain near the anus) TENDERNESS IN MOUTH AND THROAT WITH OR WITHOUT PRESENCE OF ULCERS (sore throat, sores in mouth, or a toothache) UNUSUAL RASH, SWELLING OR PAIN  UNUSUAL VAGINAL DISCHARGE OR ITCHING   Items with * indicate a potential emergency and should be followed up as  soon as possible or go to the Emergency Department if any problems should occur.  Please show the CHEMOTHERAPY ALERT CARD or IMMUNOTHERAPY ALERT CARD at check-in to the Emergency Department and triage nurse.  Should you have questions after your visit or need to cancel or reschedule your appointment, please contact [facility] [place] - A DEPT OF [facility] [phone]  and follow the prompts.  Office hours are 8:00 a.m. to 4:30 p.m. Monday - Friday. Please note that voicemails left after 4:00 p.m. may not be returned until the following business day.  We are closed weekends and major holidays. You have access to a nurse at all times for urgent questions. Please call the main number to the clinic [phone] and follow the prompts.  For any non-urgent questions, you may also contact your provider using MyChart. We now offer e-Visits for anyone 41 and older to request care online for non-urgent symptoms. For details visit [email or web address].   Also download the MyChart app! Go to the app store, search "MyChart", open the app, select , and log in with your MyChart username and password.

## 2024-11-10 NOTE — Patient Instructions (Signed)

## 2024-11-10 NOTE — Progress Notes (Signed)
 Patient tolerated chemotherapy with no complaints voiced.  Side effects with management reviewed with understanding verbalized.  Port site clean and dry with no bruising or swelling noted at site.  Good blood return noted before and after administration of chemotherapy.  Band aid applied.  Patient left in satisfactory condition with VSS and no s/s of distress noted. All follow ups as scheduled.   Venkat Ankney [employer]

## 2024-11-11 ENCOUNTER — Encounter: Payer: Self-pay | Admitting: Oncology

## 2024-11-11 ENCOUNTER — Other Ambulatory Visit: Payer: Self-pay

## 2024-11-11 LAB — CANCER ANTIGEN 27.29: CA 27.29: 43.5 U/mL — ABNORMAL HIGH (ref 0.0–38.6)

## 2024-11-11 LAB — CANCER ANTIGEN 15-3: CA 15-3: 33.2 U/mL — ABNORMAL HIGH (ref 0.0–25.0)

## 2024-11-15 ENCOUNTER — Encounter (INDEPENDENT_AMBULATORY_CARE_PROVIDER_SITE_OTHER): Payer: Self-pay

## 2024-11-15 ENCOUNTER — Ambulatory Visit (INDEPENDENT_AMBULATORY_CARE_PROVIDER_SITE_OTHER): Admitting: Gastroenterology

## 2024-11-16 ENCOUNTER — Ambulatory Visit (HOSPITAL_COMMUNITY)
Admission: RE | Admit: 2024-11-16 | Discharge: 2024-11-16 | Disposition: A | Discharge status: Home / Self Care | Source: Ambulatory Visit | Attending: Oncology | Admitting: Oncology

## 2024-11-16 ENCOUNTER — Other Ambulatory Visit: Payer: Self-pay | Admitting: *Deleted

## 2024-11-16 ENCOUNTER — Telehealth: Payer: Self-pay | Admitting: *Deleted

## 2024-11-16 DIAGNOSIS — R058 Other specified cough: Secondary | ICD-10-CM | POA: Diagnosis not present

## 2024-11-16 NOTE — Telephone Encounter (Addendum)
 Patient called to inform that she is still having chest congestion and coughing up phlegm. Finished Zithromax that was prescribed on 11/4. Using Robitussin and Muinex without effect. Denies fever. Had treatment last week. Per Dr. Davonna, will come to hospital for Chest X ray today.  CXR did not show active disease.  Per Dr. Davonna, continue current regimen and add Flonase.  Currently taking zyrtec , as she is allergic to Claritin.  Will call if symptoms worsen, fever develops or has not improved in 2 weeks.

## 2024-12-01 ENCOUNTER — Inpatient Hospital Stay

## 2024-12-01 ENCOUNTER — Inpatient Hospital Stay: Attending: Hematology

## 2024-12-01 ENCOUNTER — Encounter: Payer: Self-pay | Admitting: Oncology

## 2024-12-01 VITALS — BP 121/70 | HR 75 | Temp 96.0°F | Resp 18

## 2024-12-01 DIAGNOSIS — Z7951 Long term (current) use of inhaled steroids: Secondary | ICD-10-CM | POA: Diagnosis not present

## 2024-12-01 DIAGNOSIS — C7951 Secondary malignant neoplasm of bone: Secondary | ICD-10-CM | POA: Diagnosis present

## 2024-12-01 DIAGNOSIS — C50919 Malignant neoplasm of unspecified site of unspecified female breast: Secondary | ICD-10-CM

## 2024-12-01 DIAGNOSIS — Z170 Estrogen receptor positive status [ER+]: Secondary | ICD-10-CM | POA: Diagnosis not present

## 2024-12-01 DIAGNOSIS — Z1721 Progesterone receptor positive status: Secondary | ICD-10-CM | POA: Diagnosis not present

## 2024-12-01 DIAGNOSIS — Z1732 Human epidermal growth factor receptor 2 negative status: Secondary | ICD-10-CM | POA: Diagnosis not present

## 2024-12-01 DIAGNOSIS — C50412 Malignant neoplasm of upper-outer quadrant of left female breast: Secondary | ICD-10-CM | POA: Insufficient documentation

## 2024-12-01 DIAGNOSIS — Z5112 Encounter for antineoplastic immunotherapy: Secondary | ICD-10-CM | POA: Insufficient documentation

## 2024-12-01 LAB — CBC WITH DIFFERENTIAL/PLATELET
Abs Immature Granulocytes: 0.02 K/uL (ref 0.00–0.07)
Basophils Absolute: 0 K/uL (ref 0.0–0.1)
Basophils Relative: 1 %
Eosinophils Absolute: 0.1 K/uL (ref 0.0–0.5)
Eosinophils Relative: 3 %
HCT: 40.3 % (ref 36.0–46.0)
Hemoglobin: 13.7 g/dL (ref 12.0–15.0)
Immature Granulocytes: 1 %
Lymphocytes Relative: 34 %
Lymphs Abs: 1.2 K/uL (ref 0.7–4.0)
MCH: 33.2 pg (ref 26.0–34.0)
MCHC: 34 g/dL (ref 30.0–36.0)
MCV: 97.6 fL (ref 80.0–100.0)
Monocytes Absolute: 0.5 K/uL (ref 0.1–1.0)
Monocytes Relative: 13 %
Neutro Abs: 1.8 K/uL (ref 1.7–7.7)
Neutrophils Relative %: 48 %
Platelets: 345 K/uL (ref 150–400)
RBC: 4.13 MIL/uL (ref 3.87–5.11)
RDW: 15.9 % — ABNORMAL HIGH (ref 11.5–15.5)
WBC: 3.6 K/uL — ABNORMAL LOW (ref 4.0–10.5)
nRBC: 0 % (ref 0.0–0.2)

## 2024-12-01 LAB — COMPREHENSIVE METABOLIC PANEL WITH GFR
ALT: 37 U/L (ref 0–44)
AST: 44 U/L — ABNORMAL HIGH (ref 15–41)
Albumin: 4.3 g/dL (ref 3.5–5.0)
Alkaline Phosphatase: 78 U/L (ref 38–126)
Anion gap: 12 (ref 5–15)
BUN: 13 mg/dL (ref 8–23)
CO2: 21 mmol/L — ABNORMAL LOW (ref 22–32)
Calcium: 9.4 mg/dL (ref 8.9–10.3)
Chloride: 108 mmol/L (ref 98–111)
Creatinine, Ser: 0.84 mg/dL (ref 0.44–1.00)
GFR, Estimated: >60 mL/min (ref >60)
Glucose, Bld: 93 mg/dL (ref 70–99)
Potassium: 4.1 mmol/L (ref 3.5–5.1)
Sodium: 141 mmol/L (ref 135–145)
Total Bilirubin: 0.2 mg/dL (ref 0.0–1.2)
Total Protein: 6.8 g/dL (ref 6.5–8.1)

## 2024-12-01 LAB — MAGNESIUM: Magnesium: 2.2 mg/dL (ref 1.7–2.4)

## 2024-12-01 MED ORDER — PALONOSETRON HCL INJECTION 0.25 MG/5ML
0.2500 mg | Freq: Once | INTRAVENOUS | Status: AC
  Administered 2024-12-01: 0.25 mg via INTRAVENOUS
  Filled 2024-12-01: qty 5

## 2024-12-01 MED ORDER — DEXTROSE 5 % IV SOLN: INTRAVENOUS | Status: DC

## 2024-12-01 MED ORDER — FAM-TRASTUZUMAB DERUXTECAN-NXKI CHEMO 100 MG IV SOLR
5.4000 mg/kg | Freq: Once | INTRAVENOUS | Status: AC
  Administered 2024-12-01: 400 mg via INTRAVENOUS
  Filled 2024-12-01: qty 20

## 2024-12-01 MED ORDER — ACETAMINOPHEN 325 MG PO TABS
650.0000 mg | ORAL_TABLET | Freq: Once | ORAL | Status: AC
  Administered 2024-12-01: 650 mg via ORAL
  Filled 2024-12-01: qty 2

## 2024-12-01 MED ORDER — SODIUM CHLORIDE 0.9 % IV SOLN
150.0000 mg | Freq: Once | INTRAVENOUS | Status: AC
  Administered 2024-12-01: 150 mg via INTRAVENOUS
  Filled 2024-12-01: qty 150

## 2024-12-01 MED ORDER — DEXAMETHASONE SOD PHOSPHATE PF 10 MG/ML IJ SOLN
10.0000 mg | Freq: Once | INTRAMUSCULAR | Status: AC
  Administered 2024-12-01: 10 mg via INTRAVENOUS

## 2024-12-01 MED ORDER — CETIRIZINE HCL 10 MG/ML IV SOLN
10.0000 mg | Freq: Once | INTRAVENOUS | Status: AC
  Administered 2024-12-01: 10 mg via INTRAVENOUS
  Filled 2024-12-01: qty 1

## 2024-12-01 NOTE — Progress Notes (Signed)
Treatment given per orders. Patient tolerated it well without problems. Vitals stable and discharged home from clinic ambulatory. Follow up as scheduled.  

## 2024-12-02 LAB — CANCER ANTIGEN 15-3: CA 15-3: 34.2 U/mL — ABNORMAL HIGH (ref 0.0–25.0)

## 2024-12-02 LAB — CANCER ANTIGEN 27.29: CA 27.29: 54.4 U/mL — ABNORMAL HIGH (ref 0.0–38.6)

## 2024-12-03 ENCOUNTER — Other Ambulatory Visit: Payer: Self-pay

## 2024-12-15 ENCOUNTER — Other Ambulatory Visit: Payer: Self-pay | Admitting: Oncology

## 2024-12-15 DIAGNOSIS — C50919 Malignant neoplasm of unspecified site of unspecified female breast: Secondary | ICD-10-CM

## 2024-12-20 ENCOUNTER — Ambulatory Visit (INDEPENDENT_AMBULATORY_CARE_PROVIDER_SITE_OTHER): Admitting: Gastroenterology

## 2024-12-20 ENCOUNTER — Encounter (INDEPENDENT_AMBULATORY_CARE_PROVIDER_SITE_OTHER): Payer: Self-pay | Admitting: Gastroenterology

## 2024-12-20 VITALS — BP 127/70 | HR 86 | Temp 97.8°F | Ht 64.0 in | Wt 170.2 lb

## 2024-12-20 DIAGNOSIS — R1310 Dysphagia, unspecified: Secondary | ICD-10-CM

## 2024-12-20 DIAGNOSIS — K219 Gastro-esophageal reflux disease without esophagitis: Secondary | ICD-10-CM

## 2024-12-20 NOTE — Progress Notes (Signed)
 "  Referring Provider: Bevely Doffing, FNP Primary Care Physician:  Bevely Doffing, FNP Primary GI Physician: Previously Dr. Eartha (Dr. Cinderella)   Chief Complaint  Patient presents with   Follow-up    Patient here today for a follow up on dysphagia. Patient says she is still having issues with this and feels she needs to have a EGD to have dilated. Patient currently taking lansoprazole  30 mg bid.     HPI:   Wendy Zamora is a 63 y.o. female with past medical history of  metastatic breast cancer s/p bilateral mastectomy, radiation therapy and currently about to restart chemotherapy   Patient presenting today for:  Follow up of GERD and dysphagia  Last seen July, at that time had recent port placed to start chemo due to bone mets. Having some dysphagia at times, taking prevacid  30mg  BID. Ddi not tolerate nexium in the past, famotidine did not work. Some occasional epigastric discomfort. Having occasional constipation, tried metamucil and miralax in the past without improvement   Recommended to continue prevacid  30mg  BID, pt to make me aware when ready to schedule EGD, repeat TCS November 2026, consider trial of different PPI  Last CBC normal other than WBC 3.6, CMP with normal LFTs other than AST 44, CA 15-3 34.2 CA 27.29 54.4 on 12/01/24  Present:  Has ongoing dysphagia, feeling food stuck in lower throat to upper to mid chest. She does have scar tissue in her chest which she sometimes can push on and helps food go down. She also notes some soreness of lower chest (xyphoid process area). She is having to watch what she eats and make sure she chews very well. Sometimes has dysphagia with liquids as well. Doing chemo every 3 weeks currently and generally has a few rough days after this but feels back to baseline at about the 5th day out.  She has a mix of acid regurgitation and heartburn a few times per week on prevacid  30mg  BID. She does feel that having esophagus stretched in the past helped  her dysphagia.    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  modified barium swallow that showed decreased muscle contraction but there was no official report about this.  Last EGD: 01/22/22 - No endoscopic esophageal abnormality to explain patient's dysphagia. Dilated. Biopsied. - 2 cm hiatal hernia. - Erosive gastropathy with no stigmata of recent bleeding. Biopsied. - Normal examined duodenum. Last Colonoscopy: 11/03/2020 cecal and rectal polyp between 3 mm to 1 cm in size removed with a hot snare. No pathology available (had been recommended to repeat in 5 years)    Path A. STOMACH, BIOPSY:  - Gastric antral mucosa with nonspecific reactive gastropathy  - Gastric oxyntic mucosa with parietal cell hyperplasia as can be seen  in hypergastrinemic states such as PPI therapy.  - Helicobacter pylori-like organisms are not identified on routine HE  stain  B. ESOPHAGUS, DISTAL, BIOPSY:  - Esophageal squamous mucosa with no specific histopathologic changes  - Negative for increased intraepithelial eosinophils C. ESOPHAGUS, MID, BIOPSY:  - Esophageal squamous mucosa with no specific histopathologic changes  - Negative for increased intraepithelial eosinophils     Filed Weights   12/20/24 1100  Weight: 170 lb 3.2 oz (77.2 kg)     Past Medical History:  Diagnosis Date  Allergy    Asthma    Asthma    diagnosed age 63   Bone metastasis 08/2020   was on prolia    Breast cancer (HCC)    started chemo 05/05/2019. finished 08/2019   Gangrene (HCC)    GERD (gastroesophageal reflux disease)    Hx of bilateral mastectomy 10/22/2019   Hypertension    Hypertension    started bp meds 2008   Lymphedema of arm    left arm   Osteopenia    Shingles  04/20/2020   left side    Past Surgical History:  Procedure Laterality Date   BIOPSY  01/22/2022   Procedure: BIOPSY;  Surgeon: Eartha Angelia Sieving, MD;  Location: [facility];  Service: Gastroenterology;;   ORIN MEDIATE RELEASE Bilateral 2006   ESOPHAGOGASTRODUODENOSCOPY (EGD) WITH PROPOFOL  N/A 01/22/2022   Procedure: ESOPHAGOGASTRODUODENOSCOPY (EGD) WITH PROPOFOL ;  Surgeon: Eartha Angelia Sieving, MD;  Location: [facility];  Service: Gastroenterology;  Laterality: N/A;  205   hysteroscopy     age 89   IR IMAGING GUIDED PORT INSERTION  06/17/2024   mastectomy Bilateral 10/22/2019   pt had necrosis after 1st surgery, had another surgery to remove implants 12/10/2019   MOUTH SURGERY     TUBAL LIGATION     TUBAL LIGATION Bilateral 1998   WISDOM TOOTH EXTRACTION     early 30's    Current Outpatient Medications  Medication Sig Dispense Refill   amLODipine  (NORVASC ) 5 MG tablet Take 1 tablet by mouth once daily 90 tablet 0   Ascorbic Acid (VITAMIN C) 1000 MG tablet Take 1,000 mg by mouth daily.     aspirin  81 MG EC tablet Take 81 mg by mouth daily.     Calcium-Magnesium-Vitamin D  (CALCIUM 1200+D3 PO) Take 1 tablet by mouth daily.     cetirizine  (ZYRTEC ) 10 MG tablet Take 10 mg by mouth daily.     CINNAMON PO Take 1,000 mg by mouth daily.     cyanocobalamin  1000 MCG tablet Take 1 tablet (1,000 mcg total) by mouth daily. (Patient taking differently: Take 1,000 mcg by mouth every other day.) 30 tablet 6   cyclobenzaprine  (FLEXERIL ) 10 MG tablet Take 1 tablet (10 mg total) by mouth 2 (two) times daily as needed for muscle spasms. 60 tablet 5   denosumab  (XGEVA ) 120 MG/1.7ML SOLN injection Inject 120 mg into the skin every 30 (thirty) days. (Patient taking differently: Inject 120 mg into the skin every 6 (six) weeks. e)     fluticasone (FLONASE) 50 MCG/ACT nasal spray Place 2 sprays into both nostrils daily. (Patient taking differently: Place 2 sprays into both nostrils as  needed.)     furosemide  (LASIX ) 20 MG tablet Take 1 tablet (20 mg total) by mouth daily as needed for edema (Take daily as needed for edema). 30 tablet 2   HYDROcodone -acetaminophen  (NORCO/VICODIN) 5-325 MG tablet Take 1 tablet by mouth every 8 (eight) hours as needed for moderate pain (pain score 4-6). 90 tablet 0   lansoprazole  (PREVACID ) 30 MG capsule Take 1 capsule (30 mg total) by mouth 2 (two) times daily. TAKE 1 CAPSULE BY MOUTH IN THE MORNING AND AT BEDTIME 180 capsule 3   lidocaine -prilocaine  (EMLA ) cream Apply 1 Application topically as needed (Apply to port site prior to port access). 30 g 6   linaclotide  (LINZESS ) 72 MCG capsule Take 1 capsule (72 mcg total) by mouth daily before breakfast. (Patient taking differently: Take 72 mcg by mouth. Three times per week.) 90 capsule 3  Misc. Devices MISC Please provide patient with left arm compression sleeve. Diagnosis metastatic breast cancer, bilateral mastectomy, left arm lymphedema 1 each 6   Misc. Devices MISC Please provide with prosthetic mastectomy bra 1 each 6   pregabalin  (LYRICA ) 25 MG capsule Take 1 capsule (25 mg total) by mouth 2 (two) times daily. As needed. 60 capsule 3   prochlorperazine  (COMPAZINE ) 10 MG tablet Take 1 tablet (10 mg total) by mouth every 6 (six) hours as needed for nausea or vomiting. 60 tablet 2   RESTASIS 0.05 % ophthalmic emulsion 1 drop 2 (two) times daily.     No current facility-administered medications for this visit.    Allergies as of 12/20/2024 - Review Complete 12/20/2024  Allergen Reaction Noted   Augmentin [amoxicillin-pot clavulanate] Anaphylaxis 07/03/2021   Ciprofloxacin Anaphylaxis 07/03/2021   Claritin [loratadine] Anaphylaxis 07/03/2021   Gabapentin Anaphylaxis 07/03/2021   Keflex [cephalexin] Anaphylaxis 07/03/2021   Levofloxacin Anaphylaxis 07/03/2021   Lisinopril Swelling 06/08/2012   Lisinopril Anaphylaxis 07/03/2021   Metoprolol Swelling 06/08/2012   Metoprolol Anaphylaxis  07/03/2021   Nexium [esomeprazole] Anaphylaxis 07/03/2021   Sulfa antibiotics Hives and Swelling 06/08/2012   Tetracyclines & related Anaphylaxis 07/03/2021   Iohexol  Hives 09/16/2022   Ultram  [tramadol ] Other (See Comments) 03/18/2023   Anastrozole  Hives 01/14/2024   Ceftin [cefuroxime axetil] Hives 06/08/2012   Chromium Other (See Comments) 07/03/2021   Exemestane  Hives 01/14/2024   Iodinated contrast media Hives 03/04/2023   Kisqali  (200 mg dose) [ribociclib  succ (200 mg dose)] Hives 01/14/2024   Misc. sulfonamide containing compounds Other (See Comments) 10/22/2022   Oxycodone Itching 07/03/2021   Sulfa antibiotics  04/03/2015   Latex Rash 07/03/2021   Nickel Rash 07/03/2021    Social History   Socioeconomic History   Marital status: Married    Spouse name: Not on file   Number of children: 2   Years of education: Not on file   Highest education level: GED or equivalent  Occupational History   Occupation: Disability  Tobacco Use   Smoking status: Every Day    Current packs/day: 0.50    Average packs/day: 0.5 packs/day for 40.0 years (20.0 ttl pk-yrs)    Types: Cigarettes   Smokeless tobacco: Never   Tobacco comments:    Smokes half a pack a day for 40 years.   Vaping Use   Vaping status: Never Used  Substance and Sexual Activity   Alcohol use: Never   Drug use: Never   Sexual activity: Yes  Other Topics Concern   Not on file  Social History Narrative   Lives with her husband.    Social Drivers of Health   Tobacco Use: High Risk (12/20/2024)   Patient History    Smoking Tobacco Use: Every Day    Smokeless Tobacco Use: Never    Passive Exposure: Not on file  Financial Resource Strain: Medium Risk (06/18/2024)   Overall Financial Resource Strain (CARDIA)    Difficulty of Paying Living Expenses: Somewhat hard  Food Insecurity: Food Insecurity Present (06/18/2024)   Epic    Worried About Programme Researcher, Broadcasting/film/video in the Last Year: Sometimes true    Ran Out of Food  in the Last Year: Not on file  Transportation Needs: No Transportation Needs (06/18/2024)   Epic    Lack of Transportation (Medical): No    Lack of Transportation (Non-Medical): No  Physical Activity: Unknown (03/19/2024)   Exercise Vital Sign    Days of Exercise per Week: Patient declined  Minutes of Exercise per Session: Not on file  Stress: Stress Concern Present (06/18/2024)   [employer] of Occupational Health - Occupational Stress Questionnaire    Feeling of Stress: To some extent  Social Connections: Unknown (06/18/2024)   Social Connection and Isolation Panel    Frequency of Communication with Friends and Family: Three times a week    Frequency of Social Gatherings with Friends and Family: Once a week    Attends Religious Services: Not on file    Active Member of Clubs or Organizations: No    Attends Banker Meetings: Not on file    Marital Status: Married  Depression (PHQ2-9): Low Risk (12/01/2024)   Depression (PHQ2-9)    PHQ-2 Score: 0  Alcohol Screen: Not on file  Housing: Unknown (06/18/2024)   Epic    Unable to Pay for Housing in the Last Year: Not on file    Number of Times Moved in the Last Year: Not on file    Homeless in the Last Year: No  Utilities: Not on file  Health Literacy: Not on file    Review of systems General: negative for malaise, night sweats, fever, chills, weight loss Neck: Negative for lumps, goiter, pain and significant neck swelling Resp: Negative for cough, wheezing, dyspnea at rest CV: Negative for chest pain, leg swelling, palpitations, orthopnea GI: denies melena, hematochezia, nausea, vomiting, diarrhea, constipation, odyonophagia, early satiety or unintentional weight loss. +dysphagia +GERD symptoms MSK: Negative for joint pain or swelling, back pain, and muscle pain. Derm: Negative for itching or rash Psych: Denies depression, anxiety, memory loss, confusion. No homicidal or suicidal ideation.  Heme: Negative for  prolonged bleeding, bruising easily, and swollen nodes. Endocrine: Negative for cold or heat intolerance, polyuria, polydipsia and goiter. Neuro: negative for tremor, gait imbalance, syncope and seizures. The remainder of the review of systems is noncontributory.  Physical Exam: BP 127/70 (BP Location: Right Arm, Patient Position: Sitting, Cuff Size: Large)   Pulse 86   Temp 97.8 F (36.6 C) (Temporal)   Ht 5' 4 (1.626 m)   Wt 170 lb 3.2 oz (77.2 kg)   LMP 06/08/2012   BMI 29.21 kg/m  General:   Alert and oriented. No distress noted. Pleasant and cooperative.  Head:  Normocephalic and atraumatic. Eyes:  Conjuctiva clear without scleral icterus. Mouth:  Oral mucosa pink and moist. Good dentition. No lesions. Heart: Normal rate and rhythm, s1 and s2 heart sounds present.  Lungs/chest: Clear lung sounds in all lobes. Respirations equal and unlabored. TTP of xyphoid process Abdomen:  +BS, soft, non-tender and non-distended. No rebound or guarding. No HSM or masses noted. Derm: No palmar erythema or jaundice Msk:  Symmetrical without gross deformities. Normal posture. Extremities:  Without edema. Neurologic:  Alert and  oriented x4 Psych:  Alert and cooperative. Normal mood and affect.  Invalid input(s): 6 MONTHS   ASSESSMENT: KATIYA FIKE is a 63 y.o. female presenting today for follow up of GERD and dysphagia  GERD: Still having breakthrough symptoms multiple times per week on prevacid  30mg  BID. She has declined change in PPI therapy at this time due to concern for any side effects and not responding well to other PPIs in the past. States pepcid did not provide any results in the past.  I did encourage her to be mindful of good reflux precautions. Will continue with prevacid  30mg  BID for now, if she wishes to trial another PPI, which I encouraged her to do, she will make me aware.  Dysphagia: Endoflip in November 2024 as outlined above with EGJOO.  Having ongoing issues with  dysphagia, foods sticking in mid throat/chest.   Recommended proceeding with upper endoscopy for further evaluation at last visit though due to other health issues she deferred at that time, she is agreeable to pursue EGD at this time. As above, given ongoing uncontrolled GERD, would recommend bravo placement at time of EGD as well. Indications, risks and benefits of procedure discussed in detail with patient. Patient verbalized understanding and is in agreement to proceed with EGD +/- dilation with BRAVO placement on PPI.   She reports some soreness and pain to area of xyphoid process with some TTP on exam today. History of radiation therapy to thoracic area around 2021 due to history of breast cancer, no lesions or abnormalities of this area noted on any recent imaging (PET scan/ chest xray), suspect this may be secondary to scar tissue in this area. Should follow with PCP/oncology regarding ongoing pain in this region.     PLAN:  -continue with prevacid  30mg  BID for now, pt to let me know if she wishes to consider other PPI -schedule EGD +/- dilation with bravo testing ON PPI  -good reflux precautions -chewing precautions -follow up with PCP/oncology regarding pain near xyphoid process   All questions were answered, patient verbalized understanding and is in agreement with plan as outlined above.    Follow Up: 3 months   Wendy Booton L. Kameela Leipold, MSN, APRN, AGNP-C Adult-Gerontology Nurse Practitioner [facility]  "

## 2024-12-20 NOTE — Patient Instructions (Signed)
-  We will get you scheduled for EGD with bravo placement (to look at the acid levels, as discussed) -Continue with prevacid  30mg  twice daily for now -Take small bites, chew thoroughly, avoid thicker, drier foods and take sips of liquids between each bit -be mindful of greasy, spicy, fried, citrus foods, caffeine , carbonated drinks, chocolate and alcohol as these can increase reflux symptoms Stay upright 2-3 hours after eating, prior to lying down and avoid eating late in the evenings.  Follow up 3 months  It was a pleasure to see you today. I want to create trusting relationships with patients and provide genuine, compassionate, and quality care. I truly value your feedback! please be on the lookout for a survey regarding your visit with me today. I appreciate your input about our visit and your time in completing this!    Devin Ganaway L. Hugh Kamara, MSN, APRN, AGNP-C Adult-Gerontology Nurse Practitioner [facility]

## 2024-12-21 ENCOUNTER — Telehealth: Payer: Self-pay | Admitting: *Deleted

## 2024-12-21 NOTE — Telephone Encounter (Signed)
 Spoke with pt. She has been scheduled for EGD +/-dil/bravo (on ppi) with Dr. Cinderella, asa 3 on 01/27/25. Aware will mail packet with her instructions.

## 2024-12-22 ENCOUNTER — Other Ambulatory Visit: Payer: Self-pay

## 2024-12-22 ENCOUNTER — Inpatient Hospital Stay

## 2024-12-22 VITALS — BP 117/76 | HR 85 | Temp 96.4°F | Resp 16

## 2024-12-22 DIAGNOSIS — C50919 Malignant neoplasm of unspecified site of unspecified female breast: Secondary | ICD-10-CM

## 2024-12-22 DIAGNOSIS — Z5112 Encounter for antineoplastic immunotherapy: Secondary | ICD-10-CM | POA: Diagnosis not present

## 2024-12-22 DIAGNOSIS — M810 Age-related osteoporosis without current pathological fracture: Secondary | ICD-10-CM

## 2024-12-22 LAB — COMPREHENSIVE METABOLIC PANEL WITH GFR
ALT: 36 U/L (ref 0–44)
AST: 36 U/L (ref 15–41)
Albumin: 4.1 g/dL (ref 3.5–5.0)
Alkaline Phosphatase: 78 U/L (ref 38–126)
Anion gap: 13 (ref 5–15)
BUN: 15 mg/dL (ref 8–23)
CO2: 21 mmol/L — ABNORMAL LOW (ref 22–32)
Calcium: 9.5 mg/dL (ref 8.9–10.3)
Chloride: 107 mmol/L (ref 98–111)
Creatinine, Ser: 0.66 mg/dL (ref 0.44–1.00)
GFR, Estimated: >60 mL/min
Glucose, Bld: 95 mg/dL (ref 70–99)
Potassium: 4.2 mmol/L (ref 3.5–5.1)
Sodium: 141 mmol/L (ref 135–145)
Total Bilirubin: 0.2 mg/dL (ref 0.0–1.2)
Total Protein: 6.7 g/dL (ref 6.5–8.1)

## 2024-12-22 LAB — CBC WITH DIFFERENTIAL/PLATELET
Abs Immature Granulocytes: 0.01 K/uL (ref 0.00–0.07)
Basophils Absolute: 0.1 K/uL (ref 0.0–0.1)
Basophils Relative: 1 %
Eosinophils Absolute: 0.1 K/uL (ref 0.0–0.5)
Eosinophils Relative: 2 %
HCT: 39.8 % (ref 36.0–46.0)
Hemoglobin: 13.6 g/dL (ref 12.0–15.0)
Immature Granulocytes: 0 %
Lymphocytes Relative: 30 %
Lymphs Abs: 1.3 K/uL (ref 0.7–4.0)
MCH: 33.3 pg (ref 26.0–34.0)
MCHC: 34.2 g/dL (ref 30.0–36.0)
MCV: 97.3 fL (ref 80.0–100.0)
Monocytes Absolute: 0.5 K/uL (ref 0.1–1.0)
Monocytes Relative: 11 %
Neutro Abs: 2.3 K/uL (ref 1.7–7.7)
Neutrophils Relative %: 56 %
Platelets: 330 K/uL (ref 150–400)
RBC: 4.09 MIL/uL (ref 3.87–5.11)
RDW: 15.7 % — ABNORMAL HIGH (ref 11.5–15.5)
WBC: 4.1 K/uL (ref 4.0–10.5)
nRBC: 0 % (ref 0.0–0.2)

## 2024-12-22 LAB — MAGNESIUM: Magnesium: 2.2 mg/dL (ref 1.7–2.4)

## 2024-12-22 MED ORDER — ACETAMINOPHEN 325 MG PO TABS
650.0000 mg | ORAL_TABLET | Freq: Once | ORAL | Status: AC
  Administered 2024-12-22: 650 mg via ORAL
  Filled 2024-12-22: qty 2

## 2024-12-22 MED ORDER — PALONOSETRON HCL INJECTION 0.25 MG/5ML
0.2500 mg | Freq: Once | INTRAVENOUS | Status: AC
  Administered 2024-12-22: 0.25 mg via INTRAVENOUS
  Filled 2024-12-22: qty 5

## 2024-12-22 MED ORDER — DEXTROSE 5 % IV SOLN: INTRAVENOUS | Status: DC

## 2024-12-22 MED ORDER — DENOSUMAB 120 MG/1.7ML ~~LOC~~ SOLN
120.0000 mg | Freq: Once | SUBCUTANEOUS | Status: AC
  Administered 2024-12-22: 120 mg via SUBCUTANEOUS
  Filled 2024-12-22: qty 1.7

## 2024-12-22 MED ORDER — CETIRIZINE HCL 10 MG/ML IV SOLN
10.0000 mg | Freq: Once | INTRAVENOUS | Status: AC
  Administered 2024-12-22: 10 mg via INTRAVENOUS
  Filled 2024-12-22: qty 1

## 2024-12-22 MED ORDER — DEXAMETHASONE SOD PHOSPHATE PF 10 MG/ML IJ SOLN
10.0000 mg | Freq: Once | INTRAMUSCULAR | Status: AC
  Administered 2024-12-22: 10 mg via INTRAVENOUS

## 2024-12-22 MED ORDER — SODIUM CHLORIDE 0.9 % IV SOLN
150.0000 mg | Freq: Once | INTRAVENOUS | Status: AC
  Administered 2024-12-22: 150 mg via INTRAVENOUS
  Filled 2024-12-22: qty 150

## 2024-12-22 MED ORDER — FAM-TRASTUZUMAB DERUXTECAN-NXKI CHEMO 100 MG IV SOLR
5.4000 mg/kg | Freq: Once | INTRAVENOUS | Status: AC
  Administered 2024-12-22: 400 mg via INTRAVENOUS
  Filled 2024-12-22: qty 20

## 2024-12-22 NOTE — Patient Instructions (Signed)
 [facility] [place] - A DEPT OF [facility]  Discharge Instructions: Thank you for choosing [facility] to provide your oncology and hematology care.  If you have a lab appointment with the Cancer Center - please note that after April 8th, 2024, all labs will be drawn in the cancer center.  You do not have to check in or register with the main entrance as you have in the past but will complete your check-in in the cancer center.  Wear comfortable clothing and clothing appropriate for easy access to any Portacath or PICC line.   We strive to give you quality time with your provider. You may need to reschedule your appointment if you arrive late (15 or more minutes).  Arriving late affects you and other patients whose appointments are after yours.  Also, if you miss three or more appointments without notifying the office, you may be dismissed from the clinic at the provider's discretion.      For prescription refill requests, have your pharmacy contact our office and allow 72 hours for refills to be completed.    Today you received the following chemotherapy and/or immunotherapy agents Enhertu   To help prevent nausea and vomiting after your treatment, we encourage you to take your nausea medication as directed.  BELOW ARE SYMPTOMS THAT SHOULD BE REPORTED IMMEDIATELY: *FEVER GREATER THAN 100.4 F (38 C) OR HIGHER *CHILLS OR SWEATING *NAUSEA AND VOMITING THAT IS NOT CONTROLLED WITH YOUR NAUSEA MEDICATION *UNUSUAL SHORTNESS OF BREATH *UNUSUAL BRUISING OR BLEEDING *URINARY PROBLEMS (pain or burning when urinating, or frequent urination) *BOWEL PROBLEMS (unusual diarrhea, constipation, pain near the anus) TENDERNESS IN MOUTH AND THROAT WITH OR WITHOUT PRESENCE OF ULCERS (sore throat, sores in mouth, or a toothache) UNUSUAL RASH, SWELLING OR PAIN  UNUSUAL VAGINAL DISCHARGE OR ITCHING   Items with * indicate a potential emergency and should be followed up as  soon as possible or go to the Emergency Department if any problems should occur.  Please show the CHEMOTHERAPY ALERT CARD or IMMUNOTHERAPY ALERT CARD at check-in to the Emergency Department and triage nurse.  Should you have questions after your visit or need to cancel or reschedule your appointment, please contact [facility] [place] - A DEPT OF [facility] [phone]  and follow the prompts.  Office hours are 8:00 a.m. to 4:30 p.m. Monday - Friday. Please note that voicemails left after 4:00 p.m. may not be returned until the following business day.  We are closed weekends and major holidays. You have access to a nurse at all times for urgent questions. Please call the main number to the clinic [phone] and follow the prompts.  For any non-urgent questions, you may also contact your provider using MyChart. We now offer e-Visits for anyone 41 and older to request care online for non-urgent symptoms. For details visit [email or web address].   Also download the MyChart app! Go to the app store, search "MyChart", open the app, select , and log in with your MyChart username and password.

## 2024-12-22 NOTE — Progress Notes (Signed)
 Patient presents today for Enhertu  infusion and Udenyca injection per providers order.  Vital signs and labs within parameters for treatment.   Xgeva  administration without incident; injection site WNL; see MAR for injection details.  Treatment given today per MD orders.  Stable during infusion without adverse affects.  Vital signs stable.  No complaints at this time.  Discharge from clinic ambulatory in stable condition.  Alert and oriented X 3.  Follow up with [facility] as scheduled.

## 2024-12-23 ENCOUNTER — Other Ambulatory Visit: Payer: Self-pay

## 2024-12-23 LAB — CANCER ANTIGEN 27.29: CA 27.29: 36.7 U/mL (ref 0.0–38.6)

## 2024-12-23 LAB — CANCER ANTIGEN 15-3: CA 15-3: 33 U/mL — ABNORMAL HIGH (ref 0.0–25.0)

## 2024-12-27 ENCOUNTER — Encounter: Payer: Self-pay | Admitting: *Deleted

## 2024-12-27 ENCOUNTER — Ambulatory Visit

## 2024-12-27 VITALS — BP 120/79 | HR 94 | Ht 64.0 in | Wt 167.0 lb

## 2024-12-27 DIAGNOSIS — I10 Essential (primary) hypertension: Secondary | ICD-10-CM

## 2024-12-27 MED ORDER — AMLODIPINE BESYLATE 5 MG PO TABS: 5.0000 mg | ORAL_TABLET | Freq: Every day | ORAL | 3 refills | Status: AC

## 2024-12-27 NOTE — Progress Notes (Unsigned)
" ° °  Established Patient Office Visit  Subjective   Patient ID: Wendy Zamora, female    DOB: May 09, 1961  Age: 63 y.o. MRN: [id number]  Chief Complaint  Patient presents with   Medical Management of Chronic Issues    6 month follow up    HPI  Patient Active Problem List   Diagnosis Date Noted   Metastasis to bone (HCC) 08/29/2024   Neoplasm related pain 08/29/2024   Peripheral neuropathy 08/29/2024   Genetic testing 01/20/2024   Constipation 01/15/2024   Bilateral swelling of feet 09/29/2023   Carpal tunnel syndrome of right wrist 06/16/2023   Insomnia 03/04/2023   Chronic musculoskeletal pain 03/04/2023   Annual physical exam 04/18/2022   Chest pain, musculoskeletal 04/18/2022   Abdominal pain 04/18/2022   Hyperlipidemia 04/18/2022   Dizziness 01/19/2022   Obesity (BMI 30-39.9) 01/19/2022   Vitamin B 12 deficiency 01/19/2022   Current every day smoker 01/19/2022   Lymphedema of arm 01/18/2022   Essential hypertension 01/18/2022   Asthma 01/18/2022   Migraine 01/18/2022   Allergies 01/18/2022   Osteoporosis 01/18/2022   GERD (gastroesophageal reflux disease) 12/27/2021   Dysphagia 12/27/2021   Primary malignant neoplasm of breast with metastasis (HCC) 07/06/2021   History of bilateral mastectomy 01/05/2020    ROS    Objective:     BP 120/79   Pulse 94   Ht 5' 4 (1.626 m)   Wt 167 lb (75.8 kg)   LMP 06/08/2012   SpO2 96%   BMI 28.67 kg/m  BP Readings from Last 3 Encounters:  12/27/24 120/79  12/22/24 117/76  12/22/24 130/77   Wt Readings from Last 3 Encounters:  12/27/24 167 lb (75.8 kg)  12/22/24 169 lb 3.2 oz (76.7 kg)  12/20/24 170 lb 3.2 oz (77.2 kg)     Physical Exam Vitals and nursing note reviewed.  Constitutional:      Appearance: Normal appearance.  HENT:     Head: Normocephalic.  Eyes:     Extraocular Movements: Extraocular movements intact.     Pupils: Pupils are equal, round, and reactive to light.  Cardiovascular:     Rate  and Rhythm: Normal rate and regular rhythm.  Pulmonary:     Effort: Pulmonary effort is normal.     Breath sounds: Normal breath sounds.  Musculoskeletal:     Cervical back: Normal range of motion and neck supple.  Neurological:     Mental Status: She is alert and oriented to person, place, and time.  Psychiatric:        Mood and Affect: Mood normal.        Thought Content: Thought content normal.     No results found for any visits on 12/27/24.  {Labs (Optional):23779}  The 10-year ASCVD risk score (Arnett DK, et al., 2019) is: 10.4%    Assessment & Plan:   Problem List Items Addressed This Visit       Cardiovascular and Mediastinum   Essential hypertension - Primary    No follow-ups on file.    Leita Longs, FNP  "

## 2024-12-28 ENCOUNTER — Other Ambulatory Visit: Payer: Self-pay

## 2024-12-28 NOTE — Assessment & Plan Note (Signed)
 Blood pressure well-controlled with amlodipine . - Continue amlodipine  with refills.

## 2025-01-06 ENCOUNTER — Encounter (HOSPITAL_COMMUNITY)
Admission: RE | Admit: 2025-01-06 | Discharge: 2025-01-06 | Disposition: A | Source: Ambulatory Visit | Attending: Oncology

## 2025-01-06 DIAGNOSIS — C50919 Malignant neoplasm of unspecified site of unspecified female breast: Secondary | ICD-10-CM | POA: Insufficient documentation

## 2025-01-06 MED ORDER — FLUDEOXYGLUCOSE F - 18 (FDG) INJECTION
8.5600 | Freq: Once | INTRAVENOUS | Status: AC | PRN
  Administered 2025-01-06: 8.56 via INTRAVENOUS

## 2025-01-12 ENCOUNTER — Other Ambulatory Visit: Payer: Self-pay | Admitting: *Deleted

## 2025-01-12 ENCOUNTER — Inpatient Hospital Stay: Admitting: Oncology

## 2025-01-12 ENCOUNTER — Inpatient Hospital Stay

## 2025-01-12 ENCOUNTER — Inpatient Hospital Stay: Attending: Hematology

## 2025-01-12 DIAGNOSIS — E611 Iron deficiency: Secondary | ICD-10-CM | POA: Insufficient documentation

## 2025-01-12 DIAGNOSIS — T451X5A Adverse effect of antineoplastic and immunosuppressive drugs, initial encounter: Secondary | ICD-10-CM | POA: Diagnosis not present

## 2025-01-12 DIAGNOSIS — C787 Secondary malignant neoplasm of liver and intrahepatic bile duct: Secondary | ICD-10-CM | POA: Insufficient documentation

## 2025-01-12 DIAGNOSIS — Z170 Estrogen receptor positive status [ER+]: Secondary | ICD-10-CM | POA: Insufficient documentation

## 2025-01-12 DIAGNOSIS — Z923 Personal history of irradiation: Secondary | ICD-10-CM | POA: Insufficient documentation

## 2025-01-12 DIAGNOSIS — Z9013 Acquired absence of bilateral breasts and nipples: Secondary | ICD-10-CM | POA: Diagnosis not present

## 2025-01-12 DIAGNOSIS — Z79899 Other long term (current) drug therapy: Secondary | ICD-10-CM

## 2025-01-12 DIAGNOSIS — Z1732 Human epidermal growth factor receptor 2 negative status: Secondary | ICD-10-CM | POA: Diagnosis not present

## 2025-01-12 DIAGNOSIS — Z79811 Long term (current) use of aromatase inhibitors: Secondary | ICD-10-CM | POA: Diagnosis not present

## 2025-01-12 DIAGNOSIS — Z7982 Long term (current) use of aspirin: Secondary | ICD-10-CM | POA: Diagnosis not present

## 2025-01-12 DIAGNOSIS — G893 Neoplasm related pain (acute) (chronic): Secondary | ICD-10-CM | POA: Insufficient documentation

## 2025-01-12 DIAGNOSIS — C773 Secondary and unspecified malignant neoplasm of axilla and upper limb lymph nodes: Secondary | ICD-10-CM | POA: Insufficient documentation

## 2025-01-12 DIAGNOSIS — R21 Rash and other nonspecific skin eruption: Secondary | ICD-10-CM | POA: Diagnosis not present

## 2025-01-12 DIAGNOSIS — Z5112 Encounter for antineoplastic immunotherapy: Secondary | ICD-10-CM | POA: Insufficient documentation

## 2025-01-12 DIAGNOSIS — C7951 Secondary malignant neoplasm of bone: Secondary | ICD-10-CM | POA: Diagnosis not present

## 2025-01-12 DIAGNOSIS — C50412 Malignant neoplasm of upper-outer quadrant of left female breast: Secondary | ICD-10-CM | POA: Diagnosis present

## 2025-01-12 DIAGNOSIS — G620 Drug-induced polyneuropathy: Secondary | ICD-10-CM | POA: Insufficient documentation

## 2025-01-12 DIAGNOSIS — Z1721 Progesterone receptor positive status: Secondary | ICD-10-CM | POA: Diagnosis not present

## 2025-01-12 DIAGNOSIS — C50919 Malignant neoplasm of unspecified site of unspecified female breast: Secondary | ICD-10-CM | POA: Diagnosis not present

## 2025-01-12 DIAGNOSIS — G6289 Other specified polyneuropathies: Secondary | ICD-10-CM | POA: Diagnosis not present

## 2025-01-12 LAB — CBC WITH DIFFERENTIAL/PLATELET
Abs Immature Granulocytes: 0.02 K/uL (ref 0.00–0.07)
Basophils Absolute: 0.1 K/uL (ref 0.0–0.1)
Basophils Relative: 2 %
Eosinophils Absolute: 0.1 K/uL (ref 0.0–0.5)
Eosinophils Relative: 2 %
HCT: 41.3 % (ref 36.0–46.0)
Hemoglobin: 13.7 g/dL (ref 12.0–15.0)
Immature Granulocytes: 0 %
Lymphocytes Relative: 31 %
Lymphs Abs: 1.5 K/uL (ref 0.7–4.0)
MCH: 32.5 pg (ref 26.0–34.0)
MCHC: 33.2 g/dL (ref 30.0–36.0)
MCV: 97.9 fL (ref 80.0–100.0)
Monocytes Absolute: 0.5 K/uL (ref 0.1–1.0)
Monocytes Relative: 11 %
Neutro Abs: 2.6 K/uL (ref 1.7–7.7)
Neutrophils Relative %: 54 %
Platelets: 350 K/uL (ref 150–400)
RBC: 4.22 MIL/uL (ref 3.87–5.11)
RDW: 15.6 % — ABNORMAL HIGH (ref 11.5–15.5)
WBC: 4.8 K/uL (ref 4.0–10.5)
nRBC: 0 % (ref 0.0–0.2)

## 2025-01-12 LAB — COMPREHENSIVE METABOLIC PANEL WITH GFR
ALT: 34 U/L (ref 0–44)
AST: 34 U/L (ref 15–41)
Albumin: 4 g/dL (ref 3.5–5.0)
Alkaline Phosphatase: 77 U/L (ref 38–126)
Anion gap: 12 (ref 5–15)
BUN: 19 mg/dL (ref 8–23)
CO2: 22 mmol/L (ref 22–32)
Calcium: 9.8 mg/dL (ref 8.9–10.3)
Chloride: 109 mmol/L (ref 98–111)
Creatinine, Ser: 0.76 mg/dL (ref 0.44–1.00)
GFR, Estimated: >60 mL/min
Glucose, Bld: 113 mg/dL — ABNORMAL HIGH (ref 70–99)
Potassium: 4 mmol/L (ref 3.5–5.1)
Sodium: 142 mmol/L (ref 135–145)
Total Bilirubin: 0.3 mg/dL (ref 0.0–1.2)
Total Protein: 7 g/dL (ref 6.5–8.1)

## 2025-01-12 LAB — MAGNESIUM: Magnesium: 2.1 mg/dL (ref 1.7–2.4)

## 2025-01-12 NOTE — Progress Notes (Signed)
 DISCONTINUE ON PATHWAY REGIMEN - Breast     A cycle is every 21 days:     Fam-trastuzumab deruxtecan-nxki    **Always confirm dose/schedule in your pharmacy ordering system**  PRIOR TREATMENT: [id number]: Fam-trastuzumab  Deruxtecan 5.4 mg/kg q21 Days  START ON PATHWAY REGIMEN - Breast     A cycle is every 21 days:     Sacituzumab govitecan-hziy   **Always confirm dose/schedule in your pharmacy ordering system**  Patient Characteristics: Distant Metastases or Locoregional Recurrent Disease - Unresectable, M0 or Locally Advanced Unresectable Disease Progressing after Neoadjuvant and Local Therapies, M0, HER2 Negative/Ultralow/Low, ER Positive, Chemotherapy, HER2 Ultralow/Low, Third Line  and Beyond, [employer] or Not a Candidate for Molecular Targeted Therapy Therapeutic Status: Distant Metastases ER Status: Positive (+) PR Status: Positive (+) HER2 Status: Low Therapy Approach Indicated: Standard Chemotherapy/Endocrine Therapy Line of Therapy: Third Line and Beyond Intent of Therapy: Non-Curative / Palliative Intent, Discussed with Patient

## 2025-01-12 NOTE — Progress Notes (Signed)
 Pharmacist Chemotherapy Monitoring - Initial Assessment    Anticipated start date: 01/20/25   The following has been reviewed per standard work regarding the patient's treatment regimen: The patient's diagnosis, treatment plan and drug doses, and organ/hematologic function Lab orders and baseline tests specific to treatment regimen  The treatment plan start date, drug sequencing, and pre-medications Prior authorization status  Patient's documented medication list, including drug-drug interaction screen and prescriptions for anti-emetics and supportive care specific to the treatment regimen The drug concentrations, fluid compatibility, administration routes, and timing of the medications to be used The patient's access for treatment and lifetime cumulative dose history, if applicable  The patient's medication allergies and previous infusion related reactions, if applicable   Changes made to treatment plan:  treatment plan date Added day 9 Udenyca 6 mg subcutaneous x 1  Follow up needed:  Pending authorization for treatment    [name], [facility], 01/12/2025  1:44 PM

## 2025-01-12 NOTE — Progress Notes (Signed)
 Patient will not receive treatment today due to progression. Patient will return to the clinic to start [facility] next week per Dr.Kandala.

## 2025-01-12 NOTE — Progress Notes (Signed)
 Pharmacist Chemotherapy Monitoring - Initial Assessment    Anticipated start date: 01/19/25   The following has been reviewed per standard work regarding the patient's treatment regimen: The patient's diagnosis, treatment plan and drug doses, and organ/hematologic function Lab orders and baseline tests specific to treatment regimen  The treatment plan start date, drug sequencing, and pre-medications Prior authorization status  Patient's documented medication list, including drug-drug interaction screen and prescriptions for anti-emetics and supportive care specific to the treatment regimen The drug concentrations, fluid compatibility, administration routes, and timing of the medications to be used The patient's access for treatment and lifetime cumulative dose history, if applicable  The patient's medication allergies and previous infusion related reactions, if applicable   Changes made to treatment plan:  treatment plan date Udenyca 6 mg subcutaneous to day 10  Follow up needed:  Pending authorization for treatment    Wendy Zamora Molt, [facility], 01/12/2025  1:51 PM

## 2025-01-12 NOTE — Patient Instructions (Signed)
 [facility] at [facility] Discharge Instructions   You were seen and examined today by Dr. Davonna.  She reviewed the results of your lab work which are normal/stable.   She reviewed the results of your PET scan. It is showing there are new spots of cancer in the liver and in the bones.   We will hold your treatment today. We will need to change your treatment. We will treat you with a medication called Trodelvy. It is given in clinic two weeks in a row with one week off.   Return as scheduled.    Thank you for choosing [facility] at [facility] to provide your oncology and hematology care.  To afford each patient quality time with our provider, please arrive at least 15 minutes before your scheduled appointment time.   If you have a lab appointment with the Cancer Center please come in thru the Main Entrance and check in at the main information desk.  You need to re-schedule your appointment should you arrive 10 or more minutes late.  We strive to give you quality time with our providers, and arriving late affects you and other patients whose appointments are after yours.  Also, if you no show three or more times for appointments you may be dismissed from the clinic at the providers discretion.     Again, thank you for choosing [facility].  Our hope is that these requests will decrease the amount of time that you wait before being seen by our physicians.       _____________________________________________________________  Should you have questions after your visit to [facility], please contact our office at [phone] and follow the prompts.  Our office hours are 8:00 a.m. and 4:30 p.m. Monday - Friday.  Please note that voicemails left after 4:00 p.m. may not be returned until the following business day.  We are closed weekends and major holidays.  You do have access to a nurse 24-7, just call the main number to  the clinic [phone] and do not press any options, hold on the line and a nurse will answer the phone.    For prescription refill requests, have your pharmacy contact our office and allow 72 hours.    Due to Covid, you will need to wear a mask upon entering the hospital. If you do not have a mask, a mask will be given to you at the Main Entrance upon arrival. For doctor visits, patients may have 1 support person age 37 or older with them. For treatment visits, patients can not have anyone with them due to social distancing guidelines and our immunocompromised population.

## 2025-01-12 NOTE — Progress Notes (Signed)
 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 female with metastatic breast cancer to the bones, ER/PR positive and HER2 negative:  Assessment and Plan Assessment & Plan Metastatic breast cancer to bone Metastatic breast cancer with bone involvement. ER/PR positive and HER2 negative. Oncology history below  Patient currently is on Enhertu  every 21 days.   - We reviewed the PET scan findings together. Patient has disease progression with new liver lesions and also progression of osseous metastatic disease - Will hold treatment with Enhertu  today.  - Discussed next line of treatment option with sacituzumab Govitecan. Risks Vs benefits discussed in detail along with common side effects -Common side effects of sacituzumab govitecan include low blood cell counts (especially neutropenia), nausea, diarrhea, fatigue, hair loss, and decreased appetite, which can be severe in some patients. It also carries risks of serious or life-threatening complications like severe neutropenia, significant diarrhea, and allergic or infusion-related reactions. -Labs reviewed today: CMP: WNL, CBC: WNL, last CA 15-3: 33.0, CA 27.29: 36.7 -Will send guardant 360 today - Next line of treatment options can be pembrolizumab (TMB high) or Dato Dxd for the patient at progression. Discussed poor prognosis.  - Repeat PET scan in 3 months - Recent ECHO with LVEF 65-70%. Will repeat in 3 months . Ordered today.   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  - Continue Flexeril  and hydrocodone  as needed for pain management. - Monitor pain levels and adjust treatment as necessary. -Recommended palliative radiation to bone lesions for pain control if needed. Patient wants to wait at this time - Continue denosumab  every 6 weeeks  Rash, likely drug-related Rash with itching and erythematous rash, likely related to Enhertu . Managed with hydrocortisone and Benadryl  creams. Rash is improving.  - Continue hydrocortisone and Benadryl  creams for rash management.  Chemotherapy-induced peripheral neuropathy Peripheral neuropathy present since initial chemotherapy, managed with Lyrica . No worsening of symptoms reported.  - Continue Lyrica  for neuropathy management.  Iron deficiency Iron deficiency with saturation ratio less than 20. Iron levels are low but within range.  - Hold off on starting iron supplements as patient is worried about constipation - Monitor iron levels and reassess need for supplementation.   The patient understands the plans discussed today and is in agreement with them.  She knows to contact our office if she develops concerns prior to her next appointment.  The total time spent in the appointment was 40 minutes for the encounter with patient, including review of chart and various tests results, discussions about plan of care and coordination of care plan    Mickiel Dry, MD  [facility] [place] - A DEPT OF [facility] [address] [place] [address] Dept: [phone] Dept Fax: [phone]   No orders of the defined types were placed in this encounter.    ONCOLOGY HISTORY:   I have reviewed her chart and materials related to her cancer extensively  and collaborated history with the patient. Summary of oncologic history is as follows:   Metastatic breast cancer to the bones, ER/PR positive and HER2  negative:  -04/01/2019:Presentation: Pinkish discoloration of upper aspect of left breast followed by skin thickening medially and laterally. Baseball size lump palpated in left breast (from documentation) -04/09.2020: Mammogram: Abnormal density in the left breast measuring 94 x 60 x 60 mm. A number of concerning lymph nodes were noted also. Skin was thickened. An ultrasound showed a 14 x 10 x 16 mm irregular mass and a number of concerning lymph nodes with cortical thickening. (from documentation) -04/14/2019: Left breast biopsy 2:00: Invasive carcinoma (ductal type, with lobular features), grade 2, total score: 6, ER positive, PR positive, HER2 negative [facility]) - 04/28/2019: [facility] germline testing: Negative -05/05/2019-09/16/2019: 4 cycles of Cyclophosphamide and dd epirubicin followed by 12 cycles of weekly paclitaxel  (from documentation) -10/02/2019: Bilateral mastectomies. Pathology:  -Right breast was benign.  Left breast showed residual multicentric invasive ductal cancer, grade 2.  Largest residual tumor mass was at least 8 cm.  Fibrosis was noted, consistent with chemotherapy effect.  Lymphovascular invasion identified.  Negative surgical margins.  3 of 10 lymph nodes showed metastases as well as 1 of 2 sentinel lymph nodes. -Sentinel node #1 had a 6 mm metastasis with extracapsular extension.  Sentinel node 2 was negative.  1 additional node with extracapsular extension.  Another node had fibrosis consistent with chemotherapy effect. -At least 5 foci of cancer were described.  Nuclear pleomorphism score 2, mitotic rate score 2, glandular differentiation score 2.  ER positive, strong intensity staining, 75% of cells and PR positive, strong intensity staining, 95% of cells.  HER-2/neu 2+ by IHC so equivocal.  Also equivocal by FISH.  HER-2 signals 5.5 and CEN 17 signals 3.7 so ratio 1.5.(from documentation, not available under media) -12/09/2019- 11/2020:  Anastrazole 1mg  daily. Discontinued on progression -02/03/2020-Current: Monthly Denosumab  -02/29/2020-04/12/2020: RT to regional lymph nodes and chest wall -11/08/2020: CT chest: New sclerotic lesions noted mid thoracic spine. Previously noted left hilar lymph nodes have resolved. No new lymphadenopathy.  -11/24/2020: PET: Bilateral mastectomies with low-grade mildly heterogeneous tracer uptake in the left anterior chest wall reflecting post radiation/treatment changes.  Widespread osseous metastasis in T7, T8, T9, T12, L2, L3, L4, L5, midsternum, right acetabulum, right femoral neck, right proximal femur, left acetabulum, right anterior fifth rib. The most intense uptake is seen in the right L4 vertebral body with 10.4 SUV. No pathologic fracture is appreciated at this time.  -12/05/2020- 07/23/2023: Palbociclib  + Fulvestrant . Ibrance  was dose reduced to 100 mg 3 weeks on/1 week off on 10/11/2021 due to fatigue.  Discontinued due to progression  -04/13/2021: PET: Widespread hypermetabolic osseous metastatic disease on prior exam shows a marked interval metabolic improvement. On today's exam no metabolic activity is demonstrated above normal osseous background. Some of the sclerotic lesions do show some progression with regard to size and density.  -07/06/2021: CA 15-3:34, CA 27.29:48.7 -08/10/2021: PET: No evidence of hypermetabolic metastatic disease. Widespread scattered sclerotic bone metastases in the spine and pelvis are stable and not metabolically active. No lymphadenopathy.  -03/21/2022: CT CAP: No evidence of lymphadenopathy or soft tissue metastatic disease in the chest, abdomen, or pelvis. Unchanged sclerotic osseous metastatic disease involving the vertebral bodies, bony pelvis, and right femoral neck. Status post left mastectomy. -03/21/2022: NM bone scan: Increased radiotracer uptake in the thoracolumbar spine and RIGHT anterolateral fifth rib consistent with known osseous metastatic  disease -09/16/2022: NM bone  scan: Right mid rib focus of increased activity again noted. Thoracic and lumbar spine focal areas of increased activity again noted. These findings are consistent with known metastatic disease. Similar findings noted on prior exam. A tiny new focus of increased activity in an upper lumbar vertebral body cannot be excluded. -10/04/2022: CT CAP: Unchanged sclerotic osseous metastatic disease involving the thoracic spine, lumbar spine, pelvis, and right femoral neck. No evidence of new or progressive metastatic disease in the chest, abdomen or pelvis. -04/02/2023: NM bone scan: Thoracolumbar and right rib metastases, unchanged. -04/07/2023: CA 15-3:35.4, CA 27.29:39 -04/07/2023: CT CAP: Unchanged sclerotic osseous metastases throughout the ribs, thoracic and lumbar vertebral bodies, pelvis, and right femoral neck. No evidence of new metastatic disease in the chest, abdomen, or pelvis. Status post bilateral mastectomy.  -07/17/2023:PET: Intense metabolic activity associated with sclerotic lesions in the T7 and T9 vertebral bodies consistent with active metastatic disease (breast cancer skeletal metastasis recurrence). No metabolic activity associated with sclerotic lesion in the T8 or L4 vertebral body. Mild activity associated with sclerotic lesion in the posterior RIGHT acetabulum. No soft tissue metastasis.  No lymphadenopathy -08/07/2023-10/20/2023: Ribociclib  + exemestane . Held for rash -10/30/2023: Stable exam. Persistent increased radiotracer uptake within the sclerotic T9 and T7 vertebral metastasis. No new or progressive sites of disease identified. -01/16/2024: Letrozole  2.5mg  daily. Discontinued after 1 dose due to irritation of the throat and tongue.  -02/03/2024-06/16/2024:Elacestrant  258 mg daily. Discontinued due to progression  -06/03/2024: CA 15-3:45.7, CA 27.29:67.3 -06/10/2024: EZU:Emnhmzddpnw of skeletal metastasis with multiple new hypermetabolic lesions in  the pelvis, spine and there are approximately 20 skeletal lesions now. Previously 2 distinct thoracic spine lesions.Stable lesions in the thoracic spine. No evidence of visceral metastasis. -06/30/2024: ESR1 D538G. TMB: 13.29mut/Mb, MSI-H: not detected, HRD: Negative. -06/30/2024: ECHO: LVEF:55-60% -07/07/2024-Current: Enhertu  5.4mg /kg every 21 days -08/18/2024: CA 15-3:36, CA 27.29:NA  -09/08/2024: CA 27.29: 50.3 - 09/23/2024: PET scan: Marked interval improvement in skeletal metastasis. Majority of the skeletal metastasis have resolved completely. Two hypermetabolic lesions remain in the RIGHT sacrum and RIGHT pubic ramus. No evidence of soft tissue metastasis. -10/21/2024: ECHO: LVEF: 65-70% -01/06/2025: PET scan : New hypermetabolic liver lesions, including a 1.8 cm lesion in segment 6 of the liver with maximum SUV 7.6, compatible with malignancy. Progression of osseous metastatic disease with new hypermetabolic activity in a few pre-existing sclerotic lesions, including an index lesion centrally in the T12 vertebral body (maximum SUV 6.8, previously 2.9) and an index lesion in the right ischial tuberosity (maximum SUV 10.4, formerly 6.3), with the widespread scattered sclerotic lesions otherwise generally unchanged on CT.   Current Treatment: Planned Sacituzumab Govitecan  INTERVAL HISTORY:    Discussed the use of AI scribe software for clinical note transcription with the patient, who gave verbal consent to proceed.  History of Present Illness Wendy Zamora is a 65 year old female with metastatic breast cancer who presents for oncology follow-up.   She has metastatic breast cancer with progression on recent PET scan, demonstrating new hepatic lesions, enlargement and increased uptake of existing bone metastases, and possible new bone lesions. Metastatic sites include the liver, T12 vertebra, and left sixth rib. She previously received chest radiation but has not undergone radiation for  current bone metastases. She continues Xgeva  every six weeks for bone health.  She reports worsening pain, particularly in the back and right hip, with localized tenderness exacerbated by pressure. Hydrocodone  provides limited relief. She is uncertain if the pain is due to muscle spasm or direct osseous involvement. She  also notes increased neuropathy symptoms.   I have reviewed the past medical history, past surgical history, social history and family history with the patient and they are unchanged from previous note.  ALLERGIES:  is allergic to augmentin [amoxicillin-pot clavulanate], ciprofloxacin, claritin [loratadine], gabapentin, keflex [cephalexin], levofloxacin, lisinopril, lisinopril, metoprolol, metoprolol, nexium [esomeprazole], sulfa antibiotics, tetracyclines & related, iohexol , ultram  [tramadol ], anastrozole , ceftin [cefuroxime axetil], chromium, exemestane , iodinated contrast media, kisqali  (200 mg dose) [ribociclib  succ (200 mg dose)], misc. sulfonamide containing compounds, oxycodone, sulfa antibiotics, latex, and nickel.  MEDICATIONS:  Current Outpatient Medications  Medication Sig Dispense Refill   amLODipine  (NORVASC ) 5 MG tablet Take 1 tablet (5 mg total) by mouth daily. 90 tablet 3   Ascorbic Acid (VITAMIN C) 1000 MG tablet Take 1,000 mg by mouth daily.     aspirin  81 MG EC tablet Take 81 mg by mouth daily.     Calcium-Magnesium-Vitamin D  (CALCIUM 1200+D3 PO) Take 1 tablet by mouth daily.     cetirizine  (ZYRTEC ) 10 MG tablet Take 10 mg by mouth daily.     CINNAMON PO Take 1,000 mg by mouth daily.     cyanocobalamin  1000 MCG tablet Take 1 tablet (1,000 mcg total) by mouth daily. (Patient taking differently: Take 1,000 mcg by mouth every other day.) 30 tablet 6   cyclobenzaprine  (FLEXERIL ) 10 MG tablet Take 1 tablet (10 mg total) by mouth 2 (two) times daily as needed for muscle spasms. 60 tablet 5   denosumab  (XGEVA ) 120 MG/1.7ML SOLN injection Inject 120 mg into the skin  every 30 (thirty) days. (Patient taking differently: Inject 120 mg into the skin every 6 (six) weeks. e)     fluticasone (FLONASE) 50 MCG/ACT nasal spray Place 2 sprays into both nostrils daily. (Patient taking differently: Place 2 sprays into both nostrils as needed.)     furosemide  (LASIX ) 20 MG tablet Take 1 tablet (20 mg total) by mouth daily as needed for edema (Take daily as needed for edema). 30 tablet 2   HYDROcodone -acetaminophen  (NORCO/VICODIN) 5-325 MG tablet Take 1 tablet by mouth every 8 (eight) hours as needed for moderate pain (pain score 4-6). 90 tablet 0   lansoprazole  (PREVACID ) 30 MG capsule Take 1 capsule (30 mg total) by mouth 2 (two) times daily. TAKE 1 CAPSULE BY MOUTH IN THE MORNING AND AT BEDTIME 180 capsule 3   lidocaine -prilocaine  (EMLA ) cream Apply 1 Application topically as needed (Apply to port site prior to port access). 30 g 6   linaclotide  (LINZESS ) 72 MCG capsule Take 1 capsule (72 mcg total) by mouth daily before breakfast. (Patient taking differently: Take 72 mcg by mouth. Three times per week.) 90 capsule 3   Misc. Devices MISC Please provide patient with left arm compression sleeve. Diagnosis metastatic breast cancer, bilateral mastectomy, left arm lymphedema 1 each 6   Misc. Devices MISC Please provide with prosthetic mastectomy bra 1 each 6   pregabalin  (LYRICA ) 25 MG capsule Take 1 capsule (25 mg total) by mouth 2 (two) times daily. As needed. 60 capsule 3   prochlorperazine  (COMPAZINE ) 10 MG tablet Take 1 tablet (10 mg total) by mouth every 6 (six) hours as needed for nausea or vomiting. 60 tablet 2   RESTASIS 0.05 % ophthalmic emulsion 1 drop 2 (two) times daily.     No current facility-administered medications for this visit.    VITALS:  Last menstrual period 06/08/2012.  Wt Readings from Last 3 Encounters:  12/27/24 167 lb (75.8 kg)  12/22/24 169 lb 3.2 oz (  76.7 kg)  12/20/24 170 lb 3.2 oz (77.2 kg)    There is no height or weight on file to  calculate BMI.  Performance status (ECOG): 1 - Symptomatic but completely ambulatory  PHYSICAL EXAM:   GENERAL:alert, no distress and comfortable SKIN: skin color, texture, turgor are normal, no rashes or significant lesions LYMPH:  no palpable lymphadenopathy in the cervical, axillary or inguinal LUNGS: clear to auscultation and percussion with normal breathing effort HEART: regular rate & rhythm and no murmurs and no lower extremity edema ABDOMEN:abdomen soft, non-tender and normal bowel sounds Musculoskeletal:no cyanosis of digits and no clubbing  NEURO: alert & oriented x 3 with fluent speech  LABORATORY DATA:  I have reviewed the data as listed    Component Value Date/Time   NA 141 12/22/2024 0937   K 4.2 12/22/2024 0937   CL 107 12/22/2024 0937   CO2 21 (L) 12/22/2024 0937   GLUCOSE 95 12/22/2024 0937   BUN 15 12/22/2024 0937   CREATININE 0.66 12/22/2024 0937   CALCIUM 9.5 12/22/2024 0937   PROT 6.7 12/22/2024 0937   ALBUMIN 4.1 12/22/2024 0937   AST 36 12/22/2024 0937   ALT 36 12/22/2024 0937   ALKPHOS 78 12/22/2024 0937   BILITOT 0.2 12/22/2024 0937   GFRNONAA >60 12/22/2024 0937   GFRAA >90 06/08/2012 1251    Lab Results  Component Value Date   WBC 4.1 12/22/2024   NEUTROABS 2.3 12/22/2024   HGB 13.6 12/22/2024   HCT 39.8 12/22/2024   MCV 97.3 12/22/2024   PLT 330 12/22/2024      Chemistry      Component Value Date/Time   NA 141 12/22/2024 0937   K 4.2 12/22/2024 0937   CL 107 12/22/2024 0937   CO2 21 (L) 12/22/2024 0937   BUN 15 12/22/2024 0937   CREATININE 0.66 12/22/2024 0937      Component Value Date/Time   CALCIUM 9.5 12/22/2024 0937   ALKPHOS 78 12/22/2024 0937   AST 36 12/22/2024 0937   ALT 36 12/22/2024 0937   BILITOT 0.2 12/22/2024 0937      Latest Reference Range & Units 08/18/24 09:08  Iron 28 - 170 ug/dL 76  UIBC ug/dL 682  TIBC 749 - 549 ug/dL 606  Saturation Ratios 10.4 - 31.8 % 19  Ferritin 11 - 307 ng/mL 37  Folate >5.9  ng/mL 17.0  Vitamin B12 180 - 914 pg/mL 654    Latest Reference Range & Units 12/22/24 09:37  CA 15-3 0.0 - 25.0 U/mL 33.0 (H)  CA 27.29 0.0 - 38.6 U/mL 36.7  (H): Data is abnormally high  RADIOGRAPHIC STUDIES: I have personally reviewed the radiological report as listed below  NM PET Image Restag (PS) Skull Base To Thigh EXAM: PET AND CT SKULL BASE TO MID THIGH 01/06/2025 03:54:15 PM  TECHNIQUE:  RADIOPHARMACEUTICAL: 8.56 mCi F-18 FDG Uptake time 60 minutes. Glucose level 94 mg/dl.  PET imaging was acquired from the base of the skull to the mid thighs. Non-contrast enhanced computed tomography was obtained for attenuation correction and anatomic localization. Blood pool SUV 2.6.  COMPARISON: 09/23/2024  CLINICAL HISTORY: Breast cancer restaging.  FINDINGS:  HEAD AND NECK: No metabolically active cervical lymphadenopathy. Chronic left maxillary sinusitis. Bilateral common carotid atheromatous vascular calcifications.  CHEST: No metabolically active pulmonary nodules. No metabolically active lymphadenopathy. Right port-a-cath tip and lower SVC. Coronary and aortic atherosclerotic vascular calcifications.  ABDOMEN AND PELVIS: No metabolically active intraperitoneal mass. No metabolically active lymphadenopathy. Physiologic activity within  the gastrointestinal and genitourinary systems. Newly perceptible hypermetabolic lesion in segment 6 of the liver measuring about 1.8 cm in diameter with maximum SUV 7.6. Other smaller hypermetabolic liver lesions are likewise compatible with malignancy. Index sclerotic lesion in the right ischial tuberosity has a maximum SUV of 10.4, formerly 6.3. Systemic atherosclerosis is present, including the aorta and iliac arteries.  BONES AND SOFT TISSUE: Similar distribution of mildly accentuated uptake in the left posterolateral 6th rib, maximum SUV 3.5 and previously 2.7. Faint cortical sclerosis noted in this region. A variety of  the sclerotic lesions in the skeleton remain without significant accentuated metabolic activity. However, some lesions demonstrate new hypermetabolic activity. For example, the index sclerotic lesion centrally in the T12 vertebral body has a maximum SUV of 6.8, previously 2.9. Index sclerotic lesion in the right ischial tuberosity has a maximum SUV of 10.4, formerly 6.3. In general the widespread scattered sclerotic lesions appear otherwise unchanged on the CT data. Prior mastectomies.  IMPRESSION: 1. New hypermetabolic liver lesions, including a 1.8 cm lesion in segment 6 of the liver with maximum SUV 7.6, compatible with malignancy. 2. Progression of osseous metastatic disease with new hypermetabolic activity in a few pre-existing sclerotic lesions, including an index lesion centrally in the T12 vertebral body (maximum SUV 6.8, previously 2.9) and an index lesion in the right ischial tuberosity (maximum SUV 10.4, formerly 6.3), with the widespread scattered sclerotic lesions otherwise generally unchanged on CT. 3. Additional chronic and incidental findings include chronic left maxillary sinusitis, systemic atherosclerosis (including coronary, aortic, carotid, and iliac calcifications), prior mastectomies, and a right port catheter with tip in the lower SVC.  Electronically signed by: Ryan Salvage MD MD 01/11/2025 12:57 PM EST RP Workstation: [id number]    "

## 2025-01-13 LAB — CANCER ANTIGEN 15-3: CA 15-3: 33.8 U/mL — ABNORMAL HIGH (ref 0.0–25.0)

## 2025-01-13 LAB — CANCER ANTIGEN 27.29: CA 27.29: 43.5 U/mL — ABNORMAL HIGH (ref 0.0–38.6)

## 2025-01-18 ENCOUNTER — Ambulatory Visit (HOSPITAL_COMMUNITY)
Admission: RE | Admit: 2025-01-18 | Discharge: 2025-01-18 | Disposition: A | Discharge status: Home / Self Care | Source: Ambulatory Visit | Attending: Oncology | Admitting: Oncology

## 2025-01-18 DIAGNOSIS — Z79899 Other long term (current) drug therapy: Secondary | ICD-10-CM | POA: Insufficient documentation

## 2025-01-18 LAB — ECHOCARDIOGRAM COMPLETE
AR max vel: 2.68 cm2
AV Area VTI: 2.65 cm2
AV Area mean vel: 2.67 cm2
AV Mean grad: 4 mmHg
AV Peak grad: 6.6 mmHg
Ao pk vel: 1.28 m/s
Area-P 1/2: 4.31 cm2
Calc EF: 60.6 %
S' Lateral: 2.9 cm
Single Plane A2C EF: 59.6 %
Single Plane A4C EF: 63.2 %

## 2025-01-18 NOTE — Progress Notes (Signed)
*  PRELIMINARY RESULTS* Echocardiogram 2D Echocardiogram has been performed.  Wendy Zamora 01/18/2025, 12:21 PM

## 2025-01-19 ENCOUNTER — Inpatient Hospital Stay

## 2025-01-19 VITALS — BP 114/74 | HR 85 | Temp 96.7°F | Resp 18

## 2025-01-19 DIAGNOSIS — Z5112 Encounter for antineoplastic immunotherapy: Secondary | ICD-10-CM | POA: Diagnosis not present

## 2025-01-19 DIAGNOSIS — C50919 Malignant neoplasm of unspecified site of unspecified female breast: Secondary | ICD-10-CM

## 2025-01-19 LAB — COMPREHENSIVE METABOLIC PANEL WITH GFR
ALT: 31 U/L (ref 0–44)
AST: 33 U/L (ref 15–41)
Albumin: 4.1 g/dL (ref 3.5–5.0)
Alkaline Phosphatase: 75 U/L (ref 38–126)
Anion gap: 11 (ref 5–15)
BUN: 14 mg/dL (ref 8–23)
CO2: 25 mmol/L (ref 22–32)
Calcium: 9.7 mg/dL (ref 8.9–10.3)
Chloride: 106 mmol/L (ref 98–111)
Creatinine, Ser: 0.76 mg/dL (ref 0.44–1.00)
GFR, Estimated: >60 mL/min
Glucose, Bld: 103 mg/dL — ABNORMAL HIGH (ref 70–99)
Potassium: 3.7 mmol/L (ref 3.5–5.1)
Sodium: 142 mmol/L (ref 135–145)
Total Bilirubin: 0.4 mg/dL (ref 0.0–1.2)
Total Protein: 6.7 g/dL (ref 6.5–8.1)

## 2025-01-19 LAB — CBC WITH DIFFERENTIAL/PLATELET
Abs Immature Granulocytes: 0.01 K/uL (ref 0.00–0.07)
Basophils Absolute: 0.1 K/uL (ref 0.0–0.1)
Basophils Relative: 1 %
Eosinophils Absolute: 0.1 K/uL (ref 0.0–0.5)
Eosinophils Relative: 2 %
HCT: 39.7 % (ref 36.0–46.0)
Hemoglobin: 13.5 g/dL (ref 12.0–15.0)
Immature Granulocytes: 0 %
Lymphocytes Relative: 23 %
Lymphs Abs: 1.3 K/uL (ref 0.7–4.0)
MCH: 33 pg (ref 26.0–34.0)
MCHC: 34 g/dL (ref 30.0–36.0)
MCV: 97.1 fL (ref 80.0–100.0)
Monocytes Absolute: 0.5 K/uL (ref 0.1–1.0)
Monocytes Relative: 10 %
Neutro Abs: 3.5 K/uL (ref 1.7–7.7)
Neutrophils Relative %: 64 %
Platelets: 336 K/uL (ref 150–400)
RBC: 4.09 MIL/uL (ref 3.87–5.11)
RDW: 14.9 % (ref 11.5–15.5)
WBC: 5.5 K/uL (ref 4.0–10.5)
nRBC: 0 % (ref 0.0–0.2)

## 2025-01-19 LAB — PHOSPHORUS: Phosphorus: 2.1 mg/dL — ABNORMAL LOW (ref 2.5–4.6)

## 2025-01-19 LAB — MAGNESIUM: Magnesium: 2.2 mg/dL (ref 1.7–2.4)

## 2025-01-19 MED ORDER — DEXAMETHASONE 4 MG PO TABS: ORAL_TABLET | ORAL | 1 refills | Status: AC

## 2025-01-19 MED ORDER — K PHOS MONO-SOD PHOS DI & MONO 155-852-130 MG PO TABS
500.0000 mg | ORAL_TABLET | Freq: Once | ORAL | Status: AC
  Administered 2025-01-19: 500 mg via ORAL
  Filled 2025-01-19 (×2): qty 2

## 2025-01-19 MED ORDER — DEXAMETHASONE SOD PHOSPHATE PF 10 MG/ML IJ SOLN
10.0000 mg | Freq: Once | INTRAMUSCULAR | Status: AC
  Administered 2025-01-19: 10 mg via INTRAVENOUS
  Filled 2025-01-19: qty 1

## 2025-01-19 MED ORDER — SODIUM CHLORIDE 0.9 % IV SOLN
150.0000 mg | Freq: Once | INTRAVENOUS | Status: AC
  Administered 2025-01-19: 150 mg via INTRAVENOUS
  Filled 2025-01-19: qty 150

## 2025-01-19 MED ORDER — SODIUM CHLORIDE 0.9 % IV SOLN
10.0000 mg/kg | Freq: Once | INTRAVENOUS | Status: AC
  Administered 2025-01-19: 720 mg via INTRAVENOUS
  Filled 2025-01-19: qty 72

## 2025-01-19 MED ORDER — PALONOSETRON HCL INJECTION 0.25 MG/5ML
0.2500 mg | Freq: Once | INTRAVENOUS | Status: AC
  Administered 2025-01-19: 0.25 mg via INTRAVENOUS
  Filled 2025-01-19: qty 5

## 2025-01-19 MED ORDER — LOPERAMIDE HCL 2 MG PO CAPS: ORAL_CAPSULE | ORAL | 3 refills | Status: AC

## 2025-01-19 MED ORDER — LIDOCAINE-PRILOCAINE 2.5-2.5 % EX CREA: TOPICAL_CREAM | CUTANEOUS | 3 refills | Status: AC

## 2025-01-19 MED ORDER — ONDANSETRON HCL 8 MG PO TABS: 8.0000 mg | ORAL_TABLET | Freq: Three times a day (TID) | ORAL | 1 refills | Status: AC | PRN

## 2025-01-19 MED ORDER — SODIUM CHLORIDE 0.9 % IV SOLN: INTRAVENOUS | Status: DC

## 2025-01-19 MED ORDER — ACETAMINOPHEN 325 MG PO TABS
650.0000 mg | ORAL_TABLET | Freq: Once | ORAL | Status: AC
  Administered 2025-01-19: 650 mg via ORAL
  Filled 2025-01-19: qty 2

## 2025-01-19 MED ORDER — FAMOTIDINE IN NACL 20-0.9 MG/50ML-% IV SOLN
20.0000 mg | Freq: Once | INTRAVENOUS | Status: AC
  Administered 2025-01-19: 20 mg via INTRAVENOUS
  Filled 2025-01-19: qty 50

## 2025-01-19 MED ORDER — DIPHENHYDRAMINE HCL 50 MG/ML IJ SOLN
50.0000 mg | Freq: Once | INTRAMUSCULAR | Status: AC
  Administered 2025-01-19: 50 mg via INTRAVENOUS
  Filled 2025-01-19: qty 1

## 2025-01-19 MED ORDER — PROCHLORPERAZINE MALEATE 10 MG PO TABS: 10.0000 mg | ORAL_TABLET | Freq: Four times a day (QID) | ORAL | 1 refills | Status: AC | PRN

## 2025-01-19 NOTE — Patient Instructions (Signed)
 [facility] [place] - A DEPT OF [facility]  Discharge Instructions: Thank you for choosing [facility] to provide your oncology and hematology care.  If you have a lab appointment with the Cancer Center - please note that after April 8th, 2024, all labs will be drawn in the cancer center.  You do not have to check in or register with the main entrance as you have in the past but will complete your check-in in the cancer center.  Wear comfortable clothing and clothing appropriate for easy access to any Portacath or PICC line.   We strive to give you quality time with your provider. You may need to reschedule your appointment if you arrive late (15 or more minutes).  Arriving late affects you and other patients whose appointments are after yours.  Also, if you miss three or more appointments without notifying the office, you may be dismissed from the clinic at the provider's discretion.      For prescription refill requests, have your pharmacy contact our office and allow 72 hours for refills to be completed.    Today you received the following chemotherapy and/or immunotherapy agents Wendy Zamora   To help prevent nausea and vomiting after your treatment, we encourage you to take your nausea medication as directed.  BELOW ARE SYMPTOMS THAT SHOULD BE REPORTED IMMEDIATELY: *FEVER GREATER THAN 100.4 F (38 C) OR HIGHER *CHILLS OR SWEATING *NAUSEA AND VOMITING THAT IS NOT CONTROLLED WITH YOUR NAUSEA MEDICATION *UNUSUAL SHORTNESS OF BREATH *UNUSUAL BRUISING OR BLEEDING *URINARY PROBLEMS (pain or burning when urinating, or frequent urination) *BOWEL PROBLEMS (unusual diarrhea, constipation, pain near the anus) TENDERNESS IN MOUTH AND THROAT WITH OR WITHOUT PRESENCE OF ULCERS (sore throat, sores in mouth, or a toothache) UNUSUAL RASH, SWELLING OR PAIN  UNUSUAL VAGINAL DISCHARGE OR ITCHING   Items with * indicate a potential emergency and should be followed up as  soon as possible or go to the Emergency Department if any problems should occur.  Please show the CHEMOTHERAPY ALERT CARD or IMMUNOTHERAPY ALERT CARD at check-in to the Emergency Department and triage nurse.  Should you have questions after your visit or need to cancel or reschedule your appointment, please contact [facility] [place] - A DEPT OF [facility] [phone]  and follow the prompts.  Office hours are 8:00 a.m. to 4:30 p.m. Monday - Friday. Please note that voicemails left after 4:00 p.m. may not be returned until the following business day.  We are closed weekends and major holidays. You have access to a nurse at all times for urgent questions. Please call the main number to the clinic [phone] and follow the prompts.  For any non-urgent questions, you may also contact your provider using MyChart. We now offer e-Visits for anyone 15 and older to request care online for non-urgent symptoms. For details visit [email or web address].   Also download the MyChart app! Go to the app store, search "MyChart", open the app, select [facility], and log in with your MyChart username and password.

## 2025-01-19 NOTE — Progress Notes (Signed)
 Order entered for K-Phos 500mg  per Dr. Davonna for phos=2.1.  Reymundo Winship, PharmD, MBA

## 2025-01-19 NOTE — Progress Notes (Signed)
 Patient presents today for D1C1 Trodelvy  infusion per providers order.  Vital signs and labs within parameters for treatment.  Phosphorus noted to be 2.1, MD notified.  Patient complains of a small red bump to her right hand wrist area and a small reddened area to the chest and neck.  None are raised, no open areas.  Patient states that she will call the clinic if it worsens.  Treatment given today per MD orders.  Stable during infusion without adverse affects.  Vital signs stable.  No complaints at this time.  Discharge from clinic ambulatory in stable condition.  Alert and oriented X 3.  Follow up with [facility] as scheduled.

## 2025-01-20 ENCOUNTER — Inpatient Hospital Stay

## 2025-01-20 LAB — CANCER ANTIGEN 15-3: CA 15-3: 30.4 U/mL — ABNORMAL HIGH (ref 0.0–25.0)

## 2025-01-20 LAB — CANCER ANTIGEN 27.29: CA 27.29: 33.6 U/mL (ref 0.0–38.6)

## 2025-01-20 NOTE — Telephone Encounter (Addendum)
 Patient called stating that she started a new chemotherapy and she gets very sick and nauseous. Patient is wanting to cancel her EGD/BRAVO for now and reschedule after she gets a feel for this new chemo that she is on. Patient states she will call back to reschedule. Sending message to endo.

## 2025-01-20 NOTE — Progress Notes (Signed)
 24 hour call back. Trodelvy . Patient denies any vomiting, nausea or fatigue. Patient experienced 1 episode of loose bowel movement that resolved on it's own without Imodium . Patient teaching pertaining to Dexamethasone  administration. Understanding verbalized. All questions and concerns were addressed.

## 2025-01-21 ENCOUNTER — Other Ambulatory Visit: Payer: Self-pay | Admitting: Oncology

## 2025-01-21 DIAGNOSIS — C50919 Malignant neoplasm of unspecified site of unspecified female breast: Secondary | ICD-10-CM

## 2025-01-24 ENCOUNTER — Encounter (HOSPITAL_COMMUNITY): Admission: RE | Admit: 2025-01-24 | Source: Ambulatory Visit

## 2025-01-26 ENCOUNTER — Inpatient Hospital Stay

## 2025-01-27 ENCOUNTER — Encounter (HOSPITAL_COMMUNITY): Payer: Self-pay

## 2025-01-27 ENCOUNTER — Inpatient Hospital Stay

## 2025-01-27 ENCOUNTER — Ambulatory Visit (HOSPITAL_COMMUNITY): Admit: 2025-01-27 | Admitting: Gastroenterology

## 2025-01-27 SURGERY — EGD (ESOPHAGOGASTRODUODENOSCOPY)
Anesthesia: Choice

## 2025-01-28 ENCOUNTER — Inpatient Hospital Stay

## 2025-02-02 ENCOUNTER — Inpatient Hospital Stay

## 2025-02-09 ENCOUNTER — Inpatient Hospital Stay

## 2025-02-09 ENCOUNTER — Inpatient Hospital Stay: Attending: Hematology

## 2025-02-09 ENCOUNTER — Inpatient Hospital Stay: Admitting: Oncology

## 2025-02-10 ENCOUNTER — Inpatient Hospital Stay: Admitting: Oncology

## 2025-02-10 ENCOUNTER — Inpatient Hospital Stay

## 2025-02-16 ENCOUNTER — Inpatient Hospital Stay

## 2025-02-17 ENCOUNTER — Inpatient Hospital Stay

## 2025-02-18 ENCOUNTER — Inpatient Hospital Stay

## 2025-03-02 ENCOUNTER — Inpatient Hospital Stay: Admitting: Oncology

## 2025-03-02 ENCOUNTER — Inpatient Hospital Stay: Attending: Hematology

## 2025-03-02 ENCOUNTER — Inpatient Hospital Stay

## 2025-03-09 ENCOUNTER — Inpatient Hospital Stay

## 2025-03-11 ENCOUNTER — Inpatient Hospital Stay

## 2025-06-28 ENCOUNTER — Ambulatory Visit: Payer: Self-pay
